# Patient Record
Sex: Female | Born: 1940
Health system: Southern US, Community
[De-identification: ages and names within clinical notes are randomized; demographics above are authoritative.]

## PROBLEM LIST (undated history)

## (undated) DIAGNOSIS — D649 Anemia, unspecified: Secondary | ICD-10-CM

## (undated) DIAGNOSIS — I639 Cerebral infarction, unspecified: Secondary | ICD-10-CM

## (undated) DIAGNOSIS — G2581 Restless legs syndrome: Secondary | ICD-10-CM

## (undated) DIAGNOSIS — N83209 Unspecified ovarian cyst, unspecified side: Secondary | ICD-10-CM

## (undated) DIAGNOSIS — I499 Cardiac arrhythmia, unspecified: Secondary | ICD-10-CM

## (undated) DIAGNOSIS — C50919 Malignant neoplasm of unspecified site of unspecified female breast: Secondary | ICD-10-CM

## (undated) DIAGNOSIS — H469 Unspecified optic neuritis: Secondary | ICD-10-CM

## (undated) DIAGNOSIS — S329XXA Fracture of unspecified parts of lumbosacral spine and pelvis, initial encounter for closed fracture: Secondary | ICD-10-CM

## (undated) DIAGNOSIS — Z8679 Personal history of other diseases of the circulatory system: Secondary | ICD-10-CM

## (undated) DIAGNOSIS — K219 Gastro-esophageal reflux disease without esophagitis: Secondary | ICD-10-CM

## (undated) DIAGNOSIS — E785 Hyperlipidemia, unspecified: Secondary | ICD-10-CM

## (undated) DIAGNOSIS — Z8719 Personal history of other diseases of the digestive system: Secondary | ICD-10-CM

## (undated) DIAGNOSIS — K449 Diaphragmatic hernia without obstruction or gangrene: Secondary | ICD-10-CM

## (undated) DIAGNOSIS — G629 Polyneuropathy, unspecified: Secondary | ICD-10-CM

## (undated) DIAGNOSIS — F329 Major depressive disorder, single episode, unspecified: Secondary | ICD-10-CM

## (undated) DIAGNOSIS — F32A Depression, unspecified: Secondary | ICD-10-CM

## (undated) DIAGNOSIS — G4762 Sleep related leg cramps: Secondary | ICD-10-CM

## (undated) DIAGNOSIS — D490 Neoplasm of unspecified behavior of digestive system: Secondary | ICD-10-CM

## (undated) DIAGNOSIS — I679 Cerebrovascular disease, unspecified: Secondary | ICD-10-CM

## (undated) DIAGNOSIS — M8580 Other specified disorders of bone density and structure, unspecified site: Secondary | ICD-10-CM

## (undated) DIAGNOSIS — B029 Zoster without complications: Secondary | ICD-10-CM

## (undated) HISTORY — DX: Hyperlipidemia, unspecified: E78.5

## (undated) HISTORY — DX: Personal history of other diseases of the circulatory system: Z86.79

## (undated) HISTORY — DX: Gastro-esophageal reflux disease without esophagitis: K21.9

## (undated) HISTORY — DX: Malignant neoplasm of unspecified site of unspecified female breast: C50.919

## (undated) HISTORY — PX: CATARACT EXTRACTION: SUR2

## (undated) HISTORY — DX: Major depressive disorder, single episode, unspecified: F32.9

## (undated) HISTORY — DX: Depression, unspecified: F32.A

## (undated) HISTORY — DX: Unspecified optic neuritis: H46.9

## (undated) HISTORY — DX: Anemia, unspecified: D64.9

## (undated) HISTORY — DX: Cardiac arrhythmia, unspecified: I49.9

## (undated) HISTORY — DX: Polyneuropathy, unspecified: G62.9

## (undated) HISTORY — DX: Cerebral infarction, unspecified: I63.9

## (undated) HISTORY — PX: APPENDECTOMY: SHX54

## (undated) HISTORY — DX: Neoplasm of unspecified behavior of digestive system: D49.0

## (undated) HISTORY — DX: Restless legs syndrome: G25.81

## (undated) HISTORY — DX: Fracture of unspecified parts of lumbosacral spine and pelvis, initial encounter for closed fracture: S32.9XXA

## (undated) HISTORY — PX: BREAST LUMPECTOMY: SHX2

## (undated) HISTORY — DX: Diaphragmatic hernia without obstruction or gangrene: K44.9

## (undated) HISTORY — DX: Unspecified ovarian cyst, unspecified side: N83.209

## (undated) HISTORY — DX: Personal history of other diseases of the digestive system: Z87.19

## (undated) HISTORY — PX: SPINE SURGERY: SHX786

## (undated) HISTORY — PX: ABDOMINAL HYSTERECTOMY: SHX81

## (undated) HISTORY — DX: Zoster without complications: B02.9

## (undated) HISTORY — PX: TOTAL HIP ARTHROPLASTY: SHX124

## (undated) HISTORY — DX: Other specified disorders of bone density and structure, unspecified site: M85.80

## (undated) HISTORY — DX: Sleep related leg cramps: G47.62

## (undated) HISTORY — PX: MASTECTOMY: SHX3

---
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
## 1998-05-01 DIAGNOSIS — B029 Zoster without complications: Secondary | ICD-10-CM

## 1998-05-01 HISTORY — DX: Zoster without complications: B02.9

## 1999-04-18 ENCOUNTER — Encounter (INDEPENDENT_AMBULATORY_CARE_PROVIDER_SITE_OTHER): Payer: Self-pay

## 1999-04-18 ENCOUNTER — Other Ambulatory Visit: Admission: RE | Admit: 1999-04-18 | Discharge: 1999-04-18 | Payer: Self-pay | Admitting: Radiology

## 1999-04-22 ENCOUNTER — Encounter: Payer: Self-pay | Admitting: General Surgery

## 1999-04-27 ENCOUNTER — Inpatient Hospital Stay (HOSPITAL_COMMUNITY): Admission: RE | Admit: 1999-04-27 | Discharge: 1999-04-29 | Payer: Self-pay | Admitting: General Surgery

## 1999-04-27 ENCOUNTER — Encounter (INDEPENDENT_AMBULATORY_CARE_PROVIDER_SITE_OTHER): Payer: Self-pay | Admitting: *Deleted

## 1999-07-08 ENCOUNTER — Ambulatory Visit (HOSPITAL_BASED_OUTPATIENT_CLINIC_OR_DEPARTMENT_OTHER): Admission: RE | Admit: 1999-07-08 | Discharge: 1999-07-08 | Payer: Self-pay | Admitting: Plastic Surgery

## 2000-03-15 ENCOUNTER — Ambulatory Visit (HOSPITAL_BASED_OUTPATIENT_CLINIC_OR_DEPARTMENT_OTHER): Admission: RE | Admit: 2000-03-15 | Discharge: 2000-03-15 | Payer: Self-pay | Admitting: General Surgery

## 2000-03-15 ENCOUNTER — Encounter (INDEPENDENT_AMBULATORY_CARE_PROVIDER_SITE_OTHER): Payer: Self-pay | Admitting: Specialist

## 2000-04-04 ENCOUNTER — Encounter: Payer: Self-pay | Admitting: Family Medicine

## 2000-04-04 ENCOUNTER — Ambulatory Visit (HOSPITAL_COMMUNITY): Admission: RE | Admit: 2000-04-04 | Discharge: 2000-04-04 | Payer: Self-pay | Admitting: Family Medicine

## 2000-04-17 ENCOUNTER — Encounter: Payer: Self-pay | Admitting: Oncology

## 2000-04-17 ENCOUNTER — Encounter: Admission: RE | Admit: 2000-04-17 | Discharge: 2000-04-17 | Payer: Self-pay | Admitting: Oncology

## 2000-04-20 ENCOUNTER — Encounter: Admission: RE | Admit: 2000-04-20 | Discharge: 2000-04-20 | Payer: Self-pay | Admitting: Oncology

## 2000-04-20 ENCOUNTER — Encounter: Payer: Self-pay | Admitting: Oncology

## 2000-07-23 ENCOUNTER — Encounter: Payer: Self-pay | Admitting: Emergency Medicine

## 2000-07-23 ENCOUNTER — Emergency Department (HOSPITAL_COMMUNITY): Admission: EM | Admit: 2000-07-23 | Discharge: 2000-07-23 | Payer: Self-pay | Admitting: Emergency Medicine

## 2000-08-24 ENCOUNTER — Encounter: Payer: Self-pay | Admitting: Oncology

## 2000-08-24 ENCOUNTER — Ambulatory Visit (HOSPITAL_COMMUNITY): Admission: RE | Admit: 2000-08-24 | Discharge: 2000-08-24 | Payer: Self-pay | Admitting: Oncology

## 2002-02-26 ENCOUNTER — Encounter: Payer: Self-pay | Admitting: Gastroenterology

## 2004-02-12 ENCOUNTER — Encounter: Admission: RE | Admit: 2004-02-12 | Discharge: 2004-02-12 | Payer: Self-pay | Admitting: General Surgery

## 2004-03-25 ENCOUNTER — Ambulatory Visit: Payer: Self-pay | Admitting: Family Medicine

## 2004-04-04 ENCOUNTER — Ambulatory Visit: Payer: Self-pay | Admitting: Family Medicine

## 2004-09-19 ENCOUNTER — Ambulatory Visit: Payer: Self-pay | Admitting: Family Medicine

## 2004-12-02 ENCOUNTER — Encounter: Admission: RE | Admit: 2004-12-02 | Discharge: 2004-12-02 | Payer: Self-pay | Admitting: Orthopedic Surgery

## 2005-04-28 ENCOUNTER — Encounter: Admission: RE | Admit: 2005-04-28 | Discharge: 2005-04-28 | Payer: Self-pay | Admitting: General Surgery

## 2005-04-28 ENCOUNTER — Encounter (INDEPENDENT_AMBULATORY_CARE_PROVIDER_SITE_OTHER): Payer: Self-pay | Admitting: Specialist

## 2006-04-18 ENCOUNTER — Ambulatory Visit: Payer: Self-pay | Admitting: Family Medicine

## 2006-04-30 ENCOUNTER — Ambulatory Visit (HOSPITAL_COMMUNITY): Admission: RE | Admit: 2006-04-30 | Discharge: 2006-04-30 | Payer: Self-pay | Admitting: Family Medicine

## 2006-05-21 ENCOUNTER — Ambulatory Visit: Payer: Self-pay | Admitting: Family Medicine

## 2006-05-21 LAB — CONVERTED CEMR LAB
Albumin: 3.8 g/dL (ref 3.5–5.2)
BUN: 14 mg/dL (ref 6–23)
Basophils Absolute: 0.1 10*3/uL (ref 0.0–0.1)
CO2: 31 meq/L (ref 19–32)
Chloride: 104 meq/L (ref 96–112)
Cholesterol: 270 mg/dL (ref 0–200)
Creatinine, Ser: 0.8 mg/dL (ref 0.4–1.2)
Direct LDL: 163 mg/dL
Eosinophils Relative: 4.5 % (ref 0.0–5.0)
HDL: 52.2 mg/dL (ref >39.0)
Hemoglobin: 14.2 g/dL (ref 12.0–15.0)
MCHC: 35.1 g/dL (ref 30.0–36.0)
MCV: 89.4 fL (ref 78.0–100.0)
Monocytes Absolute: 0.5 10*3/uL (ref 0.2–0.7)
Monocytes Relative: 8.1 % (ref 3.0–11.0)
Neutro Abs: 2.9 10*3/uL (ref 1.4–7.7)
Neutrophils Relative %: 46.2 % (ref 43.0–77.0)
RDW: 12.8 % (ref 11.5–14.6)
Sodium: 141 meq/L (ref 135–145)
Total Bilirubin: 0.8 mg/dL (ref 0.3–1.2)
Triglycerides: 245 mg/dL (ref 0–149)
VLDL: 49 mg/dL — ABNORMAL HIGH (ref 0–40)

## 2006-05-28 ENCOUNTER — Ambulatory Visit: Payer: Self-pay | Admitting: Family Medicine

## 2006-05-31 ENCOUNTER — Encounter: Admission: RE | Admit: 2006-05-31 | Discharge: 2006-07-20 | Payer: Self-pay | Admitting: Neurology

## 2006-07-24 ENCOUNTER — Ambulatory Visit: Payer: Self-pay | Admitting: Family Medicine

## 2006-07-24 LAB — CONVERTED CEMR LAB
Direct LDL: 136.4 mg/dL
Total CHOL/HDL Ratio: 3.8
Triglycerides: 164 mg/dL — ABNORMAL HIGH (ref 0–149)
VLDL: 33 mg/dL (ref 0–40)

## 2007-01-14 ENCOUNTER — Telehealth: Payer: Self-pay | Admitting: Family Medicine

## 2007-02-21 DIAGNOSIS — J157 Pneumonia due to Mycoplasma pneumoniae: Secondary | ICD-10-CM

## 2007-03-07 ENCOUNTER — Ambulatory Visit: Payer: Self-pay | Admitting: Family Medicine

## 2007-03-07 DIAGNOSIS — K219 Gastro-esophageal reflux disease without esophagitis: Secondary | ICD-10-CM

## 2007-03-07 DIAGNOSIS — F329 Major depressive disorder, single episode, unspecified: Secondary | ICD-10-CM

## 2007-08-01 ENCOUNTER — Ambulatory Visit: Payer: Self-pay | Admitting: Family Medicine

## 2007-08-01 ENCOUNTER — Telehealth: Payer: Self-pay | Admitting: Family Medicine

## 2007-08-01 DIAGNOSIS — N3000 Acute cystitis without hematuria: Secondary | ICD-10-CM | POA: Insufficient documentation

## 2007-08-01 DIAGNOSIS — Z853 Personal history of malignant neoplasm of breast: Secondary | ICD-10-CM

## 2007-08-01 LAB — CONVERTED CEMR LAB
Bilirubin Urine: NEGATIVE
Glucose, Urine, Semiquant: NEGATIVE
Protein, U semiquant: NEGATIVE
Specific Gravity, Urine: <1.005
pH: 6.5

## 2007-09-05 ENCOUNTER — Ambulatory Visit: Payer: Self-pay | Admitting: Family Medicine

## 2007-09-05 LAB — CONVERTED CEMR LAB
ALT: 20 units/L (ref 0–35)
Basophils Absolute: 0 10*3/uL (ref 0.0–0.1)
Basophils Relative: 0.9 % (ref 0.0–1.0)
Bilirubin Urine: NEGATIVE
CO2: 30 meq/L (ref 19–32)
Calcium: 9.6 mg/dL (ref 8.4–10.5)
Cholesterol: 215 mg/dL (ref 0–200)
Creatinine, Ser: 0.9 mg/dL (ref 0.4–1.2)
Direct LDL: 125.5 mg/dL
GFR calc Af Amer: 81 mL/min
Glucose, Bld: 86 mg/dL (ref 70–99)
Glucose, Urine, Semiquant: NEGATIVE
HCT: 38.4 % (ref 36.0–46.0)
Hemoglobin: 13.2 g/dL (ref 12.0–15.0)
Lymphocytes Relative: 45.5 % (ref 12.0–46.0)
MCHC: 34.4 g/dL (ref 30.0–36.0)
Monocytes Absolute: 0.5 10*3/uL (ref 0.1–1.0)
Monocytes Relative: 9.3 % (ref 3.0–12.0)
Neutro Abs: 2.1 10*3/uL (ref 1.4–7.7)
RBC: 4.14 M/uL (ref 3.87–5.11)
RDW: 12.8 % (ref 11.5–14.6)
Specific Gravity, Urine: 1.015
TSH: 1.54 microintl units/mL (ref 0.35–5.50)
Total Protein: 6.6 g/dL (ref 6.0–8.3)
VLDL: 25 mg/dL (ref 0–40)
WBC Urine, dipstick: NEGATIVE
pH: 5.5

## 2007-09-16 ENCOUNTER — Ambulatory Visit: Payer: Self-pay | Admitting: Family Medicine

## 2007-09-25 ENCOUNTER — Encounter: Payer: Self-pay | Admitting: Family Medicine

## 2007-10-03 ENCOUNTER — Ambulatory Visit: Payer: Self-pay | Admitting: Family Medicine

## 2007-10-05 DIAGNOSIS — D2360 Other benign neoplasm of skin of unspecified upper limb, including shoulder: Secondary | ICD-10-CM

## 2007-10-05 DIAGNOSIS — C44599 Other specified malignant neoplasm of skin of other part of trunk: Secondary | ICD-10-CM

## 2008-04-02 ENCOUNTER — Ambulatory Visit (HOSPITAL_COMMUNITY): Admission: RE | Admit: 2008-04-02 | Discharge: 2008-04-02 | Payer: Self-pay | Admitting: Family Medicine

## 2008-04-28 ENCOUNTER — Telehealth: Payer: Self-pay | Admitting: Family Medicine

## 2008-07-15 ENCOUNTER — Ambulatory Visit (HOSPITAL_COMMUNITY): Admission: RE | Admit: 2008-07-15 | Discharge: 2008-07-15 | Payer: Self-pay | Admitting: Neurology

## 2008-12-21 ENCOUNTER — Ambulatory Visit: Payer: Self-pay | Admitting: Family Medicine

## 2008-12-21 LAB — CONVERTED CEMR LAB
Glucose, Urine, Semiquant: NEGATIVE
Nitrite: NEGATIVE
Specific Gravity, Urine: 1.015
WBC Urine, dipstick: NEGATIVE

## 2009-01-11 ENCOUNTER — Encounter: Payer: Self-pay | Admitting: Family Medicine

## 2009-01-15 ENCOUNTER — Ambulatory Visit: Payer: Self-pay | Admitting: Family Medicine

## 2009-01-15 LAB — CONVERTED CEMR LAB
AST: 17 units/L (ref 0–37)
Albumin: 3.9 g/dL (ref 3.5–5.2)
Alkaline Phosphatase: 70 units/L (ref 39–117)
Basophils Relative: 0.1 % (ref 0.0–3.0)
CO2: 28 meq/L (ref 19–32)
Calcium: 9.2 mg/dL (ref 8.4–10.5)
Chloride: 105 meq/L (ref 96–112)
Eosinophils Absolute: 0.5 10*3/uL (ref 0.0–0.7)
HCT: 36.6 % (ref 36.0–46.0)
Hemoglobin: 12.7 g/dL (ref 12.0–15.0)
Lymphocytes Relative: 45.4 % (ref 12.0–46.0)
MCHC: 34.7 g/dL (ref 30.0–36.0)
Monocytes Relative: 7.6 % (ref 3.0–12.0)
Neutro Abs: 2.4 10*3/uL (ref 1.4–7.7)
Nitrite: NEGATIVE
Potassium: 4.6 meq/L (ref 3.5–5.1)
RBC: 4.02 M/uL (ref 3.87–5.11)
Sodium: 140 meq/L (ref 135–145)
Total CHOL/HDL Ratio: 7
Total Protein: 7.5 g/dL (ref 6.0–8.3)
Triglycerides: 172 mg/dL — ABNORMAL HIGH (ref 0.0–149.0)
Urobilinogen, UA: 0.2

## 2009-01-22 ENCOUNTER — Ambulatory Visit: Payer: Self-pay | Admitting: Family Medicine

## 2009-02-25 ENCOUNTER — Ambulatory Visit: Payer: Self-pay | Admitting: Family Medicine

## 2009-03-01 ENCOUNTER — Telehealth (INDEPENDENT_AMBULATORY_CARE_PROVIDER_SITE_OTHER): Payer: Self-pay | Admitting: *Deleted

## 2009-03-24 ENCOUNTER — Telehealth: Payer: Self-pay | Admitting: Family Medicine

## 2009-07-12 ENCOUNTER — Telehealth: Payer: Self-pay | Admitting: Gastroenterology

## 2009-07-13 ENCOUNTER — Ambulatory Visit: Payer: Self-pay | Admitting: Gastroenterology

## 2009-07-13 ENCOUNTER — Encounter: Payer: Self-pay | Admitting: Gastroenterology

## 2009-07-13 DIAGNOSIS — K5909 Other constipation: Secondary | ICD-10-CM | POA: Insufficient documentation

## 2009-07-13 DIAGNOSIS — R079 Chest pain, unspecified: Secondary | ICD-10-CM | POA: Insufficient documentation

## 2009-07-13 DIAGNOSIS — R1013 Epigastric pain: Secondary | ICD-10-CM

## 2009-07-13 DIAGNOSIS — G609 Hereditary and idiopathic neuropathy, unspecified: Secondary | ICD-10-CM

## 2009-07-13 LAB — CONVERTED CEMR LAB
Albumin: 4.1 g/dL (ref 3.5–5.2)
Basophils Relative: 1.1 % (ref 0.0–3.0)
Eosinophils Absolute: 0.4 10*3/uL (ref 0.0–0.7)
Hemoglobin: 13.5 g/dL (ref 12.0–15.0)
MCHC: 33.8 g/dL (ref 30.0–36.0)
MCV: 89.8 fL (ref 78.0–100.0)
Monocytes Absolute: 0.5 10*3/uL (ref 0.1–1.0)
Neutro Abs: 4.2 10*3/uL (ref 1.4–7.7)
RBC: 4.45 M/uL (ref 3.87–5.11)
Total Protein: 7.4 g/dL (ref 6.0–8.3)

## 2009-07-23 ENCOUNTER — Encounter: Payer: Self-pay | Admitting: Physician Assistant

## 2009-07-26 ENCOUNTER — Ambulatory Visit: Payer: Self-pay | Admitting: Gastroenterology

## 2009-07-26 LAB — CONVERTED CEMR LAB: UREASE: NEGATIVE

## 2009-07-27 ENCOUNTER — Telehealth (INDEPENDENT_AMBULATORY_CARE_PROVIDER_SITE_OTHER): Payer: Self-pay | Admitting: *Deleted

## 2009-07-28 ENCOUNTER — Encounter: Payer: Self-pay | Admitting: Gastroenterology

## 2009-08-19 ENCOUNTER — Encounter: Payer: Self-pay | Admitting: Gastroenterology

## 2009-08-26 ENCOUNTER — Encounter: Admission: RE | Admit: 2009-08-26 | Discharge: 2009-08-26 | Payer: Self-pay | Admitting: Surgery

## 2009-08-29 HISTORY — PX: HERNIA REPAIR: SHX51

## 2009-09-03 ENCOUNTER — Telehealth: Payer: Self-pay | Admitting: Gastroenterology

## 2009-09-07 ENCOUNTER — Inpatient Hospital Stay (HOSPITAL_COMMUNITY): Admission: RE | Admit: 2009-09-07 | Discharge: 2009-09-10 | Payer: Self-pay | Admitting: Surgery

## 2009-09-23 ENCOUNTER — Encounter: Payer: Self-pay | Admitting: Gastroenterology

## 2009-11-08 ENCOUNTER — Ambulatory Visit: Payer: Self-pay | Admitting: Family Medicine

## 2009-11-12 ENCOUNTER — Telehealth: Payer: Self-pay | Admitting: Family Medicine

## 2010-01-13 ENCOUNTER — Telehealth: Payer: Self-pay | Admitting: Family Medicine

## 2010-05-31 NOTE — Letter (Signed)
Summary: [facility] Instructions  [facility]  [address] [place], [place] [address]   Phone: [phone]  Fax: [phone]       Shelley Coleman    Sep 15, 1948    MRN: [id number]        Procedure Day /Date:MONDAY, 07/26/09     Arrival Time:1:30 PM     Procedure Time:2:30 PM     Location of Procedure:                    [facility] (4th Floor)                         PREPARATION FOR COLONOSCOPY WITH MOVIPREP   Starting 5 days prior to your procedure3/23/11do not eat nuts, seeds, popcorn, corn, beans, peas,  salads, or any raw vegetables.  Do not take any fiber supplements (e.g. Metamucil, Citrucel, and Benefiber).  THE DAY BEFORE YOUR PROCEDURE         DATE:07/25/09 TFT:DDUKGU  1.  Drink clear liquids the entire day-NO SOLID FOOD  2.  Do not drink anything colored red or purple.  Avoid juices with pulp.  No orange juice.  3.  Drink at least 64 oz. (8 glasses) of fluid/clear liquids during the day to prevent dehydration and help the prep work efficiently.  CLEAR LIQUIDS INCLUDE: Water Jello Ice Popsicles Tea (sugar ok, no milk/cream) Powdered fruit flavored drinks Coffee (sugar ok, no milk/cream) Gatorade Juice: apple, white grape, white cranberry  Lemonade Clear bullion, consomm, broth Carbonated beverages (any kind) Strained chicken noodle soup Hard Candy                             4.  In the morning, mix first dose of MoviPrep solution:    Empty 1 Pouch A and 1 Pouch B into the disposable container    Add lukewarm drinking water to the top line of the container. Mix to dissolve    Refrigerate (mixed solution should be used within 24 hrs)  5.  Begin drinking the prep at 5:00 p.m. The MoviPrep container is divided by 4 marks.   Every 15 minutes drink the solution down to the next mark (approximately 8 oz) until the full liter is complete.   6.  Follow completed prep with 16 oz of clear liquid of your choice (Nothing red or  purple).  Continue to drink clear liquids until bedtime.  7.  Before going to bed, mix second dose of MoviPrep solution:    Empty 1 Pouch A and 1 Pouch B into the disposable container    Add lukewarm drinking water to the top line of the container. Mix to dissolve    Refrigerate  THE DAY OF YOUR PROCEDURE      DATE: 07/26/09 DAY: MONDAY  Beginning at 9:30 a.m. (5 hours before procedure):         1. Every 15 minutes, drink the solution down to the next mark (approx 8 oz) until the full liter is complete.  2. Follow completed prep with 16 oz. of clear liquid of your choice.    3. You may drink clear liquids until 12:30 PM(2 HOURS BEFORE PROCEDURE).   MEDICATION INSTRUCTIONS  Unless otherwise instructed, you should take regular prescription medications with a small sip of water   as early as possible the morning of your procedure.  OTHER INSTRUCTIONS  You will need a responsible adult at least 68 years of age to accompany you and drive you home.   This person must remain in the waiting room during your procedure.  Wear loose fitting clothing that is easily removed.  Leave jewelry and other valuables at home.  However, you may wish to bring a book to read or  an iPod/MP3 player to listen to music as you wait for your procedure to start.  Remove all body piercing jewelry and leave at home.  Total time from sign-in until discharge is approximately 2-3 hours.  You should go home directly after your procedure and rest.  You can resume normal activities the  day after your procedure.  The day of your procedure you should not:   Drive   Make legal decisions   Operate machinery   Drink alcohol   Return to work  You will receive specific instructions about eating, activities and medications before you leave.    The above instructions have been reviewed and explained to me by   _______________________    I fully understand and can verbalize these  instructions _____________________________ Date _________

## 2010-05-31 NOTE — Procedures (Signed)
Summary: Colonoscopy  Patient: [name] Note: All result statuses are Final unless otherwise noted.  Tests: (1) Colonoscopy (COL)   COL Colonoscopy           DONE     [facility]     [address]     [place], [place]  [address]           COLONOSCOPY PROCEDURE REPORT           PATIENT:  Shelley Coleman, Shelley Coleman  MR#:  [id number]     BIRTHDATE:  06-Mar-1941, 69 yrs. old  GENDER:  female     ENDOSCOPIST:  Vania Rea. Jarold Motto, MD, [facility]     REF. BY:     PROCEDURE DATE:  07/26/2009     PROCEDURE:  Average-risk screening colonoscopy     G0121     ASA CLASS:  Class II     INDICATIONS:  abdominal pain, Routine Risk Screening     MEDICATIONS:   Fentanyl 100 mcg IV, Versed 10 mg IV, Benadryl 12.5     mg IV           DESCRIPTION OF PROCEDURE:   After the risks benefits and     alternatives of the procedure were thoroughly explained, informed     consent was obtained.  Digital rectal exam was performed and     revealed no abnormalities.   The LB [id number]-[id number] endoscope     was introduced through the anus and advanced to the cecum, which     was identified by both the appendix and ileocecal valve, limited     by a redundant colon, poor preparation.    The quality of the prep     was poor, using MoviPrep.  The instrument was then slowly     withdrawn as the colon was fully examined.     <<PROCEDUREIMAGES>>           FINDINGS:  Melanosis coli was found.  This was otherwise a normal     examination of the colon.  No polyps or cancers were seen.     Retroflexed views in the rectum revealed no abnormalities.    The     scope was then withdrawn from the patient and the procedure     completed.           COMPLICATIONS:  None     ENDOSCOPIC IMPRESSION:     1) Melanosis     2) Otherwise normal examination     3) No polyps or cancers     RECOMMENDATIONS:     1) high fiber diet     REPEAT EXAM:  No           ______________________________     Vania Rea. Jarold Motto, MD, Clementeen Graham           CC:   Roderick Pee, MD           n.     Rosalie Doctor:   Vania Rea. Wanette Robison at 07/26/2009 03:16 PM           Sherald Barge, [id number]  Note: An exclamation mark (!) indicates a result that was not dispersed into the flowsheet. Document Creation Date: 07/26/2009 3:17 PM _______________________________________________________________________  (1) Order result status: Final Collection or observation date-time: 07/26/2009 15:11 Requested date-time:  Receipt date-time:  Reported date-time:  Referring Physician:   Ordering Physician: Sheryn Bison [phone]) Specimen Source:  Source: Launa Grill Order Number: [id number] Lab site:

## 2010-05-31 NOTE — Letter (Signed)
Summary: Patient [facility] Biopsy Results  [facility]  [address] [place], [place] [address]   Phone: [phone]  Fax: [phone]        July 28, 2009 MRN: [id number]    [facility] [address] [place], [place]  [address]    Dear Ms. Nary,  I am pleased to inform you that the biopsies taken during your recent endoscopic examination did not show any evidence of cancer upon pathologic examination.  Additional information/recommendations:  __No further action is needed at this time.  Please follow-up with      your primary care physician for your other healthcare needs.  __ Please call [phone] to schedule a return visit to review      your condition.  X__ Continue with the treatment plan as outlined on the day of your      exam.  __ You should have a repeat endoscopic examination for this problem              in _ months/years.   Please call us if you are having persistent problems or have questions about your condition that have not been fully answered at this time.  Sincerely,  Mardella Layman MD [facility]  This letter has been electronically signed by your physician.  Appended Document: Patient Notice-Endo Biopsy Results letter mailed 4.1.11

## 2010-05-31 NOTE — Progress Notes (Signed)
Summary: triage  Phone Note Call from Patient Call back at Home Phone [phone]   Caller: Patient Call For: Dr. Jarold Motto Reason for Call: Talk to Nurse Summary of Call: pt reporting reflux, chest pressure, shoulder pain... care with pcp... would like to be worked in  Initial call taken by: Vallarie Mare,  July 12, 2009 1:49 PM  Follow-up for Phone Call        Talked with pt.  States she has been on nexium for a long time.  Does not feel like it is working.  For past few months having more indigestion ans nausea.  Several nights ago woke up with pain in chest.  Pain was relieved by belching.  Pt request Gi evaluation.  Appt sch with Rozetta Nunnery NP for tomorrow.  Pt instructed if chest pain returned, SOB develops pt needs to go to the ER.  Pt states she understands. Follow-up by: Ashok Cordia RN,  July 12, 2009 2:09 PM

## 2010-05-31 NOTE — Progress Notes (Signed)
Summary: Referral to [facility]  Phone Note Outgoing Call   Call placed by: Ashok Cordia RN,  July 27, 2009 8:33 AM Summary of Call: Pt needs referral to Dr. Zenon Mayo at [facility] for possible fundoplication per Dr. Jarold Motto.  Appt sch for April 21 at 11:15.  records faxed. Initial call taken by: Ashok Cordia RN,  July 27, 2009 8:34 AM  Follow-up for Phone Call        Pt notified of appt. Follow-up by: Ashok Cordia RN,  July 27, 2009 9:23 AM

## 2010-05-31 NOTE — Progress Notes (Signed)
Summary: nitro clarification  Phone Note Refill Request Message from:  Fax from Pharmacy on November 12, 2009 5:13 PM     Prescriptions: NITROSTAT 0.3 MG SUBL (NITROGLYCERIN) 1 sl for severe gerd  #100 x 1   Entered by:   [name] CMA (AAMA)   Authorized by:   Roderick Pee MD   Signed by:   [name] CMA (AAMA) on 11/12/2009   Method used:   Electronically to        [facility]  [phone]* (retail)       [address]       [place], [place]  [address]       Ph: [phone] or [phone]       Fax: [phone]   RxID:   [id number]

## 2010-05-31 NOTE — Assessment & Plan Note (Signed)
Summary: CP EPISODES AFTER REFLUX SURGERY MAY 10/SEE dR. tODD PER dR. ...   Vital Signs:  Patient profile:   70 year old female Menstrual status:  hysterectomy Weight:      181 pounds Temp:     98.0 degrees F oral BP sitting:   130 / 92  (left arm) Cuff size:   regular  Vitals Entered By: [name] CMA Duncan Dull) (November 08, 2009 11:25 AM) CC: follow-up visit after surgery, chest pain   Primary Care Provider:  Kelle Darting, MD  CC:  follow-up visit after surgery and chest pain.  History of Present Illness: Shelley Coleman is a 70 year old, married female, retired Engineer, civil (consulting), who comes in today for evaluation of chest pain.  She had surgery on May the 10th because of severe reflux esophagitis and a hiatal hernia.  By Dr. Daphine Deutscher on referral from Dr. Jarold Motto.  She did well and had no complications.  Since his surgery.  She's been having intermittent chest pain.  She points to the lower mid sternal area.  This is the source of her discomfort.  Is not related to eating walking and lying down, etc..  She describes it as a dull, sometimes sharp pain it comes and goes.  It may last for 20 minutes, and they last for an hour.  She states on a scale of one to 10 as a 10.  It radiates up into her neck.  She has no cardiac or pulmonary symptoms.  Family history reviewed.  Father had coronary artery disease.  Again, the cardiopulmonary review of systems negative.  She takes a combination of Aleve and Advil for neuropathy, etiology unknown.  Allergies: 1)  ! * Tetanus 2)  ! Dolobid  Past History:  Past medical, surgical, family and social histories (including risk factors) reviewed, and no changes noted (except as noted below).  Past Medical History: Reviewed history from 08/01/2007 and no changes required. Depression GERD Hyperlipidemia radiculopathy M. vp childbirth x 2 Breast cancer, hx of appendectomy ovarian cyst right parotid tumor TAH history of melanoma  Past Surgical  History: Reviewed history from 07/13/2009 and no changes required. Hysterectomy  Family History: Reviewed history from 07/13/2009 and no changes required. father died at 32, MI, CVA, smoker, a blood pressure mother died 69 from a sarcoma no sisters two brothers one has either hernia and hypertension.  The other set his gallbladder removed, and has also high blood pressure. No FH of Colon Cancer:  Social History: Reviewed history from 07/13/2009 and no changes required. Occupation:RN Retired  Married Never Smoked Alcohol use-no Drug use-no Regular exercise-no Daily Caffeine Use  4 cups   Review of Systems      See HPI  Physical Exam  General:  Well-developed,well-nourished,in no acute distress; alert,appropriate and cooperative throughout examination Neck:  No deformities, masses, or tenderness noted. Chest Wall:  No deformities, masses, or tenderness noted. Lungs:  Normal respiratory effort, chest expands symmetrically. Lungs are clear to auscultation, no crackles or wheezes. Heart:  Normal rate and regular rhythm. S1 and S2 normal without gallop, murmur, click, rub or other extra sounds.   Impression & Recommendations:  Problem # 1:  CHEST PAIN (ICD-786.50) Assessment New  Complete Medication List: 1)  Hydrochlorothiazide 25 Mg Tabs (Hydrochlorothiazide) .... Take one tablet daily 2)  Atenolol 50 Mg Tabs (Atenolol) .... Take one tablet daily as needed 3)  Hydrocodone-acetaminophen 5-500 Mg Tabs (Hydrocodone-acetaminophen) .... Take one tablet every 4-6 hours as needed 4)  Aleve 220 Mg Tabs (Naproxen sodium) .Marland KitchenMarland KitchenMarland Kitchen  Take two tablets as needed 5)  Advil 200 Mg Tabs (Ibuprofen) .... Take four tablets  as needed 6)  Requip Xl 2 Mg Xr24h-tab (Ropinirole hcl) .... Use as directed 7)  Multi-day Plus Iron Tabs (Multiple vitamins-iron) .... Once daily 8)  Omega-3 Fish Oil 1000 Mg Caps (Omega-3 fatty acids) .... Take one tab three times a day 9)  Cymbalta 30 Mg Cpep (Duloxetine  hcl) .Marland Kitchen.. 1 tablet by mouth once daily 10)  Vitamin D 1000 Unit Tabs (Cholecalciferol) .... Take one by mouth once daily 11)  Vitamin C 500 Mg Tabs (Ascorbic acid) .... Take by mouth once daily 12)  Nitrostat 0.3 Mg Subl (Nitroglycerin) .Marland Kitchen.. 1 sl for severe gerd  Other Orders: EKG w/ Interpretation (93000)  Patient Instructions: 1)  stop all the anti-inflammatory medications only take Tylenol.......Marland Kitchen Vicodin one half tablet q.i.d., p.r.n. for pain......... OTC omeprazole 20 mg before breakfast and 20 mg prior to evening meal. 2)  If symptoms persist, then we will need to get Dr. Jarold Motto back involved Prescriptions: NITROSTAT 0.3 MG SUBL (NITROGLYCERIN) 1 sl for severe gerd  #25 x 1   Entered and Authorized by:   Roderick Pee MD   Signed by:   Roderick Pee MD on 11/08/2009   Method used:   Print then Give to Patient   RxID:   [id number]

## 2010-05-31 NOTE — Procedures (Signed)
Summary: Upper Endoscopy  Patient: [name] Note: All result statuses are Final unless otherwise noted.  Tests: (1) Upper Endoscopy (EGD)   EGD Upper Endoscopy       DONE     [facility]     [address]     [place], [place]  [address]           ENDOSCOPY PROCEDURE REPORT           PATIENT:  Shelley Coleman, Shelley Coleman  MR#:  [id number]     BIRTHDATE:  1940/07/19, 69 yrs. old  GENDER:  female           ENDOSCOPIST:  Vania Rea. Jarold Motto, MD, [facility]     Referred by:           PROCEDURE DATE:  07/26/2009     PROCEDURE:  EGD with biopsy     ASA CLASS:  Class II     INDICATIONS:  GERD, abdominal pain           MEDICATIONS:   There was residual sedation effect present from     prior procedure.     TOPICAL ANESTHETIC:           DESCRIPTION OF PROCEDURE:   After the risks benefits and     alternatives of the procedure were thoroughly explained, informed     consent was obtained.  The [facility] GIF-H180 [id number] endoscope was     introduced through the mouth and advanced to the second portion of     the duodenum, without limitations.  The instrument was slowly     withdrawn as the mucosa was fully examined.     <<PROCEDUREIMAGES>>           A hiatal hernia was found. Large hh and chronic GERD.  The     duodenal bulb was normal in appearance, as was the postbulbar     duodenum.  The esophagus and gastroesophageal junction were     completely normal in appearance.  The stomach was entered and     closely examined. The antrum, angularis, and lesser curvature were     well visualized, including a retroflexed view of the cardia and     fundus. The stomach wall was normally distensable. The scope     passed easily through the pylorus into the duodenum.     Retroflexed views revealed a hiatal hernia.    The scope was then     withdrawn from the patient and the procedure completed.           COMPLICATIONS:  None           ENDOSCOPIC IMPRESSION:     1) Hiatal hernia     2) Normal duodenum     3)  Normal esophagus     4) Normal stomach     5) A hiatal hernia     small bowel and clo bx. done.     RECOMMENDATIONS:     1) Anti-reflux regimen to be follow     2) Await biopsy results     CONSIDER FUNDOPLICATION.DEXILANT 60 MG/DAY TRIAL.           REPEAT EXAM:  No           ______________________________     Vania Rea. Jarold Motto, MD, Clementeen Graham           CC:  Roderick Pee, MD, Luretha Murphy, MD           n.  eSIGNED:   Vania Rea. Patterson at 07/26/2009 03:29 PM           Sherald Barge, [id number]  Note: An exclamation mark (!) indicates a result that was not dispersed into the flowsheet. Document Creation Date: 07/26/2009 3:30 PM _______________________________________________________________________  (1) Order result status: Final Collection or observation date-time: 07/26/2009 15:21 Requested date-time:  Receipt date-time:  Reported date-time:  Referring Physician:   Ordering Physician: Sheryn Bison [phone]) Specimen Source:  Source: Launa Grill Order Number: [id number] Lab site:

## 2010-05-31 NOTE — Miscellaneous (Signed)
Summary: clotest  Clinical Lists Changes  Orders: Added new Test order of TLB-H Pylori Screen Gastric Biopsy (83013-CLOTEST) - Signed 

## 2010-05-31 NOTE — Letter (Signed)
Summary: [facility]   Imported By: Sherian Rein 10/20/2009 14:07:07  _____________________________________________________________________  External Attachment:    Type:   Image     Comment:   External Document

## 2010-05-31 NOTE — Progress Notes (Signed)
Summary: samples  Phone Note Call from Patient Call back at Home Phone [phone]   Caller: Patient Call For: Jarold Motto Reason for Call: Talk to Nurse Summary of Call: Patient would like some samples of Dexilant until her Surgery Tues, Initial call taken by: Tawni Levy,  Sep 03, 2009 11:07 AM  Follow-up for Phone Call        Talked with pt.  She has been taking nexium but it is not halping.  Would like to have some dexilant, which has been helpful in the past, to use until she has fundoplication in near future.  Samples given. Follow-up by: Ashok Cordia RN,  Sep 03, 2009 2:20 PM    New/Updated Medications: DEXILANT 60 MG CPDR (DEXLANSOPRAZOLE) qd

## 2010-05-31 NOTE — Procedures (Signed)
Summary: [facility] EGD  Patient Name: Shelley Coleman, Shelley Coleman MRN:  Procedure Procedures: Panendoscopy (EGD) CPT: 43235.    with biopsy(s)/brushing(s). CPT: D1846139.  Personnel: Endoscopist: Vania Rea. Jarold Motto, MD.  Exam Location: Exam performed in Outpatient Clinic. Outpatient  Patient Consent: Procedure, Alternatives, Risks and Benefits discussed, consent obtained, from patient. Consent was obtained by the RN.  Indications Symptoms: Abdominal pain, location: epigastric. Reflux symptoms  History  Pre-Exam Physical: Performed Feb 26, 2002  Cardio-pulmonary exam, Abdominal exam, Extremity exam, Mental status exam WNL.  Exam Exam Info: Maximum depth of insertion Duodenum, intended Duodenum. Patient position: on left side. Duration of exam: 15 minutes. Vocal cords visualized. Images taken. ASA Classification: I. Tolerance: excellent.  Sedation Meds: Patient assessed and found to be appropriate for moderate (conscious) sedation. Fentanyl 100 mcg. given IV. Versed 7 mg. given IV. Cetacaine Spray 2 sprays given aerosolized.  Monitoring: BP and pulse monitoring done. Oximetry used. Supplemental O2 given at 2 Liters.  Instrument(s): PFC 160L. Serial [id number].   Findings - HIATAL HERNIA: Prolapsing, 4 cms. in length. ICD9: Hernia, Hiatal: 553.3. - ESOPHAGEAL INFLAMMATION: as a result of reflux. Severity is mild, erythema only.  Length of inflammation: 3 cm. Edema present. Biopsy/Esoph Inflamtn taken. ICD9: Esophagitis, Reflux: 530.11. Comments: Irregular Z-line biopsied to exclude Barret's mucosa.  - DIAGNOSTIC TEST: from Body to Antrum. RUT done, results pending   Assessment  Diagnoses: 553.3: Hernia, Hiatal. GERD.  530.11: Esophagitis, Reflux.   Events  Unplanned Intervention: No unplanned interventions were required.  Plans Medication(s): PPI: Esomeprazole/Nexium 40 mg QAM, starting Feb 26, 2002 for indefinitely.   Patient Education: Patient given standard instructions for:  Reflux.  Disposition: After procedure patient sent to recovery. After recovery patient sent home.  Scheduling: Colonoscopy, to Vania Rea. Jarold Motto, MD, around Mar 12, 2002.    This report was created from the original endoscopy report, which was reviewed and signed by the above listed endoscopist.

## 2010-05-31 NOTE — Letter (Signed)
Summary: [facility]   Imported By: Sherian Rein 09/13/2009 08:25:08  _____________________________________________________________________  External Attachment:    Type:   Image     Comment:   External Document

## 2010-05-31 NOTE — Assessment & Plan Note (Signed)
Summary: Reflux, nausea/dfs   History of Present Illness Visit Type: Initial Visit Primary GI MD: Melvia Heaps MD [facility] Primary Provider: Kelle Darting, MD Chief Complaint: indigestion and constipation  with alternate diarrhea History of Present Illness:   Last seen here eight years ago. History of GERD, was asymptomatic on Nexium for years but lately having reflux and belching. For a couple of months has been having indigestion in the evenings. Pain in epigastrium, non-radiating. It is not related to eating or any other activity. She has added OTC antacids to PPI and that helps. A few nights ago woke up with chest pain. No associated SOB. Her heartrate was 110. She gives a history of PVCs. After belching a couple of times her pain resolved. Next day she didn't feel well and was slightly nauseated.   Has history of breast cancer. Had a lot of side effects from chemo including peripheral neuropathy. She has chronic leg and back pain, takes a lot of Advil and Aleve.   Chronic constipation with ocassional diarrhea. Believes constipation related to medications. Tries not to take anything other than Miralax as needed.       GI Review of Systems    Reports abdominal pain, acid reflux, belching, bloating, chest pain, and  nausea.      Denies dysphagia with liquids, dysphagia with solids, heartburn, loss of appetite, vomiting, vomiting blood, weight loss, and  weight gain.      Reports constipation.     Denies anal fissure, black tarry stools, change in bowel habit, diarrhea, diverticulosis, fecal incontinence, heme positive stool, hemorrhoids, irritable bowel syndrome, jaundice, light color stool, liver problems, rectal bleeding, and  rectal pain.    Current Medications (verified): 1)  Nexium 40 Mg Cpdr (Esomeprazole Magnesium) .... Take One Tablet Daily 2)  Hydrochlorothiazide 25 Mg  Tabs (Hydrochlorothiazide) .... Take One Tablet Daily 3)  Atenolol 50 Mg  Tabs (Atenolol) .... Take One Tablet  Daily As Needed 4)  Hydrocodone-Acetaminophen 5-500 Mg  Tabs (Hydrocodone-Acetaminophen) .... Take One Tablet Every 4-6 Hours As Needed 5)  Aleve 220 Mg  Tabs (Naproxen Sodium) .... Take Two Tablets As Needed 6)  Advil 200 Mg  Tabs (Ibuprofen) .... Take Four Tablets  As Needed 7)  Requip Xl 2 Mg Xr24h-Tab (Ropinirole Hcl) .... Use As Directed 8)  Multi-Day Plus Iron  Tabs (Multiple Vitamins-Iron) .... Once Daily 9)  Omega-3 Fish Oil 1000 Mg Caps (Omega-3 Fatty Acids) .... Take One Tab Three Times A Day 10)  Cymbalta 30 Mg Cpep (Duloxetine Hcl) .Marland Kitchen.. 1 Tablet By Mouth Once Daily 11)  Vitamin D 1000 Unit Tabs (Cholecalciferol) .... Take One By Mouth Once Daily 12)  Vitamin C 500 Mg Tabs (Ascorbic Acid) .... Take By Mouth Once Daily  Allergies (verified): 1)  ! * Tetanus 2)  ! Dolobid  Past History:  Past Medical History: Reviewed history from 08/01/2007 and no changes required. Depression GERD Hyperlipidemia radiculopathy M. vp childbirth x 2 Breast cancer, hx of appendectomy ovarian cyst right parotid tumor TAH history of melanoma  Past Surgical History: Hysterectomy  Family History: Reviewed history from 01/22/2009 and no changes required. father died at 43, MI, CVA, smoker, a blood pressure mother died 4 from a sarcoma no sisters two brothers one has either hernia and hypertension.  The other set his gallbladder removed, and has also high blood pressure. No FH of Colon Cancer:  Social History: Reviewed history from 03/07/2007 and no changes required. Occupation:RN Retired  Married Never Smoked Alcohol use-no Drug  use-no Regular exercise-no Daily Caffeine Use  4 cups   Review of Systems       The patient complains of anxiety-new, arthritis/joint pain, back pain, blood in urine, depression-new, fatigue, heart rhythm changes, muscle pains/cramps, night sweats, sleeping problems, swelling of feet/legs, and urine leakage.  The patient denies allergy/sinus, anemia,  breast changes/lumps, change in vision, confusion, cough, coughing up blood, fainting, fever, headaches-new, hearing problems, heart murmur, itching, nosebleeds, shortness of breath, skin rash, sore throat, swollen lymph glands, thirst - excessive, urination - excessive, urination changes/pain, vision changes, and voice change.    Vital Signs:  Patient profile:   70 year old female Menstrual status:  hysterectomy Height:      67.5 inches Weight:      185 pounds BMI:     28.65 Pulse rate:   68 / minute Pulse rhythm:   regular BP sitting:   138 / 80  (left arm)  Vitals Entered By: Milford Cage NCMA (July 13, 2009 10:31 AM)  Physical Exam  General:  Well developed, well nourished, no acute distress. Head:  Normocephalic and atraumatic. Eyes:  Conjunctiva pink, no icterus.  Mouth:  No oral lesions. Tongue moist.  Neck:  no obvious masses  Lungs:  Clear throughout to auscultation. Heart:  Regular rate and rhythm; no murmurs, rubs,  or bruits. Abdomen:  Abdomen soft, nontender, nondistended. No obvious masses or hepatomegaly.Normal bowel sounds.  Msk:  Symmetrical with no gross deformities. Normal posture. Extremities:  No palmar erythema, no edema.  Neurologic:  Alert and  oriented x4;  grossly normal neurologically. Skin:  Intact without significant lesions or rashes. Cervical Nodes:  No significant cervical adenopathy. Psych:  Alert and cooperative. Normal mood and affect.   Impression & Recommendations:  Problem # 1:  GERD (ICD-530.81) Assessment Deteriorated HIstory of mild esophagitis 2003. On chronic PPI. Give two month history of heartburn /  "indigestion", mainly in the evenings. It has been eight years since her last endoscopy, she is having breakthrough symptoms on PPI. For further evalualtion will obtain basic labs. the patient will be scheduled for an EGD with biopsies ( if indicated).  The risks and benefits of the procedure, as well as alternatives were discussed  with the patient and she agrees to proceed.  Problem # 2:  BREAST CANCER, HX OF (ICD-V10.3)  Problem # 3:  DEPRESSION (ICD-311) Assessment: Comment Only Gives history of multiple tragedies in her life.  Problem # 4:  SCREENING COLORECTAL-CANCER (ICD-V76.51) Assessment: Comment Only Last colonoscopy 1997- tortuous colon, possibly "bound down".  Since then patient has had additional abdominal surgeries which may make colonoscopy even more difficult but she needs screening.  The patient will be scheduled for a colonoscopy with biopsies/polypectomy (if indicated).  The risks and benefits of the procedure, as well as alternatives were discussed with the patient and she agrees to proceed.   Patient on chronic pain medication. She may need to have procedures done at [facility] under Propofol.   Orders: Colon/Endo (Colon/Endo)  Problem # 5:  PERIPHERAL NEUROPATHY (ICD-356.9) Assessment: Comment Only In addition to prescription meds, takes several Advil, Aleve a week. I have asked her to cut back prior to procedure.  Other Orders: TLB-CBC Platelet - w/Differential (85025-CBCD) TLB-Hepatic/Liver Function Pnl (80076-HEPATIC) TLB-Lipase (83690-LIPASE)  Patient Instructions: 1)  We have scheduled the Endoscopy/ Colonoscopy with Dr. Jarold Motto on 07-26-09.  2)  [facility] Patient Information Guide given to patient. 3)  Endoscopy and Colonoscopy brochures given. 4)  Starting on  07-19-09 stay off Aleve and Advil until after procedures. 5)  Copy sent to : Kelle Darting, MD 6)  Labs ordered for patient to have drawn today in the basement floor. 7)  The medication list was reviewed and reconciled.  All changed / newly prescribed medications were explained.  A complete medication list was provided to the patient / caregiver.

## 2010-05-31 NOTE — Miscellaneous (Signed)
Summary: RX MOVIPREP  Clinical Lists Changes  Medications: Added new medication of MOVIPREP 100 GM  SOLR (PEG-KCL-NACL-NASULF-NA ASC-C) As per prep instructions. - Signed Rx of MOVIPREP 100 GM  SOLR (PEG-KCL-NACL-NASULF-NA ASC-C) As per prep instructions.;  #1 x 0;  Signed;  Entered by: Lowry Ram NCMA;  Authorized by: Sammuel Cooper PA-c;  Method used: Electronically to [facility]  [phone]*, [address] [place], [place], [place]  [address], Ph: [phone] or [phone], Fax: [phone]    Prescriptions: MOVIPREP 100 GM  SOLR (PEG-KCL-NACL-NASULF-NA ASC-C) As per prep instructions.  #1 x 0   Entered by:   Lowry Ram NCMA   Authorized by:   Sammuel Cooper PA-c   Signed by:   Lowry Ram NCMA on 07/23/2009   Method used:   Electronically to        [facility]  [phone]* (retail)       [address]       [place], [place]  [address]       Ph: [phone] or [phone]       Fax: [phone]   RxID:   [id number]

## 2010-05-31 NOTE — Progress Notes (Signed)
Summary: UTI  Phone Note Call from Patient   Caller: Patient Call For: Roderick Pee MD Summary of Call: Shelley Coleman ([place]) Needs Cipro refill for a current UTI.....Marland Kitchenstates Dr. Tawanna Cooler gives her meds to keep on hand for UTIs. [phone] Initial call taken by: Lynann Beaver CMA,  January 13, 2010 10:58 AM  Follow-up for Phone Call        Cipro 250 mg, dispense 20 tabs, directions one p.o. b.i.d. the bottle empty for cystitis.  No refills........ if symptoms do not resolve after a couple days.  She needs an office visit Follow-up by: Roderick Pee MD,  January 13, 2010 11:47 AM    New/Updated Medications: CIPRO 250 MG TABS (CIPROFLOXACIN HCL) one by mouth two times a day until bottle empty Prescriptions: CIPRO 250 MG TABS (CIPROFLOXACIN HCL) one by mouth two times a day until bottle empty  #20 x 0   Entered by:   Lynann Beaver CMA   Authorized by:   Roderick Pee MD   Signed by:   Lynann Beaver CMA on 01/13/2010   Method used:   Electronically to        [facility]  [phone]* (retail)       [address]       [place], [place]  [address]       Ph: [phone] or [phone]       Fax: [phone]   RxID:   [id number]  Notified pt.

## 2010-07-19 ENCOUNTER — Other Ambulatory Visit: Payer: Self-pay | Admitting: Family Medicine

## 2010-07-19 LAB — BASIC METABOLIC PANEL
CO2: 30 mEq/L (ref 19–32)
Calcium: 9.9 mg/dL (ref 8.4–10.5)
Creatinine, Ser: 0.76 mg/dL (ref 0.4–1.2)
GFR calc Af Amer: >60 mL/min (ref >60)

## 2010-07-19 LAB — URINALYSIS, ROUTINE W REFLEX MICROSCOPIC
Glucose, UA: NEGATIVE mg/dL
Ketones, ur: NEGATIVE mg/dL
Specific Gravity, Urine: 1.018 (ref 1.005–1.030)
pH: 6.5 (ref 5.0–8.0)

## 2010-07-19 LAB — CBC
HCT: 32.5 % — ABNORMAL LOW (ref 36.0–46.0)
Hemoglobin: 11.1 g/dL — ABNORMAL LOW (ref 12.0–15.0)
MCHC: 33.8 g/dL (ref 30.0–36.0)
MCHC: 33.8 g/dL (ref 30.0–36.0)
MCV: 90.1 fL (ref 78.0–100.0)
MCV: 90.6 fL (ref 78.0–100.0)
MCV: 90.9 fL (ref 78.0–100.0)
Platelets: 200 10*3/uL (ref 150–400)
Platelets: 288 10*3/uL (ref 150–400)
RBC: 3.68 MIL/uL — ABNORMAL LOW (ref 3.87–5.11)
RBC: 4.56 MIL/uL (ref 3.87–5.11)
RDW: 14.6 % (ref 11.5–15.5)
WBC: 6.4 10*3/uL (ref 4.0–10.5)
WBC: 6.8 10*3/uL (ref 4.0–10.5)
WBC: 7.4 10*3/uL (ref 4.0–10.5)

## 2010-07-19 LAB — URINE CULTURE: Special Requests: POSITIVE

## 2010-07-19 LAB — DIFFERENTIAL
Basophils Absolute: 0.1 10*3/uL (ref 0.0–0.1)
Basophils Relative: 1 % (ref 0–1)
Eosinophils Absolute: 0.6 10*3/uL (ref 0.0–0.7)
Monocytes Absolute: 0.7 10*3/uL (ref 0.1–1.0)
Monocytes Relative: 8 % (ref 3–12)
Neutro Abs: 4.9 10*3/uL (ref 1.7–7.7)
Neutrophils Relative %: 57 % (ref 43–77)

## 2010-07-19 LAB — URINE MICROSCOPIC-ADD ON

## 2010-09-16 NOTE — Op Note (Signed)
[place]. [facility]  Patient:    Shelley Coleman                         MRN: [id number] Proc. Date: 04/27/99 Adm. Date:  [id number] Attending:  Janalyn Rouse                           Operative Report  PREOPERATIVE DIAGNOSIS:  Right breast carcinoma.  POSTOPERATIVE DIAGNOSIS:  Right breast carcinoma.  PROCEDURE:  Immediate breast reconstruction following mastectomy utilizing a contralateral transverse rectus abdominis myocutaneous flap.  SURGEON:  Alfredia Ferguson, M.D.  ASSISTANT:  Dr. Dan Humphreys.  ANESTHESIA:  General endotracheal anesthesia.  INDICATIONS:  This is a 70 year old woman with diffuse intraductal carcinoma in  site right breast.  She has opted to undergo a right mastectomy.  She wishes to  undergo immediate reconstruction following the mastectomy.  She was counselled t length in the office regarding the potential risks of the surgery.  These risks  included complete or partial loss of the flap, fat necrosis, asymmetry, wound healing difficulties, bleeding requiring transfusion, infection requiring the need for secondary surgery, vascular compromise to the umbilicus, umbilicus being off the midline, unsightly scarring of the abdominal incision or breast incision, abdominal hernias, bulges, or asymmetries, chronic pain, numbness of the abdominal skin, and overall dissatisfaction with the reconstructive results.  Inspite of these and other risks discussed with the patient, she wishes to proceed with the operation.  DESCRIPTION OF PROCEDURE:  On the day prior to surgery, skin marks had been placed outlining the dimensions of the skin paddle.  At the conclusion of the mastectomy, I was summoned to the operating room.  The mastectomy site was inspected.  A 10 mm Blake drain was placed in the lateral axillary gutter.  No axillary dissection ad been performed.  A tunnel was begun in the inferomedial portion of  the mastectomy defect, dissecting approximately 8 or 9 cm inferiorly towards the left rectus muscle.  Following the placement of this, the beginnings of the tunnel, a moist lap was placed in the mastectomy defect and attention was directed to the abdomen. A "shield" shaped incision was made around the umbilicus and the umbilicus was dissected away from the skin paddle leaving a healthy cuff of fat around the umbilical stalk to preserve vascular integrity.  The upper portion of the incision and the skin paddle was now made and deepened through the skin and subcutaneous  tissue until reaching the anterior abdominal wall fascia.  The abdominal wall flap was now elevated off of the anterior rectus fascia up to the level of the costal margins bilaterally and the xiphoid in the midline.   The dissection was continued until I reached my previously placed tunnel from the mastectomy defect.  This allowed access from the abdomen to the mastectomy in order to place my TRAM flap. The patients back was now elevated approximately 30 degrees to ensure that abdominal closure could be accomplished without any undue tension.  Once I was certain that it could be closed at the location of my skin mark, she was placed  back in a supine position.  An incision was now made at the previously made inferior skin mark of the skin paddle and deepened until reaching the anterior abdominal wall fascia.  I opted to use a contralateral rectus muscle because the patient had a paramedian incision on the right side.  The right corner of the skin paddle was now elevated from a lateral to medial direction elevating it off the  anterior abdominal wall fascia until reaching the linea alba.  The remainder of the umbilical stalk was dissected away from the skin paddle.  Carefully, I dissected across the midline approximately 1 cm to the left of the midline.  The left corner of the skin paddle was now elevated from a  lateral to medial direction until reaching the lateral rectus perforators, which were approximately 3 cm medial to the lateral rectus border.  No lateral rectus perforators were disturbed.  Two parallel incisions were made in the anterior rectus fascia beginning on the costal margin superiorly.  The lateral incision was carried inferiorly skirting along he lateral connection of the skin paddle to the anterior rectus fascia on the left  side.  Once reaching the inferior connection of the skin paddle to the anterior  rectus fascia, the incision was curved in a medial direction toward the medial kin paddle connection to the anterior rectus fascia.  Beginning again at the costal  margin, the medial anterior rectus fascia was continued in an inferior direction skirting along the medial skin paddle connection to the anterior rectus fascia until reaching the previously placed anterior rectus fascia incision inferiorly. The anterior rectus muscle was now dissected out of its anatomic bed leaving a strip of rectus fascia over the anterior rectus muscle from the costal margins ll the way through the skin paddle.  Once the anterior portion of the rectus fascia had been freed, the deep portion of the rectus fascia was freed off the posterior rectus fascia.  The deep inferior epigastric vessels were visualized inferiorly. The rectus muscle was now divided using electrocautery at approximately the level of the arcuate line.  The recipient site of the TRAM flap was now once again inspected and irrigated.  Hemostasis was assured using electrocautery.  The deep inferior epigastric vessels were now divided, first dividing the deep inferior epigastric artery between hemoclips.  The two veins were divided in a similar fashion.  Zone 4 of the skin paddle was excised.  The left corner of the skin paddle was also excised.  There was excellent bleeding coming from both sides.  Skin paddle along with  the connected rectus muscle was now tunnelled up to the mastectomy defect and temporarily stapled in position.  The abdominal wound was  copiously irrigated with saline irrigation.  Hemostasis was ensured using electrocautery.  The anterior rectus fascia was now closed using multiple  interrupted, buried figure-of-eight 0 Prolene sutures.  Umbilicus was pulled to the left of midline with this closure.  Marlex mesh was placed over the rectus closure as an onlay reinforcement.  The mesh was approximately 9 inches in vertical dimensions and 4 inches in transverse dimension.  It was fixed in place using 2-0 Prolene in a running fashion along the periphery of the Marlex mesh.  The umbilicus was brought through the Marlex mesh through an opening made in the mesh.  Two Blake drains were placed and brought out through separate stab incisions and placed in the abdominal wound.  The patient was placed in a semi-Fowlers position. Closure was begun by approximating the abdominal flap to the lower abdominal incision using interrupted 2-0 Vicryl sutures for the dermis.  A new opening for the umbilicus was made approximately 5-6 cm above the abdominal closure and the umbilicus was brought through this new opening and fixed in position using multiple interrupted 4-0  Vicryls for the dermis.  The transverse abdominal closure was now completed using multiple interrupted 2-0 Vicryl sutures in the dermis.  The area was cleansed and her abdomen and the skin incision was approximated using Steri-Strips. Steri-Strips were then placed around the umbilicus.  Attention was now directed to the TRAM flap.  I opted to remove a small additional amount of skin paddle on the lateral aspect since it was somewhat full laterally.  The vascularity of the flap looked excellent.  There was excellent bleeding at the edges.  The TRAM flap was now advanced to the superior limits of the mastectomy defect and the  superior breast flap was pulled over the skin paddle.  The point where the cut edge of the superior breast flap met the skin paddle was marked and all skin above this mark on the skin paddle was deepithelialized.  The superior limits of the skin paddle were now suspended from the superior limits of the mastectomy dissection using multiple interrupted 3-0 Vicryl sutures.  Inferiorly, there was a bit of excess skin, which was below the inferior skin paddle.  I deepithelialized the portion of the inferior skin paddle so that it would be below the breast flap inferiorly.  Closure of the skin paddle to the mastectomy flaps was carried out by using a running 3-0 subcuticular Vicryl for the superior incision closure.  Inferiorly, the inferior breast flap was closed with the inferior cut edge of the skin paddle using interrupted 3-0 Vicryl sutures for the dermis, followed by a running 3-0 subcuticular Vicryl for the skin edges.  The right breast now was cleansed. Steri-Strips were applied to the skin edges.  The vascularity continued to look  good.  Patient tolerated the procedure well.  Light dressings are placed over the breast and the abdomen and the abdominal incision.  Estimated blood loss was approximately 150 cc throughout the procedure.  Patient was awakened, extubated and placed in her hospital bed in a semi-Fowlers position.  She was transported to he recovery room in satisfactory condition. DD:  04/27/99 TD:  04/28/99 Job: [id number]

## 2010-09-16 NOTE — Op Note (Signed)
[place]. [facility]  Patient:    Shelley Coleman                         MRN: [id number] Proc. Date: 04/27/99 Adm. Date:  [id number] Attending:  Janalyn Rouse                           Operative Report  PREOPERATIVE DIAGNOSIS:  Right breast carcinoma.  POSTOPERATIVE DIAGNOSIS:  Right breast carcinoma.  OPERATION:  Immediate breast reconstruction following mastectomy utilizing a contralateral transverse rectus abdominis myocutaneous flap.  SURGEON:  Alfredia Ferguson, M.D.  ASSISTANT:  Janet Berlin. Dan Humphreys, M.D.  ANESTHESIA:  General endotracheal anesthesia.  INDICATION:  This is a 70 year old woman with diffuse intraductal carcinoma in itu right breast.  She has opted to undergo a right mastectomy.  She wishes to undergo immediate reconstruction following the mastectomy.  She was counseled at length in the office regarding the potential risks of this  surgery.  These risks included partial or complete loss of the flap, fat necrosis, asymmetry, wound healing difficulties, bleeding requiring transfusion, infection requiring the need for secondary surgery, vascular compromise to the umbilicus,  umbilicus being off the midline, unsightly scarring of the abdominal incision or breast incision, abdominal hernias, bulges or asymmetries, chronic pain or numbness of the abdominal skin, and overall dissatisfaction with the reconstructive results.  In spite of these and other risks discussed with the patient, she wishes to proceed with the operation.  DESCRIPTION OF PROCEDURE:  On the day prior to surgery, skin marks have been placed outlining the dimensions of the skin paddle.  At the conclusion of the mastectomy, I was summoned to the operating room.  The mastectomy site was inspected.  A 10 mm Blake drain was placed in the lateral axillary gutter.  No axillary dissection ad been performed.  A tunnel was begun in the inferomedial portion of the  mastectomy defect, dissecting approximately 8 or 9 cm inferiorly toward the left rectus muscle.  Following placement of this, the beginnings of the tunnel, a moist lap was placed in mastectomy defect and attention was directed to the abdomen.  A "shield" shaped  incision was made around the umbilicus and the umbilicus was dissected away from the skin paddle leaving a healthy cup of fat around the umbilical stalk to preserve vascular integrity.  The upper portion of the incision and skin paddle was now made and deep and through the skin and subcutaneous tissue until reaching the anterior abdominal wall fascia. The abdominal wall flap was now elevated off of the anterior rectus fascia up to the level of the costal margins bilaterally and the xiphoid in the midline. The dissection was continued until I reached my previously placed tunnel from the mastectomy defect.  This allowed access from the abdomen to the mastectomy in order to place my TRAM flap.  The patients back was now elevated approximately 30 degrees to assure that abdominal closure could be accomplished with any undue tension.  Once I was certain that it could be closed at the location of my skin marks, she was placed back in a supine position.  An incision was now made in the previously made inferior skin mark of the skin paddle and deep until reaching the anterior abdominal wall fascia.  I opted to se a contralateral rectus muscle because the patient had a paramedian incision on he right side.  The  right corner of the skin paddle was now elevated from a lateral to medial direction, elevating it off the anterior abdominal wall fascia until reaching the linea alba.  The remainder of the umbilical stalk was dissected away from the skin paddle.  Carefully, I dissected across the midline approximately 1 cm to the left of the midline.  The left corner of the skin paddle was now elevated from a lateral to  medial direction until reaching the lateral rectus perforator which were approximately 3 cm medial to the lateral rectus border.  No lateral rectus perforators were disturbed.  Two parallel incisions were made in the anterior rectus fascia beginning DD:  04/27/99 TD:  04/28/99 Job: [id number]

## 2010-09-16 NOTE — Op Note (Signed)
[place]  Patient:    Shelley Coleman, Shelley Coleman                        MRN: [id number] Proc. Date: 07/08/99 Adm. Date:  [id number] Disc. Date: [id number] Attending:  Loura Halt Ii                           Operative Report  PREOPERATIVE DIAGNOSES: 1. Right breast cancer. 2. Acquired absence, right breast. 3. Acquired absence, right nipple.  POSTOPERATIVE DIAGNOSES: 1. Right breast cancer. 2. Acquired absence, right breast. 3. Acquired absence, right nipple.  PROCEDURE PERFORMED:  Right nipple reconstruction.  SURGEON:  Alfredia Ferguson, M.D.  ANESTHESIA:  None.  INDICATIONS FOR PROCEDURE:  This is a 70 year old woman who is status post right mastectomy for treatment of breast cancer.  She has had reconstruction with a tram flap.  She is now ready for nipple reconstruction.  She understands the risks of surgery including possible failure of the nipple flap.  In spite of that, she wished to proceed with the surgery.  DESCRIPTION OF PROCEDURE:  Location of the nipple was placed with the patient in a sitting position while comparing it with the opposite side.  A 45-mm diameter circle was placed in the area for the nipple.  Within this circle, a tripartite  flap was drawn inferiorly based with approximately 2 cm of skin to be left intact. The three points of this flap pointed to the 3 oclock, 9 oclock, and 12 oclock position.  The right reconstructed breast was prepped and draped in a sterile fashion.  This three-pronged flap was now incised to the level of subcutaneous tissue, and the flap was elevated, leaving it connected to the inferiorly based  2-cm skin attachment.  The tips of each of the three flaps was removed creating  blunt ends.  The 3 oclock and 9 oclock flap were then rolled towards one another and fixed together using interrupted 4-0 chromic suture.  The flap which was pointing to the 12 oclock position was closed down on  top of the two previously  closed flaps and fixed in position with a similar suture.  This created a cylinder. The donor site was now closed by approximating the dermis using interrupted 4-0  Vicryl suture.  The skin edges were united using interrupted 4-0 chromic suture. The viability of the flap appeared to be good at the completion of the surgery.  The area was cleansed, and a bulky dressing was applied to prevent pressure on he nipple. DD:  07/08/99 TD:  07/09/99 Job: [id number]

## 2010-09-16 NOTE — Discharge Summary (Signed)
[place]  Patient:    Shelley Coleman                         MRN: [id number] Adm. Date:  [id number] Disc. Date: [id number] Attending:  Janalyn Rouse                           Discharge Summary  HISTORY OF PRESENT ILLNESS:  This is a 70 year old white married female who was found to have an abnormal right breast mammogram which resulted in a stereotactic needle biopsy.  The needle biopsy showed showed DCIS which was fairly extensive mammographically.  On the original biopsies, there were no signs of microinvasion.  Significant in the history is that she had bilateral TRAM implants from some  20 years or so ago.  After considerable discussion, it was felt that a right total mastectomy with sentinel lymph node biopsy in case there was microinvasion be followed by a TRAM reconstruction with removal of the implant, and a TRAM reconstruction done by W. Delia Chimes, M.D.  Admission laboratory data was all within normal limits.  She had no significant  physical abnormalities.  HOSPITAL COURSE:  Shortly after admission, she was taken to the operating room where a right total mastectomy with sentinel lymph node biopsy.  Sentinel node as negative.  TRAM reconstruction was done by Dr. Benna Dunks.  Postoperatively, she did very well and by the second postoperative day, she was  feeling well and was discharged to be followed as an outpatient by Dr. Benna Dunks and by me also.  Final pathology confirmed the negative sentinel lymph node, but there was no high grade intraductal carcinoma, residual and there was a focus on microinvasion.  FINAL DIAGNOSIS:  Intraductal carcinoma of the right breast with some microinvasion.  Sentinel node negative.  OPERATION: 1. Right total mastectomy with sentinel lymph node biopsy. 2. TRAM reconstruction.  COMPLICATIONS:  None.  CONDITION ON DISCHARGE:  Improved.  DISCHARGE MEDICATIONS:  Some pain  medicine.  DIET:  Regular.  ACTIVITY:  Limited.  FOLLOWUP:  Will be seen in one week in the office. DD:  05/25/99 TD:  05/26/99 Job: [id number]

## 2010-09-16 NOTE — Op Note (Signed)
. [facility]  Patient:    Shelley Coleman, Shelley Coleman                        MRN: [id number] Proc. Date: 03/15/00 Adm. Date:  [id number] Attending:  Janalyn Rouse                           Operative Report  PREOPERATIVE DIAGNOSIS:  Small cyst, subareolar, left breast.  POSTOPERATIVE DIAGNOSIS:  Small cyst, subareolar, left breast.  OPERATION:  Excision of cyst.  SURGEON:  Rose Phi. Maple Hudson, M.D.  ANESTHESIA:  MAC.  INDICATIONS FOR PROCEDURE:  This is a 70 year old married female who had a previous history of intraductal carcinoma of the right breast treated with mastectomy and TRAM reconstruction.  She had noted a new nodule in the subareolar position of the left breast at the 9 oclock position.  Mammogram and ultrasound had really failed to show anything, but the lump was clearly palpable.  DESCRIPTION OF PROCEDURE:  The patient was placed on the operating table and the left breast prepped and draped in the usual fashion.  A curvilinear incision that was circumareolar in nature was outlined, centered on the 9 oclock position.  The area was then infiltrated with 1% Xylocaine with adrenalin.  An incision was made and elevated an areolar flap, and then one could see an inclusion cyst which we excised along with a little surrounding breast tissue.  Hemostasis was obtained with the cautery.  Subcuticular closure of 4-0 Monocryl and Steri-Strips carried out.  A dressing applied.  The patient was transferred to the recovery room in satisfactory condition having tolerated the procedure well. DD:  03/15/00 TD:  03/15/00 Job: [id number]

## 2011-01-07 ENCOUNTER — Other Ambulatory Visit: Payer: Self-pay | Admitting: Family Medicine

## 2011-03-14 ENCOUNTER — Other Ambulatory Visit (HOSPITAL_COMMUNITY): Payer: Self-pay | Admitting: Orthopedic Surgery

## 2011-03-14 DIAGNOSIS — M545 Low back pain, unspecified: Secondary | ICD-10-CM

## 2011-03-14 DIAGNOSIS — R102 Pelvic and perineal pain: Secondary | ICD-10-CM

## 2011-03-16 ENCOUNTER — Ambulatory Visit (HOSPITAL_COMMUNITY): Payer: Self-pay

## 2011-03-16 ENCOUNTER — Encounter (HOSPITAL_COMMUNITY)
Admission: RE | Admit: 2011-03-16 | Discharge: 2011-03-16 | Disposition: A | Discharge status: Home / Self Care | Payer: Medicare Other | Source: Ambulatory Visit | Attending: Orthopedic Surgery | Admitting: Orthopedic Surgery

## 2011-03-16 DIAGNOSIS — M545 Low back pain, unspecified: Secondary | ICD-10-CM | POA: Insufficient documentation

## 2011-03-16 DIAGNOSIS — N949 Unspecified condition associated with female genital organs and menstrual cycle: Secondary | ICD-10-CM | POA: Insufficient documentation

## 2011-03-16 DIAGNOSIS — R102 Pelvic and perineal pain: Secondary | ICD-10-CM

## 2011-03-16 MED ORDER — TECHNETIUM TC 99M MEDRONATE IV KIT
25.0000 | PACK | Freq: Once | INTRAVENOUS | Status: AC | PRN
  Administered 2011-03-16: 25 via INTRAVENOUS

## 2011-03-24 ENCOUNTER — Encounter (HOSPITAL_COMMUNITY): Payer: Self-pay

## 2011-03-24 ENCOUNTER — Ambulatory Visit (HOSPITAL_COMMUNITY): Payer: Self-pay

## 2011-06-08 DIAGNOSIS — IMO0002 Reserved for concepts with insufficient information to code with codable children: Secondary | ICD-10-CM | POA: Diagnosis not present

## 2011-06-13 DIAGNOSIS — G544 Lumbosacral root disorders, not elsewhere classified: Secondary | ICD-10-CM | POA: Diagnosis not present

## 2011-06-16 DIAGNOSIS — M4310 Spondylolisthesis, site unspecified: Secondary | ICD-10-CM | POA: Diagnosis not present

## 2011-06-16 DIAGNOSIS — IMO0002 Reserved for concepts with insufficient information to code with codable children: Secondary | ICD-10-CM | POA: Diagnosis not present

## 2011-06-16 DIAGNOSIS — M48061 Spinal stenosis, lumbar region without neurogenic claudication: Secondary | ICD-10-CM | POA: Diagnosis not present

## 2011-06-21 DIAGNOSIS — M19049 Primary osteoarthritis, unspecified hand: Secondary | ICD-10-CM | POA: Diagnosis not present

## 2011-06-27 ENCOUNTER — Ambulatory Visit (INDEPENDENT_AMBULATORY_CARE_PROVIDER_SITE_OTHER): Payer: Medicare Other | Admitting: Family Medicine

## 2011-06-27 ENCOUNTER — Encounter: Payer: Self-pay | Admitting: Family Medicine

## 2011-06-27 DIAGNOSIS — F329 Major depressive disorder, single episode, unspecified: Secondary | ICD-10-CM

## 2011-06-27 DIAGNOSIS — R4789 Other speech disturbances: Secondary | ICD-10-CM | POA: Diagnosis not present

## 2011-06-27 DIAGNOSIS — M549 Dorsalgia, unspecified: Secondary | ICD-10-CM

## 2011-06-27 DIAGNOSIS — F3289 Other specified depressive episodes: Secondary | ICD-10-CM

## 2011-06-27 DIAGNOSIS — R4702 Dysphasia: Secondary | ICD-10-CM

## 2011-06-27 DIAGNOSIS — I10 Essential (primary) hypertension: Secondary | ICD-10-CM | POA: Diagnosis not present

## 2011-06-27 MED ORDER — ATENOLOL 50 MG PO TABS: 50.0000 mg | ORAL_TABLET | Freq: Every day | ORAL | Status: DC

## 2011-06-27 MED ORDER — HYDROCHLOROTHIAZIDE 25 MG PO TABS: 25.0000 mg | ORAL_TABLET | Freq: Every day | ORAL | Status: DC

## 2011-06-27 MED ORDER — CITALOPRAM HYDROBROMIDE 20 MG PO TABS: 20.0000 mg | ORAL_TABLET | Freq: Every day | ORAL | Status: DC

## 2011-06-27 NOTE — Patient Instructions (Signed)
Begin the Celexa 20 mg a day at bedtime  Consult with Dr. Jarold Motto your gastroenterologist concerning the difficulty swallowing  In the meantime be sure to stay on a soft diet and drink lots of water  Set up an appointment to discuss with your neurosurgeon any questions you have regarding surgery for back pain.

## 2011-06-27 NOTE — Progress Notes (Signed)
  Subjective:    Patient ID: Shelley Coleman, female    DOB: Jul 27, 1940, 71 y.o.   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 She's currently taking Motrin and Vicodin at the direction of a neurosurgeon in [place]. They have recommended surgery as a possible treatment option in lieu of taking medication on a daily basis. She wants my opinion. I explained to her that I am not a neurosurgeon and I would refer her back to her neurosurgeon to discuss the pluses and minuses of the surgical procedure  At one time she was on Celexa for depression and would like that restarted  She also has a history of reflux esophagitis and now she's having difficulty swallowing. She's requesting a prescription for Nexium. I explained to her that we best to she sees Dr. Jarold Motto her gastroenterologist for a consult   Review of Systems    general review of systems otherwise negative Objective:   Physical Exam  Well-developed well-nourished female in no acute distress      Assessment & Plan:  Chronic back pain discuss with your neurosurgeon treatment options  History of depression restart Celexa 20 mg each bedtime  History of reflux esophagitis now with dysphasia consult with Dr. Jarold Motto

## 2011-08-10 DIAGNOSIS — M4120 Other idiopathic scoliosis, site unspecified: Secondary | ICD-10-CM | POA: Diagnosis not present

## 2011-08-10 DIAGNOSIS — Z8781 Personal history of (healed) traumatic fracture: Secondary | ICD-10-CM | POA: Diagnosis not present

## 2011-08-10 DIAGNOSIS — IMO0002 Reserved for concepts with insufficient information to code with codable children: Secondary | ICD-10-CM | POA: Diagnosis not present

## 2011-08-10 DIAGNOSIS — M4310 Spondylolisthesis, site unspecified: Secondary | ICD-10-CM | POA: Diagnosis not present

## 2011-08-10 DIAGNOSIS — M47817 Spondylosis without myelopathy or radiculopathy, lumbosacral region: Secondary | ICD-10-CM | POA: Diagnosis not present

## 2011-08-10 DIAGNOSIS — M48061 Spinal stenosis, lumbar region without neurogenic claudication: Secondary | ICD-10-CM | POA: Diagnosis not present

## 2011-08-10 DIAGNOSIS — M5137 Other intervertebral disc degeneration, lumbosacral region: Secondary | ICD-10-CM | POA: Diagnosis not present

## 2011-08-10 DIAGNOSIS — Z01818 Encounter for other preprocedural examination: Secondary | ICD-10-CM | POA: Diagnosis not present

## 2011-08-15 DIAGNOSIS — Z01810 Encounter for preprocedural cardiovascular examination: Secondary | ICD-10-CM | POA: Diagnosis not present

## 2011-08-15 DIAGNOSIS — M4310 Spondylolisthesis, site unspecified: Secondary | ICD-10-CM | POA: Diagnosis not present

## 2011-08-15 DIAGNOSIS — IMO0002 Reserved for concepts with insufficient information to code with codable children: Secondary | ICD-10-CM | POA: Diagnosis not present

## 2011-08-15 DIAGNOSIS — M48061 Spinal stenosis, lumbar region without neurogenic claudication: Secondary | ICD-10-CM | POA: Diagnosis not present

## 2011-08-15 DIAGNOSIS — I498 Other specified cardiac arrhythmias: Secondary | ICD-10-CM | POA: Diagnosis not present

## 2011-09-04 DIAGNOSIS — Z981 Arthrodesis status: Secondary | ICD-10-CM | POA: Diagnosis not present

## 2011-09-04 DIAGNOSIS — F411 Generalized anxiety disorder: Secondary | ICD-10-CM | POA: Diagnosis present

## 2011-09-04 DIAGNOSIS — M48061 Spinal stenosis, lumbar region without neurogenic claudication: Secondary | ICD-10-CM | POA: Diagnosis not present

## 2011-09-04 DIAGNOSIS — M545 Low back pain: Secondary | ICD-10-CM | POA: Diagnosis not present

## 2011-09-04 DIAGNOSIS — R0602 Shortness of breath: Secondary | ICD-10-CM | POA: Diagnosis not present

## 2011-09-04 DIAGNOSIS — F329 Major depressive disorder, single episode, unspecified: Secondary | ICD-10-CM | POA: Diagnosis present

## 2011-09-04 DIAGNOSIS — Z853 Personal history of malignant neoplasm of breast: Secondary | ICD-10-CM | POA: Diagnosis not present

## 2011-09-04 DIAGNOSIS — M4310 Spondylolisthesis, site unspecified: Secondary | ICD-10-CM | POA: Diagnosis not present

## 2011-09-04 DIAGNOSIS — M47817 Spondylosis without myelopathy or radiculopathy, lumbosacral region: Secondary | ICD-10-CM | POA: Diagnosis not present

## 2011-09-04 DIAGNOSIS — M949 Disorder of cartilage, unspecified: Secondary | ICD-10-CM | POA: Diagnosis not present

## 2011-09-04 DIAGNOSIS — S32009A Unspecified fracture of unspecified lumbar vertebra, initial encounter for closed fracture: Secondary | ICD-10-CM | POA: Diagnosis not present

## 2011-09-04 DIAGNOSIS — F17200 Nicotine dependence, unspecified, uncomplicated: Secondary | ICD-10-CM | POA: Diagnosis present

## 2011-09-08 DIAGNOSIS — G2581 Restless legs syndrome: Secondary | ICD-10-CM | POA: Diagnosis not present

## 2011-09-08 DIAGNOSIS — G609 Hereditary and idiopathic neuropathy, unspecified: Secondary | ICD-10-CM | POA: Diagnosis not present

## 2011-09-08 DIAGNOSIS — Z4789 Encounter for other orthopedic aftercare: Secondary | ICD-10-CM | POA: Diagnosis not present

## 2011-09-08 DIAGNOSIS — IMO0001 Reserved for inherently not codable concepts without codable children: Secondary | ICD-10-CM | POA: Diagnosis not present

## 2011-09-08 DIAGNOSIS — F329 Major depressive disorder, single episode, unspecified: Secondary | ICD-10-CM | POA: Diagnosis not present

## 2011-09-11 ENCOUNTER — Telehealth: Payer: Self-pay | Admitting: Family Medicine

## 2011-09-11 NOTE — Telephone Encounter (Signed)
Pt called 2:25 - pulled from Triage vmail. Recently had major back surgery. Since her foley has been removed, she has urination frequency, cloudy, urine, etc. She thinks it is cystitis, which she's had before. States Dr. Tawanna Cooler usually Rx's Cipro. She called the surgeon, and he said her PCP needed to do this. Please call something in ASAP to [facility] on [address]. With her major back surgery, she cannot come in, and she is in a lot of pain to be getting up every hour to use the bathroom.

## 2011-09-11 NOTE — Telephone Encounter (Signed)
cipro 500 #14 one BID 

## 2011-09-12 DIAGNOSIS — Z4789 Encounter for other orthopedic aftercare: Secondary | ICD-10-CM | POA: Diagnosis not present

## 2011-09-12 DIAGNOSIS — IMO0001 Reserved for inherently not codable concepts without codable children: Secondary | ICD-10-CM | POA: Diagnosis not present

## 2011-09-12 DIAGNOSIS — G609 Hereditary and idiopathic neuropathy, unspecified: Secondary | ICD-10-CM | POA: Diagnosis not present

## 2011-09-12 DIAGNOSIS — G2581 Restless legs syndrome: Secondary | ICD-10-CM | POA: Diagnosis not present

## 2011-09-12 DIAGNOSIS — F329 Major depressive disorder, single episode, unspecified: Secondary | ICD-10-CM | POA: Diagnosis not present

## 2011-09-12 MED ORDER — CIPROFLOXACIN HCL 500 MG PO TABS: 500.0000 mg | ORAL_TABLET | Freq: Two times a day (BID) | ORAL | Status: DC

## 2011-09-12 NOTE — Telephone Encounter (Signed)
Rx sent to pharmacy and patient is aware 

## 2011-09-15 DIAGNOSIS — Z4789 Encounter for other orthopedic aftercare: Secondary | ICD-10-CM | POA: Diagnosis not present

## 2011-09-15 DIAGNOSIS — G2581 Restless legs syndrome: Secondary | ICD-10-CM | POA: Diagnosis not present

## 2011-09-15 DIAGNOSIS — IMO0001 Reserved for inherently not codable concepts without codable children: Secondary | ICD-10-CM | POA: Diagnosis not present

## 2011-09-15 DIAGNOSIS — F329 Major depressive disorder, single episode, unspecified: Secondary | ICD-10-CM | POA: Diagnosis not present

## 2011-09-15 DIAGNOSIS — G609 Hereditary and idiopathic neuropathy, unspecified: Secondary | ICD-10-CM | POA: Diagnosis not present

## 2011-09-19 DIAGNOSIS — F329 Major depressive disorder, single episode, unspecified: Secondary | ICD-10-CM | POA: Diagnosis not present

## 2011-09-19 DIAGNOSIS — G2581 Restless legs syndrome: Secondary | ICD-10-CM | POA: Diagnosis not present

## 2011-09-19 DIAGNOSIS — G609 Hereditary and idiopathic neuropathy, unspecified: Secondary | ICD-10-CM | POA: Diagnosis not present

## 2011-09-19 DIAGNOSIS — Z4789 Encounter for other orthopedic aftercare: Secondary | ICD-10-CM | POA: Diagnosis not present

## 2011-09-19 DIAGNOSIS — IMO0001 Reserved for inherently not codable concepts without codable children: Secondary | ICD-10-CM | POA: Diagnosis not present

## 2011-09-21 DIAGNOSIS — G609 Hereditary and idiopathic neuropathy, unspecified: Secondary | ICD-10-CM | POA: Diagnosis not present

## 2011-09-21 DIAGNOSIS — Z4789 Encounter for other orthopedic aftercare: Secondary | ICD-10-CM | POA: Diagnosis not present

## 2011-09-21 DIAGNOSIS — G2581 Restless legs syndrome: Secondary | ICD-10-CM | POA: Diagnosis not present

## 2011-09-21 DIAGNOSIS — F329 Major depressive disorder, single episode, unspecified: Secondary | ICD-10-CM | POA: Diagnosis not present

## 2011-09-21 DIAGNOSIS — IMO0001 Reserved for inherently not codable concepts without codable children: Secondary | ICD-10-CM | POA: Diagnosis not present

## 2011-09-26 DIAGNOSIS — G2581 Restless legs syndrome: Secondary | ICD-10-CM | POA: Diagnosis not present

## 2011-09-26 DIAGNOSIS — M545 Low back pain, unspecified: Secondary | ICD-10-CM | POA: Diagnosis not present

## 2011-09-26 DIAGNOSIS — IMO0001 Reserved for inherently not codable concepts without codable children: Secondary | ICD-10-CM | POA: Diagnosis not present

## 2011-09-26 DIAGNOSIS — G609 Hereditary and idiopathic neuropathy, unspecified: Secondary | ICD-10-CM | POA: Diagnosis not present

## 2011-09-26 DIAGNOSIS — Z4789 Encounter for other orthopedic aftercare: Secondary | ICD-10-CM | POA: Diagnosis not present

## 2011-09-26 DIAGNOSIS — F329 Major depressive disorder, single episode, unspecified: Secondary | ICD-10-CM | POA: Diagnosis not present

## 2011-09-28 DIAGNOSIS — Z4789 Encounter for other orthopedic aftercare: Secondary | ICD-10-CM | POA: Diagnosis not present

## 2011-09-28 DIAGNOSIS — G609 Hereditary and idiopathic neuropathy, unspecified: Secondary | ICD-10-CM | POA: Diagnosis not present

## 2011-09-28 DIAGNOSIS — F329 Major depressive disorder, single episode, unspecified: Secondary | ICD-10-CM | POA: Diagnosis not present

## 2011-09-28 DIAGNOSIS — IMO0001 Reserved for inherently not codable concepts without codable children: Secondary | ICD-10-CM | POA: Diagnosis not present

## 2011-09-28 DIAGNOSIS — G2581 Restless legs syndrome: Secondary | ICD-10-CM | POA: Diagnosis not present

## 2011-10-02 DIAGNOSIS — G2581 Restless legs syndrome: Secondary | ICD-10-CM | POA: Diagnosis not present

## 2011-10-02 DIAGNOSIS — Z4789 Encounter for other orthopedic aftercare: Secondary | ICD-10-CM | POA: Diagnosis not present

## 2011-10-02 DIAGNOSIS — G609 Hereditary and idiopathic neuropathy, unspecified: Secondary | ICD-10-CM | POA: Diagnosis not present

## 2011-10-02 DIAGNOSIS — F329 Major depressive disorder, single episode, unspecified: Secondary | ICD-10-CM | POA: Diagnosis not present

## 2011-10-02 DIAGNOSIS — IMO0001 Reserved for inherently not codable concepts without codable children: Secondary | ICD-10-CM | POA: Diagnosis not present

## 2011-10-06 DIAGNOSIS — IMO0001 Reserved for inherently not codable concepts without codable children: Secondary | ICD-10-CM | POA: Diagnosis not present

## 2011-10-06 DIAGNOSIS — F329 Major depressive disorder, single episode, unspecified: Secondary | ICD-10-CM | POA: Diagnosis not present

## 2011-10-06 DIAGNOSIS — G2581 Restless legs syndrome: Secondary | ICD-10-CM | POA: Diagnosis not present

## 2011-10-06 DIAGNOSIS — G609 Hereditary and idiopathic neuropathy, unspecified: Secondary | ICD-10-CM | POA: Diagnosis not present

## 2011-10-06 DIAGNOSIS — Z4789 Encounter for other orthopedic aftercare: Secondary | ICD-10-CM | POA: Diagnosis not present

## 2011-11-07 DIAGNOSIS — M545 Low back pain, unspecified: Secondary | ICD-10-CM | POA: Diagnosis not present

## 2011-11-07 DIAGNOSIS — IMO0002 Reserved for concepts with insufficient information to code with codable children: Secondary | ICD-10-CM | POA: Diagnosis not present

## 2011-11-13 ENCOUNTER — Other Ambulatory Visit: Payer: Self-pay | Admitting: Family Medicine

## 2011-11-14 ENCOUNTER — Encounter: Payer: Self-pay | Admitting: Family Medicine

## 2011-11-14 ENCOUNTER — Ambulatory Visit (INDEPENDENT_AMBULATORY_CARE_PROVIDER_SITE_OTHER): Payer: Medicare Other | Admitting: Family Medicine

## 2011-11-14 VITALS — BP 94/62 | Temp 99.2°F | Wt 174.0 lb

## 2011-11-14 DIAGNOSIS — I959 Hypotension, unspecified: Secondary | ICD-10-CM | POA: Diagnosis not present

## 2011-11-14 DIAGNOSIS — R5381 Other malaise: Secondary | ICD-10-CM

## 2011-11-14 DIAGNOSIS — D649 Anemia, unspecified: Secondary | ICD-10-CM

## 2011-11-14 DIAGNOSIS — R5383 Other fatigue: Secondary | ICD-10-CM

## 2011-11-14 NOTE — Progress Notes (Signed)
  Subjective:    Patient ID: Shelley Coleman, female    DOB: Sep 10, 1940, 71 y.o.   MRN: [id number]  HPI  Shelley Coleman is a 71 year old married female nonsmoker who comes in today for evaluation of multiple issues  She tells me in detail how they did her spinal surgery and how long it took,,,,,,,,,, 7 hours. She's currently taking Vicodin 1 tablet twice daily from her neurosurgeon in [place].  She states she feels tired fatigue and no energy. In the hospital she dropped her hemoglobin to 7 her postop hemoglobin now is 12.9 normal  She's also concerned about her blood pressure being too low. She's on no medication that would cause low blood pressure. She had a complete diagnostic evaluation hospital all of which was normal  Review of Systems    total general review of systems very positive Objective:   Physical Exam  Well-developed well-nourished female no acute distress Hemoglobin 12.9     Assessment & Plan:  Anemia resolved okay to stop iron

## 2011-11-22 DIAGNOSIS — D0590 Unspecified type of carcinoma in situ of unspecified breast: Secondary | ICD-10-CM | POA: Diagnosis not present

## 2012-01-10 DIAGNOSIS — H538 Other visual disturbances: Secondary | ICD-10-CM | POA: Insufficient documentation

## 2012-01-10 DIAGNOSIS — M79609 Pain in unspecified limb: Secondary | ICD-10-CM | POA: Insufficient documentation

## 2012-01-10 DIAGNOSIS — M4120 Other idiopathic scoliosis, site unspecified: Secondary | ICD-10-CM | POA: Insufficient documentation

## 2012-01-10 DIAGNOSIS — G544 Lumbosacral root disorders, not elsewhere classified: Secondary | ICD-10-CM | POA: Insufficient documentation

## 2012-01-10 DIAGNOSIS — G2581 Restless legs syndrome: Secondary | ICD-10-CM | POA: Insufficient documentation

## 2012-01-10 DIAGNOSIS — C50919 Malignant neoplasm of unspecified site of unspecified female breast: Secondary | ICD-10-CM | POA: Insufficient documentation

## 2012-01-10 DIAGNOSIS — H532 Diplopia: Secondary | ICD-10-CM | POA: Insufficient documentation

## 2012-01-10 DIAGNOSIS — G252 Other specified forms of tremor: Secondary | ICD-10-CM | POA: Insufficient documentation

## 2012-01-10 DIAGNOSIS — IMO0002 Reserved for concepts with insufficient information to code with codable children: Secondary | ICD-10-CM | POA: Diagnosis not present

## 2012-01-10 DIAGNOSIS — H469 Unspecified optic neuritis: Secondary | ICD-10-CM | POA: Insufficient documentation

## 2012-01-11 ENCOUNTER — Ambulatory Visit (INDEPENDENT_AMBULATORY_CARE_PROVIDER_SITE_OTHER): Payer: Medicare Other

## 2012-01-11 DIAGNOSIS — Z23 Encounter for immunization: Secondary | ICD-10-CM | POA: Diagnosis not present

## 2012-01-15 DIAGNOSIS — G544 Lumbosacral root disorders, not elsewhere classified: Secondary | ICD-10-CM | POA: Diagnosis not present

## 2012-02-13 DIAGNOSIS — M545 Low back pain, unspecified: Secondary | ICD-10-CM | POA: Diagnosis not present

## 2012-02-13 DIAGNOSIS — IMO0002 Reserved for concepts with insufficient information to code with codable children: Secondary | ICD-10-CM | POA: Diagnosis not present

## 2012-02-13 DIAGNOSIS — Z981 Arthrodesis status: Secondary | ICD-10-CM | POA: Diagnosis not present

## 2012-02-22 ENCOUNTER — Other Ambulatory Visit (INDEPENDENT_AMBULATORY_CARE_PROVIDER_SITE_OTHER): Payer: Medicare Other

## 2012-02-22 DIAGNOSIS — D649 Anemia, unspecified: Secondary | ICD-10-CM

## 2012-02-22 DIAGNOSIS — E785 Hyperlipidemia, unspecified: Secondary | ICD-10-CM

## 2012-02-22 DIAGNOSIS — Z0000 Encounter for general adult medical examination without abnormal findings: Secondary | ICD-10-CM

## 2012-02-22 LAB — CBC WITH DIFFERENTIAL/PLATELET
Basophils Relative: 1.1 % (ref 0.0–3.0)
Eosinophils Absolute: 0.3 10*3/uL (ref 0.0–0.7)
Lymphocytes Relative: 31 % (ref 12.0–46.0)
MCHC: 32.5 g/dL (ref 30.0–36.0)
MCV: 86 fl (ref 78.0–100.0)
Monocytes Absolute: 0.6 10*3/uL (ref 0.1–1.0)
Neutrophils Relative %: 55 % (ref 43.0–77.0)
Platelets: 262 10*3/uL (ref 150.0–400.0)
RBC: 4.62 Mil/uL (ref 3.87–5.11)
WBC: 7.5 10*3/uL (ref 4.5–10.5)

## 2012-02-22 LAB — POCT URINALYSIS DIPSTICK
Glucose, UA: NEGATIVE
Nitrite, UA: NEGATIVE
Protein, UA: NEGATIVE
Spec Grav, UA: 1.02
Urobilinogen, UA: 0.2
pH, UA: 6

## 2012-02-23 LAB — HEPATIC FUNCTION PANEL
ALT: 20 U/L (ref 0–35)
AST: 21 U/L (ref 0–37)
Total Bilirubin: 0.8 mg/dL (ref 0.3–1.2)
Total Protein: 7.5 g/dL (ref 6.0–8.3)

## 2012-02-23 LAB — LIPID PANEL
Cholesterol: 228 mg/dL — ABNORMAL HIGH (ref 0–200)
HDL: 47.7 mg/dL (ref >39.00)
Total CHOL/HDL Ratio: 5
Triglycerides: 171 mg/dL — ABNORMAL HIGH (ref 0.0–149.0)
VLDL: 34.2 mg/dL (ref 0.0–40.0)

## 2012-02-23 LAB — BASIC METABOLIC PANEL
Chloride: 103 mEq/L (ref 96–112)
GFR: 68.49 mL/min (ref >60.00)
Potassium: 4.2 mEq/L (ref 3.5–5.1)
Sodium: 138 mEq/L (ref 135–145)

## 2012-02-23 LAB — LDL CHOLESTEROL, DIRECT: Direct LDL: 168.5 mg/dL

## 2012-03-19 ENCOUNTER — Ambulatory Visit (INDEPENDENT_AMBULATORY_CARE_PROVIDER_SITE_OTHER): Payer: Medicare Other | Admitting: Family Medicine

## 2012-03-19 ENCOUNTER — Encounter: Payer: Self-pay | Admitting: Family Medicine

## 2012-03-19 VITALS — BP 102/64 | Temp 98.7°F | Ht 67.0 in | Wt 182.0 lb

## 2012-03-19 DIAGNOSIS — K219 Gastro-esophageal reflux disease without esophagitis: Secondary | ICD-10-CM

## 2012-03-19 DIAGNOSIS — R4789 Other speech disturbances: Secondary | ICD-10-CM

## 2012-03-19 DIAGNOSIS — F329 Major depressive disorder, single episode, unspecified: Secondary | ICD-10-CM

## 2012-03-19 DIAGNOSIS — Z853 Personal history of malignant neoplasm of breast: Secondary | ICD-10-CM | POA: Diagnosis not present

## 2012-03-19 DIAGNOSIS — M549 Dorsalgia, unspecified: Secondary | ICD-10-CM

## 2012-03-19 DIAGNOSIS — G609 Hereditary and idiopathic neuropathy, unspecified: Secondary | ICD-10-CM

## 2012-03-19 DIAGNOSIS — Z0000 Encounter for general adult medical examination without abnormal findings: Secondary | ICD-10-CM

## 2012-03-19 DIAGNOSIS — E785 Hyperlipidemia, unspecified: Secondary | ICD-10-CM

## 2012-03-19 DIAGNOSIS — F419 Anxiety disorder, unspecified: Secondary | ICD-10-CM | POA: Insufficient documentation

## 2012-03-19 DIAGNOSIS — R4702 Dysphasia: Secondary | ICD-10-CM

## 2012-03-19 MED ORDER — CITALOPRAM HYDROBROMIDE 20 MG PO TABS: 20.0000 mg | ORAL_TABLET | Freq: Every day | ORAL | Status: DC

## 2012-03-19 NOTE — Patient Instructions (Addendum)
Return sometime in the next couple weeks for removal of the lesion on your back  Continue other medications  Return to see [place] in a year for general Medicare wellness examination sooner if any problems  Call Dr. Lamount Cranker office for reconsultation concerning your difficulty swallowing  Remember to get your eye exam this fall

## 2012-03-19 NOTE — Progress Notes (Signed)
Subjective:    Patient ID: Shelley Coleman, female    DOB: November 13, 1940, 71 y.o.   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 love for her peripheral neuropathy  She takes Tenormin 50 mg daily when necessary for palpitations  She takes Celexa 20 mg nightly at bedtime because of a history of depression.  She takes hydrochlorothiazide 25 mg daily when necessary for fluid retention  She has a prescription of nitroglycerin which she takes when necessary for esophageal spasm because of a history of a hiatal hernia and reflux esophagitis and recently she's having more difficulty swallowing. I will refer her back to Dr. Jarold Motto  She takes her clip 2 mg each bedtime for history of restless leg syndrome  Last winter she had disc surgery x3 rods and screws put in her back at [facility]. They also found a hemangioma would say left alone. Postop she says she's having difficulty with some weakness in her left leg. She's been seen for followup and told it would take time to heal.  She gets routine eye care, dental care, BSE monthly,,,,,,,,,,,, history of breast cancer with bilateral reconstruction,,,,,,,, routine mammography, colonoscopy and GI normal, seasonal flu shot 2013, Pneumovax 2009, tetanus 2009, shingles 2009. Recent EKG normal. Cognitive function normal she walks as much as she can with her back problem, home health safety reviewed no issues identified, no guns in the house, she does have a health care power of attorney and living will  She's had a history of a melanoma on the right side of her hip   Review of Systems  Constitutional: Negative.   HENT: Negative.   Eyes: Negative.   Respiratory: Negative.   Cardiovascular: Negative.   Gastrointestinal: Negative.   Genitourinary: Negative.   Musculoskeletal: Negative.   Neurological:  Negative.   Hematological: Negative.   Psychiatric/Behavioral: Negative.        Objective:   Physical Exam  Constitutional: She appears well-developed and well-nourished.  HENT:  Head: Normocephalic and atraumatic.  Right Ear: External ear normal.  Left Ear: External ear normal.  Nose: Nose normal.  Mouth/Throat: Oropharynx is clear and moist.  Eyes: EOM are normal. Pupils are equal, round, and reactive to light.  Neck: Normal range of motion. Neck supple. No thyromegaly present.  Cardiovascular: Normal rate, regular rhythm, normal heart sounds and intact distal pulses.  Exam reveals no gallop and no friction rub.   No murmur heard. Pulmonary/Chest: Effort normal and breath sounds normal.  Abdominal: Soft. Bowel sounds are normal. She exhibits no distension and no mass. There is no tenderness. There is no rebound.  Musculoskeletal: Normal range of motion.  Lymphadenopathy:    She has no cervical adenopathy.  Neurological: She is alert. She has normal reflexes. No cranial nerve deficit. She exhibits normal muscle tone. Coordination normal.  Skin: Skin is warm and dry.       Total body skin exam normal except for a scar left scapula from previous lesion removed with keloid. There is a black 5 mm light lesion under the asked to return for removal ASAP scar in the midline of her back from previous disc surgery scar in the right hip from previous melanoma surgery scars in both breasts from previous breast reconstruction and scars in the abdomen from previous TRAM flap  Psychiatric: She has a normal mood and affect. Her behavior  is normal. Judgment and thought content normal.          Assessment & Plan:  Healthy female  History of mild depression continue Celexa 20 mg daily  History of palpitations Tenormin 50 mg when necessary  Fluid retention hydrochlorothiazide 25 mg when necessary  History of esophageal spasm reflux esophagitis and no hernia referred back to Dr. Jarold Motto  because she's having difficulty swallowing now.  Restless leg syndrome continue Requip 2 mg daily  Status post back surgery  Status post mastectomy with reconstruction bilaterally  History of melanoma  Abnormal lesion left back return for removal ASAP

## 2012-03-29 DIAGNOSIS — Z471 Aftercare following joint replacement surgery: Secondary | ICD-10-CM | POA: Diagnosis not present

## 2012-03-29 DIAGNOSIS — Z96649 Presence of unspecified artificial hip joint: Secondary | ICD-10-CM | POA: Diagnosis not present

## 2012-04-01 ENCOUNTER — Ambulatory Visit (INDEPENDENT_AMBULATORY_CARE_PROVIDER_SITE_OTHER): Payer: Medicare Other | Admitting: Family Medicine

## 2012-04-01 ENCOUNTER — Encounter: Payer: Self-pay | Admitting: Family Medicine

## 2012-04-01 DIAGNOSIS — D235 Other benign neoplasm of skin of trunk: Secondary | ICD-10-CM | POA: Insufficient documentation

## 2012-04-01 NOTE — Patient Instructions (Signed)
If we do not call you within 2 weeks with the path report call me  Remove the Band-Aid tomorrow and leave the site open to the air

## 2012-04-01 NOTE — Progress Notes (Signed)
  Subjective:    Patient ID: Shelley Coleman, female    DOB: 1940-07-12, 71 y.o.   MRN: [id number]  HPI Shelley Coleman is a 71 year old married female nonsmoker who comes in today for removal of a black lesion on her back  The lesion is a 6 mm x 6 mm black flat low the left scapula.  After informed consent the lesion was anesthetized with 1% Xylocaine with epinephrine and removed with 3 mm margins. It was sent for pathologic analysis. The base was cauterized Band-Aids applied. She tolerated the procedure no complications   Review of Systems    dermatologic review of systems otherwise negative Objective:   Physical Exam  Procedure see above      Assessment & Plan:  Clinically it appears to be a dysplastic nevus Path pending

## 2012-06-05 DIAGNOSIS — D0590 Unspecified type of carcinoma in situ of unspecified breast: Secondary | ICD-10-CM | POA: Diagnosis not present

## 2012-06-20 DIAGNOSIS — M899 Disorder of bone, unspecified: Secondary | ICD-10-CM | POA: Diagnosis not present

## 2012-06-20 DIAGNOSIS — M549 Dorsalgia, unspecified: Secondary | ICD-10-CM | POA: Diagnosis not present

## 2012-06-20 DIAGNOSIS — Z981 Arthrodesis status: Secondary | ICD-10-CM | POA: Diagnosis not present

## 2012-06-20 DIAGNOSIS — G8929 Other chronic pain: Secondary | ICD-10-CM | POA: Diagnosis not present

## 2012-06-28 DIAGNOSIS — M6281 Muscle weakness (generalized): Secondary | ICD-10-CM | POA: Diagnosis not present

## 2012-06-28 DIAGNOSIS — M545 Low back pain: Secondary | ICD-10-CM | POA: Diagnosis not present

## 2012-07-03 DIAGNOSIS — M545 Low back pain: Secondary | ICD-10-CM | POA: Diagnosis not present

## 2012-07-03 DIAGNOSIS — M6281 Muscle weakness (generalized): Secondary | ICD-10-CM | POA: Diagnosis not present

## 2012-07-09 DIAGNOSIS — M6281 Muscle weakness (generalized): Secondary | ICD-10-CM | POA: Diagnosis not present

## 2012-07-09 DIAGNOSIS — M545 Low back pain: Secondary | ICD-10-CM | POA: Diagnosis not present

## 2012-07-12 DIAGNOSIS — M545 Low back pain: Secondary | ICD-10-CM | POA: Diagnosis not present

## 2012-07-12 DIAGNOSIS — M6281 Muscle weakness (generalized): Secondary | ICD-10-CM | POA: Diagnosis not present

## 2012-07-16 DIAGNOSIS — M545 Low back pain: Secondary | ICD-10-CM | POA: Diagnosis not present

## 2012-07-16 DIAGNOSIS — M6281 Muscle weakness (generalized): Secondary | ICD-10-CM | POA: Diagnosis not present

## 2012-07-18 DIAGNOSIS — M545 Low back pain: Secondary | ICD-10-CM | POA: Diagnosis not present

## 2012-07-18 DIAGNOSIS — M6281 Muscle weakness (generalized): Secondary | ICD-10-CM | POA: Diagnosis not present

## 2012-07-26 DIAGNOSIS — M545 Low back pain: Secondary | ICD-10-CM | POA: Diagnosis not present

## 2012-07-26 DIAGNOSIS — M6281 Muscle weakness (generalized): Secondary | ICD-10-CM | POA: Diagnosis not present

## 2012-07-30 DIAGNOSIS — M545 Low back pain: Secondary | ICD-10-CM | POA: Diagnosis not present

## 2012-07-30 DIAGNOSIS — M6281 Muscle weakness (generalized): Secondary | ICD-10-CM | POA: Diagnosis not present

## 2012-08-02 DIAGNOSIS — M545 Low back pain: Secondary | ICD-10-CM | POA: Diagnosis not present

## 2012-08-02 DIAGNOSIS — M6281 Muscle weakness (generalized): Secondary | ICD-10-CM | POA: Diagnosis not present

## 2012-08-07 DIAGNOSIS — M6281 Muscle weakness (generalized): Secondary | ICD-10-CM | POA: Diagnosis not present

## 2012-08-07 DIAGNOSIS — M545 Low back pain: Secondary | ICD-10-CM | POA: Diagnosis not present

## 2012-08-13 DIAGNOSIS — M545 Low back pain: Secondary | ICD-10-CM | POA: Diagnosis not present

## 2012-08-13 DIAGNOSIS — M6281 Muscle weakness (generalized): Secondary | ICD-10-CM | POA: Diagnosis not present

## 2012-08-15 DIAGNOSIS — M6281 Muscle weakness (generalized): Secondary | ICD-10-CM | POA: Diagnosis not present

## 2012-08-15 DIAGNOSIS — M545 Low back pain: Secondary | ICD-10-CM | POA: Diagnosis not present

## 2012-08-19 DIAGNOSIS — M6281 Muscle weakness (generalized): Secondary | ICD-10-CM | POA: Diagnosis not present

## 2012-08-19 DIAGNOSIS — M545 Low back pain: Secondary | ICD-10-CM | POA: Diagnosis not present

## 2012-08-21 ENCOUNTER — Telehealth: Payer: Self-pay

## 2012-08-21 NOTE — Telephone Encounter (Signed)
Former [facility] patient, has not been assigned new MD.  Patient says has been taking more Ropinerole than prescribed.  Claims Dr Sandria Manly told her she could take more pills if needed.  Now is out of meds and it is refill too soon at the pharmacy until May 10th.  She would like a new rx with directions allowing her to take 2-3 daily.  Said she tries to limit the amount she takes, but sometimes the jerking is too bad and she has to take one in the late afternoon and in the middle of the night.  WID, please advise.  Thank you.

## 2012-08-21 NOTE — Telephone Encounter (Signed)
I will allow a Rx: ropinirole 2 mg, take 1 to 1 1/2 pill each night for RLS. #45, with 3 refills, thx Huston Foley, MD, PhD [facility] ([facility])

## 2012-08-22 DIAGNOSIS — M545 Low back pain: Secondary | ICD-10-CM | POA: Diagnosis not present

## 2012-08-22 DIAGNOSIS — M6281 Muscle weakness (generalized): Secondary | ICD-10-CM | POA: Diagnosis not present

## 2012-08-22 MED ORDER — ROPINIROLE HCL 2 MG PO TABS: ORAL_TABLET | ORAL | Status: DC

## 2012-09-03 DIAGNOSIS — M545 Low back pain: Secondary | ICD-10-CM | POA: Diagnosis not present

## 2012-09-03 DIAGNOSIS — M6281 Muscle weakness (generalized): Secondary | ICD-10-CM | POA: Diagnosis not present

## 2012-09-28 ENCOUNTER — Encounter: Payer: Self-pay | Admitting: Neurology

## 2012-09-28 DIAGNOSIS — G252 Other specified forms of tremor: Secondary | ICD-10-CM

## 2012-09-28 DIAGNOSIS — C50919 Malignant neoplasm of unspecified site of unspecified female breast: Secondary | ICD-10-CM

## 2012-09-28 DIAGNOSIS — H469 Unspecified optic neuritis: Secondary | ICD-10-CM

## 2012-09-28 DIAGNOSIS — G2581 Restless legs syndrome: Secondary | ICD-10-CM

## 2012-09-28 DIAGNOSIS — G544 Lumbosacral root disorders, not elsewhere classified: Secondary | ICD-10-CM

## 2012-09-28 DIAGNOSIS — M4120 Other idiopathic scoliosis, site unspecified: Secondary | ICD-10-CM

## 2012-09-28 DIAGNOSIS — IMO0002 Reserved for concepts with insufficient information to code with codable children: Secondary | ICD-10-CM

## 2012-09-28 DIAGNOSIS — H538 Other visual disturbances: Secondary | ICD-10-CM

## 2012-09-28 DIAGNOSIS — M79609 Pain in unspecified limb: Secondary | ICD-10-CM

## 2012-09-30 ENCOUNTER — Ambulatory Visit (INDEPENDENT_AMBULATORY_CARE_PROVIDER_SITE_OTHER): Payer: Medicare Other | Admitting: Neurology

## 2012-09-30 ENCOUNTER — Encounter: Payer: Self-pay | Admitting: Neurology

## 2012-09-30 VITALS — BP 124/82 | HR 82 | Ht 67.0 in | Wt 180.0 lb

## 2012-09-30 DIAGNOSIS — G2581 Restless legs syndrome: Secondary | ICD-10-CM | POA: Diagnosis not present

## 2012-09-30 DIAGNOSIS — D509 Iron deficiency anemia, unspecified: Secondary | ICD-10-CM | POA: Diagnosis not present

## 2012-09-30 DIAGNOSIS — IMO0002 Reserved for concepts with insufficient information to code with codable children: Secondary | ICD-10-CM | POA: Diagnosis not present

## 2012-09-30 MED ORDER — NORTRIPTYLINE HCL 10 MG PO CAPS: ORAL_CAPSULE | ORAL | Status: DC

## 2012-09-30 NOTE — Progress Notes (Signed)
Reason for visit: Low back pain  Shelley Coleman is an 72 y.o. female  History of present illness:  Shelley Coleman is a 72 year old right-handed white female with a history of chronic low back pain. The patient had lumbosacral spine surgery done in May of 2013, and she still has pain across the lower part of her back. The patient no longer has sciatica type pain down the right leg. The patient was felt to have some left-sided weakness involving the left leg following surgery. EMG evaluation showed a possible healing L5 radiculopathy. The patient is having difficulty sleeping in part secondary to restless leg syndrome and secondary to the chronic low back pain. In the past, she was tried on a TENS unit without benefit. The patient has not had a spinal stimulator evaluation. The patient has been on gabapentin, but she went off of the medication as it left her with a "hangover" the next day. The patient had difficulty with balance as well. The patient also reports some difficulty voiding the bladder, and she has frequent urinary tract infections. The patient has numbness and tingling in the feet. In the past, she has had a low ferritin level that required IV dextran. The patient returns to this office for an evaluation.  Past Medical History  Diagnosis Date  . Depression   . GERD (gastroesophageal reflux disease)   . Hyperlipidemia   . Cancer     breast  . Ovarian cyst   . Parotid tumor     right  . Shingles 2000    Left in V1  . Hx of mitral valve prolapse   . Irregular heart beat   . Restless legs syndrome   . Osteopenia   . Optic neuritis     left  . Pelvic fracture   . History of GI bleed   . Hiatal hernia     Past Surgical History  Procedure Laterality Date  . Appendectomy    . Abdominal hysterectomy    . Spine surgery    . Mastectomy Right   . Breast lumpectomy Left   . Total hip arthroplasty Left   . Hernia repair  08/2009    Family History  Problem Relation Age of Onset   . Cancer Mother     sarcoma  . Heart disease Father   . Hypertension Father   . Stroke Father   . Hernia Brother   . Hypertension Brother     Social history:  reports that she has never smoked. She does not have any smokeless tobacco history on file. She reports that  drinks alcohol. She reports that she does not use illicit drugs.  Allergies:  Allergies  Allergen Reactions  . Diflunisal     REACTION: rash , swelling  anaphlaxsis  . Tetanus Toxoid     REACTION: arm swelling, fever    Medications:  Current Outpatient Prescriptions on File Prior to Visit  Medication Sig Dispense Refill  . citalopram (CELEXA) 20 MG tablet Take 1 tablet (20 mg total) by mouth daily.  100 tablet  3  . HYDROcodone-acetaminophen (NORCO) 7.5-325 MG per tablet       . Multiple Vitamin (MULTIVITAMIN) tablet Take 1 tablet by mouth daily.      . nitroGLYCERIN (NITROSTAT) 0.4 MG SL tablet Place 0.4 mg under the tongue every 5 (five) minutes as needed.      Marland Kitchen omeprazole (PRILOSEC) 20 MG capsule Take 20 mg by mouth daily.      Marland Kitchen rOPINIRole (  REQUIP) 2 MG tablet Take one to one and one half tablet po at night for RLS  45 tablet  3  . vitamin E 200 UNIT capsule Take 200 Units by mouth daily.       No current facility-administered medications on file prior to visit.    ROS:  Out of a complete 14 system review of symptoms, the patient complains only of the following symptoms, and all other reviewed systems are negative.  Weight gain, fatigue Hearing loss, constipation Easy bruising, easy bleeding Joint pain, muscle cramps, achy muscles Numbness, weakness Depression, insomnia, decreased energy, restless legs  Blood pressure 124/82, pulse 82, height 5\' 7"  (1.702 m), weight 180 lb (81.647 kg).  Physical Exam  General: The patient is alert and cooperative at the time of the examination.  Skin: No significant peripheral edema is noted.   Neurologic Exam  Cranial nerves: Facial symmetry is present.  Speech is normal, no aphasia or dysarthria is noted. Extraocular movements are full. Visual fields are full.  Motor: The patient has good strength in all 4 extremities, with exception of some mild proximal weakness of the right leg, and some problems with inversion and eversion of the right foot. Good strength is seen on the left leg.  Coordination: The patient has good finger-nose-finger and heel-to-shin bilaterally.  Gait and station: The patient has a normal gait. Tandem gait is slightly unsteady. Romberg is negative. No drift is seen.  Reflexes: Deep tendon reflexes are symmetric, but are depressed.   Assessment/Plan:  1. Chronic low back pain  2. Restless leg syndrome  The patient will be set up for serum ferritin levels. The patient will be placed on nortriptyline at night for sleep and for the low back pain. In the future, a pain center referral may be in order to consider a spinal stimulator. The patient will followup through this office in about 6 months.  Marlan Palau MD 09/30/2012 9:05 PM  [facility] [address] [place], [place] [address]  Phone [phone] Fax [phone]

## 2012-10-01 ENCOUNTER — Telehealth: Payer: Self-pay | Admitting: Neurology

## 2012-10-01 LAB — FERRITIN, SERUM (SERIAL): Ferritin: 19 ng/mL (ref 15–150)

## 2012-10-01 LAB — FERRITIN: Ferritin: 19 ng/mL (ref 15–150)

## 2012-10-01 NOTE — Telephone Encounter (Signed)
I called the patient. The ferritin level was in the low normal range. I recommend that she go on iron supplementation. We will recheck iron levels in 3 months, and if they are not coming up, we will consider IV iron dextran.

## 2012-11-14 ENCOUNTER — Telehealth: Payer: Self-pay | Admitting: Neurology

## 2012-11-16 NOTE — Telephone Encounter (Signed)
I called patient. I recommended going on iron supplementation, ferrous sulfate 325 mg daily. We'll recheck iron levels in about 3 months.

## 2012-12-10 ENCOUNTER — Other Ambulatory Visit: Payer: Self-pay | Admitting: *Deleted

## 2012-12-10 MED ORDER — ROPINIROLE HCL 2 MG PO TABS: ORAL_TABLET | ORAL | Status: DC

## 2012-12-16 ENCOUNTER — Encounter: Payer: Self-pay | Admitting: *Deleted

## 2012-12-17 ENCOUNTER — Encounter: Payer: Self-pay | Admitting: Gastroenterology

## 2012-12-17 ENCOUNTER — Ambulatory Visit (INDEPENDENT_AMBULATORY_CARE_PROVIDER_SITE_OTHER): Payer: Medicare Other | Admitting: Gastroenterology

## 2012-12-17 VITALS — BP 100/60 | HR 80 | Ht 67.0 in | Wt 181.5 lb

## 2012-12-17 DIAGNOSIS — K219 Gastro-esophageal reflux disease without esophagitis: Secondary | ICD-10-CM

## 2012-12-17 DIAGNOSIS — Z9889 Other specified postprocedural states: Secondary | ICD-10-CM | POA: Diagnosis not present

## 2012-12-17 DIAGNOSIS — R1319 Other dysphagia: Secondary | ICD-10-CM | POA: Diagnosis not present

## 2012-12-17 MED ORDER — DEXLANSOPRAZOLE 60 MG PO CPDR: 60.0000 mg | DELAYED_RELEASE_CAPSULE | Freq: Every day | ORAL | Status: DC

## 2012-12-17 MED ORDER — SUCRALFATE 1 GM/10ML PO SUSP: 1.0000 g | Freq: Four times a day (QID) | ORAL | Status: DC | PRN

## 2012-12-17 NOTE — Progress Notes (Signed)
History of Present Illness:  This is a -year-old female who underwent fundoplication by Dr. Luretha Murphy years ago.  She's had problems since her surgery and now describes dysphagia, painful swallowing, and reflux symptoms.  In the last several weeks she been on over-the-counter Nexium with mild improvement.  Her symptoms of painful swallowing and dysphagia her mid substernal area.  She does use large amount of NSAIDs because of inflammatory conditions involving her back, she's had multiple orthopedic and neurosurgical back operations.  She also status post mastectomy has had chemotherapy with resultant peripheral neuropathies.  She is a Designer, jewellery continues to work.  Despite all these complaints is been no anorexia, weight loss, lower GI or hepatobiliary complaints.  She has had previous hysterectomy and appendectomy.  She has not had endoscopy or barium study since her surgery.  I have reviewed this patient's present history, medical and surgical past history, allergies and medications.     ROS:   All systems were reviewed and are negative unless otherwise stated in the HPI.    Physical Exam: At pressure 100/60, pulse 80 and regular, and weight 181 with a BMI of 28.42.  The patient has not smoked. General well developed well nourished patient in no acute distress, appearing their stated age Eyes PERRLA, no icterus, fundoscopic exam per opthamologist Skin no lesions noted Neck supple, no adenopathy, no thyroid enlargement, no tenderness Chest clear to percussion and auscultation Heart no significant murmurs, gallops or rubs noted Abdomen no hepatosplenomegaly masses or tenderness, BS normal.  Extremities no acute joint lesions, edema, phlebitis or evidence of cellulitis. Neurologic patient oriented x 3, cranial nerves intact, no focal neurologic deficits noted. Psychological mental status normal and normal affect.  Assessment and plan: Recurrent GERD and possible peptic stricture the  esophagus in a patient status post previous laparoscopic fundoplication.  She also may also have an underlying esophageal motility disorder.  I've scheduled endoscopy, and I suspect she will also need a high-resolution esophageal manometry.  Have changed her from Nexium to Dexilant 60 mg every morning and reviewed standard reflux precautions.  She is to continue other medications as listed and reviewed per primary care and her other multiple physicians.  Last endoscopy was in March of 2011.   cc Dr Luretha Murphy and Dr. Kelle Darting

## 2012-12-17 NOTE — Patient Instructions (Signed)
You have been scheduled for an endoscopy with propofol. Please follow written instructions given to you at your visit today. If you use inhalers (even only as needed), please bring them with you on the day of your procedure. Your physician has requested that you go to www.startemmi.com and enter the access code given to you at your visit today. This web site gives a general overview about your procedure. However, you should still follow specific instructions given to you by our office regarding your preparation for the procedure.  We have given you samples of the following medication to take: Dexilant 60 mg, please take one capsule by mouth once daily  We have sent the following medications to your pharmacy for you to pick up at your convenience: Carafate, please take as directed

## 2012-12-17 NOTE — Addendum Note (Signed)
Addended by: Ok Anis A on: 12/17/2012 03:35 PM   Modules accepted: Orders, Medications

## 2012-12-18 ENCOUNTER — Encounter: Payer: Self-pay | Admitting: Gastroenterology

## 2012-12-18 ENCOUNTER — Ambulatory Visit (AMBULATORY_SURGERY_CENTER): Payer: Medicare Other | Admitting: Gastroenterology

## 2012-12-18 VITALS — BP 136/78 | HR 66 | Temp 98.0°F | Resp 29 | Ht 67.0 in | Wt 181.0 lb

## 2012-12-18 DIAGNOSIS — Z9889 Other specified postprocedural states: Secondary | ICD-10-CM

## 2012-12-18 DIAGNOSIS — K219 Gastro-esophageal reflux disease without esophagitis: Secondary | ICD-10-CM | POA: Diagnosis not present

## 2012-12-18 DIAGNOSIS — F329 Major depressive disorder, single episode, unspecified: Secondary | ICD-10-CM | POA: Diagnosis not present

## 2012-12-18 DIAGNOSIS — I059 Rheumatic mitral valve disease, unspecified: Secondary | ICD-10-CM | POA: Diagnosis not present

## 2012-12-18 DIAGNOSIS — K209 Esophagitis, unspecified without bleeding: Secondary | ICD-10-CM

## 2012-12-18 DIAGNOSIS — R1319 Other dysphagia: Secondary | ICD-10-CM | POA: Diagnosis not present

## 2012-12-18 DIAGNOSIS — Z853 Personal history of malignant neoplasm of breast: Secondary | ICD-10-CM | POA: Diagnosis not present

## 2012-12-18 DIAGNOSIS — R1310 Dysphagia, unspecified: Secondary | ICD-10-CM | POA: Diagnosis not present

## 2012-12-18 MED ORDER — SODIUM CHLORIDE 0.9 % IV SOLN: 500.0000 mL | INTRAVENOUS | Status: DC

## 2012-12-18 NOTE — Patient Instructions (Addendum)
Discharge instructions given with verbal understanding. Handout on a dilatation diet. Biopsies taken. Resume previous medications. YOU HAD AN ENDOSCOPIC PROCEDURE TODAY AT THE [facility]: Refer to the procedure report that was given to you for any specific questions about what was found during the examination.  If the procedure report does not answer your questions, please call your gastroenterologist to clarify.  If you requested that your care partner not be given the details of your procedure findings, then the procedure report has been included in a sealed envelope for you to review at your convenience later.  YOU SHOULD EXPECT: Some feelings of bloating in the abdomen. Passage of more gas than usual.  Walking can help get rid of the air that was put into your GI tract during the procedure and reduce the bloating. If you had a lower endoscopy (such as a colonoscopy or flexible sigmoidoscopy) you may notice spotting of blood in your stool or on the toilet paper. If you underwent a bowel prep for your procedure, then you may not have a normal bowel movement for a few days.  DIET: Your first meal following the procedure should be a light meal and then it is ok to progress to your normal diet.  A half-sandwich or bowl of soup is an example of a good first meal.  Heavy or fried foods are harder to digest and may make you feel nauseous or bloated.  Likewise meals heavy in dairy and vegetables can cause extra gas to form and this can also increase the bloating.  Drink plenty of fluids but you should avoid alcoholic beverages for 24 hours.  ACTIVITY: Your care partner should take you home directly after the procedure.  You should plan to take it easy, moving slowly for the rest of the day.  You can resume normal activity the day after the procedure however you should NOT DRIVE or use heavy machinery for 24 hours (because of the sedation medicines used during the test).    SYMPTOMS TO REPORT  IMMEDIATELY: A gastroenterologist can be reached at any hour.  During normal business hours, 8:30 AM to 5:00 PM Monday through Friday, call [phone].  After hours and on weekends, please call the GI answering service at [phone] who will take a message and have the physician on call contact you.   Vomiting of blood or coffee ground material  New chest pain or pain under the shoulder blades  Painful or persistently difficult swallowing  New shortness of breath  Fever of 100F or higher  Black, tarry-looking stools  FOLLOW UP: If any biopsies were taken you will be contacted by phone or by letter within the next 1-3 weeks.  Call your gastroenterologist if you have not heard about the biopsies in 3 weeks.  Our staff will call the home number listed on your records the next business day following your procedure to check on you and address any questions or concerns that you may have at that time regarding the information given to you following your procedure. This is a courtesy call and so if there is no answer at the home number and we have not heard from you through the emergency physician on call, we will assume that you have returned to your regular daily activities without incident.  SIGNATURES/CONFIDENTIALITY: You and/or your care partner have signed paperwork which will be entered into your electronic medical record.  These signatures attest to the fact that that the information above on your After  Visit Summary has been reviewed and is understood.  Full responsibility of the confidentiality of this discharge information lies with you and/or your care-partner. 

## 2012-12-18 NOTE — Progress Notes (Signed)
Patient did not experience any of the following events: a burn prior to discharge; a fall within the facility; wrong site/side/patient/procedure/implant event; or a hospital transfer or hospital admission upon discharge from the facility. (G8907) Patient did not have preoperative order for IV antibiotic SSI prophylaxis. (G8918)  

## 2012-12-18 NOTE — Progress Notes (Signed)
Called to room to assist during endoscopic procedure.  Patient ID and intended procedure confirmed with present staff. Received instructions for my participation in the procedure from the performing physician.  

## 2012-12-18 NOTE — Op Note (Signed)
[facility] [address] [place], [address]   ENDOSCOPY PROCEDURE REPORT  PATIENT: Shelley Coleman, Shelley Coleman  MR#: [id number] BIRTHDATE: Sep 02, 1940 , 72  yrs. old GENDER: Female ENDOSCOPIST:Nnenna Meador Hale Bogus, MD, [facility] REFERRED BY: PROCEDURE DATE:  12/18/2012 PROCEDURE:   EGD w/ biopsy and Maloney dilation of esophagus ASA CLASS:    Class II INDICATIONS: Dysphagia and Epigastric pain,. prior fundoplication 3 years ago MEDICATION: propofol (Diprivan) 200mg  IV TOPICAL ANESTHETIC:  DESCRIPTION OF PROCEDURE:   After the risks and benefits of the procedure were explained, informed consent was obtained.  The LB [id number]  endoscope was introduced through the mouth  and advanced to the second portion of the duodenum .  The instrument was slowly withdrawn as the mucosa was fully examined.      DUODENUM: The duodenal mucosa showed no abnormalities in the bulb and second portion of the duodenum.  STOMACH: There was mild antral gastropathy noted.  Cold forcep biopsies were taken at the antrum and angularis.for CLO testing.  ESOPHAGUS: The esophageal fundoplication appeared intact.  There were some erosions in the distal esophagus which were photographed. The patient was dilated with a #52 [place] dilator without difficulty.    Retroflexed views revealed intact fundoplication. The scope was then withdrawn from the patient and the procedure completed.  COMPLICATIONS: There were no complications.   ENDOSCOPIC IMPRESSION: 1.   The duodenal mucosa showed no abnormalities in the bulb and second portion of the duodenum 2.   There was mild antral gastropathy noted [T2] probably from heavy NSAID use. 3.   The esophageal fundoplication appeared intact.  There were some erosions in the distal esophagus which were photographed.  The patient was dilated with a #52 [place] dilator without difficulty.  RECOMMENDATIONS: Continue PPI at twice a day dosages.   We also will schedule high-resolution esophageal manometry.we will treat her for H. pylori if her CLOtest is positive.    _______________________________ eSigned:  Mardella Layman, MD, [facility] 12/18/2012 3:25 PM   standard discharge CC: Dr. Luretha Murphy at [facility]  PATIENT NAME:  [name], [name] MR#: [id number]

## 2012-12-18 NOTE — Progress Notes (Signed)
Lidocaine-40mg IV prior to Propofol InductionPropofol given over incremental dosages 

## 2012-12-19 ENCOUNTER — Telehealth: Payer: Self-pay | Admitting: *Deleted

## 2012-12-19 LAB — HELICOBACTER PYLORI SCREEN-BIOPSY: UREASE: NEGATIVE

## 2012-12-19 NOTE — Telephone Encounter (Signed)
  Follow up Call-  Call back number 12/18/2012  Post procedure Call Back phone  # [phone]  Permission to leave phone message Yes     Patient questions:  Do you have a fever, pain , or abdominal swelling? no Pain Score  0 *  Have you tolerated food without any problems? yes  Have you been able to return to your normal activities? yes  Do you have any questions about your discharge instructions: Diet   no Medications  no Follow up visit  no  Do you have questions or concerns about your Care? no  Actions: * If pain score is 4 or above: No action needed, pain <4.

## 2012-12-20 ENCOUNTER — Encounter: Payer: Self-pay | Admitting: Gastroenterology

## 2012-12-24 DIAGNOSIS — M51379 Other intervertebral disc degeneration, lumbosacral region without mention of lumbar back pain or lower extremity pain: Secondary | ICD-10-CM | POA: Diagnosis not present

## 2012-12-24 DIAGNOSIS — Z981 Arthrodesis status: Secondary | ICD-10-CM | POA: Diagnosis not present

## 2012-12-24 DIAGNOSIS — M5137 Other intervertebral disc degeneration, lumbosacral region: Secondary | ICD-10-CM | POA: Diagnosis not present

## 2012-12-27 ENCOUNTER — Telehealth: Payer: Self-pay | Admitting: *Deleted

## 2012-12-27 NOTE — Telephone Encounter (Signed)
Called pt to schedule EM, but she is out of town per husband; will call back on 12/31/12.

## 2012-12-31 ENCOUNTER — Encounter: Payer: Self-pay | Admitting: *Deleted

## 2012-12-31 ENCOUNTER — Telehealth: Payer: Self-pay | Admitting: *Deleted

## 2012-12-31 MED ORDER — DEXLANSOPRAZOLE 60 MG PO CPDR: 60.0000 mg | DELAYED_RELEASE_CAPSULE | Freq: Every day | ORAL | Status: DC

## 2012-12-31 NOTE — Telephone Encounter (Signed)
Opened in error

## 2012-12-31 NOTE — Telephone Encounter (Signed)
Ordered pt's Dexilant. Scheduled her EM for 01/13/13 and mailed prep instructions to her.

## 2013-01-07 ENCOUNTER — Other Ambulatory Visit: Payer: Self-pay | Admitting: *Deleted

## 2013-01-07 ENCOUNTER — Other Ambulatory Visit: Payer: Self-pay | Admitting: Family Medicine

## 2013-01-07 MED ORDER — NITROGLYCERIN 0.4 MG SL SUBL: 0.4000 mg | SUBLINGUAL_TABLET | SUBLINGUAL | Status: DC | PRN

## 2013-01-13 ENCOUNTER — Encounter (HOSPITAL_COMMUNITY)
Admission: RE | Disposition: A | Discharge status: Home / Self Care | Payer: Self-pay | Source: Ambulatory Visit | Attending: Gastroenterology

## 2013-01-13 ENCOUNTER — Ambulatory Visit (HOSPITAL_COMMUNITY)
Admission: RE | Admit: 2013-01-13 | Discharge: 2013-01-13 | Disposition: A | Discharge status: Home / Self Care | Payer: Medicare Other | Source: Ambulatory Visit | Attending: Gastroenterology | Admitting: Gastroenterology

## 2013-01-13 DIAGNOSIS — R0609 Other forms of dyspnea: Secondary | ICD-10-CM | POA: Insufficient documentation

## 2013-01-13 DIAGNOSIS — R1310 Dysphagia, unspecified: Secondary | ICD-10-CM | POA: Diagnosis not present

## 2013-01-13 DIAGNOSIS — R0989 Other specified symptoms and signs involving the circulatory and respiratory systems: Secondary | ICD-10-CM | POA: Insufficient documentation

## 2013-01-13 HISTORY — PX: ESOPHAGEAL MANOMETRY: SHX5429

## 2013-01-13 SURGERY — MANOMETRY, ESOPHAGUS
Anesthesia: Topical

## 2013-01-13 MED ORDER — LIDOCAINE VISCOUS 2 % MT SOLN
OROMUCOSAL | Status: AC
  Filled 2013-01-13: qty 15

## 2013-01-13 SURGICAL SUPPLY — 4 items
DRAPE UTILITY 15X26 W/TAPE STR (DRAPE) ×2 IMPLANT
GLOVE BIOGEL PI IND STRL 8 (GLOVE) ×1 IMPLANT
GLOVE BIOGEL PI INDICATOR 8 (GLOVE) ×1
GOWN PREVENTION PLUS LG XLONG (DISPOSABLE) ×2 IMPLANT

## 2013-01-14 ENCOUNTER — Encounter (HOSPITAL_COMMUNITY): Payer: Self-pay | Admitting: Gastroenterology

## 2013-01-22 ENCOUNTER — Ambulatory Visit (INDEPENDENT_AMBULATORY_CARE_PROVIDER_SITE_OTHER): Payer: Medicare Other

## 2013-01-22 DIAGNOSIS — Z23 Encounter for immunization: Secondary | ICD-10-CM

## 2013-01-30 ENCOUNTER — Telehealth: Payer: Self-pay | Admitting: *Deleted

## 2013-01-30 NOTE — Telephone Encounter (Signed)
Informed pt of EM results and offered her an appt to discuss the report with Dr Jarold Motto. Informed her there is no evidence of impaired peristalsis and esophageal impedance is normal; there is mild functional obstruction of the LES. She asked if anything can be done and I informed her I don't think so. She will call for worsening problems or questions.

## 2013-01-31 ENCOUNTER — Telehealth: Payer: Self-pay | Admitting: Family Medicine

## 2013-01-31 NOTE — Telephone Encounter (Signed)
Pt decline appt. Pt stated she has a bladder inf and requesting cipro call into [facility]

## 2013-01-31 NOTE — Telephone Encounter (Signed)
Pt would like rachel to return her call

## 2013-01-31 NOTE — Telephone Encounter (Signed)
Per Dr Tawanna Cooler - Shelley Coleman will need an office visit for a Rx for Cipro.  Shelley Coleman is aware.

## 2013-04-09 ENCOUNTER — Ambulatory Visit (INDEPENDENT_AMBULATORY_CARE_PROVIDER_SITE_OTHER): Payer: Medicare Other | Admitting: Neurology

## 2013-04-09 ENCOUNTER — Encounter: Payer: Self-pay | Admitting: Neurology

## 2013-04-09 VITALS — BP 122/75 | HR 72 | Wt 184.0 lb

## 2013-04-09 DIAGNOSIS — F329 Major depressive disorder, single episode, unspecified: Secondary | ICD-10-CM

## 2013-04-09 DIAGNOSIS — F3289 Other specified depressive episodes: Secondary | ICD-10-CM | POA: Diagnosis not present

## 2013-04-09 DIAGNOSIS — M549 Dorsalgia, unspecified: Secondary | ICD-10-CM | POA: Diagnosis not present

## 2013-04-09 DIAGNOSIS — D509 Iron deficiency anemia, unspecified: Secondary | ICD-10-CM | POA: Diagnosis not present

## 2013-04-09 DIAGNOSIS — G2581 Restless legs syndrome: Secondary | ICD-10-CM

## 2013-04-09 MED ORDER — DULOXETINE HCL 30 MG PO CPEP: ORAL_CAPSULE | ORAL | Status: DC

## 2013-04-09 NOTE — Progress Notes (Signed)
Reason for visit: Restless leg syndrome  Shelley Coleman is an 72 y.o. female  History of present illness:  Shelley Coleman is a 72 year old right-handed white female with a history of chronic low back pain, depression, restless leg syndrome, and chronic insomnia. The patient has had a low normal ferritin level, and she is on iron supplementation, but this is causing stomach upset. The patient in the past has required IV iron dextran. The patient indicates that her legs will jump and jerk and twitch throughout the night, and sometimes during the day as well. The patient is on Celexa taking 20 mg daily. The patient was given a trial on nortriptyline for sleep and for her neuromuscular pain, but she claims that she cannot tolerate doses greater than 10 mg at night secondary to increased fatigue during the day. The patient returns to the office today for an evaluation. The patient is on Requip taking a 2 mg tablet, 1.5 tablets at night. The patient indicates that she has had some double vision off and on over the last 6 months, but she indicates that she has a known problem with strabismus, and this has been an issue throughout her life. The double vision may have worsened while on nortriptyline.  Past Medical History  Diagnosis Date  . Depression   . GERD (gastroesophageal reflux disease)   . Hyperlipidemia   . Breast cancer   . Ovarian cyst   . Parotid tumor     right  . Shingles 2000    Left in V1  . Hx of mitral valve prolapse   . Irregular heart beat   . Restless legs syndrome   . Osteopenia   . Optic neuritis     left  . Pelvic fracture   . History of GI bleed   . Hiatal hernia   . Reflux esophagitis     Past Surgical History  Procedure Laterality Date  . Appendectomy    . Abdominal hysterectomy    . Spine surgery    . Mastectomy Right   . Breast lumpectomy Left   . Total hip arthroplasty Left   . Hernia repair  08/2009  . Esophageal manometry N/A 01/13/2013    Procedure:  ESOPHAGEAL MANOMETRY (EM);  Surgeon: Mardella Layman, MD;  Location: [facility];  Service: Endoscopy;  Laterality: N/A;    Family History  Problem Relation Age of Onset  . Cancer Mother     sarcoma  . Heart disease Father   . Hypertension Father   . Stroke Father   . Hernia Brother   . Hypertension Brother     Social history:  reports that she has never smoked. She has never used smokeless tobacco. She reports that she drinks alcohol. She reports that she does not use illicit drugs.    Allergies  Allergen Reactions  . Diflunisal     REACTION: rash , swelling  anaphlaxsis  . Tetanus Toxoid     REACTION: arm swelling, fever    Medications:  Current Outpatient Prescriptions on File Prior to Visit  Medication Sig Dispense Refill  . atenolol (TENORMIN) 50 MG tablet Take 50 mg by mouth daily. As needed      . dexlansoprazole (DEXILANT) 60 MG capsule Take 1 capsule (60 mg total) by mouth daily.  90 capsule  2  . ferrous sulfate 325 (65 FE) MG tablet Take 325 mg by mouth daily with breakfast.      . Gabapentin Enacarbil (HORIZANT) 600 MG TB24  Take by mouth.      . hydrochlorothiazide (HYDRODIURIL) 25 MG tablet Take 25 mg by mouth daily. As needed      . HYDROcodone-acetaminophen (NORCO) 7.5-325 MG per tablet Take 1 tablet by mouth every 6 (six) hours as needed.       . Ibuprofen (ADVIL PO) Take 200 mg by mouth every 4 (four) hours as needed. Take 100mg  every 6 hours as needed      . Multiple Vitamin (MULTIVITAMIN) tablet Take 1 tablet by mouth daily.      . nitroGLYCERIN (NITROSTAT) 0.4 MG SL tablet Place 1 tablet (0.4 mg total) under the tongue every 5 (five) minutes as needed.  90 tablet  0  . rOPINIRole (REQUIP) 2 MG tablet Take one to one and one half tablet po at night for RLS  45 tablet  3  . sucralfate (CARAFATE) 1 GM/10ML suspension Take 10 mL (1 g total) by mouth 4 (four) times daily as needed.  420 mL  0  . vitamin C (ASCORBIC ACID) 500 MG tablet Take 500 mg by mouth  daily.      . vitamin E 200 UNIT capsule Take 200 Units by mouth daily.      . citalopram (CELEXA) 20 MG tablet Take 20 mg by mouth at bedtime.       No current facility-administered medications on file prior to visit.    ROS:  Out of a complete 14 system review of symptoms, the patient complains only of the following symptoms, and all other reviewed systems are negative.  Fatigue Ringing in the ears Double vision Palpitations of the heart Constipation Restless legs, daytime drowsiness Achy muscles, muscle cramps, walking difficulties Anemia Numbness, weakness Depression  Blood pressure 122/75, pulse 72, weight 184 lb (83.462 kg).  Physical Exam  General: The patient is alert and cooperative at the time of the examination.  Neuromuscular: The patient has good range of movement of the lumbosacral spine.  Skin: No significant peripheral edema is noted.   Neurologic Exam  Mental status: The patient is oriented x 3.  Cranial nerves: Facial symmetry is present. Speech is normal, no aphasia or dysarthria is noted. Extraocular movements are full. Visual fields are full.  Motor: The patient has good strength in all 4 extremities.  Sensory examination: Soft touch sensation is symmetric on the face, arms, and legs, although the patient reports decreased sensation in general in the legs.  Coordination: The patient has good finger-nose-finger and heel-to-shin bilaterally.  Gait and station: The patient has a normal gait. Tandem gait is normal. Romberg is negative. No drift is seen.  Reflexes: Deep tendon reflexes are symmetric.   Assessment/Plan:  1. Restless leg syndrome  2. Peripheral neuropathy  3. Chronic low back pain  The patient will be sent for blood work to check the ferritin levels again. If these levels are not improving, the patient will be sent for IV iron dextran therapy. The patient will be taken off of Celexa, as this medication can cause periodic limb  movements, and can exacerbate the lower extremity jerks that may be keeping her awake at night. Treating the insomnia is important to improving her energy level during the day, and improving her depression. The patient will be switched to Cymbalta, and she will be taken off of nortriptyline. The patient will followup in 4-6 months.  Marlan Palau MD 04/09/2013 8:14 PM  [facility] [address] [place], [place] [address]  Phone [phone] Fax [phone]

## 2013-04-09 NOTE — Patient Instructions (Addendum)
Stop the nortriptyline, reduce the Celexa to 10 mg daily for 2 weeks, then stop. We will start the cymbalta when the celexa is reduced.Restless Legs Syndrome Restless legs syndrome is a movement disorder. It may also be called a sensori-motor disorder.  CAUSES  No one knows what specifically causes restless legs syndrome, but it tends to run in families. It is also more common in people with low iron, in pregnancy, in people who need dialysis, and those with nerve damage (neuropathy).Some medications may make restless legs syndrome worse.Those medications include drugs to treat high blood pressure, some heart conditions, nausea, colds, allergies, and depression. SYMPTOMS Symptoms include uncomfortable sensations in the legs. These leg sensations are worse during periods of inactivity or rest. They are also worse while sitting or lying down. Individuals that have the disorder describe sensations in the legs that feel like:  Pulling.  Drawing.  Crawling.  Worming.  Boring.  Tingling.  Pins and needles.  Prickling.  Pain. The sensations are usually accompanied by an overwhelming urge to move the legs. Sudden muscle jerks may also occur. Movement provides temporary relief from the discomfort. In rare cases, the arms may also be affected. Symptoms may interfere with going to sleep (sleep onset insomnia). Restless legs syndrome may also be related to periodic limb movement disorder (PLMD). PLMD is another more common motor disorder. It also causes interrupted sleep. The symptoms from PLMD usually occur most often when you are awake. TREATMENT  Treatment for restless legs syndrome is symptomatic. This means that the symptoms are treated.   Massage and cold compresses may provide temporary relief.  Walk, stretch, or take a cold or hot bath.  Get regular exercise and a good night's sleep.  Avoid caffeine, alcohol, nicotine, and medications that can make it worse.  Do activities that  provide mental stimulation like discussions, needlework, and video games. These may be helpful if you are not able to walk or stretch. Some medications are effective in relieving the symptoms. However, many of these medications have side effects. Ask your caregiver about medications that may help your symptoms. Correcting iron deficiency may improve symptoms for some patients. Document Released: 04/07/2002 Document Revised: 07/10/2011 Document Reviewed: 07/14/2010 [facility] Patient Information 2014 [place], [place].

## 2013-04-10 ENCOUNTER — Telehealth: Payer: Self-pay | Admitting: Neurology

## 2013-04-10 ENCOUNTER — Other Ambulatory Visit: Payer: Self-pay | Admitting: Neurology

## 2013-04-10 DIAGNOSIS — D509 Iron deficiency anemia, unspecified: Secondary | ICD-10-CM

## 2013-04-10 DIAGNOSIS — G2581 Restless legs syndrome: Secondary | ICD-10-CM

## 2013-04-10 LAB — IRON AND TIBC
Iron Saturation: 24 % (ref 15–55)
Iron: 95 ug/dL (ref 35–155)
TIBC: 392 ug/dL (ref 250–450)

## 2013-04-10 LAB — FERRITIN: Ferritin: 17 ng/mL (ref 15–150)

## 2013-04-10 NOTE — Telephone Encounter (Signed)
IV dextran infusion?

## 2013-04-10 NOTE — Telephone Encounter (Signed)
I called the patient. The ferritin level still in the low normal range, dropped from 19-17. I will get the patient set up for IV dextran.

## 2013-04-11 NOTE — Telephone Encounter (Signed)
I spoke to pt last evening and relayed that I would call her this am about this infusion.  I called and made appt for pt at [facility] on 04-15-13 at 0700.  This will be an 6 hour infusion.   (she will receive test dose then wait an hour then after this if no problems will resume for another 5 hours to complete the infusion).  Pt informed with verbal understanding.   I spoke to [place] in Admitting re: auth and he relayed no prior authorization needed with traditional MCR and AARP.  [id number].    [facility] to call back if questions relating to order.

## 2013-04-15 ENCOUNTER — Encounter (HOSPITAL_COMMUNITY): Payer: Self-pay

## 2013-04-15 ENCOUNTER — Encounter (HOSPITAL_COMMUNITY)
Admission: RE | Admit: 2013-04-15 | Discharge: 2013-04-15 | Disposition: A | Discharge status: Home / Self Care | Payer: Medicare Other | Source: Ambulatory Visit | Attending: Neurology | Admitting: Neurology

## 2013-04-15 ENCOUNTER — Other Ambulatory Visit (HOSPITAL_COMMUNITY): Payer: Self-pay | Admitting: Neurology

## 2013-04-15 VITALS — BP 136/78 | HR 69 | Temp 98.0°F | Resp 17 | Ht 67.5 in | Wt 184.0 lb

## 2013-04-15 DIAGNOSIS — D649 Anemia, unspecified: Secondary | ICD-10-CM | POA: Diagnosis not present

## 2013-04-15 DIAGNOSIS — G2581 Restless legs syndrome: Secondary | ICD-10-CM

## 2013-04-15 DIAGNOSIS — D509 Iron deficiency anemia, unspecified: Secondary | ICD-10-CM

## 2013-04-15 MED ORDER — SODIUM CHLORIDE 0.9 % IV SOLN
500.0000 mg | Freq: Every day | INTRAVENOUS | Status: DC
  Administered 2013-04-15: 500 mg via INTRAVENOUS
  Filled 2013-04-15: qty 10

## 2013-04-15 MED ORDER — SODIUM CHLORIDE 0.9 % IV SOLN
INTRAVENOUS | Status: DC
  Administered 2013-04-15: 250 mL via INTRAVENOUS

## 2013-04-15 MED ORDER — SODIUM CHLORIDE 0.9 % IV SOLN
25.0000 mg | Freq: Once | INTRAVENOUS | Status: AC
  Administered 2013-04-15: 25 mg via INTRAVENOUS
  Filled 2013-04-15: qty 0.5

## 2013-05-24 ENCOUNTER — Other Ambulatory Visit: Payer: Self-pay | Admitting: Neurology

## 2013-06-09 ENCOUNTER — Telehealth: Payer: Self-pay | Admitting: Neurology

## 2013-06-09 MED ORDER — HYDROCODONE-ACETAMINOPHEN 7.5-325 MG PO TABS: 1.0000 | ORAL_TABLET | Freq: Four times a day (QID) | ORAL | Status: DC | PRN

## 2013-06-09 NOTE — Telephone Encounter (Signed)
Pt called in needing a written prescription of her Hydrocodone.  She stated that Liane Comber is her neighbor and wonder's if it would be possible for [place] to bring the prescription to her.  Her phone number is [phone].  Please contact as necessary.

## 2013-06-09 NOTE — Telephone Encounter (Signed)
This is a former [facility] patient who was assigned to Dr Jannifer Franklin.  Per [employer], he was prescribing Hydrocodone/APAP 5/300 mg one every six hours when necessary for pain #120.  Would you like to refill?  Please advise.  Thank you.

## 2013-06-09 NOTE — Telephone Encounter (Signed)
I will refill the prescription. The patient will be seen in June of 2015.

## 2013-06-10 ENCOUNTER — Other Ambulatory Visit: Payer: Self-pay | Admitting: Neurology

## 2013-06-10 NOTE — Telephone Encounter (Signed)
Called patient to inform her that her Rx was ready to be picked up at the front desk and if she has any other problems, questions or concerns to call the office. Patient verbalized understanding. °

## 2013-06-16 ENCOUNTER — Telehealth: Payer: Self-pay | Admitting: Family Medicine

## 2013-06-16 NOTE — Telephone Encounter (Signed)
Pt is needed an appt today if possible states she has a fever, nausea, and very bad headache, pt declined triage nurse and requesting to see dr. Sherren Mocha anytime today as soon as possible. Ok to schedule?

## 2013-06-16 NOTE — Telephone Encounter (Signed)
Spoke with patient. She complains of headache, nausea, low grade fever for 3 days.  No cough, abdominal pain, dysuria, or diarrhea.  Patient was advised per Dr Sherren Mocha to continue taking advil and plenty of fluids for viral symptoms.  Patient was also advised to go to urgent care if symptoms continue.

## 2013-06-18 ENCOUNTER — Encounter: Payer: Self-pay | Admitting: Family Medicine

## 2013-06-18 ENCOUNTER — Ambulatory Visit (INDEPENDENT_AMBULATORY_CARE_PROVIDER_SITE_OTHER): Payer: Medicare Other | Admitting: Family Medicine

## 2013-06-18 VITALS — BP 132/78 | HR 80 | Temp 97.8°F | Wt 188.0 lb

## 2013-06-18 DIAGNOSIS — R509 Fever, unspecified: Secondary | ICD-10-CM

## 2013-06-18 LAB — POCT URINALYSIS DIPSTICK
Glucose, UA: NEGATIVE
Nitrite, UA: NEGATIVE
PH UA: 6
Spec Grav, UA: 1.025
Urobilinogen, UA: 4

## 2013-06-18 NOTE — Progress Notes (Signed)
Pre visit review using our clinic review tool, if applicable. No additional management support is needed unless otherwise documented below in the visit note. 

## 2013-06-18 NOTE — Progress Notes (Signed)
Subjective:    Patient ID: Shelley Coleman, female    DOB: 1940-05-14, 73 y.o.   MRN: [id number]  Fever  Associated symptoms include headaches. Pertinent negatives include no abdominal pain, chest pain, congestion, coughing, diarrhea, nausea, rash, sore throat or vomiting.   Patient seen with chief complaint of fever. She had also relatively acute fever this past Friday and shaking chill with up to 102.8 temperature. And she reported body aches and intermittent headache but denies any cough, sore throat, or nasal congestion. She's had intermittent fevers up around101 since Friday. She has not had any fever since this morning. Has taken Advil which does relieve her fever. She denies any neck stiffness, skin rash, adenopathy, cough, abdominal pain, dysuria, nausea, vomiting, or diarrhea. She's had some generalized fatigue.  Denies any specific arthralgias. She has a remote history of breast cancer and has some chronic peripheral neuropathy related to prior chemotherapy treatment.  Past Medical History  Diagnosis Date  . Depression   . GERD (gastroesophageal reflux disease)   . Hyperlipidemia   . Breast cancer   . Ovarian cyst   . Parotid tumor     right  . Shingles 2000    Left in V1  . Hx of mitral valve prolapse   . Irregular heart beat   . Restless legs syndrome   . Osteopenia   . Optic neuritis     left  . Pelvic fracture   . History of GI bleed   . Hiatal hernia   . Reflux esophagitis    Past Surgical History  Procedure Laterality Date  . Appendectomy    . Abdominal hysterectomy    . Spine surgery    . Mastectomy Right   . Breast lumpectomy Left   . Total hip arthroplasty Left   . Hernia repair  08/2009  . Esophageal manometry N/A 01/13/2013    Procedure: ESOPHAGEAL MANOMETRY (EM);  Surgeon: Sable Feil, MD;  Location: [facility];  Service: Endoscopy;  Laterality: N/A;    reports that she has never smoked. She has never used smokeless tobacco. She reports that  she drinks alcohol. She reports that she does not use illicit drugs. family history includes Cancer in her mother; Heart disease in her father; Hernia in her brother; Hypertension in her brother and father; Stroke in her father. Allergies  Allergen Reactions  . Diflunisal     REACTION: rash , swelling  anaphlaxsis  . Tetanus Toxoid     REACTION: arm swelling, fever      Review of Systems  Constitutional: Positive for fever.  HENT: Negative for congestion and sore throat.   Respiratory: Negative for cough.   Cardiovascular: Negative for chest pain.  Gastrointestinal: Negative for nausea, vomiting, abdominal pain and diarrhea.  Genitourinary: Negative for dysuria.  Skin: Negative for rash.  Neurological: Positive for headaches.  Hematological: Negative for adenopathy.       Objective:   Physical Exam  Constitutional: She is oriented to person, place, and time. She appears well-developed and well-nourished.  HENT:  Right Ear: External ear normal.  Left Ear: External ear normal.  Mouth/Throat: Oropharynx is clear and moist.  Neck: Neck supple.  Cardiovascular: Normal rate.   Pulmonary/Chest: Effort normal and breath sounds normal. No respiratory distress. She has no wheezes. She has no rales.  Abdominal: Soft. She exhibits no distension and no mass. There is no tenderness. There is no rebound and no guarding.  Lymphadenopathy:    She has no cervical adenopathy.  Neurological: She is alert and oriented to person, place, and time.  Skin: No rash noted.          Assessment & Plan:  Patient presents with a six-day history of fever. Nonfocal exam. Most likely viral but duration of fever is somewhat atypical. Check urinalysis. Check CBC. She appears nontoxic. No cough to suggest pneumonia.

## 2013-06-18 NOTE — Patient Instructions (Signed)
Fever, Adult A fever is a higher than normal body temperature. In an adult, an oral temperature around 98.6 F (37 C) is considered normal. A temperature of 100.4 F (38 C) or higher is generally considered a fever. Mild or moderate fevers generally have no long-term effects and often do not require treatment. Extreme fever (greater than or equal to 106 F or 41.1 C) can cause seizures. The sweating that may occur with repeated or prolonged fever may cause dehydration. Elderly people can develop confusion during a fever. A measured temperature can vary with:  Age.  Time of day.  Method of measurement (mouth, underarm, rectal, or ear). The fever is confirmed by taking a temperature with a thermometer. Temperatures can be taken different ways. Some methods are accurate and some are not.  An oral temperature is used most commonly. Electronic thermometers are fast and accurate.  An ear temperature will only be accurate if the thermometer is positioned as recommended by the manufacturer.  A rectal temperature is accurate and done for those adults who have a condition where an oral temperature cannot be taken.  An underarm (axillary) temperature is not accurate and not recommended. Fever is a symptom, not a disease.  CAUSES   Infections commonly cause fever.  Some noninfectious causes for fever include:  Some arthritis conditions.  Some thyroid or adrenal gland conditions.  Some immune system conditions.  Some types of cancer.  A medicine reaction.  High doses of certain street drugs such as methamphetamine.  Dehydration.  Exposure to high outside or room temperatures.  Occasionally, the source of a fever cannot be determined. This is sometimes called a "fever of unknown origin" (FUO).  Some situations may lead to a temporary rise in body temperature that may go away on its own. Examples are:  Childbirth.  Surgery.  Intense exercise. HOME CARE INSTRUCTIONS   Take  appropriate medicines for fever. Follow dosing instructions carefully. If you use acetaminophen to reduce the fever, be careful to avoid taking other medicines that also contain acetaminophen. Do not take aspirin for a fever if you are younger than age 19. There is an association with Reye's syndrome. Reye's syndrome is a rare but potentially deadly disease.  If an infection is present and antibiotics have been prescribed, take them as directed. Finish them even if you start to feel better.  Rest as needed.  Maintain an adequate fluid intake. To prevent dehydration during an illness with prolonged or recurrent fever, you may need to drink extra fluid.Drink enough fluids to keep your urine clear or pale yellow.  Sponging or bathing with room temperature water may help reduce body temperature. Do not use ice water or alcohol sponge baths.  Dress comfortably, but do not over-bundle. SEEK MEDICAL CARE IF:   You are unable to keep fluids down.  You develop vomiting or diarrhea.  You are not feeling at least partly better after 3 days.  You develop new symptoms or problems. SEEK IMMEDIATE MEDICAL CARE IF:   You have shortness of breath or trouble breathing.  You develop excessive weakness.  You are dizzy or you faint.  You are extremely thirsty or you are making little or no urine.  You develop new pain that was not there before (such as in the head, neck, chest, back, or abdomen).  You have persistant vomiting and diarrhea for more than 1 to 2 days.  You develop a stiff neck or your eyes become sensitive to light.  You develop a   skin rash.  You have a fever or persistent symptoms for more than 2 to 3 days.  You have a fever and your symptoms suddenly get worse. MAKE SURE YOU:   Understand these instructions.  Will watch your condition.  Will get help right away if you are not doing well or get worse. Document Released: 10/11/2000 Document Revised: 07/10/2011 Document  Reviewed: 02/16/2011 [employer] Patient Information 2014 [employer].  

## 2013-06-19 ENCOUNTER — Telehealth: Payer: Self-pay | Admitting: Family Medicine

## 2013-06-19 LAB — CBC WITH DIFFERENTIAL/PLATELET
BASOS PCT: 0.4 % (ref 0.0–3.0)
Basophils Absolute: 0 10*3/uL (ref 0.0–0.1)
EOS PCT: 2.8 % (ref 0.0–5.0)
Eosinophils Absolute: 0.2 10*3/uL (ref 0.0–0.7)
HCT: 41.6 % (ref 36.0–46.0)
Hemoglobin: 13.6 g/dL (ref 12.0–15.0)
Lymphocytes Relative: 19.1 % (ref 12.0–46.0)
Lymphs Abs: 1.3 10*3/uL (ref 0.7–4.0)
MCHC: 32.7 g/dL (ref 30.0–36.0)
MCV: 89.8 fl (ref 78.0–100.0)
MONO ABS: 0.8 10*3/uL (ref 0.1–1.0)
Monocytes Relative: 11.8 % (ref 3.0–12.0)
NEUTROS ABS: 4.6 10*3/uL (ref 1.4–7.7)
Neutrophils Relative %: 65.9 % (ref 43.0–77.0)
Platelets: 237 10*3/uL (ref 150.0–400.0)
RBC: 4.63 Mil/uL (ref 3.87–5.11)
RDW: 15.5 % — ABNORMAL HIGH (ref 11.5–14.6)
WBC: 7.1 10*3/uL (ref 4.5–10.5)

## 2013-06-19 LAB — HEPATIC FUNCTION PANEL
ALBUMIN: 3.7 g/dL (ref 3.5–5.2)
ALK PHOS: 113 U/L (ref 39–117)
ALT: 49 U/L — AB (ref 0–35)
AST: 49 U/L — AB (ref 0–37)
Bilirubin, Direct: 0 mg/dL (ref 0.0–0.3)
Total Bilirubin: 0.4 mg/dL (ref 0.3–1.2)
Total Protein: 7.4 g/dL (ref 6.0–8.3)

## 2013-06-19 LAB — SEDIMENTATION RATE: Sed Rate: 69 mm/hr — ABNORMAL HIGH (ref 0–22)

## 2013-06-19 LAB — URINE CULTURE
COLONY COUNT: NO GROWTH
Organism ID, Bacteria: NO GROWTH

## 2013-06-19 NOTE — Telephone Encounter (Signed)
Informed patient that when Dr. Elease Hashimoto sends me the lab results i will give her a call

## 2013-06-19 NOTE — Telephone Encounter (Signed)
Pt is waiting for blood work results from yesterday. Pt saw burchette

## 2013-06-21 ENCOUNTER — Telehealth: Payer: Self-pay | Admitting: Family Medicine

## 2013-06-21 NOTE — Telephone Encounter (Signed)
[facility] called me to report that pt was calling them with no change in her symptoms that she was seen for last week (Dr. Volney American HA, daily fever, malaise. No new sx's.  No sx's to suggest need for urgent re-eval.   Pt hesitant to go to ED or urgent care to be seen this weekend. Pt worried about next step in w/u given her ongoing sx's + hx of cancer. I recommended she call PMD's office first thing this week to either schedule re-eval in office OR see If MD wanted to proceed with further w/u as outpt. Pt to go to ED if feeling worse or having new worrisome sx's this weekend.

## 2013-06-23 ENCOUNTER — Telehealth: Payer: Self-pay | Admitting: Family Medicine

## 2013-06-23 ENCOUNTER — Encounter: Payer: Self-pay | Admitting: Family Medicine

## 2013-06-23 ENCOUNTER — Ambulatory Visit (INDEPENDENT_AMBULATORY_CARE_PROVIDER_SITE_OTHER): Payer: Medicare Other | Admitting: Family Medicine

## 2013-06-23 VITALS — BP 128/82 | HR 73 | Temp 98.5°F | Wt 188.0 lb

## 2013-06-23 DIAGNOSIS — R110 Nausea: Secondary | ICD-10-CM | POA: Diagnosis not present

## 2013-06-23 DIAGNOSIS — R7402 Elevation of levels of lactic acid dehydrogenase (LDH): Secondary | ICD-10-CM | POA: Diagnosis not present

## 2013-06-23 DIAGNOSIS — R509 Fever, unspecified: Secondary | ICD-10-CM

## 2013-06-23 DIAGNOSIS — R740 Nonspecific elevation of levels of transaminase and lactic acid dehydrogenase [LDH]: Secondary | ICD-10-CM

## 2013-06-23 DIAGNOSIS — R7401 Elevation of levels of liver transaminase levels: Secondary | ICD-10-CM | POA: Diagnosis not present

## 2013-06-23 LAB — HEPATIC FUNCTION PANEL
ALK PHOS: 137 U/L — AB (ref 39–117)
ALT: 41 U/L — ABNORMAL HIGH (ref 0–35)
AST: 38 U/L — ABNORMAL HIGH (ref 0–37)
Albumin: 3.4 g/dL — ABNORMAL LOW (ref 3.5–5.2)
Bilirubin, Direct: 0 mg/dL (ref 0.0–0.3)
TOTAL PROTEIN: 7.2 g/dL (ref 6.0–8.3)
Total Bilirubin: 0.3 mg/dL (ref 0.3–1.2)

## 2013-06-23 LAB — CBC WITH DIFFERENTIAL/PLATELET
BASOS ABS: 0.1 10*3/uL (ref 0.0–0.1)
BASOS PCT: 0.9 % (ref 0.0–3.0)
EOS ABS: 0.2 10*3/uL (ref 0.0–0.7)
Eosinophils Relative: 3.1 % (ref 0.0–5.0)
HCT: 37.7 % (ref 36.0–46.0)
Hemoglobin: 12.3 g/dL (ref 12.0–15.0)
LYMPHS PCT: 25.8 % (ref 12.0–46.0)
Lymphs Abs: 1.6 10*3/uL (ref 0.7–4.0)
MCHC: 32.7 g/dL (ref 30.0–36.0)
MCV: 88.8 fl (ref 78.0–100.0)
MONO ABS: 0.6 10*3/uL (ref 0.1–1.0)
Monocytes Relative: 9.8 % (ref 3.0–12.0)
Neutro Abs: 3.9 10*3/uL (ref 1.4–7.7)
Neutrophils Relative %: 60.4 % (ref 43.0–77.0)
Platelets: 305 10*3/uL (ref 150.0–400.0)
RBC: 4.24 Mil/uL (ref 3.87–5.11)
RDW: 15.3 % — AB (ref 11.5–14.6)
WBC: 6.4 10*3/uL (ref 4.5–10.5)

## 2013-06-23 LAB — SEDIMENTATION RATE: Sed Rate: 34 mm/hr — ABNORMAL HIGH (ref 0–22)

## 2013-06-23 NOTE — Patient Instructions (Addendum)
We will call you regarding ultrasound appointment. Followup immediately for any new symptoms such as vomiting, abdominal pain, visual changes, skin rash, or any progressive fever or worsening symptoms

## 2013-06-23 NOTE — Progress Notes (Signed)
Pre visit review using our clinic review tool, if applicable. No additional management support is needed unless otherwise documented below in the visit note. 

## 2013-06-23 NOTE — Telephone Encounter (Signed)
Make sure this patient has follow up with Dr Sherren Mocha early this week.

## 2013-06-23 NOTE — Progress Notes (Signed)
Subjective:    Patient ID: Shelley Coleman, female    DOB: 1940/06/14, 73 y.o.   MRN: [id number]  Sinusitis Associated symptoms include chills and headaches. Pertinent negatives include no coughing or shortness of breath.   Persistent fever. Almost 10 days duration. She has very nonspecific symptoms. She's had intermittent body aches and chills. Her aches are diffuse and not confined to upper trunck. She was seen recently and had nonspecific test results with set rate of 69, mildly elevated liver transaminases, normal white blood count, negative urine culture. Patient continues to deny any cough, shortness of breath, abdominal pain, diarrhea, sinus congestive symptoms. Has some intermittent diffuse headaches. No visual changes. No sinusitis symptoms. No sore throat. No skin rashes. No specific arthralgias. No recent travels. She is complaining of some low-grade nausea which is a new symptom from last visit.  Past Medical History  Diagnosis Date  . Depression   . GERD (gastroesophageal reflux disease)   . Hyperlipidemia   . Breast cancer   . Ovarian cyst   . Parotid tumor     right  . Shingles 2000    Left in V1  . Hx of mitral valve prolapse   . Irregular heart beat   . Restless legs syndrome   . Osteopenia   . Optic neuritis     left  . Pelvic fracture   . History of GI bleed   . Hiatal hernia   . Reflux esophagitis    Past Surgical History  Procedure Laterality Date  . Appendectomy    . Abdominal hysterectomy    . Spine surgery    . Mastectomy Right   . Breast lumpectomy Left   . Total hip arthroplasty Left   . Hernia repair  08/2009  . Esophageal manometry N/A 01/13/2013    Procedure: ESOPHAGEAL MANOMETRY (EM);  Surgeon: Sable Feil, MD;  Location: [facility];  Service: Endoscopy;  Laterality: N/A;    reports that she has never smoked. She has never used smokeless tobacco. She reports that she drinks alcohol. She reports that she does not use illicit drugs. family  history includes Cancer in her mother; Heart disease in her father; Hernia in her brother; Hypertension in her brother and father; Stroke in her father. Allergies  Allergen Reactions  . Diflunisal     REACTION: rash , swelling  anaphlaxsis  . Tetanus Toxoid     REACTION: arm swelling, fever      Review of Systems  Constitutional: Positive for fever, chills and fatigue. Negative for appetite change.  Respiratory: Negative for cough, shortness of breath and wheezing.   Cardiovascular: Negative for chest pain, palpitations and leg swelling.  Gastrointestinal: Positive for nausea. Negative for vomiting, abdominal pain, diarrhea and constipation.  Skin: Negative for rash.  Neurological: Positive for headaches.  Hematological: Negative for adenopathy.       Objective:   Physical Exam  Constitutional: She appears well-developed and well-nourished.  HENT:  Mouth/Throat: Oropharynx is clear and moist.  Neck: Neck supple.  Cardiovascular: Normal rate and regular rhythm.   Pulmonary/Chest: Effort normal and breath sounds normal. No respiratory distress. She has no wheezes. She has no rales.  Abdominal: Soft. Bowel sounds are normal. She exhibits no distension and no mass. There is no tenderness. There is no rebound and no guarding.  Lymphadenopathy:    She has no cervical adenopathy.  Skin: No rash noted.          Assessment & Plan:  Prolonged fever.  Patient appears nontoxic. We've considered possible infectious and noninfectious etiologies.  Considered polymyalgia but she does not have a distribution of aches that are consistent with this. She has intermittent chills and intermittent fever but no localizing findings. Repeat hepatic function, CBC, sedimentation rate. Check hepatitis A antibodies though doubtful. Check CMV antibodies. Obtain abdominal and pelvic ultrasound given liver transaminase elevations of uncertain etiology- even though these were only mildly elevated.

## 2013-06-23 NOTE — Telephone Encounter (Signed)
Pt has appt to see Dr. Elease Hashimoto today 06/23/13

## 2013-06-23 NOTE — Telephone Encounter (Signed)
[facility] Triage Call Report Triage Record Num: [id number] Operator: Annette Stable Patient Name: Shelley Coleman Call Date & Time: 06/21/2013 5:11:16PM Patient Phone: [phone] PCP: Jory Ee. Todd Patient Gender: Female PCP Fax : [phone] Patient DOB: 05/21/1940 Practice Name: [facility] Reason for Call: Caller: Jennfier/Patient; PCP: Stevie K[facility]); CB#: [phone]; call regarding fever, headache, mild nausea, mild weakness onset 2/13. Tmax 102.8 oral with temp of 101 oral today. Was seen in office on 2/18 and had labwork showing elevated sed rate and elevated liver enzymes. Pt states Dr. Elease Hashimoto called her on 2/19 in afternoon to relay those results and stated she would likely need a liver ultrasound and that he would discuss with her PCP Dr. Sherren Mocha. She states she never heard anything on 2/20 and is very concerned that she is still having same sxs without any improvement. She is also requesting results of her urine culture. Triage RN reviewed results in EMR and informed her urine culture was negative. Triaged per "Headache" protocol, all emergent sxs r/o w/exception of "Any temp elevation in an immunocompromised individual or frail elderly." Spoke with oncall MD, Dr. Anitra Lauth, who advised to continue to treat fever and headache with ibuprofen as needed, push fluids, and to f/u first thing next week in office. Also advised if develops any new or worsening sxs prior to appt to call back and may need to go to ER in that case. Pt agreed to plan and appt was made for Monday 2/23 at 9:30am. Protocol(s) Used: Headache Recommended Outcome per Protocol: Call Provider Immediately Reason for Outcome: Any temperature elevation in an immunocompromised individual OR frail elderly Care Advice: ~ Go to the ED if has new onset of stiff neck, vomiting, drowsiness or confusion. ~ SYMPTOM / CONDITION MANAGEMENT ~ IMMEDIATE ACTION May have clear liquids (such as water,  clear fruit juices without pulp, soda, tea or coffee without dairy or non-dairy creamer, clear broth or bouillon, oral hydration solution, or plain gelatin, fruit ices/popsicles, hard candy) but do not eat solid foods before talking with your provider. ~ Analgesic/Antipyretic Advice - Acetaminophen: Consider acetaminophen as directed on label or by pharmacist/provider for pain or fever PRECAUTIONS: - Use if there is no history of liver disease, alcoholism, or intake of three or more alcohol drinks per day - Only if approved by provider during pregnancy or when breastfeeding - During pregnancy, acetaminophen should not be taken more than 3 consecutive days without telling provider - Do not exceed recommended dose or frequency ~ Analgesic/Antipyretic Advice - NSAIDs: Consider aspirin, ibuprofen, naproxen or ketoprofen for pain or fever as directed on label or by pharmacist/provider. PRECAUTIONS: - If over 70 years of age, should not take longer than 1 week without consulting provider. EXCEPTIONS: - Should not be used if taking blood thinners or have bleeding problems. - Do not use if have history of sensitivity/allergy to any of these medications; or history of cardiovascular, ulcer, kidney, liver disease or diabetes unless approved by provider. - Do not exceed recommended dose or frequency. ~ 06/21/2013 6:26:17PM Page 1 of 2 [id number] [facility] Triage Call Report Patient Name: Shelley Coleman continuation page/s - Do not exceed recommended dose or frequency. 02/

## 2013-06-24 LAB — HEPATITIS A ANTIBODY, IGM: HEP A IGM: NONREACTIVE

## 2013-06-24 LAB — CMV IGM

## 2013-07-02 ENCOUNTER — Ambulatory Visit
Admission: RE | Admit: 2013-07-02 | Discharge: 2013-07-02 | Disposition: A | Discharge status: Home / Self Care | Payer: Medicare Other | Source: Ambulatory Visit | Attending: Family Medicine | Admitting: Family Medicine

## 2013-07-02 DIAGNOSIS — R509 Fever, unspecified: Secondary | ICD-10-CM

## 2013-07-02 DIAGNOSIS — R740 Nonspecific elevation of levels of transaminase and lactic acid dehydrogenase [LDH]: Secondary | ICD-10-CM

## 2013-07-02 DIAGNOSIS — R7401 Elevation of levels of liver transaminase levels: Secondary | ICD-10-CM

## 2013-07-02 DIAGNOSIS — N281 Cyst of kidney, acquired: Secondary | ICD-10-CM | POA: Diagnosis not present

## 2013-07-02 DIAGNOSIS — R110 Nausea: Secondary | ICD-10-CM

## 2013-07-03 ENCOUNTER — Telehealth: Payer: Self-pay | Admitting: Family Medicine

## 2013-07-03 NOTE — Telephone Encounter (Signed)
Patient is aware of ultra sound result

## 2013-07-03 NOTE — Telephone Encounter (Signed)
Pt is calling to get results from ultra sound

## 2013-07-09 DIAGNOSIS — D0590 Unspecified type of carcinoma in situ of unspecified breast: Secondary | ICD-10-CM | POA: Diagnosis not present

## 2013-07-09 DIAGNOSIS — R5383 Other fatigue: Secondary | ICD-10-CM | POA: Diagnosis not present

## 2013-07-09 DIAGNOSIS — R5381 Other malaise: Secondary | ICD-10-CM | POA: Diagnosis not present

## 2013-10-02 ENCOUNTER — Other Ambulatory Visit: Payer: Self-pay | Admitting: Gastroenterology

## 2013-10-06 ENCOUNTER — Telehealth: Payer: Self-pay | Admitting: Family Medicine

## 2013-10-06 MED ORDER — DULOXETINE HCL 30 MG PO CPEP: ORAL_CAPSULE | ORAL | Status: DC

## 2013-10-06 NOTE — Telephone Encounter (Signed)
[facility] [place], [place] - [address] Is requesting re-fill on  DULoxetine (CYMBALTA) 30 MG capsule

## 2013-10-07 ENCOUNTER — Other Ambulatory Visit: Payer: Self-pay | Admitting: *Deleted

## 2013-10-07 MED ORDER — DEXLANSOPRAZOLE 60 MG PO CPDR: 60.0000 mg | DELAYED_RELEASE_CAPSULE | Freq: Every day | ORAL | Status: DC

## 2013-10-08 ENCOUNTER — Ambulatory Visit: Payer: Medicare Other | Admitting: Neurology

## 2013-10-08 ENCOUNTER — Telehealth: Payer: Self-pay | Admitting: Neurology

## 2013-10-08 NOTE — Telephone Encounter (Signed)
This patient canceled her appointment within 3 hours of the visit.

## 2013-10-10 ENCOUNTER — Encounter: Payer: Self-pay | Admitting: Neurology

## 2013-10-10 ENCOUNTER — Ambulatory Visit (INDEPENDENT_AMBULATORY_CARE_PROVIDER_SITE_OTHER): Payer: Medicare Other | Admitting: Neurology

## 2013-10-10 VITALS — BP 120/71 | HR 86 | Wt 181.0 lb

## 2013-10-10 DIAGNOSIS — G2581 Restless legs syndrome: Secondary | ICD-10-CM | POA: Diagnosis not present

## 2013-10-10 DIAGNOSIS — D509 Iron deficiency anemia, unspecified: Secondary | ICD-10-CM | POA: Diagnosis not present

## 2013-10-10 DIAGNOSIS — G252 Other specified forms of tremor: Secondary | ICD-10-CM

## 2013-10-10 DIAGNOSIS — M4120 Other idiopathic scoliosis, site unspecified: Secondary | ICD-10-CM

## 2013-10-10 DIAGNOSIS — G250 Essential tremor: Secondary | ICD-10-CM

## 2013-10-10 MED ORDER — HYDROCODONE-ACETAMINOPHEN 7.5-325 MG PO TABS: 1.0000 | ORAL_TABLET | Freq: Four times a day (QID) | ORAL | Status: DC | PRN

## 2013-10-10 MED ORDER — ROPINIROLE HCL 3 MG PO TABS: 3.0000 mg | ORAL_TABLET | Freq: Every day | ORAL | Status: DC

## 2013-10-10 NOTE — Progress Notes (Signed)
Reason for visit: Restless leg syndrome  Shelley Coleman is an 73 y.o. female  History of present illness:  Shelley Coleman is a 73 year old right-handed white female with a history of restless leg syndrome. The patient has had prior lumbosacral spine surgery 2 years ago, and she does have some low back discomfort. The patient has required IV iron in December 2014 as her oral supplementation did not elevate her iron and ferritin levels significantly. After the IV iron, she gained significant benefit with her symptoms for 5-6 months. Within the last month, she has had increasing fatigue of the legs, and worsening of the restless leg syndrome symptoms. She is on Requip taking 3 mg in the evenings. The patient has been under increased stress as her husband has been diagnosed with a mesothelioma. She returns for an evaluation.  Past Medical History  Diagnosis Date  . Depression   . GERD (gastroesophageal reflux disease)   . Hyperlipidemia   . Breast cancer   . Ovarian cyst   . Parotid tumor     right  . Shingles 2000    Left in V1  . Hx of mitral valve prolapse   . Irregular heart beat   . Restless legs syndrome   . Osteopenia   . Optic neuritis     left  . Pelvic fracture   . History of GI bleed   . Hiatal hernia   . Reflux esophagitis     Past Surgical History  Procedure Laterality Date  . Appendectomy    . Abdominal hysterectomy    . Spine surgery    . Mastectomy Right   . Breast lumpectomy Left   . Total hip arthroplasty Left   . Hernia repair  08/2009  . Esophageal manometry N/A 01/13/2013    Procedure: ESOPHAGEAL MANOMETRY (EM);  Surgeon: Sable Feil, MD;  Location: [facility];  Service: Endoscopy;  Laterality: N/A;    Family History  Problem Relation Age of Onset  . Cancer Mother     sarcoma  . Heart disease Father   . Hypertension Father   . Stroke Father   . Hernia Brother   . Hypertension Brother     Social history:  reports that she has never smoked.  She has never used smokeless tobacco. She reports that she drinks alcohol. She reports that she does not use illicit drugs.    Allergies  Allergen Reactions  . Diflunisal     REACTION: rash , swelling  anaphlaxsis  . Tetanus Toxoid     REACTION: arm swelling, fever    Medications:  Current Outpatient Prescriptions on File Prior to Visit  Medication Sig Dispense Refill  . atenolol (TENORMIN) 50 MG tablet Take 50 mg by mouth daily. As needed      . dexlansoprazole (DEXILANT) 60 MG capsule Take 1 capsule (60 mg total) by mouth daily.  90 capsule  2  . DULoxetine (CYMBALTA) 30 MG capsule One tablet daily for 2 weeks, then take one tablet twice a day  60 capsule  3  . ferrous sulfate 325 (65 FE) MG tablet Take 325 mg by mouth daily with breakfast.      . hydrochlorothiazide (HYDRODIURIL) 25 MG tablet Take 25 mg by mouth daily. As needed      . Ibuprofen (ADVIL PO) Take 200 mg by mouth every 4 (four) hours as needed. Take 100mg  every 6 hours as needed      . Multiple Vitamin (MULTIVITAMIN) tablet Take 1  tablet by mouth daily.      . nitroGLYCERIN (NITROSTAT) 0.4 MG SL tablet Place 1 tablet (0.4 mg total) under the tongue every 5 (five) minutes as needed.  90 tablet  0  . sucralfate (CARAFATE) 1 GM/10ML suspension Take 10 mL (1 g total) by mouth 4 (four) times daily as needed.  420 mL  0  . vitamin C (ASCORBIC ACID) 500 MG tablet Take 500 mg by mouth daily.      . vitamin E 200 UNIT capsule Take 200 Units by mouth daily.       No current facility-administered medications on file prior to visit.    ROS:  Out of a complete 14 system review of symptoms, the patient complains only of the following symptoms, and all other reviewed systems are negative.  Fatigue Heart murmur Restless legs, frequent waking Muscle cramps, walking difficulty Anemia, numbness Depression  Blood pressure 120/71, pulse 86, weight 181 lb (82.101 kg).  Physical Exam  General: The patient is alert and  cooperative at the time of the examination.  Skin: No significant peripheral edema is noted.   Neurologic Exam  Mental status: The patient is oriented x 3.  Cranial nerves: Facial symmetry is present. Speech is normal, no aphasia or dysarthria is noted. Extraocular movements are full. Visual fields are full.  Motor: The patient has good strength in all 4 extremities.  Sensory examination: Soft touch sensation is decreased on the right leg as compared to left, symmetric in arms and on the face.  Coordination: The patient has good finger-nose-finger and heel-to-shin bilaterally.  Gait and station: The patient has a normal gait. Tandem gait is normal. Romberg is negative. No drift is seen.  Reflexes: Deep tendon reflexes are symmetric.   Assessment/Plan:  1. Restless leg syndrome  2. Lumbosacral spine disease  The patient will be sent for iron and ferritin levels again. If this is low, IV dextran may be used again. The patient seemed to get good benefit with this previously. She will followup in 6 months. The patient was given a prescription for the hydrocodone and the Requip.  Jill Alexanders MD 10/10/2013 1:42 PM  [facility] [address] [place], [place] [address]  Phone [phone] Fax [phone]

## 2013-10-10 NOTE — Patient Instructions (Signed)
Restless Legs Syndrome Restless legs syndrome is a movement disorder. It may also be called a sensori-motor disorder.  CAUSES  No one knows what specifically causes restless legs syndrome, but it tends to run in families. It is also more common in people with low iron, in pregnancy, in people who need dialysis, and those with nerve damage (neuropathy).Some medications may make restless legs syndrome worse.Those medications include drugs to treat high blood pressure, some heart conditions, nausea, colds, allergies, and depression. SYMPTOMS Symptoms include uncomfortable sensations in the legs. These leg sensations are worse during periods of inactivity or rest. They are also worse while sitting or lying down. Individuals that have the disorder describe sensations in the legs that feel like:  Pulling.  Drawing.  Crawling.  Worming.  Boring.  Tingling.  Pins and needles.  Prickling.  Pain. The sensations are usually accompanied by an overwhelming urge to move the legs. Sudden muscle jerks may also occur. Movement provides temporary relief from the discomfort. In rare cases, the arms may also be affected. Symptoms may interfere with going to sleep (sleep onset insomnia). Restless legs syndrome may also be related to periodic limb movement disorder (PLMD). PLMD is another more common motor disorder. It also causes interrupted sleep. The symptoms from PLMD usually occur most often when you are awake. TREATMENT  Treatment for restless legs syndrome is symptomatic. This means that the symptoms are treated.   Massage and cold compresses may provide temporary relief.  Walk, stretch, or take a cold or hot bath.  Get regular exercise and a good night's sleep.  Avoid caffeine, alcohol, nicotine, and medications that can make it worse.  Do activities that provide mental stimulation like discussions, needlework, and video games. These may be helpful if you are not able to walk or  stretch. Some medications are effective in relieving the symptoms. However, many of these medications have side effects. Ask your caregiver about medications that may help your symptoms. Correcting iron deficiency may improve symptoms for some patients. Document Released: 04/07/2002 Document Revised: 07/10/2011 Document Reviewed: 07/14/2010 Exit[employer] Patient Information 2014 [employer].  

## 2013-10-11 LAB — IRON AND TIBC
Iron Saturation: 24 % (ref 15–55)
Iron: 80 ug/dL (ref 35–155)
TIBC: 338 ug/dL (ref 250–450)
UIBC: 258 ug/dL (ref 150–375)

## 2013-10-11 LAB — FERRITIN: FERRITIN: 33 ng/mL (ref 15–150)

## 2013-10-12 ENCOUNTER — Telehealth: Payer: Self-pay | Admitting: Neurology

## 2013-10-12 ENCOUNTER — Other Ambulatory Visit: Payer: Self-pay | Admitting: Neurology

## 2013-10-12 DIAGNOSIS — G2581 Restless legs syndrome: Secondary | ICD-10-CM

## 2013-10-12 DIAGNOSIS — D509 Iron deficiency anemia, unspecified: Secondary | ICD-10-CM

## 2013-10-12 NOTE — Telephone Encounter (Signed)
I called the patient. The Iron and ferritin levels are OK. The ferritin is in the low normal range. I will get a course of IV dextran set up.

## 2013-10-15 ENCOUNTER — Telehealth: Payer: Self-pay | Admitting: Neurology

## 2013-10-15 NOTE — Telephone Encounter (Signed)
Patient calling regarding appointment for IV dextran.  Please call and advise.

## 2013-10-16 NOTE — Telephone Encounter (Signed)
I called pt and relayed that appt 10-21-13 at 0700, be there 0645.  Infusion should be 4.5 - 5 hours.  She is to call and confirm [phone] with [facility] and change appt if needed.  I call admitting, spoke to [facility] and no prior authorization needed for medicare/aarp.  [id number].

## 2013-10-21 ENCOUNTER — Encounter (HOSPITAL_COMMUNITY)
Admission: RE | Admit: 2013-10-21 | Discharge: 2013-10-21 | Disposition: A | Discharge status: Home / Self Care | Payer: Medicare Other | Source: Ambulatory Visit | Attending: Neurology | Admitting: Neurology

## 2013-10-21 ENCOUNTER — Other Ambulatory Visit (HOSPITAL_COMMUNITY): Payer: Self-pay | Admitting: Neurology

## 2013-10-21 ENCOUNTER — Encounter (HOSPITAL_COMMUNITY): Payer: Self-pay

## 2013-10-21 VITALS — BP 134/82 | HR 73 | Temp 97.0°F | Resp 18 | Ht 68.0 in

## 2013-10-21 DIAGNOSIS — G2581 Restless legs syndrome: Secondary | ICD-10-CM | POA: Diagnosis not present

## 2013-10-21 DIAGNOSIS — D509 Iron deficiency anemia, unspecified: Secondary | ICD-10-CM | POA: Diagnosis not present

## 2013-10-21 MED ORDER — SODIUM CHLORIDE 0.9 % IV SOLN
Freq: Once | INTRAVENOUS | Status: AC
  Administered 2013-10-21: 07:00:00 via INTRAVENOUS

## 2013-10-21 MED ORDER — SODIUM CHLORIDE 0.9 % IV SOLN
1000.0000 mg | Freq: Once | INTRAVENOUS | Status: AC
  Administered 2013-10-21: 1000 mg via INTRAVENOUS
  Filled 2013-10-21: qty 20

## 2013-10-21 NOTE — Discharge Instructions (Signed)

## 2014-01-28 ENCOUNTER — Encounter: Payer: Self-pay | Admitting: Internal Medicine

## 2014-01-28 ENCOUNTER — Ambulatory Visit (INDEPENDENT_AMBULATORY_CARE_PROVIDER_SITE_OTHER): Payer: Medicare Other | Admitting: Internal Medicine

## 2014-01-28 VITALS — BP 130/90 | HR 88 | Temp 98.3°F | Resp 20 | Ht 68.0 in | Wt 176.0 lb

## 2014-01-28 DIAGNOSIS — N3000 Acute cystitis without hematuria: Secondary | ICD-10-CM | POA: Diagnosis not present

## 2014-01-28 DIAGNOSIS — R300 Dysuria: Secondary | ICD-10-CM | POA: Diagnosis not present

## 2014-01-28 DIAGNOSIS — G609 Hereditary and idiopathic neuropathy, unspecified: Secondary | ICD-10-CM | POA: Diagnosis not present

## 2014-01-28 LAB — POCT URINALYSIS DIPSTICK
Bilirubin, UA: NEGATIVE
Glucose, UA: NEGATIVE
Ketones, UA: NEGATIVE
NITRITE UA: POSITIVE
PH UA: 5.5
PROTEIN UA: NEGATIVE
Spec Grav, UA: 1.01
UROBILINOGEN UA: 0.2

## 2014-01-28 MED ORDER — CIPROFLOXACIN HCL 500 MG PO TABS: 500.0000 mg | ORAL_TABLET | Freq: Two times a day (BID) | ORAL | Status: AC

## 2014-01-28 NOTE — Progress Notes (Signed)
Subjective:    Patient ID: Shelley Coleman, female    DOB: [birth year], 73 y.o.   MRN: [id number]  HPI  73 year old patient who is seen today with a 2 to three-day history of burning, dysuria and frequency.  She has had UTIs in the past and was last treated in May of this year. She has been under considerable situational stress due to the poor health.  Her husband, who is undergoing chemotherapy for mesothelioma.  Presently, he is in the ED setting receiving IV fluids. She has a history depression, and did quite well on citalopram in the past.  Due to peripheral neuropathy.  This was switched to Cymbalta.  She felt this had very little effect and has self discontinued.  She is also followed by neurology  Past Medical History  Diagnosis Date  . Depression   . GERD (gastroesophageal reflux disease)   . Hyperlipidemia   . Breast cancer   . Ovarian cyst   . Parotid tumor     right  . Shingles 2000    Left in V1  . Hx of mitral valve prolapse   . Irregular heart beat   . Restless legs syndrome   . Osteopenia   . Optic neuritis     left  . Pelvic fracture   . History of GI bleed   . Hiatal hernia   . Reflux esophagitis     History   Social History  . Marital Status: Married    Spouse Name: N/A    Number of Children: 2  . Years of Education: 16   Occupational History  . Retired Therapist, sports    Social History Main Topics  . Smoking status: Never Smoker   . Smokeless tobacco: Never Used  . Alcohol Use: Yes     Comment: Rarely,socially  . Drug Use: No  . Sexual Activity: Not on file   Other Topics Concern  . Not on file   Social History Narrative  . No narrative on file    Past Surgical History  Procedure Laterality Date  . Appendectomy    . Abdominal hysterectomy    . Spine surgery    . Mastectomy Right   . Breast lumpectomy Left   . Total hip arthroplasty Left   . Hernia repair  08/2009  . Esophageal manometry N/A 01/13/2013    Procedure: ESOPHAGEAL MANOMETRY (EM);   Surgeon: Sable Feil, MD;  Location: [facility];  Service: Endoscopy;  Laterality: N/A;    Family History  Problem Relation Age of Onset  . Cancer Mother     sarcoma  . Heart disease Father   . Hypertension Father   . Stroke Father   . Hernia Brother   . Hypertension Brother     Allergies  Allergen Reactions  . Diflunisal     REACTION: rash , swelling  anaphlaxsis  . Tetanus Toxoid     REACTION: arm swelling, fever    Current Outpatient Prescriptions on File Prior to Visit  Medication Sig Dispense Refill  . atenolol (TENORMIN) 50 MG tablet Take 50 mg by mouth daily. As needed      . dexlansoprazole (DEXILANT) 60 MG capsule Take 1 capsule (60 mg total) by mouth daily.  90 capsule  2  . hydrochlorothiazide (HYDRODIURIL) 25 MG tablet Take 25 mg by mouth daily. As needed      . HYDROcodone-acetaminophen (NORCO) 7.5-325 MG per tablet Take 1 tablet by mouth every 6 (six) hours as needed.  Ingram  tablet  0  . Ibuprofen (ADVIL PO) Take 200 mg by mouth every 4 (four) hours as needed. Take 100mg  every 6 hours as needed      . Multiple Vitamin (MULTIVITAMIN) tablet Take 1 tablet by mouth daily.      . nitroGLYCERIN (NITROSTAT) 0.4 MG SL tablet Place 1 tablet (0.4 mg total) under the tongue every 5 (five) minutes as needed.  90 tablet  0  . rOPINIRole (REQUIP) 3 MG tablet Take 1 tablet (3 mg total) by mouth at bedtime.  90 tablet  1  . vitamin C (ASCORBIC ACID) 500 MG tablet Take 500 mg by mouth daily.      . vitamin E 200 UNIT capsule Take 200 Units by mouth daily.       No current facility-administered medications on file prior to visit.    BP 130/90  Pulse 88  Temp(Src) 98.3 F (36.8 C) (Oral)  Resp 20  Ht 5\' 8"  (1.727 m)  Wt 176 lb (79.833 kg)  BMI 26.77 kg/m2  SpO2 98%    Review of Systems  Constitutional: Negative.   HENT: Negative for congestion, dental problem, hearing loss, rhinorrhea, sinus pressure, sore throat and tinnitus.   Eyes: Negative for pain,  discharge and visual disturbance.  Respiratory: Negative for cough and shortness of breath.   Cardiovascular: Negative for chest pain, palpitations and leg swelling.  Gastrointestinal: Negative for nausea, vomiting, abdominal pain, diarrhea, constipation, blood in stool and abdominal distention.  Genitourinary: Positive for dysuria and frequency. Negative for urgency, hematuria, flank pain, vaginal bleeding, vaginal discharge, difficulty urinating, vaginal pain and pelvic pain.  Musculoskeletal: Negative for arthralgias, gait problem and joint swelling.  Skin: Negative for rash.  Neurological: Negative for dizziness, syncope, speech difficulty, weakness, numbness and headaches.  Hematological: Negative for adenopathy.  Psychiatric/Behavioral: Positive for dysphoric mood and decreased concentration. Negative for behavioral problems and agitation. The patient is not nervous/anxious.        Objective:   Physical Exam  Constitutional: She is oriented to person, place, and time. She appears well-developed and well-nourished.  HENT:  Head: Normocephalic.  Right Ear: External ear normal.  Left Ear: External ear normal.  Mouth/Throat: Oropharynx is clear and moist.  Eyes: Conjunctivae and EOM are normal. Pupils are equal, round, and reactive to light.  Neck: Normal range of motion. Neck supple. No thyromegaly present.  Cardiovascular: Normal rate, regular rhythm, normal heart sounds and intact distal pulses.   Pulmonary/Chest: Effort normal and breath sounds normal. No respiratory distress. She has no wheezes. She has no rales.  Abdominal: Soft. Bowel sounds are normal. She exhibits no mass. There is no tenderness. There is no rebound and no guarding.  Musculoskeletal: Normal range of motion.  Lymphadenopathy:    She has no cervical adenopathy.  Neurological: She is alert and oriented to person, place, and time.  Skin: Skin is warm and dry. No rash noted.  Psychiatric: She has a normal mood  and affect. Her behavior is normal.          Assessment & Plan:   UTI.  Will retreat with Cipro for 7 days Depression.  The patient will consider resuming Cymbalta  Followup PCP 4 weeks or as needed

## 2014-01-28 NOTE — Patient Instructions (Signed)
Drink as much fluid as you  can tolerate over the next few days  Take your antibiotic as prescribed until ALL of it is gone, but stop if you develop a rash, swelling, or any side effects of the medication.  Contact our office as soon as possible if  there are side effects of the medication.  Call or return to clinic prn if these symptoms worsen or fail to improve as anticipated.   

## 2014-01-28 NOTE — Progress Notes (Signed)
Pre visit review using our clinic review tool, if applicable. No additional management support is needed unless otherwise documented below in the visit note. 

## 2014-04-06 DIAGNOSIS — Z23 Encounter for immunization: Secondary | ICD-10-CM | POA: Diagnosis not present

## 2014-04-13 ENCOUNTER — Encounter: Payer: Self-pay | Admitting: Adult Health

## 2014-04-13 ENCOUNTER — Ambulatory Visit (INDEPENDENT_AMBULATORY_CARE_PROVIDER_SITE_OTHER): Payer: Medicare Other | Admitting: Adult Health

## 2014-04-13 VITALS — BP 120/69 | HR 74 | Temp 98.2°F | Ht 68.0 in | Wt 180.0 lb

## 2014-04-13 DIAGNOSIS — E611 Iron deficiency: Secondary | ICD-10-CM | POA: Diagnosis not present

## 2014-04-13 DIAGNOSIS — G250 Essential tremor: Secondary | ICD-10-CM

## 2014-04-13 DIAGNOSIS — G2581 Restless legs syndrome: Secondary | ICD-10-CM | POA: Diagnosis not present

## 2014-04-13 DIAGNOSIS — R5383 Other fatigue: Secondary | ICD-10-CM

## 2014-04-13 DIAGNOSIS — Z8639 Personal history of other endocrine, nutritional and metabolic disease: Secondary | ICD-10-CM | POA: Diagnosis not present

## 2014-04-13 MED ORDER — HYDROCODONE-ACETAMINOPHEN 7.5-325 MG PO TABS: 1.0000 | ORAL_TABLET | Freq: Four times a day (QID) | ORAL | Status: DC | PRN

## 2014-04-13 NOTE — Progress Notes (Signed)
  PATIENT: Shelley Coleman DOB: 09/12/1940  REASON FOR VISIT: follow up HISTORY FROM: patient  HISTORY OF PRESENT ILLNESS: Shelley Coleman is a 73 year old female with a history of restless leg syndrome and low ferritin and iron. She returns today for follow-up. She is currently taking requip 3 mg at bedtime and cymbalta 60 mg daily. She reports that she continues to have problems with RLS. Her symptoms have gotten worse within the last month. She states that her last iron infusion was in June and has not had blood work since. She states these symptoms are similar to what she has had prior to the iron infusion. She states that after an infusion her symptoms greatly improved. She will still have some symptoms but it is a lot better. She reports that her fatigue has gotten worse within the last month. Again this is similar to her symptoms pre-infusion. She states that she gives out easily after walking for a period of time. She uses hydrocodone for severe pain at night. Her last refill was in June 2015. She is under a lot of stress. Her husband was diagnosed with mesothelioma and has completed chemotherapy. His prognosis is not good.   HISTORY 10/10/13 (WILLIS): 73-year-old right-handed white female with a history of restless leg syndrome. The patient has had prior lumbosacral spine surgery 2 years ago, and she does have some low back discomfort. The patient has required IV iron in December 2014 as her oral supplementation did not elevate her iron and ferritin levels significantly. After the IV iron, she gained significant benefit with her symptoms for 5-6 months. Within the last month, she has had increasing fatigue of the legs, and worsening of the restless leg syndrome symptoms. She is on Requip taking 3 mg in the evenings. The patient has been under increased stress as her husband has been diagnosed with a mesothelioma. She returns for an evaluation.  REVIEW OF SYSTEMS: Out of a complete 14 system review of  symptoms, the patient complains only of the following symptoms, and all other reviewed systems are negative.  Fatigue Ringing in ears   anemia  ALLERGIES: Allergies  Allergen Reactions  . Diflunisal     REACTION: rash , swelling  anaphlaxsis  . Tetanus Toxoid     REACTION: arm swelling, fever    HOME MEDICATIONS: Outpatient Prescriptions Prior to Visit  Medication Sig Dispense Refill  . atenolol (TENORMIN) 50 MG tablet Take 50 mg by mouth daily. As needed    . dexlansoprazole (DEXILANT) 60 MG capsule Take 1 capsule (60 mg total) by mouth daily. 90 capsule 2  . hydrochlorothiazide (HYDRODIURIL) 25 MG tablet Take 25 mg by mouth daily. As needed    . HYDROcodone-acetaminophen (NORCO) 7.5-325 MG per tablet Take 1 tablet by mouth every 6 (six) hours as needed. 120 tablet 0  . Ibuprofen (ADVIL PO) Take 200 mg by mouth every 4 (four) hours as needed. Take 100mg every 6 hours as needed    . Multiple Vitamin (MULTIVITAMIN) tablet Take 1 tablet by mouth daily.    . nitroGLYCERIN (NITROSTAT) 0.4 MG SL tablet Place 1 tablet (0.4 mg total) under the tongue every 5 (five) minutes as needed. 90 tablet 0  . rOPINIRole (REQUIP) 3 MG tablet Take 1 tablet (3 mg total) by mouth at bedtime. 90 tablet 1  . vitamin C (ASCORBIC ACID) 500 MG tablet Take 500 mg by mouth daily.    . vitamin E 200 UNIT capsule Take 200 Units by mouth   daily.     No facility-administered medications prior to visit.    PAST MEDICAL HISTORY: Past Medical History  Diagnosis Date  . Depression   . GERD (gastroesophageal reflux disease)   . Hyperlipidemia   . Breast cancer   . Ovarian cyst   . Parotid tumor     right  . Shingles 2000    Left in V1  . Hx of mitral valve prolapse   . Irregular heart beat   . Restless legs syndrome   . Osteopenia   . Optic neuritis     left  . Pelvic fracture   . History of GI bleed   . Hiatal hernia   . Reflux esophagitis     PAST SURGICAL HISTORY: Past Surgical History    Procedure Laterality Date  . Appendectomy    . Abdominal hysterectomy    . Spine surgery    . Mastectomy Right   . Breast lumpectomy Left   . Total hip arthroplasty Left   . Hernia repair  08/2009  . Esophageal manometry N/A 01/13/2013    Procedure: ESOPHAGEAL MANOMETRY (EM);  Surgeon: David R Patterson, MD;  Location: [facility];  Service: Endoscopy;  Laterality: N/A;    FAMILY HISTORY: Family History  Problem Relation Age of Onset  . Cancer Mother     sarcoma  . Heart disease Father   . Hypertension Father   . Stroke Father   . Hernia Brother   . Hypertension Brother     SOCIAL HISTORY: History   Social History  . Marital Status: Married    Spouse Name: N/A    Number of Children: 2  . Years of Education: 16   Occupational History  . Retired RN    Social History Main Topics  . Smoking status: Never Smoker   . Smokeless tobacco: Never Used  . Alcohol Use: Yes     Comment: Rarely,socially  . Drug Use: No  . Sexual Activity: Not on file   Other Topics Concern  . Not on file   Social History Narrative      PHYSICAL EXAM  Filed Vitals:   04/13/14 1028  BP: 120/69  Pulse: 74  Temp: 98.2 F (36.8 C)  TempSrc: Oral  Height: 5' 8" (1.727 m)  Weight: 180 lb (81.647 kg)   Body mass index is 27.38 kg/(m^2).  Generalized: Well developed, in no acute distress   Neurological examination  Mentation: Alert oriented to time, place, history taking. Follows all commands speech and language fluent Cranial nerve II-XII: Pupils were equal round reactive to light. Extraocular movements were full, visual field were full on confrontational test. Facial sensation and strength were normal. Uvula tongue midline. Head turning and shoulder shrug  were normal and symmetric. Motor: The motor testing reveals 5 over 5 strength of all 4 extremities. Good symmetric motor tone is noted throughout.  Sensory: Sensory testing is intact to soft touch on all 4 extremities. No  evidence of extinction is noted.  Coordination: Cerebellar testing reveals good finger-nose-finger and heel-to-shin bilaterally.  Gait and station: Gait is normal. Tandem gait is slightly  Unsteady. Romberg is negative. No drift is seen.  Reflexes: Deep tendon reflexes are symmetric and normal bilaterally.    DIAGNOSTIC DATA (LABS, IMAGING, TESTING) - I reviewed patient records, labs, notes, testing and imaging myself where available.  Lab Results  Component Value Date   WBC 6.4 06/23/2013   HGB 12.3 06/23/2013   HCT 37.7 06/23/2013   MCV 88.8 06/23/2013     PLT 305.0 06/23/2013      Component Value Date/Time   NA 138 02/22/2012 0853   K 4.2 02/22/2012 0853   CL 103 02/22/2012 0853   CO2 25 02/22/2012 0853   GLUCOSE 75 02/22/2012 0853   BUN 25* 02/22/2012 0853   CREATININE 0.9 02/22/2012 0853   CALCIUM 9.6 02/22/2012 0853   PROT 7.2 06/23/2013 1029   ALBUMIN 3.4* 06/23/2013 1029   AST 38* 06/23/2013 1029   ALT 41* 06/23/2013 1029   ALKPHOS 137* 06/23/2013 1029   BILITOT 0.3 06/23/2013 1029   GFRNONAA >60 09/03/2009 1030   GFRAA  09/03/2009 1030    >60        The eGFR has been calculated using the MDRD equation. This calculation has not been validated in all clinical situations. eGFR's persistently <60 mL/min signify possible Chronic Kidney Disease.   Lab Results  Component Value Date   CHOL 228* 02/22/2012   HDL 47.70 02/22/2012   LDLDIRECT 168.5 02/22/2012   TRIG 171.0* 02/22/2012   CHOLHDL 5 02/22/2012   No results found for: HGBA1C Lab Results  Component Value Date   VITAMINB12 329 02/25/2009   Lab Results  Component Value Date   TSH 2.39 02/22/2012      ASSESSMENT AND PLAN 73 y.o. year old female  has a past medical history of Depression; GERD (gastroesophageal reflux disease); Hyperlipidemia; Breast cancer; Ovarian cyst; Parotid tumor; Shingles (2000); mitral valve prolapse; Irregular heart beat; Restless legs syndrome; Osteopenia; Optic neuritis;  Pelvic fracture; History of GI bleed; Hiatal hernia; and Reflux esophagitis. here with:  1. Restless leg syndrome 2. Mild essential tremor 3. Hx of iron iron deficiency  The patient states she has been doing well up until one month ago when her restless leg symptoms worsen. She states that this has occurred in the past and her iron and ferritin was low. After infusion her symptoms greatly improved. I will recheck an iron and ferritin level today. The patient also uses hydrocodone for severe pain. Her last refill was in June. I will refill this today. The patient will continue taking Requip 3 mg at bedtime. If her symptoms worsen or she develops new symptoms she should let us know. Otherwise she will follow up in 6 months.    Megan Millikan, MSN, NP-C 04/13/2014, 10:43 AM [facility] [address], [address] [place], [place] [address] [phone]  Note: This document was prepared with digital dictation and possible smart phrase technology. Any transcriptional errors that result from this process are unintentional.  

## 2014-04-13 NOTE — Progress Notes (Signed)
I have read the note, and I agree with the clinical assessment and plan.  Shelley Coleman KEITH   

## 2014-04-13 NOTE — Patient Instructions (Signed)
I will check blood work today- Ferritin and Iron level. Continue taking Requip 3 mg at bedtime.  I will refill hydrocodone/APAP take 1 tablet as needed for pain.

## 2014-04-14 ENCOUNTER — Telehealth: Payer: Self-pay | Admitting: Adult Health

## 2014-04-14 LAB — IRON AND TIBC
Iron Saturation: 32 % (ref 15–55)
Iron: 97 ug/dL (ref 35–155)
TIBC: 303 ug/dL (ref 250–450)
UIBC: 206 ug/dL (ref 150–375)

## 2014-04-14 LAB — FERRITIN: Ferritin: 184 ng/mL — ABNORMAL HIGH (ref 15–150)

## 2014-04-14 NOTE — Telephone Encounter (Signed)
I called the patient. Her lab work was unremarkable. She continues to have discomfort with her restless legs. She takes Requip 3 mg at bedtime and uses hydrocodone for severe pain. She also takes Cymbalta 60 mg daily. At this time she wants to continue with the medication that she is taking. If her restless leg becomes unbearable we will consider increasing the Cymbalta to 90 mg daily.

## 2014-04-14 NOTE — Telephone Encounter (Signed)
Pt is calling requesting lab results.  Please call and advise. °

## 2014-05-01 LAB — HM MAMMOGRAPHY

## 2014-05-14 ENCOUNTER — Telehealth: Payer: Self-pay | Admitting: Adult Health

## 2014-05-14 NOTE — Telephone Encounter (Signed)
Shelley Coleman is needing to know what TBX ppx of iron defenceny of the lab stands for.  Please call and advise. When calling please use [phone]

## 2014-05-15 NOTE — Telephone Encounter (Signed)
plz call patient

## 2014-05-18 MED ORDER — DULOXETINE HCL 30 MG PO CPEP: ORAL_CAPSULE | ORAL | Status: DC

## 2014-05-18 NOTE — Telephone Encounter (Signed)
I called Joann from [facility]- she had a question about the lab specimen however she had already gotten her answer.   I also called the patient. She states that she is doing well. She had been having increased discomfort with her neuropathy. She states that she tried taking 3 tablet of Cymbalta for a total of 90 mg and felt that it worked better. I will send in another prescription increasing her Cymbalta to 1 tablet in the morning and 2 tablet in the evening. She also mentioned that her husband who was battling cancer passed away May 18, 2023 th. She states that she has had a difficult time since then.

## 2014-06-03 ENCOUNTER — Encounter: Payer: Self-pay | Admitting: Family Medicine

## 2014-06-03 ENCOUNTER — Ambulatory Visit (INDEPENDENT_AMBULATORY_CARE_PROVIDER_SITE_OTHER): Payer: Medicare Other | Admitting: Family Medicine

## 2014-06-03 VITALS — BP 148/98 | Temp 98.3°F | Wt 182.0 lb

## 2014-06-03 DIAGNOSIS — R5383 Other fatigue: Secondary | ICD-10-CM | POA: Diagnosis not present

## 2014-06-03 DIAGNOSIS — F329 Major depressive disorder, single episode, unspecified: Secondary | ICD-10-CM

## 2014-06-03 DIAGNOSIS — F32A Depression, unspecified: Secondary | ICD-10-CM

## 2014-06-03 DIAGNOSIS — R251 Tremor, unspecified: Secondary | ICD-10-CM

## 2014-06-03 DIAGNOSIS — G252 Other specified forms of tremor: Principal | ICD-10-CM

## 2014-06-03 DIAGNOSIS — G250 Essential tremor: Secondary | ICD-10-CM

## 2014-06-03 LAB — BASIC METABOLIC PANEL
BUN: 12 mg/dL (ref 6–23)
CALCIUM: 9.7 mg/dL (ref 8.4–10.5)
CO2: 30 mEq/L (ref 19–32)
Chloride: 105 mEq/L (ref 96–112)
Creatinine, Ser: 0.67 mg/dL (ref 0.40–1.20)
GFR: 91.98 mL/min (ref >60.00)
GLUCOSE: 91 mg/dL (ref 70–99)
Potassium: 4.5 mEq/L (ref 3.5–5.1)
SODIUM: 139 meq/L (ref 135–145)

## 2014-06-03 LAB — CBC WITH DIFFERENTIAL/PLATELET
Basophils Absolute: 0.1 10*3/uL (ref 0.0–0.1)
Basophils Relative: 0.8 % (ref 0.0–3.0)
Eosinophils Absolute: 0.3 10*3/uL (ref 0.0–0.7)
Eosinophils Relative: 4.7 % (ref 0.0–5.0)
HEMATOCRIT: 42 % (ref 36.0–46.0)
Hemoglobin: 14.2 g/dL (ref 12.0–15.0)
LYMPHS ABS: 2.4 10*3/uL (ref 0.7–4.0)
Lymphocytes Relative: 34.3 % (ref 12.0–46.0)
MCHC: 33.9 g/dL (ref 30.0–36.0)
MCV: 88.7 fl (ref 78.0–100.0)
MONO ABS: 0.6 10*3/uL (ref 0.1–1.0)
MONOS PCT: 7.8 % (ref 3.0–12.0)
NEUTROS ABS: 3.7 10*3/uL (ref 1.4–7.7)
NEUTROS PCT: 52.4 % (ref 43.0–77.0)
Platelets: 232 10*3/uL (ref 150.0–400.0)
RBC: 4.73 Mil/uL (ref 3.87–5.11)
RDW: 14.4 % (ref 11.5–15.5)
WBC: 7.1 10*3/uL (ref 4.0–10.5)

## 2014-06-03 LAB — TSH: TSH: 2.44 u[IU]/mL (ref 0.35–4.50)

## 2014-06-03 MED ORDER — CITALOPRAM HYDROBROMIDE 20 MG PO TABS: 20.0000 mg | ORAL_TABLET | Freq: Every day | ORAL | Status: DC

## 2014-06-03 NOTE — Patient Instructions (Signed)
Call hospice today and get set up to see one of the bereavement counselors  Call Dr. Jannifer Franklin and discussed with him how to taper off the Cymbalta and when to start the Celexa 20 mg once daily at bedtime  Walk 30 minutes daily  Labs today  BP check every exam for 2 weeks in the morning.......... BP goal 135/85 or less........ if blood pressure not at goal then return with the data and the device for follow-up and reevaluation

## 2014-06-03 NOTE — Progress Notes (Signed)
   Subjective:    Patient ID: Shelley Coleman, female    DOB: [birth year], 74 y.o.   MRN: [id number]  HPI Smith is a 74 year old recently widowed female,,,,,,,,,,, her husband Clair Gulling died the day after Christmas from complications a mesothelioma,,,,, who comes in today for evaluation of depression  In the past she's taken Celexa which is help. She's not been seeing anybody for bereavement counseling. I recommend she call hospice  She takes Tenormin 50 mg when necessary for benign tremor and adequate thiazide 25 mg when necessary for fluid retention.  She's followed by Dr[facility] Jannifer [facility] for neuropathy etiology unknown. She's currently on Cymbalta. I asked her to call Dr. Jannifer Franklin and find out how to taper off the Cymbalta so we can start the Celexa. She doesn't feel like to Cymbalta has helped.  She feels depressed fatigued and nauseated.  Her blood pressure days 140/98. She's never had trouble with hypertension. However 2 brothers and and her father both had hypertension   Review of Systems Review of systems otherwise negative    Objective:   Physical Exam Well-developed well-nourished female no acute distress vital signs stable she's afebrile except for BP 140/98       Assessment & Plan:  Elevated blood pressure......... BP check every morning for 2 weeks......Marland Kitchen return if blood pressure elevated for follow-up and evaluation  Depression secondary to the death of her husband.......Marland Kitchen refer to hospice counseling..... Start Celexa.Marland KitchenMarland KitchenMarland KitchenMarland Kitchen After she's tapered off the Cymbalta

## 2014-06-12 ENCOUNTER — Other Ambulatory Visit: Payer: Self-pay | Admitting: Neurology

## 2014-06-22 ENCOUNTER — Telehealth: Payer: Self-pay

## 2014-06-22 NOTE — Telephone Encounter (Signed)
[employer] has approved the request for coverage (quantity limit exception) on Duloxetine effective until 04/30/2015 Ref # [id number]

## 2014-07-14 DIAGNOSIS — M25532 Pain in left wrist: Secondary | ICD-10-CM | POA: Diagnosis not present

## 2014-07-15 DIAGNOSIS — Z853 Personal history of malignant neoplasm of breast: Secondary | ICD-10-CM | POA: Diagnosis not present

## 2014-07-15 DIAGNOSIS — C50919 Malignant neoplasm of unspecified site of unspecified female breast: Secondary | ICD-10-CM | POA: Diagnosis not present

## 2014-07-15 DIAGNOSIS — D0590 Unspecified type of carcinoma in situ of unspecified breast: Secondary | ICD-10-CM | POA: Diagnosis not present

## 2014-07-22 DIAGNOSIS — H25011 Cortical age-related cataract, right eye: Secondary | ICD-10-CM | POA: Diagnosis not present

## 2014-07-22 DIAGNOSIS — H2511 Age-related nuclear cataract, right eye: Secondary | ICD-10-CM | POA: Diagnosis not present

## 2014-07-22 DIAGNOSIS — H2512 Age-related nuclear cataract, left eye: Secondary | ICD-10-CM | POA: Diagnosis not present

## 2014-07-22 DIAGNOSIS — H25012 Cortical age-related cataract, left eye: Secondary | ICD-10-CM | POA: Diagnosis not present

## 2014-07-25 ENCOUNTER — Other Ambulatory Visit: Payer: Self-pay | Admitting: Family Medicine

## 2014-07-30 ENCOUNTER — Telehealth: Payer: Self-pay | Admitting: Family Medicine

## 2014-07-30 MED ORDER — DEXLANSOPRAZOLE 60 MG PO CPDR: 60.0000 mg | DELAYED_RELEASE_CAPSULE | Freq: Every day | ORAL | Status: DC

## 2014-07-30 NOTE — Telephone Encounter (Signed)
Pt states dr Sherren Mocha has been filling her dexlansoprazole (DEXILANT) 60 MG capsule. (since her previous dr retired) 90 day She is out and needs [employer]

## 2014-08-11 DIAGNOSIS — H2512 Age-related nuclear cataract, left eye: Secondary | ICD-10-CM | POA: Diagnosis not present

## 2014-08-12 DIAGNOSIS — H25041 Posterior subcapsular polar age-related cataract, right eye: Secondary | ICD-10-CM | POA: Diagnosis not present

## 2014-08-12 DIAGNOSIS — H2511 Age-related nuclear cataract, right eye: Secondary | ICD-10-CM | POA: Diagnosis not present

## 2014-08-12 DIAGNOSIS — H25042 Posterior subcapsular polar age-related cataract, left eye: Secondary | ICD-10-CM | POA: Diagnosis not present

## 2014-08-12 LAB — HM MAMMOGRAPHY

## 2014-08-18 DIAGNOSIS — H2511 Age-related nuclear cataract, right eye: Secondary | ICD-10-CM | POA: Diagnosis not present

## 2014-08-27 DIAGNOSIS — Z853 Personal history of malignant neoplasm of breast: Secondary | ICD-10-CM | POA: Diagnosis not present

## 2014-08-27 DIAGNOSIS — Z800 Family history of malignant neoplasm of digestive organs: Secondary | ICD-10-CM | POA: Diagnosis not present

## 2014-09-11 ENCOUNTER — Encounter: Payer: Self-pay | Admitting: Family Medicine

## 2014-09-14 ENCOUNTER — Encounter: Payer: Self-pay | Admitting: Family Medicine

## 2014-09-14 ENCOUNTER — Ambulatory Visit (INDEPENDENT_AMBULATORY_CARE_PROVIDER_SITE_OTHER): Payer: Medicare Other | Admitting: Family Medicine

## 2014-09-14 VITALS — BP 135/85 | Temp 98.7°F | Wt 181.0 lb

## 2014-09-14 DIAGNOSIS — F32A Depression, unspecified: Secondary | ICD-10-CM

## 2014-09-14 DIAGNOSIS — F329 Major depressive disorder, single episode, unspecified: Secondary | ICD-10-CM

## 2014-09-14 MED ORDER — LORAZEPAM 1 MG PO TABS: ORAL_TABLET | ORAL | Status: DC

## 2014-09-14 NOTE — Progress Notes (Signed)
Pre visit review using our clinic review tool, if applicable. No additional management support is needed unless otherwise documented below in the visit note. 

## 2014-09-14 NOTE — Progress Notes (Signed)
   Subjective:    Patient ID: Shelley Coleman, female    DOB: June 07, 1940, 74 y.o.   MRN: [id number]  HPI Shelley Coleman is a 74 year old widowed female........... her husband Clair Gulling died 28-May-2014....... who comes in today for evaluation depression  She states she went to the hospice grief counseling program but did not have a good experience. They explained to her the grief process and told her she was right where she needs to be. She doesn't want to go back there.  She also tapered off the Celexa because her neurologist told her that it causes restless leg syndrome. She also one time was on Cymbalta. She declines to take another antidepressant. She declines to go back to hospice grief counseling program  She has she can't sleep at night because she can't sleep she's tired during the day. She also states that her children are very critical of her   Review of Systems Review of systems otherwise negative    Objective:   Physical Exam  Well-developed well-nourished female no acute distress vital signs stable she's afebrile BP at home 135/85 or less      Assessment & Plan:  Depression...........Marland Kitchen Ativan 1 mg daily at bedtime....... consult with Dr. Dorisann Frames.

## 2014-09-14 NOTE — Patient Instructions (Signed)
Ativan 1 mg..........Marland Kitchen 1/2-1 tablet at bedtime for sleep  Walk 30 minutes daily  Set up an appointment to see Dr. Bernita Buffy nafzinger.......Marland Kitchen our new adult nurse practitioner from [facility] will be taking over for me starting June 1

## 2014-09-30 ENCOUNTER — Ambulatory Visit (INDEPENDENT_AMBULATORY_CARE_PROVIDER_SITE_OTHER): Payer: Medicare Other | Admitting: Psychology

## 2014-09-30 DIAGNOSIS — F331 Major depressive disorder, recurrent, moderate: Secondary | ICD-10-CM

## 2014-10-05 ENCOUNTER — Ambulatory Visit (INDEPENDENT_AMBULATORY_CARE_PROVIDER_SITE_OTHER): Payer: Medicare Other | Admitting: Psychology

## 2014-10-05 DIAGNOSIS — F331 Major depressive disorder, recurrent, moderate: Secondary | ICD-10-CM | POA: Diagnosis not present

## 2014-10-12 ENCOUNTER — Ambulatory Visit (INDEPENDENT_AMBULATORY_CARE_PROVIDER_SITE_OTHER): Payer: Medicare Other | Admitting: Psychology

## 2014-10-12 DIAGNOSIS — F331 Major depressive disorder, recurrent, moderate: Secondary | ICD-10-CM | POA: Diagnosis not present

## 2014-10-19 ENCOUNTER — Ambulatory Visit (INDEPENDENT_AMBULATORY_CARE_PROVIDER_SITE_OTHER): Payer: Medicare Other | Admitting: Psychology

## 2014-10-19 DIAGNOSIS — F331 Major depressive disorder, recurrent, moderate: Secondary | ICD-10-CM | POA: Diagnosis not present

## 2014-10-20 ENCOUNTER — Encounter: Payer: Self-pay | Admitting: *Deleted

## 2014-10-26 ENCOUNTER — Other Ambulatory Visit: Payer: Self-pay

## 2014-10-27 ENCOUNTER — Ambulatory Visit: Payer: Medicare Other | Admitting: Adult Health

## 2014-10-28 ENCOUNTER — Encounter: Payer: Self-pay | Admitting: Adult Health

## 2014-11-04 ENCOUNTER — Ambulatory Visit (INDEPENDENT_AMBULATORY_CARE_PROVIDER_SITE_OTHER): Payer: Medicare Other | Admitting: Adult Health

## 2014-11-04 ENCOUNTER — Encounter: Payer: Self-pay | Admitting: Adult Health

## 2014-11-04 ENCOUNTER — Ambulatory Visit (INDEPENDENT_AMBULATORY_CARE_PROVIDER_SITE_OTHER): Payer: Medicare Other | Admitting: Psychology

## 2014-11-04 VITALS — BP 136/79 | HR 88 | Ht 68.0 in | Wt 180.0 lb

## 2014-11-04 DIAGNOSIS — G2581 Restless legs syndrome: Secondary | ICD-10-CM | POA: Diagnosis not present

## 2014-11-04 DIAGNOSIS — D509 Iron deficiency anemia, unspecified: Secondary | ICD-10-CM | POA: Diagnosis not present

## 2014-11-04 DIAGNOSIS — F331 Major depressive disorder, recurrent, moderate: Secondary | ICD-10-CM | POA: Diagnosis not present

## 2014-11-04 DIAGNOSIS — F4321 Adjustment disorder with depressed mood: Secondary | ICD-10-CM | POA: Diagnosis not present

## 2014-11-04 MED ORDER — HYDROCODONE-ACETAMINOPHEN 7.5-325 MG PO TABS: 1.0000 | ORAL_TABLET | Freq: Four times a day (QID) | ORAL | Status: DC | PRN

## 2014-11-04 NOTE — Progress Notes (Signed)
PATIENT: Shelley Coleman DOB: 1940-12-29  REASON FOR VISIT: follow - RLS and iron deficiency HISTORY FROM: patient  HISTORY OF PRESENT ILLNESS: Shelley Coleman is a 74 year old female with a history of restless leg syndrome and low ferritin and iron. She returns today for follow-up. She continues to take Requip 3 mg at bedtime. She has decreased her Cymbalta to 20 mg at bedtime. She states that the Requip is beneficial for her restless leg. She states that she would be unable to sleep if she did not take it. She does have hydrocodone that she uses only when she has severe pain. The patient feels that her iron and ferritin levels are low again. She feels that she is more fatigued. She states that she is not able to walk long distances without being short of breath. She states that these are the same symptoms she had before. She would like her iron and ferritin levels checked today. Patient's husband passed away in 04/28/2023. Patient states that she has been suffering from grief. She is currently seeking counseling through a church group. She denies any new symptoms. She returns today for an evaluation.  HISTORY 04/13/14: Shelley Coleman is a 73 year old female with a history of restless leg syndrome and low ferritin and iron. She returns today for follow-up. She is currently taking requip 3 mg at bedtime and cymbalta 60 mg daily. She reports that she continues to have problems with RLS. Her symptoms have gotten worse within the last month. She states that her last iron infusion was in June and has not had blood work since. She states these symptoms are similar to what she has had prior to the iron infusion. She states that after an infusion her symptoms greatly improved. She will still have some symptoms but it is a lot better. She reports that her fatigue has gotten worse within the last month. Again this is similar to her symptoms pre-infusion. She states that she gives out easily after walking for a period of time.  She uses hydrocodone for severe pain at night. Her last refill was in June 2015. She is under a lot of stress. Her husband was diagnosed with mesothelioma and has completed chemotherapy. His prognosis is not good.    REVIEW OF SYSTEMS: Out of a complete 14 system review of symptoms, the patient complains only of the following symptoms, and all other reviewed systems are negative.  Activity change, fatigue, ringing in ears, bruise/bleed easily, anemia  ALLERGIES: Allergies  Allergen Reactions  . Diflunisal     REACTION: rash , swelling  anaphlaxsis  . Tetanus Toxoid     REACTION: arm swelling, fever    HOME MEDICATIONS: Outpatient Prescriptions Prior to Visit  Medication Sig Dispense Refill  . atenolol (TENORMIN) 50 MG tablet Take 50 mg by mouth daily. As needed    . citalopram (CELEXA) 20 MG tablet Take 1 tablet (20 mg total) by mouth daily. 100 tablet 3  . dexlansoprazole (DEXILANT) 60 MG capsule Take 1 capsule (60 mg total) by mouth daily. 90 capsule 2  . DULoxetine (CYMBALTA) 20 MG capsule 20 mg. 1 in the am and 2 in the pm    . HYDROcodone-acetaminophen (NORCO) 7.5-325 MG per tablet Take 1 tablet by mouth every 6 (six) hours as needed. 60 tablet 0  . Ibuprofen (ADVIL PO) Take 200 mg by mouth every 4 (four) hours as needed. Take 168m every 6 hours as needed    . LORazepam (ATIVAN) 1 MG  tablet 1/2-1 tablet at bedtime for sleep 60 tablet 1  . nitroGLYCERIN (NITROSTAT) 0.4 MG SL tablet Place 1 tablet (0.4 mg total) under the tongue every 5 (five) minutes as needed. 90 tablet 0  . rOPINIRole (REQUIP) 3 MG tablet TAKE ONE TABLET BY MOUTH AT BEDTIME 90 tablet 1   No facility-administered medications prior to visit.    PAST MEDICAL HISTORY: Past Medical History  Diagnosis Date  . Depression   . GERD (gastroesophageal reflux disease)   . Hyperlipidemia   . Breast cancer   . Ovarian cyst   . Parotid tumor     right  . Shingles 2000    Left in V1  . Hx of mitral valve prolapse    . Irregular heart beat   . Restless legs syndrome   . Osteopenia   . Optic neuritis     left  . Pelvic fracture   . History of GI bleed   . Hiatal hernia   . Reflux esophagitis     PAST SURGICAL HISTORY: Past Surgical History  Procedure Laterality Date  . Appendectomy    . Abdominal hysterectomy    . Spine surgery    . Mastectomy Right   . Breast lumpectomy Left   . Total hip arthroplasty Left   . Hernia repair  08/2009  . Esophageal manometry N/A 01/13/2013    Procedure: ESOPHAGEAL MANOMETRY (EM);  Surgeon: Sable Feil, MD;  Location: [facility];  Service: Endoscopy;  Laterality: N/A;    FAMILY HISTORY: Family History  Problem Relation Age of Onset  . Cancer Mother     sarcoma  . Heart disease Father   . Hypertension Father   . Stroke Father   . Hernia Brother   . Hypertension Brother     SOCIAL HISTORY: History   Social History  . Marital Status: Married    Spouse Name: N/A  . Number of Children: 2  . Years of Education: 16   Occupational History  . Retired Therapist, sports    Social History Main Topics  . Smoking status: Never Smoker   . Smokeless tobacco: Never Used  . Alcohol Use: 0.0 oz/week    0 Standard drinks or equivalent per week     Comment: Rarely,socially  . Drug Use: No  . Sexual Activity: Not on file   Other Topics Concern  . Not on file   Social History Narrative      PHYSICAL EXAM  Filed Vitals:   11/04/14 1302  BP: 136/79  Pulse: 88  Height: $Remove'5\' 8"'FOGtOEQ$  (1.727 m)  Weight: 180 lb (81.647 kg)   Body mass index is 27.38 kg/(m^2).  Generalized: Well developed, in no acute distress   Neurological examination  Mentation: Alert oriented to time, place, history taking. Follows all commands speech and language fluent Cranial nerve II-XII: Pupils were equal round reactive to light. Extraocular movements were full, visual field were full on confrontational test. Facial sensation and strength were normal. Uvula tongue midline. Head turning  and shoulder shrug  were normal and symmetric. Motor: The motor testing reveals 5 over 5 strength of all 4 extremities. Good symmetric motor tone is noted throughout.  Sensory: Sensory testing is intact to soft touch on all 4 extremities- she does feel that sensation is decreased in the left arm. No evidence of extinction is noted.  Coordination: Cerebellar testing reveals good finger-nose-finger and heel-to-shin bilaterally.  Gait and station: Gait is normal. Tandem gait is normal. Romberg is negative. No drift is  seen.  Reflexes: Deep tendon reflexes are symmetric and normal bilaterally.    DIAGNOSTIC DATA (LABS, IMAGING, TESTING) - I reviewed patient records, labs, notes, testing and imaging myself where available.  Lab Results  Component Value Date   WBC 7.1 06/03/2014   HGB 14.2 06/03/2014   HCT 42.0 06/03/2014   MCV 88.7 06/03/2014   PLT 232.0 06/03/2014      Component Value Date/Time   NA 139 06/03/2014 1020   K 4.5 06/03/2014 1020   CL 105 06/03/2014 1020   CO2 30 06/03/2014 1020   GLUCOSE 91 06/03/2014 1020   BUN 12 06/03/2014 1020   CREATININE 0.67 06/03/2014 1020   CALCIUM 9.7 06/03/2014 1020   PROT 7.2 06/23/2013 1029   ALBUMIN 3.4* 06/23/2013 1029   AST 38* 06/23/2013 1029   ALT 41* 06/23/2013 1029   ALKPHOS 137* 06/23/2013 1029   BILITOT 0.3 06/23/2013 1029   GFRNONAA >60 09/03/2009 1030   GFRAA  09/03/2009 1030    >60        The eGFR has been calculated using the MDRD equation. This calculation has not been validated in all clinical situations. eGFR's persistently <60 mL/min signify possible Chronic Kidney Disease.   Lab Results  Component Value Date   CHOL 228* 02/22/2012   HDL 47.70 02/22/2012   LDLDIRECT 168.5 02/22/2012   TRIG 171.0* 02/22/2012   CHOLHDL 5 02/22/2012   No results found for: HGBA1C Lab Results  Component Value Date   VITAMINB12 329 02/25/2009   Lab Results  Component Value Date   TSH 2.44 06/03/2014      ASSESSMENT  AND PLAN 74 y.o. year old female  has a past medical history of Depression; GERD (gastroesophageal reflux disease); Hyperlipidemia; Breast cancer; Ovarian cyst; Parotid tumor; Shingles (2000); mitral valve prolapse; Irregular heart beat; Restless legs syndrome; Osteopenia; Optic neuritis; Pelvic fracture; History of GI bleed; Hiatal hernia; and Reflux esophagitis. here with:  1. Restless leg syndrome 2. Iron deficiency anemia 3. Depression  The patient will continue taking Requip for restless leg syndrome. I will refill hydrocodone to use when she is in severe pain. This medication has not been refilled since December. The patient does feel that she is having symptoms similar to when she was diagnosed with low iron levels. I will check blood work today. I have encouraged patient to continue to participate in grief counseling with her church. If her symptoms worsen or she develops new symptoms she should let us know. She will follow-up in 6 months or sooner if needed.     Ward Givens, MSN, NP-C 11/04/2014, 1:16 PM [facility] [address], [place],  [address] [phone]  Note: This document was prepared with digital dictation and possible smart phrase technology. Any transcriptional errors that result from this process are unintentional.

## 2014-11-04 NOTE — Progress Notes (Signed)
I have read the note, and I agree with the clinical assessment and plan.  WILLIS,CHARLES KEITH   

## 2014-11-04 NOTE — Patient Instructions (Signed)
Continue Requip. Continue hydrocodone for severe pain.  We will check blood work today.

## 2014-11-04 NOTE — Addendum Note (Signed)
Addended by: Trudie Buckler on: 11/04/2014 02:57 PM   Modules accepted: Level of Service

## 2014-11-05 ENCOUNTER — Telehealth: Payer: Self-pay

## 2014-11-05 LAB — IRON AND TIBC
IRON SATURATION: 34 % (ref 15–55)
Iron: 99 ug/dL (ref 27–139)
Total Iron Binding Capacity: 294 ug/dL (ref 250–450)
UIBC: 195 ug/dL (ref 118–369)

## 2014-11-05 LAB — FERRITIN: FERRITIN: 98 ng/mL (ref 15–150)

## 2014-11-05 NOTE — Telephone Encounter (Signed)
Called and spoke to patient relayed labs were ok . Patient was fine.

## 2014-11-05 NOTE — Telephone Encounter (Signed)
-----   Message from Ward Givens, NP sent at 11/05/2014  7:40 AM EDT ----- Lab work ok. Please call the patient.

## 2014-11-09 ENCOUNTER — Encounter: Payer: Self-pay | Admitting: Gastroenterology

## 2014-11-13 ENCOUNTER — Other Ambulatory Visit: Payer: Self-pay | Admitting: Family Medicine

## 2014-11-16 ENCOUNTER — Ambulatory Visit (INDEPENDENT_AMBULATORY_CARE_PROVIDER_SITE_OTHER): Payer: Medicare Other | Admitting: Psychology

## 2014-11-16 DIAGNOSIS — F331 Major depressive disorder, recurrent, moderate: Secondary | ICD-10-CM

## 2014-12-16 ENCOUNTER — Ambulatory Visit (INDEPENDENT_AMBULATORY_CARE_PROVIDER_SITE_OTHER): Payer: Medicare Other | Admitting: Psychology

## 2014-12-16 DIAGNOSIS — F331 Major depressive disorder, recurrent, moderate: Secondary | ICD-10-CM

## 2014-12-18 ENCOUNTER — Other Ambulatory Visit: Payer: Self-pay | Admitting: Family Medicine

## 2014-12-28 ENCOUNTER — Ambulatory Visit: Payer: Medicare Other | Admitting: Psychology

## 2015-01-13 ENCOUNTER — Ambulatory Visit (INDEPENDENT_AMBULATORY_CARE_PROVIDER_SITE_OTHER): Payer: Medicare Other | Admitting: Psychology

## 2015-01-13 DIAGNOSIS — F331 Major depressive disorder, recurrent, moderate: Secondary | ICD-10-CM | POA: Diagnosis not present

## 2015-01-18 ENCOUNTER — Ambulatory Visit (INDEPENDENT_AMBULATORY_CARE_PROVIDER_SITE_OTHER): Payer: Medicare Other | Admitting: Family Medicine

## 2015-01-18 ENCOUNTER — Encounter: Payer: Self-pay | Admitting: Family Medicine

## 2015-01-18 VITALS — BP 130/88 | Temp 99.4°F | Wt 178.0 lb

## 2015-01-18 DIAGNOSIS — I950 Idiopathic hypotension: Secondary | ICD-10-CM

## 2015-01-18 NOTE — Patient Instructions (Addendum)
Purchase a Omron pump up digital blood pressure cuff,,,,,,,,,,,,, [employer]  Check your blood pressure weekly  Blood pressure parameters 140/90 or less  If you get an abnormal reading then I would check your blood pressure daily for 2 weeks to get a bunch the data points. If after 2 weeks your blood pressures are all normal then I would go back and just check it weekly  If our you get 2 weeks of elevated blood pressure then bring the device and all your blood pressure readings and come see [place] for reevaluation  [employer].........Marland Kitchen our new adult nurse practitioner from [facility]

## 2015-01-18 NOTE — Progress Notes (Signed)
   Subjective:    Patient ID: Shelley Coleman, female    DOB: [birth year], 74 y.o.   MRN: [id number]  HPI Savahna is a 74 year old widowed female nonsmoker who comes in today for evaluation of elevated blood pressure  She's never had high blood pressure in the past. Indeed in the past she's had fairly low blood pressure. Her pressures generally run [phone] systolic 70 diastolic. Her med list shows Tenormin 50 mg when necessary for MVP. She has been taking it recently.  She's been monitoring her blood pressure at home she's getting 140/90.   Review of Systems Review of systems otherwise negative except she still dealing with the grief reaction from her husband's death    Objective:   Physical Exam Well-developed well-nourished female no acute distress vital signs stable she's afebrile BP with her machine was 146/96.. With our cuff 120/80 normal       Assessment & Plan:  Normal blood pressure,,,,,,,

## 2015-01-18 NOTE — Progress Notes (Signed)
Pre visit review using our clinic review tool, if applicable. No additional management support is needed unless otherwise documented below in the visit note. 

## 2015-01-25 ENCOUNTER — Ambulatory Visit (INDEPENDENT_AMBULATORY_CARE_PROVIDER_SITE_OTHER): Payer: Medicare Other | Admitting: Adult Health

## 2015-01-25 ENCOUNTER — Encounter: Payer: Self-pay | Admitting: Adult Health

## 2015-01-25 VITALS — BP 140/84 | HR 95 | Temp 99.1°F | Resp 20 | Ht 68.0 in | Wt 179.7 lb

## 2015-01-25 DIAGNOSIS — R030 Elevated blood-pressure reading, without diagnosis of hypertension: Secondary | ICD-10-CM | POA: Diagnosis not present

## 2015-01-25 DIAGNOSIS — IMO0001 Reserved for inherently not codable concepts without codable children: Secondary | ICD-10-CM

## 2015-01-25 DIAGNOSIS — Z23 Encounter for immunization: Secondary | ICD-10-CM

## 2015-01-25 DIAGNOSIS — I10 Essential (primary) hypertension: Secondary | ICD-10-CM | POA: Insufficient documentation

## 2015-01-25 DIAGNOSIS — Z7189 Other specified counseling: Secondary | ICD-10-CM

## 2015-01-25 DIAGNOSIS — F329 Major depressive disorder, single episode, unspecified: Secondary | ICD-10-CM | POA: Diagnosis not present

## 2015-01-25 DIAGNOSIS — Z7689 Persons encountering health services in other specified circumstances: Secondary | ICD-10-CM

## 2015-01-25 DIAGNOSIS — F32A Depression, unspecified: Secondary | ICD-10-CM

## 2015-01-25 MED ORDER — PNEUMOCOCCAL 13-VAL CONJ VACC IM SUSP
0.5000 mL | INTRAMUSCULAR | Status: AC
  Administered 2015-01-25: 0.5 mL via INTRAMUSCULAR

## 2015-01-25 NOTE — Patient Instructions (Signed)
  It was great meeting you today.   Please follow up with me next week so we can talk about what is going on in your life.  Follow up at my next appointment for a wellness exam.   Please let me know if you need anything.

## 2015-01-25 NOTE — Progress Notes (Signed)
Pre visit review using our clinic review tool, if applicable. No additional management support is needed unless otherwise documented below in the visit note. 

## 2015-01-25 NOTE — Progress Notes (Signed)
HPI:  Shelley Coleman is here to establish care. She is a very pleasant 74 year old caucasian female who  has a past medical history of Depression; GERD (gastroesophageal reflux disease); Hyperlipidemia; Breast cancer; Ovarian cyst; Parotid tumor; Shingles (2000); mitral valve prolapse; Irregular heart beat; Restless legs syndrome; Osteopenia; Optic neuritis; Pelvic fracture; History of GI bleed; Hiatal hernia; and Reflux esophagitis.  Last PCP and physical: 2013 Immunizations: UTD  Diet: Does Not eat healthy  Exercise: Does not exercise, but does work around the yard.  Colonoscopy:2011 [id number] Pap Smear: has had hysterectomy  Mammogram:07/2014  Followed by:   [facility] for Breast Cancer - Last saw in April 2016, normal.  [facility] - Dr. Jannifer Franklin for neuropathy  Psychiatry- Dr. Glennon [facility] at [facility]    Has the following chronic problems that require follow up and concerns today:  Depression - She has been battling depression for many years at this point. Her husband passed away two years ago,they were married for 50+ years. Per patient," I have just never gotten over that". She also feels as though she is a burden on her children and does not want to bother them since they have families and there own stuff to deal with. She was placed on Celexa, feels like it helped when she was taking it but stopped taking it due to side effect of restless legs, which she suffered from before. She feels as though the Celexa made it worse. She denies any SI or self harm.   She states " I just feel like curling up into a ball on most days. I do not want to go outside, I do not want to meet friends out for food, all I want to do is sleep."  Heart Palpitations She endorses that she has occasional palpitations, this is a chronic issue that she has been worked up in the past. She takes Atenolol as needed for her palpations.    ROS negative for unless reported above:  fevers, chills, feeling poorly, unintentional weight loss, hearing or vision loss, chest pain, leg claudication, struggling to breath,Not feeling congested in the chest, no orthopenia, no cough,no wheezing, normal appetite, no soft tissue swelling, no hemoptysis, melena, hematochezia, hematuria, loc, si, or thoughts of self harm.   Past Medical History  Diagnosis Date  . Depression   . GERD (gastroesophageal reflux disease)   . Hyperlipidemia   . Breast cancer   . Ovarian cyst   . Parotid tumor     right  . Shingles 2000    Left in V1  . Hx of mitral valve prolapse   . Irregular heart beat   . Restless legs syndrome   . Osteopenia   . Optic neuritis     left  . Pelvic fracture   . History of GI bleed   . Hiatal hernia   . Reflux esophagitis     Past Surgical History  Procedure Laterality Date  . Appendectomy    . Abdominal hysterectomy    . Spine surgery    . Mastectomy Right   . Breast lumpectomy Left   . Total hip arthroplasty Left   . Hernia repair  08/2009  . Esophageal manometry N/A 01/13/2013    Procedure: ESOPHAGEAL MANOMETRY (EM);  Surgeon: Sable Feil, MD;  Location: [facility];  Service: Endoscopy;  Laterality: N/A;    Family History  Problem Relation Age of Onset  . Cancer Mother     sarcoma  . Heart  disease Father   . Hypertension Father   . Stroke Father   . Hernia Brother   . Hypertension Brother     Social History   Social History  . Marital Status: Married    Spouse Name: N/A  . Number of Children: 2  . Years of Education: 16   Occupational History  . Retired Therapist, sports    Social History Main Topics  . Smoking status: Never Smoker   . Smokeless tobacco: Never Used  . Alcohol Use: 0.0 oz/week    0 Standard drinks or equivalent per week     Comment: Rarely,socially  . Drug Use: No  . Sexual Activity: Not Asked   Other Topics Concern  . None   Social History Narrative     Current outpatient prescriptions:  .  atenolol  (TENORMIN) 50 MG tablet, Take 50 mg by mouth daily. As needed, Disp: , Rfl:  .  dexlansoprazole (DEXILANT) 60 MG capsule, Take 1 capsule (60 mg total) by mouth daily., Disp: 90 capsule, Rfl: 2 .  HYDROcodone-acetaminophen (NORCO) 7.5-325 MG per tablet, Take 1 tablet by mouth every 6 (six) hours as needed., Disp: 60 tablet, Rfl: 0 .  Ibuprofen (ADVIL PO), Take 200 mg by mouth every 4 (four) hours as needed. Take 100mg  every 6 hours as needed, Disp: , Rfl:  .  LORazepam (ATIVAN) 1 MG tablet, 1/2-1 tablet at bedtime for sleep, Disp: 60 tablet, Rfl: 1 .  NITROSTAT 0.4 MG SL tablet, PLACE ONE TABLET UNDER THE TONGUE EVERY 5 MINUTES AS NEEDED., Disp: 100 tablet, Rfl: 0 .  rOPINIRole (REQUIP) 3 MG tablet, TAKE ONE TABLET BY MOUTH AT BEDTIME, Disp: 90 tablet, Rfl: 1  EXAM:  Filed Vitals:   01/25/15 1307  BP: 140/84  Pulse: 95  Temp: 99.1 F (37.3 C)  Resp: 20    Body mass index is 27.33 kg/(m^2).  GENERAL: vitals reviewed and listed above, alert, oriented, appears well hydrated and in no acute distress  HEENT: atraumatic, conjunttiva clear, no obvious abnormalities on inspection of external nose and ears. She wears a hearing aid in her right ear. No cerumen impaction.   NECK: Neck is soft and supple without masses, no adenopathy or thyromegaly, trachea midline, no JVD. Normal range of motion.   LUNGS: clear to auscultation bilaterally, no wheezes, rales or rhonchi, good air movement  CV: Regular rate and rhythm, normal S1/S2, no audible murmurs, gallops, or rubs. No carotid bruit and no peripheral edema.   MS: moves all extremities without noticeable abnormality. No edema noted  Abd: soft/nontender/nondistended/normal bowel sounds   Skin: warm and dry, no rash   Extremities: No clubbing, cyanosis, or edema. Capillary refill is WNL. Pulses intact bilaterally in upper and lower extremities.   Neuro: CN II-XII intact, sensation and reflexes normal throughout, 5/5 muscle strength in  bilateral upper and lower extremities. Normal finger to nose. Normal rapid alternating movements. Normal romberg. No pronator drift.   PSYCH: pleasant and cooperative, she is obviously depressed and appears down.   ASSESSMENT AND PLAN:  1. Encounter to establish care - Follow up at next available appointment for CPE - Follow up sooner if needed - Stressed importance of exercise and a healthy diet.   2. Elevated blood pressure - BP: 140/84 mmHg in office today. Will monitor and consider starting antihypertensive at next visit.  - Continue to monitor at home  3. Encounter for immunization - High dose flu shot given   4. Need for prophylactic vaccination against Streptococcus  pneumoniae (pneumococcus) - pneumococcal 13-valent conjugate vaccine (PREVNAR 13) injection 0.5 mL; Inject 0.5 mLs into the muscle tomorrow at 10 am.  5. Depression - Follow up in one week for this issue - Continue to follow up with Dr. Glennon Hamilton - Consider psychiatrist  - Consider medications at next visit, depending on what PHQ 9 is.  - Go to the ER with any thoughts of SI or self harm  -We reviewed the PMH, PSH, FH, SH, Meds and Allergies. -We provided refills for any medications we will prescribe as needed. -We addressed current concerns per orders and patient instructions. -We have asked for records for pertinent exams, studies, vaccines and notes from previous providers. -We have advised patient to follow up per instructions below.   -Patient advised to return or notify a provider immediately if symptoms worsen or persist or new concerns arise.  Dorothyann Peng, AGNP

## 2015-01-27 ENCOUNTER — Ambulatory Visit (INDEPENDENT_AMBULATORY_CARE_PROVIDER_SITE_OTHER): Payer: Medicare Other | Admitting: Psychology

## 2015-01-27 DIAGNOSIS — F331 Major depressive disorder, recurrent, moderate: Secondary | ICD-10-CM | POA: Diagnosis not present

## 2015-02-02 ENCOUNTER — Ambulatory Visit: Payer: Self-pay | Admitting: Adult Health

## 2015-02-04 ENCOUNTER — Ambulatory Visit (INDEPENDENT_AMBULATORY_CARE_PROVIDER_SITE_OTHER): Payer: Medicare Other | Admitting: Adult Health

## 2015-02-04 ENCOUNTER — Encounter: Payer: Self-pay | Admitting: Adult Health

## 2015-02-04 VITALS — BP 124/86 | Temp 98.5°F | Ht 68.0 in | Wt 178.0 lb

## 2015-02-04 DIAGNOSIS — F329 Major depressive disorder, single episode, unspecified: Secondary | ICD-10-CM

## 2015-02-04 DIAGNOSIS — F32A Depression, unspecified: Secondary | ICD-10-CM

## 2015-02-04 DIAGNOSIS — G479 Sleep disorder, unspecified: Secondary | ICD-10-CM

## 2015-02-04 MED ORDER — BUPROPION HCL ER (SR) 150 MG PO TB12: 150.0000 mg | ORAL_TABLET | Freq: Two times a day (BID) | ORAL | Status: DC

## 2015-02-04 MED ORDER — ALPRAZOLAM 1 MG PO TABS: 1.0000 mg | ORAL_TABLET | Freq: Every day | ORAL | Status: DC

## 2015-02-04 NOTE — Progress Notes (Signed)
Subjective:    Patient ID: Shelley Coleman, female    DOB: [birth year], 74 y.o.   MRN: [id number]  HPI  74 year old female who presents to the office today for depression. Her husband passed away in 27-Apr-2023 of last year and it is coming up on the anniversary.  She feels as though she has not been able to get over his death. She has a lot of support from family and friends but feels like it is too much. She tries to be active but she does not always feel like leaving the house. She is spending time with her grandchildren and is working around the house.   She denies any suicidal thoughts or actions. Is having trouble sleeping.   She is seeing Dr.Bray in this office  Review of Systems  Constitutional: Positive for activity change and fatigue. Negative for fever and appetite change.  Respiratory: Negative.   Cardiovascular: Negative.   Neurological: Negative.   Hematological: Negative.   Psychiatric/Behavioral: Positive for sleep disturbance and decreased concentration. Negative for suicidal ideas, behavioral problems, confusion and agitation.  All other systems reviewed and are negative.  Past Medical History  Diagnosis Date  . Depression   . GERD (gastroesophageal reflux disease)   . Hyperlipidemia   . Breast cancer ([place])     right  . Ovarian cyst   . Parotid tumor     right  . Shingles 2000    Left in V1  . Hx of mitral valve prolapse   . Irregular heart beat   . Restless legs syndrome   . Osteopenia   . Optic neuritis     left  . Pelvic fracture ([place])   . History of GI bleed   . Hiatal hernia   . Anemia     Has had iron infusions  . Depression     Social History   Social History  . Marital Status: Married    Spouse Name: N/A  . Number of Children: 2  . Years of Education: 16   Occupational History  . Retired Therapist, sports    Social History Main Topics  . Smoking status: Never Smoker   . Smokeless tobacco: Never Used  . Alcohol Use: 0.0 oz/week    0 Standard drinks  or equivalent per week     Comment: Rarely,socially  . Drug Use: No  . Sexual Activity: Not on file   Other Topics Concern  . Not on file   Social History Narrative   Retired from nursing    Widowed           Past Surgical History  Procedure Laterality Date  . Appendectomy    . Abdominal hysterectomy    . Spine surgery    . Mastectomy Right   . Breast lumpectomy Left   . Total hip arthroplasty Left   . Hernia repair  08/2009  . Esophageal manometry N/A 01/13/2013    Procedure: ESOPHAGEAL MANOMETRY (EM);  Surgeon: Sable Feil, MD;  Location: [facility];  Service: Endoscopy;  Laterality: N/A;    Family History  Problem Relation Age of Onset  . Cancer Mother     Angio sarcoma   . Heart disease Father   . Hypertension Father   . Stroke Father     x2  . Hernia Brother   . Hypertension Brother   . Heart attack Father     x 11    Allergies  Allergen Reactions  . Diflunisal  REACTION: rash , swelling  anaphlaxsis  . Tetanus Toxoid     REACTION: arm swelling, fever    Current Outpatient Prescriptions on File Prior to Visit  Medication Sig Dispense Refill  . atenolol (TENORMIN) 50 MG tablet Take 50 mg by mouth daily. As needed    . dexlansoprazole (DEXILANT) 60 MG capsule Take 1 capsule (60 mg total) by mouth daily. 90 capsule 2  . HYDROcodone-acetaminophen (NORCO) 7.5-325 MG per tablet Take 1 tablet by mouth every 6 (six) hours as needed. 60 tablet 0  . Ibuprofen (ADVIL PO) Take 200 mg by mouth every 4 (four) hours as needed. Take 100mg  every 6 hours as needed    . NITROSTAT 0.4 MG SL tablet PLACE ONE TABLET UNDER THE TONGUE EVERY 5 MINUTES AS NEEDED. 100 tablet 0  . rOPINIRole (REQUIP) 3 MG tablet TAKE ONE TABLET BY MOUTH AT BEDTIME 90 tablet 1   No current facility-administered medications on file prior to visit.    BP 124/86 mmHg  Temp(Src) 98.5 F (36.9 C) (Oral)  Ht 5\' 8"  (1.727 m)  Wt 178 lb (80.74 kg)  BMI 27.07 kg/m2       Objective:    Physical Exam  Constitutional: She is oriented to person, place, and time. She appears well-developed and well-nourished. No distress.  Cardiovascular: Normal rate, regular rhythm, normal heart sounds and intact distal pulses.  Exam reveals no gallop and no friction rub.   No murmur heard. Pulmonary/Chest: Effort normal and breath sounds normal. No respiratory distress. She has no wheezes. She has no rales. She exhibits no tenderness.  Neurological: She is alert and oriented to person, place, and time.  Skin: Skin is warm and dry. No rash noted. She is not diaphoretic. No erythema. No pallor.  Psychiatric: Her behavior is normal. Judgment and thought content normal.  She appears depressed and sad. Tearful during exam at times.   Nursing note and vitals reviewed.      Assessment & Plan:  1. Depression - PHQ 9 score of 18 - Continue to follow up with Dr. Glennon Hamilton  - Follow up with me in 3 weeks or sooner if needed - buPROPion (WELLBUTRIN SR) 150 MG 12 hr tablet; Take 1 tablet (150 mg total) by mouth 2 (two) times daily.  Dispense: 30 tablet; Refill: 3 - Try and be more active ( silver sneakers, take dog on a walk).  - Needs to take time for herself.  - Any thoughts of SI or self harm, please go to the ER 2. Sleep disturbance - Melatonin as needed  - buPROPion (WELLBUTRIN SR) 150 MG 12 hr tablet; Take 1 tablet (150 mg total) by mouth 2 (two) times daily.  Dispense: 30 tablet; Refill: 3 - ALPRAZolam (XANAX) 1 MG tablet; Take 1 tablet (1 mg total) by mouth at bedtime.  Dispense: 30 tablet; Refill: 0 - No to use Melatonin and Xanax together.

## 2015-02-04 NOTE — Patient Instructions (Addendum)
It was great seeing you today!  Start taking the Wellbutrin twice a day. You can start it tonight.  While you are at the drug store, pick up 5mg  Melatonin, see if that helps you sleep.   Use the Xanax as needed before bed. Do not take both the melatonin and Xanax together.   Discontinue to the Ativan   Get out and be active but take time for yourself as well. Go get a massage or spend the entire day at the spa. Just make sure you are doing it for yourself. You deserve it!!  If you need anything, please let me know

## 2015-02-04 NOTE — Progress Notes (Signed)
Pre visit review using our clinic review tool, if applicable. No additional management support is needed unless otherwise documented below in the visit note. 

## 2015-02-10 ENCOUNTER — Ambulatory Visit (INDEPENDENT_AMBULATORY_CARE_PROVIDER_SITE_OTHER): Payer: Medicare Other | Admitting: Psychology

## 2015-02-10 DIAGNOSIS — F331 Major depressive disorder, recurrent, moderate: Secondary | ICD-10-CM | POA: Diagnosis not present

## 2015-02-11 ENCOUNTER — Other Ambulatory Visit (INDEPENDENT_AMBULATORY_CARE_PROVIDER_SITE_OTHER): Payer: Medicare Other

## 2015-02-11 DIAGNOSIS — D649 Anemia, unspecified: Secondary | ICD-10-CM

## 2015-02-11 DIAGNOSIS — E785 Hyperlipidemia, unspecified: Secondary | ICD-10-CM

## 2015-02-11 DIAGNOSIS — Z0000 Encounter for general adult medical examination without abnormal findings: Secondary | ICD-10-CM

## 2015-02-11 DIAGNOSIS — E039 Hypothyroidism, unspecified: Secondary | ICD-10-CM | POA: Diagnosis not present

## 2015-02-11 DIAGNOSIS — I519 Heart disease, unspecified: Secondary | ICD-10-CM

## 2015-02-11 LAB — POCT URINALYSIS DIPSTICK
BILIRUBIN UA: NEGATIVE
Blood, UA: NEGATIVE
GLUCOSE UA: NEGATIVE
KETONES UA: NEGATIVE
Nitrite, UA: NEGATIVE
Protein, UA: NEGATIVE
SPEC GRAV UA: 1.02
UROBILINOGEN UA: 0.2
pH, UA: 6

## 2015-02-11 LAB — BASIC METABOLIC PANEL
BUN: 15 mg/dL (ref 6–23)
CHLORIDE: 107 meq/L (ref 96–112)
CO2: 28 meq/L (ref 19–32)
Calcium: 9.7 mg/dL (ref 8.4–10.5)
Creatinine, Ser: 0.76 mg/dL (ref 0.40–1.20)
GFR: 79.37 mL/min (ref >60.00)
Glucose, Bld: 101 mg/dL — ABNORMAL HIGH (ref 70–99)
Potassium: 4.8 mEq/L (ref 3.5–5.1)
SODIUM: 143 meq/L (ref 135–145)

## 2015-02-11 LAB — CBC WITH DIFFERENTIAL/PLATELET
Basophils Absolute: 0.1 10*3/uL (ref 0.0–0.1)
Basophils Relative: 1.4 % (ref 0.0–3.0)
EOS PCT: 3.5 % (ref 0.0–5.0)
Eosinophils Absolute: 0.2 10*3/uL (ref 0.0–0.7)
HCT: 41.3 % (ref 36.0–46.0)
Hemoglobin: 13.8 g/dL (ref 12.0–15.0)
Lymphocytes Relative: 36.2 % (ref 12.0–46.0)
Lymphs Abs: 2.1 10*3/uL (ref 0.7–4.0)
MCHC: 33.4 g/dL (ref 30.0–36.0)
MCV: 88.4 fl (ref 78.0–100.0)
MONOS PCT: 8.8 % (ref 3.0–12.0)
Monocytes Absolute: 0.5 10*3/uL (ref 0.1–1.0)
NEUTROS ABS: 2.9 10*3/uL (ref 1.4–7.7)
NEUTROS PCT: 50.1 % (ref 43.0–77.0)
PLATELETS: 226 10*3/uL (ref 150.0–400.0)
RBC: 4.68 Mil/uL (ref 3.87–5.11)
RDW: 14.4 % (ref 11.5–15.5)
WBC: 5.7 10*3/uL (ref 4.0–10.5)

## 2015-02-11 LAB — LIPID PANEL
CHOLESTEROL: 256 mg/dL — AB (ref 0–200)
HDL: 59.7 mg/dL (ref >39.00)
LDL Cholesterol: 168 mg/dL — ABNORMAL HIGH (ref 0–99)
NONHDL: 196.2
Total CHOL/HDL Ratio: 4
Triglycerides: 142 mg/dL (ref 0.0–149.0)
VLDL: 28.4 mg/dL (ref 0.0–40.0)

## 2015-02-11 LAB — HEPATIC FUNCTION PANEL
ALK PHOS: 87 U/L (ref 39–117)
ALT: 13 U/L (ref 0–35)
AST: 16 U/L (ref 0–37)
Albumin: 4.1 g/dL (ref 3.5–5.2)
BILIRUBIN DIRECT: 0.1 mg/dL (ref 0.0–0.3)
BILIRUBIN TOTAL: 0.5 mg/dL (ref 0.2–1.2)
Total Protein: 7.1 g/dL (ref 6.0–8.3)

## 2015-02-11 LAB — TSH: TSH: 2.58 u[IU]/mL (ref 0.35–4.50)

## 2015-02-17 ENCOUNTER — Ambulatory Visit (INDEPENDENT_AMBULATORY_CARE_PROVIDER_SITE_OTHER): Payer: Medicare Other | Admitting: Adult Health

## 2015-02-17 ENCOUNTER — Encounter: Payer: Self-pay | Admitting: Adult Health

## 2015-02-17 VITALS — BP 110/70 | Temp 98.8°F | Ht 67.0 in | Wt 176.8 lb

## 2015-02-17 DIAGNOSIS — Z0000 Encounter for general adult medical examination without abnormal findings: Secondary | ICD-10-CM

## 2015-02-17 DIAGNOSIS — E785 Hyperlipidemia, unspecified: Secondary | ICD-10-CM | POA: Diagnosis not present

## 2015-02-17 NOTE — Progress Notes (Signed)
Pre visit review using our clinic review tool, if applicable. No additional management support is needed unless otherwise documented below in the visit note. 

## 2015-02-17 NOTE — Progress Notes (Addendum)
Subjective:  Patient presents today for their annual wellness visit.    Preventive Screening-Counseling & Management  Smoking Status: Never Smoker Second Hand Smoking status:No smokers in home  Risk Factors Regular exercise: Does not exercise - she would like to join [employer] Diet: Does not eat as healthy as she would like to Fall Risk: One fall this year, in April, when she tripped and injured her wrist. She has had no falls since that time  Cardiac risk factors:  advanced age (older than 10 for men, 28 for women)  Hyperlipidemia  No diabetes.  Family History: Father with heart disease,stroke,hypertension, and multiple MI's.   Depression Screen None. PHQ2 14. She has been started on Wellbutrin  Activities of Daily Living  Independent ADLs and IADLs   Hearing Difficulties:She wears hearing aids, is going to have a hearing exam.   Cognitive Testing No reported trouble.   Normal 3 word recall  List the Names of Other Physician/Practitioners you currently use: 1.Dr. Jannifer Franklin- Neurology 2. Optho    Immunization History  Administered Date(s) Administered  . Influenza Split 01/11/2012  . Influenza Whole 01/22/2009  . Influenza, High Dose Seasonal PF 04/06/2014, 01/25/2015  . Influenza,inj,Quad PF,36+ Mos 01/22/2013  . Pneumococcal Conjugate-13 01/25/2015  . Pneumococcal Polysaccharide-23 09/16/2007  . Td 05/01/2006  . Zoster 09/16/2007   Required Immunizations needed today: UTD  Screening tests- up to date Health Maintenance Due  Topic Date Due  . DEXA SCAN  07/05/2007    ROS- During her last visit she on 02/04/2015 she was started on Wellbutrin. Today in the office she endorses that she feels as though her depression is improving, she has a little more energy, and she is getting things done around the house.    The following were reviewed and entered/updated in epic: Past Medical History  Diagnosis Date  . Depression   . GERD  (gastroesophageal reflux disease)   . Hyperlipidemia   . Breast cancer ([place])     right  . Ovarian cyst   . Parotid tumor     right  . Shingles 2000    Left in V1  . Hx of mitral valve prolapse   . Irregular heart beat   . Restless legs syndrome   . Osteopenia   . Optic neuritis     left  . Pelvic fracture ([place])   . History of GI bleed   . Hiatal hernia   . Anemia     Has had iron infusions  . Depression    Patient Active Problem List   Diagnosis Date Noted  . Elevated blood pressure 01/25/2015  . Fatigue due to depression 06/03/2014  . Dysplastic nevus of trunk 04/01/2012  . Depression 03/19/2012  . Thoracic or lumbosacral neuritis or radiculitis, unspecified 01/10/2012  . Scoliosis (and kyphoscoliosis), idiopathic 01/10/2012  . Malignant neoplasm of breast (female), unspecified site 01/10/2012  . Essential and other specified forms of tremor 01/10/2012  . Optic neuritis, unspecified 01/10/2012  . Other specified visual disturbances 01/10/2012  . Restless legs syndrome (RLS) 01/10/2012  . Pain in limb 01/10/2012  . Lumbosacral root lesions, not elsewhere classified 01/10/2012  . Back pain 06/27/2011  . Dysphasia 06/27/2011  . PERIPHERAL NEUROPATHY 07/13/2009  . CARCINOMA, BASAL CELL, BACK 10/05/2007  . BREAST CANCER, HX OF 08/01/2007  . HYPERLIPIDEMIA 03/07/2007  . GERD 03/07/2007   Past Surgical History  Procedure Laterality Date  . Appendectomy    . Abdominal hysterectomy    . Spine surgery    .  Mastectomy Right   . Breast lumpectomy Left   . Total hip arthroplasty Left   . Hernia repair  08/2009  . Esophageal manometry N/A 01/13/2013    Procedure: ESOPHAGEAL MANOMETRY (EM);  Surgeon: Sable Feil, MD;  Location: [facility];  Service: Endoscopy;  Laterality: N/A;    Family History  Problem Relation Age of Onset  . Cancer Mother     Angio sarcoma   . Heart disease Father   . Hypertension Father   . Stroke Father     x2  . Hernia Brother   .  Hypertension Brother   . Heart attack Father     x 11    Medications- reviewed and updated Current Outpatient Prescriptions  Medication Sig Dispense Refill  . ALPRAZolam (XANAX) 1 MG tablet Take 1 tablet (1 mg total) by mouth at bedtime. 30 tablet 0  . atenolol (TENORMIN) 50 MG tablet Take 50 mg by mouth daily. As needed    . buPROPion (WELLBUTRIN SR) 150 MG 12 hr tablet Take 1 tablet (150 mg total) by mouth 2 (two) times daily. 30 tablet 3  . dexlansoprazole (DEXILANT) 60 MG capsule Take 1 capsule (60 mg total) by mouth daily. 90 capsule 2  . HYDROcodone-acetaminophen (NORCO) 7.5-325 MG per tablet Take 1 tablet by mouth every 6 (six) hours as needed. 60 tablet 0  . Ibuprofen (ADVIL PO) Take 200 mg by mouth every 4 (four) hours as needed. Take 100mg  every 6 hours as needed    . NITROSTAT 0.4 MG SL tablet PLACE ONE TABLET UNDER THE TONGUE EVERY 5 MINUTES AS NEEDED. 100 tablet 0  . rOPINIRole (REQUIP) 3 MG tablet TAKE ONE TABLET BY MOUTH AT BEDTIME 90 tablet 1   No current facility-administered medications for this visit.    Allergies-reviewed and updated Allergies  Allergen Reactions  . Diflunisal     REACTION: rash , swelling  anaphlaxsis  . Tetanus Toxoid     REACTION: arm swelling, fever    Social History   Social History  . Marital Status: Married    Spouse Name: N/A  . Number of Children: 2  . Years of Education: 16   Occupational History  . Retired Therapist, sports    Social History Main Topics  . Smoking status: Never Smoker   . Smokeless tobacco: Never Used  . Alcohol Use: 0.0 oz/week    0 Standard drinks or equivalent per week     Comment: Rarely,socially  . Drug Use: No  . Sexual Activity: Not Asked   Other Topics Concern  . None   Social History Narrative   Retired from nursing    Widowed           Objective: BP 110/70 mmHg  Temp(Src) 98.8 F (37.1 C) (Oral)  Ht 5\' 7"  (1.702 m)  Wt 176 lb 12.8 oz (80.196 kg)  BMI 27.68 kg/m2  GENERAL: vitals reviewed and  listed above, alert, oriented, appears well hydrated and in no acute distress. She appears slightly happier than the last time I saw her.   HEENT: atraumatic, conjunttiva clear, no obvious abnormalities on inspection of external nose and ears. She wears a hearing aid in her right ear. No cerumen impaction.   NECK: Neck is soft and supple without masses, no adenopathy or thyromegaly, trachea midline, no JVD. Normal range of motion.   LUNGS: clear to auscultation bilaterally, no wheezes, rales or rhonchi, good air movement  CV: Regular rate and rhythm, normal S1/S2, no audible murmurs, gallops,  or rubs. No carotid bruit and no peripheral edema.   MS: moves all extremities without noticeable abnormality. No edema noted  Abd: soft/nontender/nondistended/normal bowel sounds   Breast: Refused  Skin: warm and dry, no rash   Extremities: No clubbing, cyanosis, or edema. Capillary refill is WNL. Pulses intact bilaterally in upper and lower extremities.   Neuro: CN II-XII intact, sensation and reflexes normal throughout, 5/5 muscle strength in bilateral upper and lower extremities. Normal finger to nose. Normal rapid alternating movements.  PSYCH: pleasant and cooperative, she has periods of sadness during this office visit   Return precautions advised.   Assessment/Plan:  1. Encounter for Medicare annual wellness exam -Follow up in 1-2 months to discuss depression -Start exercising - join silver sneakers - Start cooking healthier meals - Labs reviewed  2. Hyperlipidemia - She does not want to go on a statin medication at this time.  - Her 10 year risk of CVD event is 12.2 - She is aware of consequences of not going on statin medication     These are the goals we discussed: Goals    . Decrease the likelihood of falling    . Exercise 3x per week (30 min per time)       This is a list of the screening recommended for you and due dates:  Health Maintenance  Topic Date Due  .  DEXA scan (bone density measurement)  07/05/2007  . Flu Shot  11/30/2015  . Tetanus Vaccine  05/01/2016  . Mammogram  08/11/2016  . Colon Cancer Screening  07/27/2019  . Shingles Vaccine  Completed  . Pneumonia vaccines  Completed

## 2015-02-17 NOTE — Patient Instructions (Addendum)
It was great seeing you again! I am so happy that the Wellbutrin is starting to work.   Follow up with me in the next month or two in order to see how you are doing.   If you need anything in the meantime, please let me know.   I would like your goal to be to start an exercise program.

## 2015-02-22 ENCOUNTER — Other Ambulatory Visit: Payer: Self-pay | Admitting: Adult Health

## 2015-02-22 MED ORDER — HYDROCODONE-ACETAMINOPHEN 7.5-325 MG PO TABS: 1.0000 | ORAL_TABLET | Freq: Four times a day (QID) | ORAL | Status: DC | PRN

## 2015-02-22 NOTE — Telephone Encounter (Signed)
Pt called requesting refill for HYDROcodone-acetaminophen (NORCO) 7.5-325 MG per tablet . Pt aware RX will be ready within 24 hours unless otherwise informed by RN.

## 2015-02-24 ENCOUNTER — Ambulatory Visit (INDEPENDENT_AMBULATORY_CARE_PROVIDER_SITE_OTHER): Payer: Medicare Other | Admitting: Psychology

## 2015-02-24 DIAGNOSIS — F331 Major depressive disorder, recurrent, moderate: Secondary | ICD-10-CM

## 2015-02-25 ENCOUNTER — Ambulatory Visit: Payer: Self-pay | Admitting: Adult Health

## 2015-03-10 ENCOUNTER — Ambulatory Visit (INDEPENDENT_AMBULATORY_CARE_PROVIDER_SITE_OTHER): Payer: Medicare Other | Admitting: Psychology

## 2015-03-10 DIAGNOSIS — F331 Major depressive disorder, recurrent, moderate: Secondary | ICD-10-CM

## 2015-03-19 ENCOUNTER — Ambulatory Visit (INDEPENDENT_AMBULATORY_CARE_PROVIDER_SITE_OTHER): Payer: Medicare Other | Admitting: Adult Health

## 2015-03-19 ENCOUNTER — Encounter: Payer: Self-pay | Admitting: Adult Health

## 2015-03-19 VITALS — BP 122/82 | Temp 98.2°F | Ht 67.0 in | Wt 173.6 lb

## 2015-03-19 DIAGNOSIS — G479 Sleep disorder, unspecified: Secondary | ICD-10-CM

## 2015-03-19 DIAGNOSIS — F329 Major depressive disorder, single episode, unspecified: Secondary | ICD-10-CM | POA: Diagnosis not present

## 2015-03-19 DIAGNOSIS — F32A Depression, unspecified: Secondary | ICD-10-CM

## 2015-03-19 MED ORDER — BUPROPION HCL ER (SR) 150 MG PO TB12: 150.0000 mg | ORAL_TABLET | Freq: Two times a day (BID) | ORAL | Status: DC

## 2015-03-19 NOTE — Progress Notes (Signed)
Subjective:    Patient ID: Shelley Coleman, female    DOB: 12-08-1940, 74 y.o.   MRN: [id number]  HPI 74 year old female who presents to the office today for 1 month follow up regarding her depression. I had started her on Wellbutrin 150mg  BID on February 04, 2015 and she has been following up with Dr. Glennon Hamilton on a regular basis. I advised her to join silver sneakers in order to get out of the house and to get some exercise.   Today she reports that she feels like the the Wellbutrin is really working. She feels more upbeat and although she does have low periods they are not as frequent as they used to be.   The one year anniversary of her husbands death will be the day after Christmas. She understands that this is going to be a terribly tough time for her to get through but she is determined to get through it with family and friends.   She continues to spend a lot of time with her grandchildren and helps with getting them to all of their sporting activities. Ha snot joined silver sneakers yet.    She is using Melatonin to help her sleep.   Review of Systems  Constitutional: Negative.   Respiratory: Negative.   Cardiovascular: Negative.   Psychiatric/Behavioral: Negative for suicidal ideas, sleep disturbance, self-injury, decreased concentration and agitation.       Episodes of depression  All other systems reviewed and are negative.  Past Medical History  Diagnosis Date  . Depression   . GERD (gastroesophageal reflux disease)   . Hyperlipidemia   . Breast cancer ([place])     right  . Ovarian cyst   . Parotid tumor     right  . Shingles 2000    Left in V1  . Hx of mitral valve prolapse   . Irregular heart beat   . Restless legs syndrome   . Osteopenia   . Optic neuritis     left  . Pelvic fracture ([place])   . History of GI bleed   . Hiatal hernia   . Anemia     Has had iron infusions  . Depression     Social History   Social History  . Marital Status: Married    Spouse Name:  N/A  . Number of Children: 2  . Years of Education: 16   Occupational History  . Retired Therapist, sports    Social History Main Topics  . Smoking status: Never Smoker   . Smokeless tobacco: Never Used  . Alcohol Use: 0.0 oz/week    0 Standard drinks or equivalent per week     Comment: Rarely,socially  . Drug Use: No  . Sexual Activity: Not on file   Other Topics Concern  . Not on file   Social History Narrative   Retired from nursing    Widowed           Past Surgical History  Procedure Laterality Date  . Appendectomy    . Abdominal hysterectomy    . Spine surgery    . Mastectomy Right   . Breast lumpectomy Left   . Total hip arthroplasty Left   . Hernia repair  08/2009  . Esophageal manometry N/A 01/13/2013    Procedure: ESOPHAGEAL MANOMETRY (EM);  Surgeon: Sable Feil, MD;  Location: [facility];  Service: Endoscopy;  Laterality: N/A;    Family History  Problem Relation Age of Onset  . Cancer Mother  Angio sarcoma   . Heart disease Father   . Hypertension Father   . Stroke Father     x2  . Hernia Brother   . Hypertension Brother   . Heart attack Father     x 11    Allergies  Allergen Reactions  . Diflunisal     REACTION: rash , swelling  anaphlaxsis  . Tetanus Toxoid     REACTION: arm swelling, fever    Current Outpatient Prescriptions on File Prior to Visit  Medication Sig Dispense Refill  . ALPRAZolam (XANAX) 1 MG tablet Take 1 tablet (1 mg total) by mouth at bedtime. 30 tablet 0  . atenolol (TENORMIN) 50 MG tablet Take 50 mg by mouth daily. As needed    . dexlansoprazole (DEXILANT) 60 MG capsule Take 1 capsule (60 mg total) by mouth daily. 90 capsule 2  . HYDROcodone-acetaminophen (NORCO) 7.5-325 MG tablet Take 1 tablet by mouth every 6 (six) hours as needed. 60 tablet 0  . Ibuprofen (ADVIL PO) Take 200 mg by mouth every 4 (four) hours as needed. Take 100mg  every 6 hours as needed    . NITROSTAT 0.4 MG SL tablet PLACE ONE TABLET UNDER THE TONGUE  EVERY 5 MINUTES AS NEEDED. 100 tablet 0  . rOPINIRole (REQUIP) 3 MG tablet TAKE ONE TABLET BY MOUTH AT BEDTIME 90 tablet 1   No current facility-administered medications on file prior to visit.    BP 122/82 mmHg  Temp(Src) 98.2 F (36.8 C) (Oral)  Ht 5\' 7"  (1.702 m)  Wt 173 lb 9.6 oz (78.744 kg)  BMI 27.18 kg/m2       Objective:   Physical Exam  Constitutional: She is oriented to person, place, and time. She appears well-developed and well-nourished. No distress.  No tearful events in the office today. Appears happy   Cardiovascular: Normal rate, regular rhythm, normal heart sounds and intact distal pulses.  Exam reveals no gallop and no friction rub.   No murmur heard. Pulmonary/Chest: Effort normal and breath sounds normal. No respiratory distress. She has no wheezes. She has no rales. She exhibits no tenderness.  Neurological: She is oriented to person, place, and time.  Skin: Skin is warm and dry. She is not diaphoretic.  Psychiatric: She has a normal mood and affect. Her behavior is normal. Judgment and thought content normal.  Nursing note and vitals reviewed.      Assessment & Plan:  1. Depression - Continue with current plan.  - Follow up with me as needed and follow up with Dr. Glennon Hamilton on a regular basis.  - Please let us know if she needs anything over the holiday season.  - buPROPion (WELLBUTRIN SR) 150 MG 12 hr tablet; Take 1 tablet (150 mg total) by mouth 2 (two) times daily.  Dispense: 180 tablet; Refill: 3 - go to the ER with any signs of depression 2. Sleep disturbance - buPROPion (WELLBUTRIN SR) 150 MG 12 hr tablet; Take 1 tablet (150 mg total) by mouth 2 (two) times daily.  Dispense: 180 tablet; Refill: 3

## 2015-03-19 NOTE — Progress Notes (Signed)
Pre visit review using our clinic review tool, if applicable. No additional management support is needed unless otherwise documented below in the visit note. 

## 2015-03-23 ENCOUNTER — Ambulatory Visit (INDEPENDENT_AMBULATORY_CARE_PROVIDER_SITE_OTHER): Payer: Medicare Other | Admitting: Psychology

## 2015-03-23 DIAGNOSIS — F331 Major depressive disorder, recurrent, moderate: Secondary | ICD-10-CM

## 2015-04-07 ENCOUNTER — Ambulatory Visit (INDEPENDENT_AMBULATORY_CARE_PROVIDER_SITE_OTHER): Payer: Medicare Other | Admitting: Psychology

## 2015-04-07 DIAGNOSIS — F331 Major depressive disorder, recurrent, moderate: Secondary | ICD-10-CM | POA: Diagnosis not present

## 2015-04-19 ENCOUNTER — Ambulatory Visit (INDEPENDENT_AMBULATORY_CARE_PROVIDER_SITE_OTHER): Payer: Medicare Other | Admitting: Psychology

## 2015-04-19 DIAGNOSIS — F331 Major depressive disorder, recurrent, moderate: Secondary | ICD-10-CM | POA: Diagnosis not present

## 2015-05-10 ENCOUNTER — Ambulatory Visit: Payer: Medicare Other | Admitting: Psychology

## 2015-06-23 ENCOUNTER — Other Ambulatory Visit: Payer: Self-pay | Admitting: Adult Health

## 2015-06-23 NOTE — Telephone Encounter (Signed)
OK to refill for one month 

## 2015-06-24 ENCOUNTER — Ambulatory Visit (INDEPENDENT_AMBULATORY_CARE_PROVIDER_SITE_OTHER): Payer: Medicare Other | Admitting: Psychology

## 2015-06-24 DIAGNOSIS — F330 Major depressive disorder, recurrent, mild: Secondary | ICD-10-CM | POA: Diagnosis not present

## 2015-07-22 ENCOUNTER — Ambulatory Visit (INDEPENDENT_AMBULATORY_CARE_PROVIDER_SITE_OTHER): Payer: Medicare Other | Admitting: Psychology

## 2015-07-22 DIAGNOSIS — F330 Major depressive disorder, recurrent, mild: Secondary | ICD-10-CM | POA: Diagnosis not present

## 2015-08-06 ENCOUNTER — Other Ambulatory Visit: Payer: Self-pay | Admitting: Adult Health

## 2015-08-06 ENCOUNTER — Other Ambulatory Visit: Payer: Self-pay | Admitting: Neurology

## 2015-08-06 ENCOUNTER — Other Ambulatory Visit: Payer: Self-pay | Admitting: Family Medicine

## 2015-08-06 NOTE — Telephone Encounter (Signed)
Please have pt call for 6 mo appointment.

## 2015-08-06 NOTE — Telephone Encounter (Signed)
Xanax last refilled 06/23/15 for #30 with 0 refills. Last office visit was 03/19/15. Ok to refill this medication?

## 2015-08-06 NOTE — Telephone Encounter (Signed)
Ok to refill for one month  

## 2015-08-19 ENCOUNTER — Ambulatory Visit: Payer: Medicare Other | Admitting: Psychology

## 2015-08-19 ENCOUNTER — Ambulatory Visit (INDEPENDENT_AMBULATORY_CARE_PROVIDER_SITE_OTHER): Payer: Medicare Other | Admitting: Psychology

## 2015-08-19 ENCOUNTER — Telehealth: Payer: Self-pay | Admitting: Neurology

## 2015-08-19 ENCOUNTER — Telehealth: Payer: Self-pay | Admitting: Adult Health

## 2015-08-19 DIAGNOSIS — F331 Major depressive disorder, recurrent, moderate: Secondary | ICD-10-CM | POA: Diagnosis not present

## 2015-08-19 NOTE — Telephone Encounter (Signed)
Pt called request refill for HYDROcodone-acetaminophen (NORCO) 7.5-325 MG tablet . Please send RX with Liane Comber RN

## 2015-08-20 DIAGNOSIS — M7061 Trochanteric bursitis, right hip: Secondary | ICD-10-CM | POA: Diagnosis not present

## 2015-08-20 DIAGNOSIS — M25551 Pain in right hip: Secondary | ICD-10-CM | POA: Diagnosis not present

## 2015-08-20 NOTE — Telephone Encounter (Signed)
error 

## 2015-08-23 ENCOUNTER — Telehealth: Payer: Self-pay

## 2015-08-23 MED ORDER — HYDROCODONE-ACETAMINOPHEN 7.5-325 MG PO TABS: 1.0000 | ORAL_TABLET | Freq: Four times a day (QID) | ORAL | Status: DC | PRN

## 2015-08-23 NOTE — Telephone Encounter (Signed)
Rx printed, signed, ready for pick-up. F/u appt scheduled for 09/02/15.

## 2015-08-23 NOTE — Telephone Encounter (Signed)
The hydrocodone was refilled. The patient will need a RV scheduled.

## 2015-08-23 NOTE — Telephone Encounter (Signed)
-----   Message from Kathrynn Ducking, MD sent at 08/23/2015  7:36 AM EDT ----- This patient needs a RV, OK to see Megan.

## 2015-08-23 NOTE — Telephone Encounter (Signed)
Called and spoke to pt. Appt scheduled May 4th @ 12 w/ Dr. Jannifer Franklin.

## 2015-09-02 ENCOUNTER — Ambulatory Visit (INDEPENDENT_AMBULATORY_CARE_PROVIDER_SITE_OTHER): Payer: Medicare Other | Admitting: Neurology

## 2015-09-02 ENCOUNTER — Encounter: Payer: Self-pay | Admitting: Neurology

## 2015-09-02 VITALS — BP 128/84 | HR 76 | Ht 67.5 in | Wt 177.5 lb

## 2015-09-02 DIAGNOSIS — G2581 Restless legs syndrome: Secondary | ICD-10-CM | POA: Diagnosis not present

## 2015-09-02 DIAGNOSIS — G4762 Sleep related leg cramps: Secondary | ICD-10-CM | POA: Insufficient documentation

## 2015-09-02 HISTORY — DX: Sleep related leg cramps: G47.62

## 2015-09-02 MED ORDER — ROPINIROLE HCL 1 MG PO TABS: ORAL_TABLET | ORAL | Status: DC

## 2015-09-02 NOTE — Progress Notes (Addendum)
Reason for visit: Restless leg syndrome  Shelley Coleman is an 75 y.o. female  History of present illness:  Shelley Coleman is a 74 year old right-handed white female with a history of restless leg syndrome. The patient in the past has responded well to IV iron therapies. The patient is on Requip taking 3 mg at night, this is not fully effective for her. She is also now having some nocturnal leg cramps that keep her awake. The patient is suffering from ongoing depression following the death of her husband almost a year and a half ago. She has recently had an episode of bursitis affecting the right hip that responded well to a steroid injection. The patient is trying to stay active otherwise. She reports some mild gait instability, slight weakness of the right leg, she will stumble on occasion. She does have a mild reported memory issue, but this has been relatively stable. She lives alone at this time, she does have a son and the daughter that live in this area.  Past Medical History  Diagnosis Date  . Depression   . GERD (gastroesophageal reflux disease)   . Hyperlipidemia   . Breast cancer ([place])     right  . Ovarian cyst   . Parotid tumor     right  . Shingles 2000    Left in V1  . Hx of mitral valve prolapse   . Irregular heart beat   . Restless legs syndrome   . Osteopenia   . Optic neuritis     left  . Pelvic fracture ([place])   . History of GI bleed   . Hiatal hernia   . Anemia     Has had iron infusions  . Depression   . Nocturnal leg cramps 09/02/2015    Past Surgical History  Procedure Laterality Date  . Appendectomy    . Abdominal hysterectomy    . Spine surgery    . Mastectomy Right   . Breast lumpectomy Left   . Total hip arthroplasty Left   . Hernia repair  08/2009  . Esophageal manometry N/A 01/13/2013    Procedure: ESOPHAGEAL MANOMETRY (EM);  Surgeon: Sable Feil, MD;  Location: [facility];  Service: Endoscopy;  Laterality: N/A;  . Cataract extraction       Family History  Problem Relation Age of Onset  . Cancer Mother     Angio sarcoma   . Heart disease Father   . Hypertension Father   . Stroke Father     x2  . Hernia Brother   . Hypertension Brother   . Heart attack Father     x 11    Social history:  reports that she has never smoked. She has never used smokeless tobacco. She reports that she drinks alcohol. She reports that she does not use illicit drugs.    Allergies  Allergen Reactions  . Diflunisal     REACTION: rash , swelling  anaphlaxsis  . Tetanus Toxoid     REACTION: arm swelling, fever    Medications:  Prior to Admission medications   Medication Sig Start Date End Date Taking? Authorizing Provider  ALPRAZolam Duanne Moron) 1 MG tablet TAKE ONE TABLET BY MOUTH AT BEDTIME 08/06/15  Yes Shelley Peng, NP  atenolol (TENORMIN) 50 MG tablet Take 50 mg by mouth daily. As needed 06/27/11  Yes Dorena Cookey, MD  buPROPion [facility]) 150 MG 12 hr tablet Take 1 tablet (150 mg total) by mouth 2 (two) times  daily. 03/19/15  Yes Shelley Peng, NP  DEXILANT 60 MG capsule TAKE ONE CAPSULE BY MOUTH ONCE DAILY. 08/06/15  Yes Shelley Peng, NP  HYDROcodone-acetaminophen (NORCO) 7.5-325 MG tablet Take 1 tablet by mouth every 6 (six) hours as needed. 08/23/15  Yes Kathrynn Ducking, MD  Ibuprofen (ADVIL PO) Take 200 mg by mouth every 4 (four) hours as needed. Take 100mg  every 6 hours as needed   Yes Historical Provider, MD  MELATONIN PO Take by mouth at bedtime as needed.   Yes Historical Provider, MD  Multiple Vitamin (MULTIVITAMIN) tablet Take by mouth.   Yes Historical Provider, MD  NITROSTAT 0.4 MG SL tablet PLACE ONE TABLET UNDER THE TONGUE EVERY 5 MINUTES AS NEEDED. 11/16/14  Yes Dorena Cookey, MD  rOPINIRole (REQUIP) 3 MG tablet TAKE ONE TABLET BY MOUTH ONCE DAILY AT BEDTIME 08/06/15  Yes Kathrynn Ducking, MD    ROS:  Out of a complete 14 system review of symptoms, the patient complains only of the following symptoms, and all other  reviewed systems are negative.  Decreased appetite, fatigue Hearing loss, ringing in the ears Heart murmur Restless legs, insomnia, frequent waking, daytime sleepiness Joint pain, back pain, achy muscles, muscle cramps, walking difficulty Bruising easily Decreased concentration, depression  Blood pressure 128/84, pulse 76, height 5' 7.5" (1.715 m), weight 177 lb 8 oz (80.513 kg).  Physical Exam  General: The patient is alert and cooperative at the time of the examination.  Skin: No significant peripheral edema is noted.   Neurologic Exam  Mental status: The patient is alert and oriented x 3 at the time of the examination. The patient has apparent normal recent and remote memory, with an apparently normal attention span and concentration ability. Mini-Mental Status Examination done today shows a total score 29/30.   Cranial nerves: Facial symmetry is present. Speech is normal, no aphasia or dysarthria is noted. Extraocular movements are full. Visual fields are full.  Motor: The patient has good strength in all 4 extremities.  Sensory examination: Soft touch sensation is symmetric on the face, arms, and legs.  Coordination: The patient has good finger-nose-finger and heel-to-shin bilaterally.  Gait and station: The patient has a normal gait. Tandem gait is minimally unsteady. Romberg is negative. No drift is seen.  Reflexes: Deep tendon reflexes are symmetric.   Assessment/Plan:  1. Restless leg syndrome  2. Mild memory disturbance  3. Mild gait disturbance  4. Nocturnal leg cramps  The patient will try magnesium supplementation in the evening hours for the nocturnal leg cramps, if this is not effective, she will contact our office and we will consider a prescription for baclofen. The patient will be increased on Requip slightly taking 1 mg at 5 PM, and 4 mg in the evening. If this is not effective, IV Iron therapy can be used in the future. The patient will follow-up in  6 months, sooner if needed. The memory issues will be followed. The patient is having ongoing symptoms of depression.  Jill Alexanders MD 09/02/2015 8:01 PM  [facility] [address] [place], [place] [address]  Phone [phone] Fax [phone]

## 2015-09-02 NOTE — Patient Instructions (Addendum)
   Try magnesium oxide 250 mg at night for the muscle cramps. If this is not effective, call our office. We will increase the requip to 1 mg at 5 pm and 4 mg at night.   Restless Legs Syndrome Restless legs syndrome is a condition that causes uncomfortable feelings or sensations in the legs, especially while sitting or lying down. The sensations usually cause an overwhelming urge to move the legs. The arms can also sometimes be affected. The condition can range from mild to severe. The symptoms often interfere with a person's ability to sleep. CAUSES The cause of this condition is not known. RISK FACTORS This condition is more likely to develop in:  People who are older than age 13.  Pregnant women. In general, restless legs syndrome is more common in women than in men.  People who have a family history of the condition.  People who have certain medical conditions, such as iron deficiency, kidney disease, Parkinson disease, or nerve damage.  People who take certain medicines, such as medicines for high blood pressure, nausea, colds, allergies, depression, and some heart conditions. SYMPTOMS The main symptom of this condition is uncomfortable sensations in the legs. These sensations may be:  Described as pulling, tingling, prickling, throbbing, crawling, or burning.  Worse while you are sitting or lying down.  Worse during periods of rest or inactivity.  Worse at night, often interfering with your sleep.  Accompanied by a very strong urge to move your legs.  Temporarily relieved by movement of your legs. The sensations usually affect both sides of the body. The arms can also be affected, but this is rare. People who have this condition often have tiredness during the day because of their lack of sleep at night. DIAGNOSIS This condition may be diagnosed based on your description of the symptoms. You may also have tests, including blood tests, to check for other conditions that may  lead to your symptoms. In some cases, you may be asked to spend some time in a sleep lab so your sleeping can be monitored. TREATMENT Treatment for this condition is focused on managing the symptoms. Treatment may include:  Self-help and lifestyle changes.  Medicines. HOME CARE INSTRUCTIONS  Take medicines only as directed by your health care provider.  Try these methods to get temporary relief from the uncomfortable sensations:  Massage your legs.  Walk or stretch.  Take a cold or hot bath.  Practice good sleep habits. For example, go to bed and get up at the same time every day.  Exercise regularly.  Practice ways of relaxing, such as yoga or meditation.  Avoid caffeine and alcohol.  Do not use any tobacco products, including cigarettes, chewing tobacco, or electronic cigarettes. If you need help quitting, ask your health care provider.  Keep all follow-up visits as directed by your health care provider. This is important. SEEK MEDICAL CARE IF: Your symptoms do not improve with treatment, or they get worse.   This information is not intended to replace advice given to you by your health care provider. Make sure you discuss any questions you have with your health care provider.   Document Released: 04/07/2002 Document Revised: 09/01/2014 Document Reviewed: 04/13/2014 Elsevier Interactive Patient Education [employer].

## 2015-09-16 ENCOUNTER — Ambulatory Visit: Payer: Medicare Other | Admitting: Psychology

## 2015-10-06 ENCOUNTER — Ambulatory Visit (INDEPENDENT_AMBULATORY_CARE_PROVIDER_SITE_OTHER): Payer: Medicare Other | Admitting: Psychology

## 2015-10-06 DIAGNOSIS — F331 Major depressive disorder, recurrent, moderate: Secondary | ICD-10-CM

## 2015-10-21 ENCOUNTER — Telehealth: Payer: Self-pay | Admitting: Adult Health

## 2015-10-21 NOTE — Telephone Encounter (Signed)
Pt needs referral from [facility] to see [facility] [place] [phone]   Pt got a notice for her yearly exam, (pt previous breast cancer pt.) When pt called to make her appointment, she was told her pcp needs to order the referral.

## 2015-10-22 ENCOUNTER — Other Ambulatory Visit: Payer: Self-pay | Admitting: Adult Health

## 2015-10-22 DIAGNOSIS — Z1239 Encounter for other screening for malignant neoplasm of breast: Secondary | ICD-10-CM

## 2015-10-22 NOTE — Telephone Encounter (Signed)
Referral placed.

## 2015-10-25 ENCOUNTER — Ambulatory Visit (INDEPENDENT_AMBULATORY_CARE_PROVIDER_SITE_OTHER): Payer: Medicare Other | Admitting: Psychology

## 2015-10-25 DIAGNOSIS — F331 Major depressive disorder, recurrent, moderate: Secondary | ICD-10-CM

## 2015-11-01 ENCOUNTER — Observation Stay (HOSPITAL_COMMUNITY): Payer: Medicare Other

## 2015-11-01 ENCOUNTER — Emergency Department (HOSPITAL_COMMUNITY): Payer: Medicare Other

## 2015-11-01 ENCOUNTER — Inpatient Hospital Stay (HOSPITAL_COMMUNITY): Payer: Medicare Other

## 2015-11-01 ENCOUNTER — Inpatient Hospital Stay (HOSPITAL_BASED_OUTPATIENT_CLINIC_OR_DEPARTMENT_OTHER): Payer: Medicare Other

## 2015-11-01 ENCOUNTER — Observation Stay (HOSPITAL_COMMUNITY)
Admission: EM | Admit: 2015-11-01 | Discharge: 2015-11-02 | Disposition: A | Discharge status: Home / Self Care | Payer: Medicare Other | Attending: Internal Medicine | Admitting: Internal Medicine

## 2015-11-01 DIAGNOSIS — F329 Major depressive disorder, single episode, unspecified: Secondary | ICD-10-CM | POA: Diagnosis not present

## 2015-11-01 DIAGNOSIS — G43109 Migraine with aura, not intractable, without status migrainosus: Secondary | ICD-10-CM | POA: Insufficient documentation

## 2015-11-01 DIAGNOSIS — K219 Gastro-esophageal reflux disease without esophagitis: Secondary | ICD-10-CM | POA: Diagnosis not present

## 2015-11-01 DIAGNOSIS — Z8673 Personal history of transient ischemic attack (TIA), and cerebral infarction without residual deficits: Secondary | ICD-10-CM | POA: Insufficient documentation

## 2015-11-01 DIAGNOSIS — I6789 Other cerebrovascular disease: Secondary | ICD-10-CM

## 2015-11-01 DIAGNOSIS — G459 Transient cerebral ischemic attack, unspecified: Secondary | ICD-10-CM

## 2015-11-01 DIAGNOSIS — I499 Cardiac arrhythmia, unspecified: Secondary | ICD-10-CM | POA: Insufficient documentation

## 2015-11-01 DIAGNOSIS — Z853 Personal history of malignant neoplasm of breast: Secondary | ICD-10-CM | POA: Insufficient documentation

## 2015-11-01 DIAGNOSIS — K449 Diaphragmatic hernia without obstruction or gangrene: Secondary | ICD-10-CM | POA: Insufficient documentation

## 2015-11-01 DIAGNOSIS — I341 Nonrheumatic mitral (valve) prolapse: Secondary | ICD-10-CM | POA: Diagnosis not present

## 2015-11-01 DIAGNOSIS — M62838 Other muscle spasm: Secondary | ICD-10-CM | POA: Diagnosis not present

## 2015-11-01 DIAGNOSIS — I10 Essential (primary) hypertension: Secondary | ICD-10-CM | POA: Insufficient documentation

## 2015-11-01 DIAGNOSIS — G629 Polyneuropathy, unspecified: Secondary | ICD-10-CM | POA: Insufficient documentation

## 2015-11-01 DIAGNOSIS — M8580 Other specified disorders of bone density and structure, unspecified site: Secondary | ICD-10-CM | POA: Diagnosis not present

## 2015-11-01 DIAGNOSIS — R262 Difficulty in walking, not elsewhere classified: Secondary | ICD-10-CM | POA: Insufficient documentation

## 2015-11-01 DIAGNOSIS — I7389 Other specified peripheral vascular diseases: Secondary | ICD-10-CM | POA: Diagnosis not present

## 2015-11-01 DIAGNOSIS — R531 Weakness: Secondary | ICD-10-CM | POA: Diagnosis not present

## 2015-11-01 DIAGNOSIS — R202 Paresthesia of skin: Principal | ICD-10-CM | POA: Insufficient documentation

## 2015-11-01 DIAGNOSIS — I6523 Occlusion and stenosis of bilateral carotid arteries: Secondary | ICD-10-CM | POA: Diagnosis not present

## 2015-11-01 DIAGNOSIS — G4762 Sleep related leg cramps: Secondary | ICD-10-CM | POA: Diagnosis not present

## 2015-11-01 DIAGNOSIS — H469 Unspecified optic neuritis: Secondary | ICD-10-CM | POA: Diagnosis not present

## 2015-11-01 DIAGNOSIS — Q2546 Tortuous aortic arch: Secondary | ICD-10-CM | POA: Insufficient documentation

## 2015-11-01 DIAGNOSIS — I639 Cerebral infarction, unspecified: Secondary | ICD-10-CM | POA: Diagnosis present

## 2015-11-01 DIAGNOSIS — F411 Generalized anxiety disorder: Secondary | ICD-10-CM | POA: Insufficient documentation

## 2015-11-01 DIAGNOSIS — G479 Sleep disorder, unspecified: Secondary | ICD-10-CM | POA: Insufficient documentation

## 2015-11-01 DIAGNOSIS — Z888 Allergy status to other drugs, medicaments and biological substances status: Secondary | ICD-10-CM | POA: Insufficient documentation

## 2015-11-01 DIAGNOSIS — G2581 Restless legs syndrome: Secondary | ICD-10-CM | POA: Diagnosis not present

## 2015-11-01 DIAGNOSIS — D649 Anemia, unspecified: Secondary | ICD-10-CM | POA: Insufficient documentation

## 2015-11-01 DIAGNOSIS — R079 Chest pain, unspecified: Secondary | ICD-10-CM | POA: Diagnosis not present

## 2015-11-01 DIAGNOSIS — R29818 Other symptoms and signs involving the nervous system: Secondary | ICD-10-CM | POA: Diagnosis not present

## 2015-11-01 DIAGNOSIS — Z887 Allergy status to serum and vaccine status: Secondary | ICD-10-CM | POA: Insufficient documentation

## 2015-11-01 DIAGNOSIS — R200 Anesthesia of skin: Secondary | ICD-10-CM | POA: Insufficient documentation

## 2015-11-01 DIAGNOSIS — E785 Hyperlipidemia, unspecified: Secondary | ICD-10-CM | POA: Insufficient documentation

## 2015-11-01 DIAGNOSIS — Z7982 Long term (current) use of aspirin: Secondary | ICD-10-CM | POA: Insufficient documentation

## 2015-11-01 LAB — COMPREHENSIVE METABOLIC PANEL
ALBUMIN: 4.1 g/dL (ref 3.5–5.0)
ALK PHOS: 74 U/L (ref 38–126)
ALT: 18 U/L (ref 14–54)
AST: 22 U/L (ref 15–41)
Anion gap: 8 (ref 5–15)
BILIRUBIN TOTAL: 0.2 mg/dL — AB (ref 0.3–1.2)
BUN: 22 mg/dL — AB (ref 6–20)
CALCIUM: 9 mg/dL (ref 8.9–10.3)
CO2: 24 mmol/L (ref 22–32)
Chloride: 107 mmol/L (ref 101–111)
Creatinine, Ser: 0.61 mg/dL (ref 0.44–1.00)
GFR calc Af Amer: >60 mL/min (ref >60)
GLUCOSE: 99 mg/dL (ref 65–99)
Potassium: 3.6 mmol/L (ref 3.5–5.1)
Sodium: 139 mmol/L (ref 135–145)
TOTAL PROTEIN: 6.8 g/dL (ref 6.5–8.1)

## 2015-11-01 LAB — LIPID PANEL
CHOL/HDL RATIO: 4 ratio
Cholesterol: 247 mg/dL — ABNORMAL HIGH (ref 0–200)
HDL: 61 mg/dL (ref >40)
LDL CALC: 163 mg/dL — AB (ref 0–99)
Triglycerides: 114 mg/dL (ref <150)
VLDL: 23 mg/dL (ref 0–40)

## 2015-11-01 LAB — DIFFERENTIAL
Basophils Absolute: 0.1 10*3/uL (ref 0.0–0.1)
Basophils Relative: 1 %
EOS ABS: 0.2 10*3/uL (ref 0.0–0.7)
EOS PCT: 2 %
LYMPHS ABS: 1.8 10*3/uL (ref 0.7–4.0)
LYMPHS PCT: 19 %
MONOS PCT: 7 %
Monocytes Absolute: 0.6 10*3/uL (ref 0.1–1.0)
NEUTROS PCT: 71 %
Neutro Abs: 6.6 10*3/uL (ref 1.7–7.7)

## 2015-11-01 LAB — CBC
HEMATOCRIT: 38.5 % (ref 36.0–46.0)
HEMOGLOBIN: 12.8 g/dL (ref 12.0–15.0)
MCH: 29.8 pg (ref 26.0–34.0)
MCHC: 33.2 g/dL (ref 30.0–36.0)
MCV: 89.5 fL (ref 78.0–100.0)
Platelets: 224 10*3/uL (ref 150–400)
RBC: 4.3 MIL/uL (ref 3.87–5.11)
RDW: 13.9 % (ref 11.5–15.5)
WBC: 9.2 10*3/uL (ref 4.0–10.5)

## 2015-11-01 LAB — ECHOCARDIOGRAM COMPLETE
AVLVOTPG: 10 mmHg
E decel time: 187 msec
EERAT: 11.98
FS: 47 % — AB (ref 28–44)
IV/PV OW: 1.24
LA diam end sys: 36 mm
LA diam index: 1.82 cm/m2
LA vol A4C: 54.5 ml
LASIZE: 36 mm
LAVOL: 58.4 mL
LAVOLIN: 29.6 mL/m2
LDCA: 3.14 cm2
LV TDI E'LATERAL: 7.29
LV TDI E'MEDIAL: 8.38
LV e' LATERAL: 7.29 cm/s
LVEEAVG: 11.98
LVEEMED: 11.98
LVOT VTI: 42.5 cm
LVOT diameter: 20 mm
LVOTPV: 155 cm/s
LVOTSV: 133 mL
MV Dec: 187
MV pk A vel: 65.5 m/s
MV pk E vel: 87.3 m/s
MVPG: 3 mmHg
PW: 8.96 mm — AB (ref 0.6–1.1)

## 2015-11-01 LAB — CBG MONITORING, ED: GLUCOSE-CAPILLARY: 103 mg/dL — AB (ref 65–99)

## 2015-11-01 LAB — PROTIME-INR
INR: 1.09 (ref 0.00–1.49)
Prothrombin Time: 13.8 seconds (ref 11.6–15.2)

## 2015-11-01 LAB — TSH: TSH: 4.163 u[IU]/mL (ref 0.350–4.500)

## 2015-11-01 LAB — APTT: aPTT: 26 seconds (ref 24–37)

## 2015-11-01 MED ORDER — ROPINIROLE HCL 1 MG PO TABS
3.0000 mg | ORAL_TABLET | Freq: Every day | ORAL | Status: DC
  Administered 2015-11-01: 3 mg via ORAL
  Filled 2015-11-01: qty 3

## 2015-11-01 MED ORDER — ASPIRIN 325 MG PO TABS
325.0000 mg | ORAL_TABLET | Freq: Every day | ORAL | Status: DC
  Administered 2015-11-01 – 2015-11-02 (×2): 325 mg via ORAL
  Filled 2015-11-01 (×2): qty 1

## 2015-11-01 MED ORDER — BUPROPION HCL ER (SR) 150 MG PO TB12
150.0000 mg | ORAL_TABLET | Freq: Two times a day (BID) | ORAL | Status: DC
  Administered 2015-11-01 – 2015-11-02 (×3): 150 mg via ORAL
  Filled 2015-11-01 (×3): qty 1

## 2015-11-01 MED ORDER — PANTOPRAZOLE SODIUM 40 MG PO TBEC
40.0000 mg | DELAYED_RELEASE_TABLET | Freq: Every day | ORAL | Status: DC
  Administered 2015-11-01 – 2015-11-02 (×2): 40 mg via ORAL
  Filled 2015-11-01 (×2): qty 1

## 2015-11-01 MED ORDER — ATORVASTATIN CALCIUM 10 MG PO TABS
20.0000 mg | ORAL_TABLET | Freq: Every day | ORAL | Status: DC
  Administered 2015-11-01: 20 mg via ORAL
  Filled 2015-11-01: qty 2

## 2015-11-01 MED ORDER — HYDROCODONE-ACETAMINOPHEN 7.5-325 MG PO TABS
1.0000 | ORAL_TABLET | Freq: Four times a day (QID) | ORAL | Status: DC | PRN
  Administered 2015-11-01 – 2015-11-02 (×3): 1 via ORAL
  Filled 2015-11-01 (×3): qty 1

## 2015-11-01 MED ORDER — STROKE: EARLY STAGES OF RECOVERY BOOK
Freq: Once | Status: AC
  Administered 2015-11-01: 09:00:00
  Filled 2015-11-01: qty 1

## 2015-11-01 MED ORDER — ALPRAZOLAM 0.25 MG PO TABS
1.0000 mg | ORAL_TABLET | Freq: Every day | ORAL | Status: DC
  Administered 2015-11-01: 1 mg via ORAL
  Filled 2015-11-01: qty 4

## 2015-11-01 MED ORDER — ATORVASTATIN CALCIUM 10 MG PO TABS: 10.0000 mg | ORAL_TABLET | Freq: Every day | ORAL | Status: DC

## 2015-11-01 MED ORDER — STROKE: EARLY STAGES OF RECOVERY BOOK
Freq: Once | Status: AC
  Administered 2015-11-01: 1
  Filled 2015-11-01: qty 1

## 2015-11-01 MED ORDER — ENOXAPARIN SODIUM 40 MG/0.4ML ~~LOC~~ SOLN
40.0000 mg | SUBCUTANEOUS | Status: DC
  Administered 2015-11-01 – 2015-11-02 (×2): 40 mg via SUBCUTANEOUS
  Filled 2015-11-01 (×2): qty 0.4

## 2015-11-01 MED ORDER — IBUPROFEN 200 MG PO TABS
200.0000 mg | ORAL_TABLET | Freq: Four times a day (QID) | ORAL | Status: DC | PRN
  Administered 2015-11-01: 200 mg via ORAL
  Filled 2015-11-01: qty 1

## 2015-11-01 MED ORDER — ROPINIROLE HCL 1 MG PO TABS
1.0000 mg | ORAL_TABLET | ORAL | Status: DC
  Administered 2015-11-01: 1 mg via ORAL
  Filled 2015-11-01: qty 1

## 2015-11-01 MED ORDER — ATENOLOL 50 MG PO TABS
50.0000 mg | ORAL_TABLET | Freq: Every day | ORAL | Status: DC | PRN
  Filled 2015-11-01: qty 1

## 2015-11-01 MED FILL — Ondansetron HCl Inj 4 MG/2ML (2 MG/ML): INTRAMUSCULAR | Qty: 2 | Status: AC

## 2015-11-01 NOTE — Progress Notes (Signed)
  Echocardiogram 2D Echocardiogram has been performed.  Jennette Dubin 11/01/2015, 2:05 PM

## 2015-11-01 NOTE — Care Management Note (Addendum)
Case Management Note  Patient Details  Name: Shelley Coleman MRN: [id number] Date of Birth: 1940/05/28  Subjective/Objective:   Pt in with r/o CVA. MRI results negative. She is from home alone.                  Action/Plan: Awaiting PT/OT recommendations. CM following for discharge needs.   Expected Discharge Date:                  Expected Discharge Plan:     In-House Referral:     Discharge planning Services     Post Acute Care Choice:    Choice offered to:     DME Arranged:    DME Agency:     HH Arranged:    HH Agency:     Status of Service:  In process, will continue to follow  If discussed at Long Length of Stay Meetings, dates discussed:    Additional Comments:  Pollie Friar, RN 11/01/2015, 1:51 PM

## 2015-11-01 NOTE — Progress Notes (Signed)
EEG completed, results pending. 

## 2015-11-01 NOTE — Progress Notes (Signed)
STROKE TEAM PROGRESS NOTE   HISTORY OF PRESENT ILLNESS (per record) Shelley Coleman is an 75 y.o. female with a history of hyperlipidemia, breast cancer, peripheral neuropathy, anemia and restless leg syndrome, brought to the emergency room following acute onset of numbness involving right upper extremity and distal lower extremity as well as difficulty with being able to control lower extremities with walking. She has no previous history of stroke nor TIA. She has not been on antiplatelet therapy on a routine basis. CT scan of her head showed no acute intracranial abnormality. Old left basal ganglia infarct was noted. Numbness has persisted, involving right upper and lower extremities. She had no facial droop and no apparent change in speech. NIH stroke score was 1. She was LKW at 1:00 AM on 10/02/2015. Patient was not administered IV t-PA secondary to minimal deficits. She was admitted for further evaluation and treatment.   SUBJECTIVE (INTERVAL HISTORY) Her daughter is at the bedside.  Overall she feels her condition is stable. She still complains of right hand, wrist and right foot tingling comes and goes. She admitted that she has bad headache today after the tingling getting worse, although HA getting better now. She denies hx of migraine. Her husband passed away one year ago, she has been in grief since then, went through counseling and on wellbutrine. Doing better. She was RN in ophthalmology.    OBJECTIVE Temp:  [98 F (36.7 C)-98.1 F (36.7 C)] 98 F (36.7 C) (07/03 0746) Pulse Rate:  [77-95] 78 (07/03 0746) Cardiac Rhythm:  [-] Normal sinus rhythm (07/03 0558) Resp:  [11-23] 16 (07/03 0746) BP: (134-171)/(75-94) 150/87 mmHg (07/03 0746) SpO2:  [96 %-100 %] 100 % (07/03 0746)  CBC:  Recent Labs Lab 11/01/15 0340  WBC 9.2  NEUTROABS 6.6  HGB 12.8  HCT 38.5  MCV 89.5  PLT [id number]    Basic Metabolic Panel:  Recent Labs Lab 11/01/15 0340  NA 139  K 3.6  CL 107  CO2 24   GLUCOSE 99  BUN 22*  CREATININE 0.61  CALCIUM 9.0    Lipid Panel:    Component Value Date/Time   CHOL 247* 11/01/2015 0508   TRIG 114 11/01/2015 0508   HDL 61 11/01/2015 0508   CHOLHDL 4.0 11/01/2015 0508   VLDL 23 11/01/2015 0508   LDLCALC 163* 11/01/2015 0508   HgbA1c: No results found for: HGBA1C Urine Drug Screen: No results found for: LABOPIA, COCAINSCRNUR, LABBENZ, AMPHETMU, THCU, LABBARB    IMAGING I have personally reviewed the radiological images below and agree with the radiology interpretations.  Ct Head Code Stroke W/o Cm  11/01/2015  IMPRESSION: Remote lacunar infarct in the left basal ganglia. No hemorrhage or CT findings of acute ischemia.  Dg Chest 2 View  11/01/2015  IMPRESSION: Chronic bronchitic type interstitial lung changes but no acute overlying pulmonary process.  Mri and Mra Brain Wo Contrast  11/01/2015  IMPRESSION: 1. Evidence of chronic small vessel disease but no acute infarct or acute intracranial abnormality identified. 2. Evidence of ICA siphon atherosclerosis but no associated stenosis, and otherwise negative intracranial MRA.    TTE - - Left ventricle: The cavity size was normal. There was mild focal  basal hypertrophy of the septum. Systolic function was normal.  The estimated ejection fraction was in the range of 55% to 60%.  Wall motion was normal; there were no regional wall motion  abnormalities. Doppler parameters are consistent with abnormal  left ventricular relaxation (grade 1 diastolic dysfunction). Impressions: -  Normal LV systolic function; grade 1 diastolic dysfunction;  trace TR.  CUS - pending  EEG - pending   PHYSICAL EXAM Physical exam  Temp:  [97.7 F (36.5 C)-98.1 F (36.7 C)] 97.9 F (36.6 C) (07/03 1517) Pulse Rate:  [71-95] 71 (07/03 1517) Resp:  [11-23] 16 (07/03 1130) BP: (107-171)/(57-94) 127/70 mmHg (07/03 1517) SpO2:  [96 %-100 %] 98 % (07/03 1517)  General - Well nourished, well developed, in  no apparent distress.  Ophthalmologic - Fundi not visualized due to non cooperation.  Cardiovascular - Regular rate and rhythm.  Mental Status -  Level of arousal and orientation to time, place, and person were intact. Language including expression, naming, repetition, comprehension was assessed and found intact. Fund of Knowledge was assessed and was intact.  Cranial Nerves II - XII - II - Visual field intact OU. III, IV, VI - Extraocular movements intact. V - Facial sensation intact bilaterally. VII - Facial movement intact bilaterally. VIII - Hearing & vestibular intact bilaterally. X - Palate elevates symmetrically. XI - Chin turning & shoulder shrug intact bilaterally. XII - Tongue protrusion intact.  Motor Strength - The patient's strength was normal in all extremities and pronator drift was absent.  Bulk was normal and fasciculations were absent.   Motor Tone - Muscle tone was assessed at the neck and appendages and was normal.  Reflexes - The patient's reflexes were 1+ in all extremities and she had no pathological reflexes.  Sensory - Light touch, temperature/pinprick were symmetrical but subjectively tingling at right wrist, fingers and right leg and foot.    Coordination - The patient had normal movements in the hands and feet with no ataxia or dysmetria.  Tremor was absent.  Gait and Station - deferred   ASSESSMENT/PLAN Shelley Coleman is a 75 y.o. female with history of hyperlipidemia, breast cancer, peripheral neuropathy, anemia and restless leg syndrome presenting with RUE and distal RLE numbness and difficulty walking. She did not receive IV t-PA due to minimal deficits.   Right hand and leg tingling - could be related to HA or anxiety/depression, NO acute infarct on MRI so far although pt does have stroke risk factors with HLD and old right caudate head infarct on CT/MRI. Low suspicious for seizure.  Resultant  Subjective right hand/leg tingling  MRI  No  acute infarct  MRA  ICA siphon atherosclerosis but no significant stenosis  Carotid Doppler  pending  2D Echo  EF 55-60%  EEG pending  LDL 163  HgbA1c pending  Lovenox 40 mg sq daily for VTE prophylaxis  Diet Heart Room service appropriate?: Yes; Fluid consistency:: Thin  No antithrombotic prior to admission, now on aspirin 325 mg daily.   Patient counseled to be compliant with her antithrombotic medications  Ongoing aggressive stroke risk factor management  Therapy recommendations:  pending  Disposition:  pending  Hypertensive  No hx HTN  Max 171/85  Monitor   Permissive hypertension (OK if < 220/120) but gradually normalize in 5-7 days  Long-term BP goal normotensive  Hyperlipidemia  Home meds:  No statin  LDL 163, goal < 70  Add lipitor 20  Continue statin at discharge  Other Stroke Risk Factors  Advanced age  ETOH use, advised to drink no more than 1 drink(s) a day  Family hx stroke (father x 2) and HLD  Other Active Problems  Depression - on wellbutrine  GERD  R breast cancer s/p Sx  R parotid tumor  RLS on requip -  following with Dr. Jannifer Franklin at [facility] day # 0  Rosalin Hawking, MD PhD Stroke Neurology 11/01/2015 4:43 PM   To contact Stroke Continuity provider, please refer to [email or web address] After hours, contact General Neurology

## 2015-11-01 NOTE — H&P (Signed)
History and Physical    Shelley Coleman DTO:[id number] DOB: [birth year] DOA: 11/01/2015   PCP: Dorothyann Peng, NP Chief Complaint:  Chief Complaint  Patient presents with  . Numbness    HPI: Shelley Coleman is a 75 y.o. female with medical history significant of HLD, BRCA.  Patient presents to the ED at [facility] with acute onset of numbness of RUE and RLE and difficulty walking.  No PMH of stroke nor TIA.  Not on antiplatelet therapy.  Symptoms have persisted since onset at 1AM.  Nothing makes better or worse.  No facial droop, no change in speech.  Has tried nothing for symptoms.  ED Course: CT shows nothing acute, old left basal ganglia infarct seen.  Review of Systems: As per HPI otherwise 10 point review of systems negative.    Past Medical History  Diagnosis Date  . Depression   . GERD (gastroesophageal reflux disease)   . Hyperlipidemia   . Breast cancer ([place])     right  . Ovarian cyst   . Parotid tumor     right  . Shingles 2000    Left in V1  . Hx of mitral valve prolapse   . Irregular heart beat   . Restless legs syndrome   . Osteopenia   . Optic neuritis     left  . Pelvic fracture ([place])   . History of GI bleed   . Hiatal hernia   . Anemia     Has had iron infusions  . Depression   . Nocturnal leg cramps 09/02/2015    Past Surgical History  Procedure Laterality Date  . Appendectomy    . Abdominal hysterectomy    . Spine surgery    . Mastectomy Right   . Breast lumpectomy Left   . Total hip arthroplasty Left   . Hernia repair  08/2009  . Esophageal manometry N/A 01/13/2013    Procedure: ESOPHAGEAL MANOMETRY (EM);  Surgeon: Sable Feil, MD;  Location: [facility];  Service: Endoscopy;  Laterality: N/A;  . Cataract extraction       reports that she has never smoked. She has never used smokeless tobacco. She reports that she drinks alcohol. She reports that she does not use illicit drugs.  Allergies  Allergen Reactions  . Diflunisal     REACTION: rash ,  swelling  anaphlaxsis  . Tetanus Toxoid     REACTION: arm swelling, fever    Family History  Problem Relation Age of Onset  . Cancer Mother     Angio sarcoma   . Heart disease Father   . Hypertension Father   . Stroke Father     x2  . Hernia Brother   . Hypertension Brother   . Heart attack Father     x 11      Prior to Admission medications   Medication Sig Start Date End Date Taking? Authorizing Provider  ALPRAZolam Duanne Moron) 1 MG tablet TAKE ONE TABLET BY MOUTH AT BEDTIME 08/06/15  Yes Dorothyann Peng, NP  atenolol (TENORMIN) 50 MG tablet Take 50 mg by mouth daily as needed (for blood pressure).  06/27/11  Yes Dorena Cookey, MD  buPROPion [facility] SR) 150 MG 12 hr tablet Take 1 tablet (150 mg total) by mouth 2 (two) times daily. 03/19/15  Yes Dorothyann Peng, NP  DEXILANT 60 MG capsule TAKE ONE CAPSULE BY MOUTH ONCE DAILY. 08/06/15  Yes Dorothyann Peng, NP  HYDROcodone-acetaminophen (NORCO) 7.5-325 MG tablet Take 1 tablet by mouth every  6 (six) hours as needed. 08/23/15  Yes Kathrynn Ducking, MD  ibuprofen (ADVIL,MOTRIN) 200 MG tablet Take 200 mg by mouth every 6 (six) hours as needed for mild pain.   Yes Historical Provider, MD  MELATONIN PO Take 1 tablet by mouth at bedtime as needed (for sleep).    Yes Historical Provider, MD  Multiple Vitamin (MULTIVITAMIN) tablet Take 1 tablet by mouth daily.    Yes Historical Provider, MD  NITROSTAT 0.4 MG SL tablet PLACE ONE TABLET UNDER THE TONGUE EVERY 5 MINUTES AS NEEDED. 11/16/14  Yes Dorena Cookey, MD  rOPINIRole (REQUIP) 1 MG tablet One tablet at 5 pm, one tablet at bed time Patient taking differently: Take 1 mg by mouth daily. At 4 pm 09/02/15  Yes Kathrynn Ducking, MD  rOPINIRole (REQUIP) 3 MG tablet TAKE ONE TABLET BY MOUTH ONCE DAILY AT BEDTIME 08/06/15  Yes Kathrynn Ducking, MD    Physical Exam: Filed Vitals:   11/01/15 0431 11/01/15 0445 11/01/15 0500 11/01/15 0501  BP: 148/83   134/75  Pulse: 81 95 79 80  Temp:      TempSrc:        Resp: 18 23 11 18   SpO2: 99% 100% 97% 96%      Constitutional: NAD, calm, comfortable Eyes: PERRL, lids and conjunctivae normal ENMT: Mucous membranes are moist. Posterior pharynx clear of any exudate or lesions.Normal dentition.  Neck: normal, supple, no masses, no thyromegaly Respiratory: clear to auscultation bilaterally, no wheezing, no crackles. Normal respiratory effort. No accessory muscle use.  Cardiovascular: Regular rate and rhythm, no murmurs / rubs / gallops. No extremity edema. 2+ pedal pulses. No carotid bruits.  Abdomen: no tenderness, no masses palpated. No hepatosplenomegaly. Bowel sounds positive.  Musculoskeletal: no clubbing / cyanosis. No joint deformity upper and lower extremities. Good ROM, no contractures. Normal muscle tone.  Skin: no rashes, lesions, ulcers. No induration Neurologic: CN 2-12 grossly intact. Sensation reduced on R extremities compared to L, DTR normal. Strength 5/5 in all 4.  Psychiatric: Normal judgment and insight. Alert and oriented x 3. Normal mood.     Labs on Admission: I have personally reviewed following labs and imaging studies  CBC:  Recent Labs Lab 11/01/15 0340  WBC 9.2  NEUTROABS 6.6  HGB 12.8  HCT 38.5  MCV 89.5  PLT 428   Basic Metabolic Panel:  Recent Labs Lab 11/01/15 0340  NA 139  K 3.6  CL 107  CO2 24  GLUCOSE 99  BUN 22*  CREATININE 0.61  CALCIUM 9.0   GFR: CrCl cannot be calculated (Unknown ideal weight.). Liver Function Tests:  Recent Labs Lab 11/01/15 0340  AST 22  ALT 18  ALKPHOS 74  BILITOT 0.2*  PROT 6.8  ALBUMIN 4.1   No results for input(s): LIPASE, AMYLASE in the last 168 hours. No results for input(s): AMMONIA in the last 168 hours. Coagulation Profile:  Recent Labs Lab 11/01/15 0340  INR 1.09   Cardiac Enzymes: No results for input(s): CKTOTAL, CKMB, CKMBINDEX, TROPONINI in the last 168 hours. BNP (last 3 results) No results for input(s): PROBNP in the last 8760  hours. HbA1C: No results for input(s): HGBA1C in the last 72 hours. CBG:  Recent Labs Lab 11/01/15 0339  GLUCAP 103*   Lipid Profile: No results for input(s): CHOL, HDL, LDLCALC, TRIG, CHOLHDL, LDLDIRECT in the last 72 hours. Thyroid Function Tests: No results for input(s): TSH, T4TOTAL, FREET4, T3FREE, THYROIDAB in the last 72 hours. Anemia Panel: No  results for input(s): VITAMINB12, FOLATE, FERRITIN, TIBC, IRON, RETICCTPCT in the last 72 hours. Urine analysis:    Component Value Date/Time   COLORURINE AMBER BIOCHEMICALS MAY BE AFFECTED BY COLOR* 09/10/2009 0036   APPEARANCEUR TURBID* 09/10/2009 0036   LABSPEC 1.018 09/10/2009 0036   PHURINE 6.5 09/10/2009 0036   GLUCOSEU NEGATIVE 09/10/2009 0036   HGBUR LARGE* 09/10/2009 0036   HGBUR trace-lysed 01/15/2009 0750   BILIRUBINUR n 02/11/2015 1205   BILIRUBINUR SMALL* 09/10/2009 0036   KETONESUR NEGATIVE 09/10/2009 0036   PROTEINUR n 02/11/2015 1205   PROTEINUR 30* 09/10/2009 0036   UROBILINOGEN 0.2 02/11/2015 1205   UROBILINOGEN 1.0 09/10/2009 0036   NITRITE n 02/11/2015 1205   NITRITE NEGATIVE 09/10/2009 0036   LEUKOCYTESUR small (1+)* 02/11/2015 1205   Sepsis Labs: @LABRCNTIP (procalcitonin:4,lacticidven:4) )No results found for this or any previous visit (from the past 240 hour(s)).   Radiological Exams on Admission: Ct Head Code Stroke W/o Cm  11/01/2015  CLINICAL DATA:  Code stroke.  Sudden onset of right-sided weakness. EXAM: CT HEAD WITHOUT CONTRAST TECHNIQUE: Contiguous axial images were obtained from the base of the skull through the vertex without intravenous contrast. COMPARISON:  None. FINDINGS: Brain: Well-defined remote appearing appearing lacunar infarct in the left basal ganglia. No intracranial hemorrhage, mass effect, or midline shift. No hydrocephalus. The basilar cisterns are patent. No evidence of territorial infarct. Mild chronic small vessel ischemia. No intracranial fluid collection. Vascular: No  hyperdense vessel or abnormal calcification. Atherosclerosis of skullbase vasculature. Skull:  Calvarium is intact. Sinuses/Orbits: Included paranasal sinuses and mastoid air cells are well aerated. Other: None. IMPRESSION: Remote lacunar infarct in the left basal ganglia. No hemorrhage or CT findings of acute ischemia. These results were called by telephone at the time of interpretation on 11/01/2015 at 4:06 am to Dr. Noemi Chapel , who verbally acknowledged these results. Electronically Signed   By: Jeb Levering M.D.   On: 11/01/2015 04:06    EKG: Independently reviewed.  Assessment/Plan Active Problems:   Acute ischemic stroke (HCC)  Acute ischemic stroke - L MCA territory subcortical  Stroke pathway  ASA 325 ordered  Imaging and lab studies as per protocol ordered: 2d echo, MRI/MRA, carotid duplex, A1C, lipid profile  PT/OT consults ordered  Tele monitor     DVT prophylaxis: Lovenox Code Status: Full Family Communication: Daughter at bedside Consults called: Neuro consult in chart Admission status: Admit to inpatient   [place], [place] [facility] Pager [phone] from 7PM-7AM  If 7AM-7PM, please contact the day physician for the patient [email or web address] Password [email or web address]  11/01/2015, 5:19 AM

## 2015-11-01 NOTE — ED Notes (Signed)
Pt to CT via stretcher

## 2015-11-01 NOTE — Care Management Important Message (Signed)
Important Message  Patient Details  Name: Shelley Coleman MRN: [id number] Date of Birth: 1941-05-01   Medicare Important Message Given:  Yes    [name] Abena 11/01/2015, 11:21 AM

## 2015-11-01 NOTE — Care Management Obs Status (Signed)
[place] NOTIFICATION   Patient Details  Name: Shelley Coleman MRN: [id number] Date of Birth: May 02, 1940   Medicare Observation Status Notification Given:  Yes (MRI negative)    Pollie Friar, RN 11/01/2015, 3:05 PM

## 2015-11-01 NOTE — ED Notes (Signed)
Patient transported to CT by Kallie Locks, RN.

## 2015-11-01 NOTE — ED Notes (Signed)
Report given to [facility]. 

## 2015-11-01 NOTE — Code Documentation (Signed)
Code stroke called at 0342 for this pt located in [place].  Pt noted to be LSW at 0100 hrs.  Pt states she had a busy day, rested from 2100-2200 then got up to bake  a cake for her daughter.  At 0100 she took her dog outside for a walk and noted that her balance was off, she felt dizzy and nauseated.  She staggered back indoors noting numbness to RUE and RLE.  She took 4 Baby ASA at 0215, vomited about 30 minutes later, called her daughter and subsequently  called EMS.  She was taken to [facility] where her head CT showed no acute findings.  She was then transported to [facility] via [place] arriving at [place].  Her NIHSS scored a 1 for partial sensory loss on right side.  Pt remains within the TPA window until 0530.  To be admitted to medicine service.

## 2015-11-01 NOTE — Progress Notes (Signed)
Patient c/o right extremeties is getting worsen "as if it is going to sleep". She appears to be having difficulty explaining how she feels at this time. RN paged attending MD. Awaiting for call back.   Ave Filter, RN

## 2015-11-01 NOTE — Progress Notes (Signed)
Patient seen and examined  Shelley Coleman is a 75 y.o. female with medical history significant of HLD, BRCA. Patient presents to the ED at [facility] with acute onset of numbness of RUE and RLE and difficulty walking. No PMH of stroke nor TIA. Not on antiplatelet therapy. Symptoms have persisted since onset at 1AM.. CT scan of her head showed no acute intracranial abnormality. Old left basal ganglia infarct was noted. Numbness has persisted, involving right upper and lower extremities. She had no facial droop and no apparent change in speech  Assessment and plan probable left subcortical MCA territory TIA vs CVA  1. HgbA1c pending, fasting lipid panel , LDL 153, triglycerides 114 2. MRI, MRA of the brain without contrast-pending 3. PT consult, OT consult,  -outpatient OT  , Speech consult  4. Echocardiogram -pending 5. Carotid dopplers -pending 6. Prophylactic therapy-Antiplatelet med: Aspirin  7. Risk factor modification 8. Telemetry monitoring -normal sinus rhythm  essential hypertension-continue metformin  Depression/anxiety -continue Wellbutrin /Xanax  Dyslipidemia-start patient on statin  Restless leg syndrome -continue Requip  Discussed with the patient's daughter by the bedside

## 2015-11-01 NOTE — ED Provider Notes (Addendum)
CSN: [id number]     Arrival date & time 11/01/15  0320 History   First MD Initiated Contact with Patient 11/01/15 [phone]     Chief Complaint  Patient presents with  . Numbness     (Consider location/radiation/quality/duration/timing/severity/associated sxs/prior Treatment) HPI  Ms. Browder Is a 75 year old female with a past medical history of hyperlipidemia, presenting today with an acute stroke. Patient was transferred from [facility] for stroke symptoms. She states she went to walk her dog tonight felt her right arm and right leg have weakness and go numb. She also had some ataxia. She denies any slurred speech or facial droop. She subsequently presented the hospital for further evaluation. CT scan of the head was negative at [facility], patient transferred for further neurological care. Patient states she continues to have numbness in her right hand and leg. She denies any history of stroke. There are no further complaints.  10 Systems reviewed and are negative for acute change except as noted in the HPI.     Past Medical History  Diagnosis Date  . Depression   . GERD (gastroesophageal reflux disease)   . Hyperlipidemia   . Breast cancer ([place])     right  . Ovarian cyst   . Parotid tumor     right  . Shingles 2000    Left in V1  . Hx of mitral valve prolapse   . Irregular heart beat   . Restless legs syndrome   . Osteopenia   . Optic neuritis     left  . Pelvic fracture ([place])   . History of GI bleed   . Hiatal hernia   . Anemia     Has had iron infusions  . Depression   . Nocturnal leg cramps 09/02/2015   Past Surgical History  Procedure Laterality Date  . Appendectomy    . Abdominal hysterectomy    . Spine surgery    . Mastectomy Right   . Breast lumpectomy Left   . Total hip arthroplasty Left   . Hernia repair  08/2009  . Esophageal manometry N/A 01/13/2013    Procedure: ESOPHAGEAL MANOMETRY (EM);  Surgeon: Sable Feil, MD;  Location: [facility];  Service:  Endoscopy;  Laterality: N/A;  . Cataract extraction     Family History  Problem Relation Age of Onset  . Cancer Mother     Angio sarcoma   . Heart disease Father   . Hypertension Father   . Stroke Father     x2  . Hernia Brother   . Hypertension Brother   . Heart attack Father     x 11   Social History  Substance Use Topics  . Smoking status: Never Smoker   . Smokeless tobacco: Never Used  . Alcohol Use: 0.0 oz/week    0 Standard drinks or equivalent per week     Comment: Rarely,socially   OB History    No data available     Review of Systems    Allergies  Diflunisal and Tetanus toxoid  Home Medications   Prior to Admission medications   Medication Sig Start Date End Date Taking? Authorizing Provider  ALPRAZolam Duanne Moron) 1 MG tablet TAKE ONE TABLET BY MOUTH AT BEDTIME 08/06/15   Dorothyann Peng, NP  atenolol (TENORMIN) 50 MG tablet Take 50 mg by mouth daily. As needed 06/27/11   Dorena Cookey, MD  buPROPion [facility] SR) 150 MG 12 hr tablet Take 1 tablet (150 mg total) by mouth 2 (two)  times daily. 03/19/15   Dorothyann Peng, NP  DEXILANT 60 MG capsule TAKE ONE CAPSULE BY MOUTH ONCE DAILY. 08/06/15   Dorothyann Peng, NP  HYDROcodone-acetaminophen (NORCO) 7.5-325 MG tablet Take 1 tablet by mouth every 6 (six) hours as needed. 08/23/15   Kathrynn Ducking, MD  Ibuprofen (ADVIL PO) Take 200 mg by mouth every 4 (four) hours as needed. Take 100mg  every 6 hours as needed    Historical Provider, MD  MELATONIN PO Take by mouth at bedtime as needed.    Historical Provider, MD  Multiple Vitamin (MULTIVITAMIN) tablet Take by mouth.    Historical Provider, MD  NITROSTAT 0.4 MG SL tablet PLACE ONE TABLET UNDER THE TONGUE EVERY 5 MINUTES AS NEEDED. 11/16/14   Dorena Cookey, MD  rOPINIRole (REQUIP) 1 MG tablet One tablet at 5 pm, one tablet at bed time 09/02/15   Kathrynn Ducking, MD  rOPINIRole (REQUIP) 3 MG tablet TAKE ONE TABLET BY MOUTH ONCE DAILY AT BEDTIME 08/06/15   Kathrynn Ducking, MD    BP 148/83 mmHg  Pulse 81  Temp(Src) 98.1 F (36.7 C) (Oral)  Resp 18  SpO2 99% Physical Exam  Constitutional: She is oriented to person, place, and time. She appears well-developed and well-nourished. No distress.  HENT:  Head: Normocephalic and atraumatic.  Nose: Nose normal.  Mouth/Throat: Oropharynx is clear and moist. No oropharyngeal exudate.  Eyes: Conjunctivae and EOM are normal. Pupils are equal, round, and reactive to light. No scleral icterus.  Neck: Normal range of motion. Neck supple. No JVD present. No tracheal deviation present. No thyromegaly present.  Cardiovascular: Normal rate, regular rhythm and normal heart sounds.  Exam reveals no gallop and no friction rub.   No murmur heard. Pulmonary/Chest: Effort normal and breath sounds normal. No respiratory distress. She has no wheezes. She exhibits no tenderness.  Abdominal: Soft. Bowel sounds are normal. She exhibits no distension and no mass. There is no tenderness. There is no rebound and no guarding.  Musculoskeletal: Normal range of motion. She exhibits no edema or tenderness.  Normal strength in all extremes. Decreased sensation in the right upper and right lower extremity. Normal cerebellar testing.  Lymphadenopathy:    She has no cervical adenopathy.  Neurological: She is alert and oriented to person, place, and time. No cranial nerve deficit. She exhibits normal muscle tone.  Skin: Skin is warm and dry. No rash noted. No erythema. No pallor.  Nursing note and vitals reviewed.   ED Course  Procedures (including critical care time) Labs Review Labs Reviewed  COMPREHENSIVE METABOLIC PANEL - Abnormal; Notable for the following:    BUN 22 (*)    Total Bilirubin 0.2 (*)    All other components within normal limits  CBG MONITORING, ED - Abnormal; Notable for the following:    Glucose-Capillary 103 (*)    All other components within normal limits  PROTIME-INR  APTT  CBC  DIFFERENTIAL  I-STAT TROPOININ, ED   I-STAT CHEM 8, ED    Imaging Review Ct Head Code Stroke W/o Cm  11/01/2015  CLINICAL DATA:  Code stroke.  Sudden onset of right-sided weakness. EXAM: CT HEAD WITHOUT CONTRAST TECHNIQUE: Contiguous axial images were obtained from the base of the skull through the vertex without intravenous contrast. COMPARISON:  None. FINDINGS: Brain: Well-defined remote appearing appearing lacunar infarct in the left basal ganglia. No intracranial hemorrhage, mass effect, or midline shift. No hydrocephalus. The basilar cisterns are patent. No evidence of territorial infarct. Mild chronic small  vessel ischemia. No intracranial fluid collection. Vascular: No hyperdense vessel or abnormal calcification. Atherosclerosis of skullbase vasculature. Skull:  Calvarium is intact. Sinuses/Orbits: Included paranasal sinuses and mastoid air cells are well aerated. Other: None. IMPRESSION: Remote lacunar infarct in the left basal ganglia. No hemorrhage or CT findings of acute ischemia. These results were called by telephone at the time of interpretation on 11/01/2015 at 4:06 am to Dr. Noemi Chapel , who verbally acknowledged these results. Electronically Signed   By: Jeb Levering M.D.   On: 11/01/2015 04:06   I have personally reviewed and evaluated these images and lab results as part of my medical decision-making.   EKG Interpretation   Date/Time:  Monday November 01 2015 03:42:31 EDT Ventricular Rate:  84 PR Interval:    QRS Duration: 94 QT Interval:  384 QTC Calculation: [id number] R Axis:   65 Text Interpretation:  Sinus rhythm No significant change since last  tracing Confirmed by Glynn Octave [phone]) on 11/01/2015 4:42:55 AM      MDM   Final diagnoses:  Right arm numbness  Acute ischemic stroke [facility])  Patient presents to the emergency department for an acute stroke. Dr. Nicole Kindred also evaluated the patient emergency department and he agrees with the diagnosis. He advised to admit to triad hospitalist for further  care. Currently patient's symptoms are too mild to justify TPA.   Everlene Balls, MD 11/01/15 [phone]

## 2015-11-01 NOTE — ED Notes (Signed)
Pt states around 1am she had just finished baking a cake and was going to take her dog out and as she was walking to the door she started to feel funny, started to stagger to the right and became dizzy  Pt states she sat down and her right arm and leg started to feel like it was going to sleep  Pt states she has a mild headache and since then she has had nausea and one episode of vomiting  No facial droop noted  Right grip slightly weaker than the right  Pt taken to [facility] and Dr Sabra Heck notified of pt's condition

## 2015-11-01 NOTE — Progress Notes (Signed)
ELECTROENCEPHALOGRAM REPORT  Patient: Shelley Coleman      Age: 75 y.o.        Sex: female Referring Physician: Erlinda Hong Report Date:  11/01/2015        Interpreting Physician: Darrel Reach  History: Shelley Coleman is an 75 y.o. female admitted with right sided tingling.   Indications for study:  Evaluate for possible seizure  Technique: This is an 18 channel routine scalp EEG performed at the bedside with bipolar and monopolar montages arranged in accordance to the international 10/20 system of electrode placement.   Description: EKG channel demonstrates a regular rhythm with a rate of 60-65 bpm.   She has prominent muscle artifact. Voltages are mildly reduced throughout. Her best dominant posterior rhythm is 10-12 Hz. She appears drowsy through much of the study with intermixed theta observed.   She has no focal asymmetries or epileptiform discharges. No seizure activity is present.   Interpretation:  This appears to be a normal awake and drowsy EEG.   Melba Coon, MD 8:19 PM

## 2015-11-01 NOTE — ED Provider Notes (Signed)
CSN: [id number]     Arrival date & time 11/01/15  0320 History   First MD Initiated Contact with Patient 11/01/15 [phone]     Chief Complaint  Patient presents with  . Numbness     (Consider location/radiation/quality/duration/timing/severity/associated sxs/prior Treatment) HPI Comments: The patient is a 75 year old female, she has a history of some neuropathy in her legs, hypertension and acid reflux. She presents to the hospital with her daughter after she had acute onset of right upper extremity and right lower extremity numbness and tingling with some difficulty walking which occurred at 1:00 AM. The patient had been baking a cake prior to this, she had been dishes, she had no difficulty with those tasks however when she got her dog to take the dog on a walk before going to bed she noticed these lateralizing symptoms. Since that time the patient called her daughter who brought her to the hospital. She states that she feels a bit of dizziness which she describes as not being able to walk with good balance, she states that she feels off balance, there is no visual changes, no speech changes, her daughter who is at the bedside states that she looks and sounds at her baseline. The patient has never had any stroke or heart attack, she did take 4 baby aspirin prior to arrival.  The history is provided by the patient.    Past Medical History  Diagnosis Date  . Depression   . GERD (gastroesophageal reflux disease)   . Hyperlipidemia   . Breast cancer ([place])     right  . Ovarian cyst   . Parotid tumor     right  . Shingles 2000    Left in V1  . Hx of mitral valve prolapse   . Irregular heart beat   . Restless legs syndrome   . Osteopenia   . Optic neuritis     left  . Pelvic fracture ([place])   . History of GI bleed   . Hiatal hernia   . Anemia     Has had iron infusions  . Depression   . Nocturnal leg cramps 09/02/2015   Past Surgical History  Procedure Laterality Date  . Appendectomy     . Abdominal hysterectomy    . Spine surgery    . Mastectomy Right   . Breast lumpectomy Left   . Total hip arthroplasty Left   . Hernia repair  08/2009  . Esophageal manometry N/A 01/13/2013    Procedure: ESOPHAGEAL MANOMETRY (EM);  Surgeon: Sable Feil, MD;  Location: [facility];  Service: Endoscopy;  Laterality: N/A;  . Cataract extraction     Family History  Problem Relation Age of Onset  . Cancer Mother     Angio sarcoma   . Heart disease Father   . Hypertension Father   . Stroke Father     x2  . Hernia Brother   . Hypertension Brother   . Heart attack Father     x 11   Social History  Substance Use Topics  . Smoking status: Never Smoker   . Smokeless tobacco: Never Used  . Alcohol Use: 0.0 oz/week    0 Standard drinks or equivalent per week     Comment: Rarely,socially   OB History    No data available     Review of Systems  All other systems reviewed and are negative.     Allergies  Diflunisal and Tetanus toxoid  Home Medications   Prior to Admission  medications   Medication Sig Start Date End Date Taking? Authorizing Provider  ALPRAZolam Duanne Moron) 1 MG tablet TAKE ONE TABLET BY MOUTH AT BEDTIME 08/06/15  Yes Dorothyann Peng, NP  atenolol (TENORMIN) 50 MG tablet Take 50 mg by mouth daily as needed (for blood pressure).  06/27/11  Yes Dorena Cookey, MD  buPROPion [facility] SR) 150 MG 12 hr tablet Take 1 tablet (150 mg total) by mouth 2 (two) times daily. 03/19/15  Yes Dorothyann Peng, NP  DEXILANT 60 MG capsule TAKE ONE CAPSULE BY MOUTH ONCE DAILY. 08/06/15  Yes Dorothyann Peng, NP  HYDROcodone-acetaminophen (NORCO) 7.5-325 MG tablet Take 1 tablet by mouth every 6 (six) hours as needed. 08/23/15  Yes Kathrynn Ducking, MD  ibuprofen (ADVIL,MOTRIN) 200 MG tablet Take 200 mg by mouth every 6 (six) hours as needed for mild pain.   Yes Historical Provider, MD  MELATONIN PO Take 1 tablet by mouth at bedtime as needed (for sleep).    Yes Historical Provider, MD   Multiple Vitamin (MULTIVITAMIN) tablet Take 1 tablet by mouth daily.    Yes Historical Provider, MD  NITROSTAT 0.4 MG SL tablet PLACE ONE TABLET UNDER THE TONGUE EVERY 5 MINUTES AS NEEDED. 11/16/14  Yes Dorena Cookey, MD  rOPINIRole (REQUIP) 1 MG tablet One tablet at 5 pm, one tablet at bed time Patient taking differently: Take 1 mg by mouth daily. At 4 pm 09/02/15  Yes Kathrynn Ducking, MD  rOPINIRole (REQUIP) 3 MG tablet TAKE ONE TABLET BY MOUTH ONCE DAILY AT BEDTIME 08/06/15  Yes Kathrynn Ducking, MD   BP 134/75 mmHg  Pulse 80  Temp(Src) 98.1 F (36.7 C) (Oral)  Resp 18  SpO2 96% Physical Exam  Constitutional: She appears well-developed and well-nourished. No distress.  HENT:  Head: Normocephalic and atraumatic.  Mouth/Throat: Oropharynx is clear and moist. No oropharyngeal exudate.  Eyes: Conjunctivae and EOM are normal. Pupils are equal, round, and reactive to light. Right eye exhibits no discharge. Left eye exhibits no discharge. No scleral icterus.  Neck: Normal range of motion. Neck supple. No JVD present. No thyromegaly present.  Cardiovascular: Normal rate, regular rhythm, normal heart sounds and intact distal pulses.  Exam reveals no gallop and no friction rub.   No murmur heard. No carotid bruits  Pulmonary/Chest: Effort normal and breath sounds normal. No respiratory distress. She has no wheezes. She has no rales.  Abdominal: Soft. Bowel sounds are normal. She exhibits no distension and no mass. There is no tenderness.  Musculoskeletal: Normal range of motion. She exhibits no edema or tenderness.  Lymphadenopathy:    She has no cervical adenopathy.  Neurological: She is alert. Coordination normal.  The patient has clear speech, cranial nerves III through XII are intact, normal strength in the bilateral upper extremities at the shoulders elbows wrists and grips, is able to straight leg raise bilaterally though she has some slight weakness of the right ankle (the patient does  have a chronic peripheral neuropathy on the right which causes some difficulty with strength at the ankle). She has no pronator drift however when she tries to perform finger-nose-finger she has a slight discoordination on the right. Rapid alternating movements are normal.  Skin: Skin is warm and dry. No rash noted. No erythema.  Psychiatric: She has a normal mood and affect. Her behavior is normal.  Nursing note and vitals reviewed.   ED Course  Procedures (including critical care time) Labs Review Labs Reviewed  COMPREHENSIVE METABOLIC PANEL - Abnormal;  Notable for the following:    BUN 22 (*)    Total Bilirubin 0.2 (*)    All other components within normal limits  CBG MONITORING, ED - Abnormal; Notable for the following:    Glucose-Capillary 103 (*)    All other components within normal limits  PROTIME-INR  APTT  CBC  DIFFERENTIAL  HEMOGLOBIN A1C  LIPID PANEL  I-STAT TROPOININ, ED  I-STAT CHEM 8, ED    Imaging Review Ct Head Code Stroke W/o Cm  11/01/2015  CLINICAL DATA:  Code stroke.  Sudden onset of right-sided weakness. EXAM: CT HEAD WITHOUT CONTRAST TECHNIQUE: Contiguous axial images were obtained from the base of the skull through the vertex without intravenous contrast. COMPARISON:  None. FINDINGS: Brain: Well-defined remote appearing appearing lacunar infarct in the left basal ganglia. No intracranial hemorrhage, mass effect, or midline shift. No hydrocephalus. The basilar cisterns are patent. No evidence of territorial infarct. Mild chronic small vessel ischemia. No intracranial fluid collection. Vascular: No hyperdense vessel or abnormal calcification. Atherosclerosis of skullbase vasculature. Skull:  Calvarium is intact. Sinuses/Orbits: Included paranasal sinuses and mastoid air cells are well aerated. Other: None. IMPRESSION: Remote lacunar infarct in the left basal ganglia. No hemorrhage or CT findings of acute ischemia. These results were called by telephone at the time of  interpretation on 11/01/2015 at 4:06 am to Dr. Noemi Chapel , who verbally acknowledged these results. Electronically Signed   By: Jeb Levering M.D.   On: 11/01/2015 04:06   I have personally reviewed and evaluated these images and lab results as part of my medical decision-making.  ED ECG REPORT  I personally interpreted this EKG   Date: 11/01/2015   Rate: 84  Rhythm: normal sinus rhythm  QRS Axis: normal  Intervals: normal  ST/T Wave abnormalities: normal  Conduction Disutrbances:none  Narrative Interpretation:   Old EKG Reviewed: none available   MDM   Final diagnoses:  Right arm numbness  Acute ischemic stroke [place])    The patient does have some lateralizing symptoms. She still has a sensory deficit to the right upper extremity where she has an ongoing feeling of neuropathy. Otherwise her stroke scale is fairly low, her symptoms are not necessarily improving, will discuss with neurology, code stroke activated as the patient was last seen normal at 1:00 AM thus making it less than 3 hours since onset  dw Dr. Nicole Kindred immediately - he requests that the pt be transported to the ED at [facility] for evaluation by stroke team - CT neg for hemorrhage.  Noemi Chapel, MD 11/01/15 [phone]

## 2015-11-01 NOTE — Evaluation (Signed)
Physical Therapy Evaluation Patient Details Name: LASHUNNA DAZZO MRN: [id number] DOB: May 11, 1940 Today's Date: 11/01/2015   History of Present Illness  75 yo female admitted with R UE R LE numbness and difficulty walking. CT-remote L basal ganglia PMH: depression, GERD, R Breast CA, hx mitral valve prolapse optic neuritis, restless legs, spine surg, L hip surg, peripheral neuropathy  Clinical Impression  Patient presents with right sided weakness, decreased proprioception and decreased sensation right side impacting balance and mobility. Tolerated gait training with Min A for balance due to veering and staggering right and right ankle instability. Would benefit from use of RW for support. Pt independent PTA and very active. Pt will have 24/7 S from daughter at discharge. Will follow acutely to maximize independence and mobility prior to return home.    Follow Up Recommendations Outpatient PT;Supervision for mobility/OOB    Equipment Recommendations  None recommended by PT    Recommendations for Other Services OT consult     Precautions / Restrictions Precautions Precautions: Fall Restrictions Weight Bearing Restrictions: No      Mobility  Bed Mobility Overal bed mobility: Modified Independent             General bed mobility comments: Able to get to EOB without assist. Reports heavy feeling in head.  Transfers Overall transfer level: Needs assistance Equipment used: None Transfers: Sit to/from Stand Sit to Stand: Min guard         General transfer comment: Min guard for safety. Unsteadiness in standing. Transferred to chair post ambulation bout.  Ambulation/Gait Ambulation/Gait assistance: Min assist Ambulation Distance (Feet): 150 Feet Assistive device: None Gait Pattern/deviations: Step-through pattern;Decreased stride length;Staggering right Gait velocity: decreased   General Gait Details: Veering and staggering to the right with narrow BoS and right ankle  instability. 1 almost LOB with right knee instability grabbing into the door to prevent fall.  Stairs            Wheelchair Mobility    Modified Rankin (Stroke Patients Only) Modified Rankin (Stroke Patients Only) Pre-Morbid Rankin Score: No symptoms Modified Rankin: Moderately severe disability     Balance Overall balance assessment: Needs assistance Sitting-balance support: Feet supported;No upper extremity supported Sitting balance-Leahy Scale: Good Sitting balance - Comments: able to donn/doff socks reaching outside BoS without diffiuclty or LOB.   Standing balance support: During functional activity Standing balance-Leahy Scale: Fair                               Pertinent Vitals/Pain Pain Assessment: Faces Faces Pain Scale: Hurts little more Pain Location: headache Pain Descriptors / Indicators: Headache Pain Intervention(s): Monitored during session;Repositioned;Patient requesting pain meds-RN notified    Home Living Family/patient expects to be discharged to:: Private residence Living Arrangements: Alone Available Help at Discharge: Family;Available 24 hours/day Type of Home: House Home Access: Stairs to enter   [employer] of Steps: 1 Home Layout: One level Home Equipment: Walker - 2 wheels      Prior Function Level of Independence: Independent         Comments: drives, volunteers at church, takes care of pets, enjoys yard work     Journalist, newspaper   Dominant Hand: Left    Extremity/Trunk Assessment   Upper Extremity Assessment: Defer to OT evaluation           Lower Extremity Assessment: RLE deficits/detail;LLE deficits/detail RLE Deficits / Details: Grossly ~3/5 knee flexion, 3+/5 throughout  Communication   Communication: No difficulties  Cognition Arousal/Alertness: Awake/alert Behavior During Therapy: WFL for tasks assessed/performed Overall Cognitive Status: Within Functional Limits for tasks  assessed                      General Comments      Exercises        Assessment/Plan    PT Assessment Patient needs continued PT services  PT Diagnosis Difficulty walking;Generalized weakness   PT Problem List Decreased strength;Decreased coordination;Pain;Impaired sensation;Decreased activity tolerance;Decreased balance;Decreased mobility  PT Treatment Interventions Balance training;Gait training;Stair training;Functional mobility training;Therapeutic activities;Therapeutic exercise;Patient/family education;Neuromuscular re-education;DME instruction   PT Goals (Current goals can be found in the Care Plan section) Acute Rehab PT Goals Patient Stated Goal: to return to independence PT Goal Formulation: With patient Time For Goal Achievement: 11/15/15 Potential to Achieve Goals: Good    Frequency Min 4X/week   Barriers to discharge        Co-evaluation               End of Session Equipment Utilized During Treatment: Gait belt Activity Tolerance: Patient tolerated treatment well Patient left: in chair;with call bell/phone within reach Nurse Communication: Mobility status    Functional Assessment Tool Used: clinical judgment Functional Limitation: Mobility: Walking and moving around Mobility: Walking and Moving Around Current Status [phone]): At least 20 percent but less than 40 percent impaired, limited or restricted Mobility: Walking and Moving Around Goal Status [phone]): At least 1 percent but less than 20 percent impaired, limited or restricted    Time: 1540-1611 PT Time Calculation (min) (ACUTE ONLY): 31 min   Charges:   PT Evaluation $PT Eval Moderate Complexity: 1 Procedure PT Treatments $Gait Training: 8-22 mins   PT G Codes:   PT G-Codes **NOT FOR INPATIENT CLASS** Functional Assessment Tool Used: clinical judgment Functional Limitation: Mobility: Walking and moving around Mobility: Walking and Moving Around Current Status [id number]): At least  20 percent but less than 40 percent impaired, limited or restricted Mobility: Walking and Moving Around Goal Status [phone]): At least 1 percent but less than 20 percent impaired, limited or restricted    [place] 11/01/2015, 4:20 PM Wray Kearns, [place], DPT [phone]

## 2015-11-01 NOTE — ED Notes (Addendum)
PT reports going out to walk her dog tonight and then felt right arm and leg weakness and that her right hand was going to sleep. PT alert and oriented x 4 at this time pt able to read sentences and individual words without difficulty, able to describe pictorial scenarios without difficulty. Pt reports last normal time at 0100.

## 2015-11-01 NOTE — Consult Note (Signed)
Admission H&P    Chief Complaint: New onset right side and numbness and difficulty with walking.  HPI: Shelley Coleman is an 75 y.o. female with a history of hyperlipidemia, breast cancer, peripheral neuropathy, anemia and restless leg syndrome, brought to the emergency room following acute onset of numbness involving right upper extremity and distal lower extremity as well as difficulty with being able to control lower extremities with walking. She has no previous history of stroke nor TIA. She has not been on antiplatelet therapy on a routine basis. CT scan of her head showed no acute intracranial abnormality. Old left basal ganglia infarct was noted. Numbness has persisted, involving right upper and lower extremities. She had no facial droop and no apparent change in speech. NIH stroke score was 1.  LSN: 1:00 AM on 10/02/2015 tPA Given: No: Minimal deficits mRankin:  Past Medical History  Diagnosis Date  . Depression   . GERD (gastroesophageal reflux disease)   . Hyperlipidemia   . Breast cancer ([place])     right  . Ovarian cyst   . Parotid tumor     right  . Shingles 2000    Left in V1  . Hx of mitral valve prolapse   . Irregular heart beat   . Restless legs syndrome   . Osteopenia   . Optic neuritis     left  . Pelvic fracture ([place])   . History of GI bleed   . Hiatal hernia   . Anemia     Has had iron infusions  . Depression   . Nocturnal leg cramps 09/02/2015    Past Surgical History  Procedure Laterality Date  . Appendectomy    . Abdominal hysterectomy    . Spine surgery    . Mastectomy Right   . Breast lumpectomy Left   . Total hip arthroplasty Left   . Hernia repair  08/2009  . Esophageal manometry N/A 01/13/2013    Procedure: ESOPHAGEAL MANOMETRY (EM);  Surgeon: Sable Feil, MD;  Location: [facility];  Service: Endoscopy;  Laterality: N/A;  . Cataract extraction      Family History  Problem Relation Age of Onset  . Cancer Mother     Angio sarcoma   .  Heart disease Father   . Hypertension Father   . Stroke Father     x2  . Hernia Brother   . Hypertension Brother   . Heart attack Father     x 11   Social History:  reports that she has never smoked. She has never used smokeless tobacco. She reports that she drinks alcohol. She reports that she does not use illicit drugs.  Allergies:  Allergies  Allergen Reactions  . Diflunisal     REACTION: rash , swelling  anaphlaxsis  . Tetanus Toxoid     REACTION: arm swelling, fever    Medications: Preadmission medications were reviewed by me.  ROS: History obtained from the patient  General ROS: negative for - chills, fatigue, fever, night sweats, weight gain or weight loss Psychological ROS: negative for - behavioral disorder, hallucinations, memory difficulties, mood swings or suicidal ideation Ophthalmic ROS: negative for - blurry vision, double vision, eye pain or loss of vision ENT ROS: negative for - epistaxis, nasal discharge, oral lesions, sore throat, tinnitus or vertigo Allergy and Immunology ROS: negative for - hives or itchy/watery eyes Hematological and Lymphatic ROS: negative for - bleeding problems, bruising or swollen lymph nodes Endocrine ROS: negative for - galactorrhea, hair pattern changes,  polydipsia/polyuria or temperature intolerance Respiratory ROS: negative for - cough, hemoptysis, shortness of breath or wheezing Cardiovascular ROS: negative for - chest pain, dyspnea on exertion, edema or irregular heartbeat Gastrointestinal ROS: negative for - abdominal pain, diarrhea, hematemesis, nausea/vomiting or stool incontinence Genito-Urinary ROS: negative for - dysuria, hematuria, incontinence or urinary frequency/urgency Musculoskeletal ROS: negative for - joint swelling or muscular weakness Neurological ROS: as noted in HPI Dermatological ROS: negative for rash and skin lesion changes  Physical Examination: Blood pressure 148/83, pulse 81, temperature 98.1 F (36.7  C), temperature source Oral, resp. rate 18, SpO2 99 %.  HEENT-  Normocephalic, no lesions, without obvious abnormality.  Normal external eye and conjunctiva.  Normal TM's bilaterally.  Normal auditory canals and external ears. Normal external nose, mucus membranes and septum.  Normal pharynx. Neck supple with no masses, nodes, nodules or enlargement. Cardiovascular - regular rate and rhythm, S1, S2 normal, no murmur, click, rub or gallop Lungs - chest clear, no wheezing, rales, normal symmetric air entry Abdomen - soft, non-tender; bowel sounds normal; no masses,  no organomegaly Extremities - no joint deformities, effusion, or inflammation and no edema  Neurologic Examination: Mental Status: Alert, oriented, no acute distress.  Speech fluent without evidence of aphasia. Able to follow commands without difficulty. Cranial Nerves: II-Visual fields were normal. III/IV/VI-Pupils were equal and reacted normally to light. Extraocular movements were full and conjugate.    V/VII-no facial numbness and no facial weakness. VIII-normal. X-normal speech. XI: trapezius strength/neck flexion strength normal bilaterally XII-midline tongue extension with normal strength. Motor: 5/5 bilaterally with normal tone and bulk Sensory: Reduced perception of tactile sensation over right upper and lower extremities compared to left extremities. Deep Tendon Reflexes: 1+ and symmetric at knees and absent at ankles. Plantars: Flexor bilaterally Cerebellar: Normal finger-to-nose testing. Carotid auscultation: Normal  Results for orders placed or performed during the hospital encounter of 11/01/15 (from the past 48 hour(s))  CBG monitoring, ED     Status: Abnormal   Collection Time: 11/01/15  3:39 AM  Result Value Ref Range   Glucose-Capillary 103 (H) 65 - 99 mg/dL   Comment 1 Notify RN   Protime-INR     Status: None   Collection Time: 11/01/15  3:40 AM  Result Value Ref Range   Prothrombin Time 13.8 11.6 -  15.2 seconds   INR 1.09 0.00 - 1.49  APTT     Status: None   Collection Time: 11/01/15  3:40 AM  Result Value Ref Range   aPTT 26 24 - 37 seconds  CBC     Status: None   Collection Time: 11/01/15  3:40 AM  Result Value Ref Range   WBC 9.2 4.0 - 10.5 K/uL   RBC 4.30 3.87 - 5.11 MIL/uL   Hemoglobin 12.8 12.0 - 15.0 g/dL   HCT 38.5 36.0 - 46.0 %   MCV 89.5 78.0 - 100.0 fL   MCH 29.8 26.0 - 34.0 pg   MCHC 33.2 30.0 - 36.0 g/dL   RDW 13.9 11.5 - 15.5 %   Platelets 224 150 - 400 K/uL  Differential     Status: None   Collection Time: 11/01/15  3:40 AM  Result Value Ref Range   Neutrophils Relative % 71 %   Neutro Abs 6.6 1.7 - 7.7 K/uL   Lymphocytes Relative 19 %   Lymphs Abs 1.8 0.7 - 4.0 K/uL   Monocytes Relative 7 %   Monocytes Absolute 0.6 0.1 - 1.0 K/uL   Eosinophils Relative 2 %  Eosinophils Absolute 0.2 0.0 - 0.7 K/uL   Basophils Relative 1 %   Basophils Absolute 0.1 0.0 - 0.1 K/uL  Comprehensive metabolic panel     Status: Abnormal   Collection Time: 11/01/15  3:40 AM  Result Value Ref Range   Sodium 139 135 - 145 mmol/L   Potassium 3.6 3.5 - 5.1 mmol/L   Chloride 107 101 - 111 mmol/L   CO2 24 22 - 32 mmol/L   Glucose, Bld 99 65 - 99 mg/dL   BUN 22 (H) 6 - 20 mg/dL   Creatinine, Ser 0.61 0.44 - 1.00 mg/dL   Calcium 9.0 8.9 - 10.3 mg/dL   Total Protein 6.8 6.5 - 8.1 g/dL   Albumin 4.1 3.5 - 5.0 g/dL   AST 22 15 - 41 U/L   ALT 18 14 - 54 U/L   Alkaline Phosphatase 74 38 - 126 U/L   Total Bilirubin 0.2 (L) 0.3 - 1.2 mg/dL   GFR calc non Af Amer >60 >60 mL/min   GFR calc Af Amer >60 >60 mL/min    Comment: (NOTE) The eGFR has been calculated using the CKD EPI equation. This calculation has not been validated in all clinical situations. eGFR's persistently <60 mL/min signify possible Chronic Kidney Disease.    Anion gap 8 5 - 15   Ct Head Code Stroke W/o Cm  11/01/2015  CLINICAL DATA:  Code stroke.  Sudden onset of right-sided weakness. EXAM: CT HEAD WITHOUT  CONTRAST TECHNIQUE: Contiguous axial images were obtained from the base of the skull through the vertex without intravenous contrast. COMPARISON:  None. FINDINGS: Brain: Well-defined remote appearing appearing lacunar infarct in the left basal ganglia. No intracranial hemorrhage, mass effect, or midline shift. No hydrocephalus. The basilar cisterns are patent. No evidence of territorial infarct. Mild chronic small vessel ischemia. No intracranial fluid collection. Vascular: No hyperdense vessel or abnormal calcification. Atherosclerosis of skullbase vasculature. Skull:  Calvarium is intact. Sinuses/Orbits: Included paranasal sinuses and mastoid air cells are well aerated. Other: None. IMPRESSION: Remote lacunar infarct in the left basal ganglia. No hemorrhage or CT findings of acute ischemia. These results were called by telephone at the time of interpretation on 11/01/2015 at 4:06 am to Dr. Noemi Chapel , who verbally acknowledged these results. Electronically Signed   By: Jeb Levering M.D.   On: 11/01/2015 04:06    Assessment: 75 y.o. female with multiple risk factors for stroke presenting with probable left subcortical MCA territory TIA. However, a small vessel ischemic stroke cannot be ruled out at this point.  Stroke Risk Factors - family history and hyperlipidemia  Plan: 1. HgbA1c, fasting lipid panel 2. MRI, MRA  of the brain without contrast 3. PT consult, OT consult, Speech consult 4. Echocardiogram 5. Carotid dopplers 6. Prophylactic therapy-Antiplatelet med: Aspirin  7. Risk factor modification 8. Telemetry monitoring  C.R. Nicole Kindred, MD [facility] [phone]  11/01/2015, 4:41 AM

## 2015-11-01 NOTE — Progress Notes (Signed)
PT Cancellation Note  Patient Details Name: Shelley Coleman MRN: [id number] DOB: December 25, 1940   Cancelled Treatment:    Reason Eval/Treat Not Completed: Patient at procedure or test/unavailable MD in room and transport outside room with w/c ready to take pt down for test. Will follow up as time allows.   Marguarite Arbour A Lasharon Dunivan 11/01/2015, 1:31 PM Wray Kearns, [place], DPT [phone]

## 2015-11-01 NOTE — Evaluation (Signed)
Occupational Therapy Evaluation Patient Details Name: TREYONNA BUQUET MRN: [id number] DOB: 28-Feb-1941 Today's Date: 11/01/2015    History of Present Illness 75 yo female admitted with R UE R LE numbness and difficulty walking. CT (+) L basal ganglia PMH: depression, GERD, R Breast CA, hx mitral valve prolapse optic neuritis, restless legs, spine surg, L hip surg, peripheral neuropathy   Clinical Impression   PT admitted with L basal ganglia infarct. Pt currently with functional limitiations due to the deficits listed below (see OT problem list). PTA was independent with all adls.  Pt will benefit from skilled OT to increase their independence and safety with adls and balance to allow discharge outpatient. Pt with balance deficits that affect all aspects of adls.  Pt's spouse passed about year ago and reports it has been difficult adjusting. Daughter present and able to help patient upon dc. Next OT sessions to focus on balance deficits.      Follow Up Recommendations  Outpatient OT;Supervision/Assistance - 24 hour    Equipment Recommendations  None recommended by OT    Recommendations for Other Services       Precautions / Restrictions Precautions Precautions: Fall      Mobility Bed Mobility Overal bed mobility: Independent                Transfers Overall transfer level: Needs assistance   Transfers: Sit to/from Stand Sit to Stand: Supervision              Balance Overall balance assessment: Needs assistance Sitting-balance support: Bilateral upper extremity supported;Feet supported Sitting balance-Leahy Scale: Good     Standing balance support: No upper extremity supported;During functional activity Standing balance-Leahy Scale: Good                              ADL Overall ADL's : Needs assistance/impaired Eating/Feeding: Independent   Grooming: Wash/dry hands;Supervision/safety;Standing               Lower Body Dressing: Min  guard;Sit to/from stand   Toilet Transfer: Min guard;Ambulation   Toileting- Clothing Manipulation and Hygiene: Min guard;Sit to/from stand       Functional mobility during ADLs: Min guard General ADL Comments: Pt reports dizzines and nausea with ambulation. pt needs incr time and stopping for rest breaks due to dizziness     Vision Vision Assessment?: No apparent visual deficits   Perception     Praxis      Pertinent Vitals/Pain Pain Assessment: No/denies pain     Hand Dominance Left   Extremity/Trunk Assessment Upper Extremity Assessment Upper Extremity Assessment: RUE deficits/detail RUE Deficits / Details: ataxic movement, AROM WFL, proprioception impaired RUE Sensation: decreased proprioception   Lower Extremity Assessment Lower Extremity Assessment: Defer to PT evaluation;RLE deficits/detail RLE Deficits / Details: "doesnt go exactly where I want it to go "   Cervical / Trunk Assessment Cervical / Trunk Assessment: Other exceptions (hx back surg)   Communication Communication Communication: No difficulties   Cognition Arousal/Alertness: Awake/alert Behavior During Therapy: WFL for tasks assessed/performed Overall Cognitive Status: Within Functional Limits for tasks assessed                     General Comments       Exercises       Shoulder Instructions      Home Living Family/patient expects to be discharged to:: Private residence Living Arrangements: Alone Available Help at Discharge: Family;Available 24  hours/day Type of Home: House Home Access: Stairs to enter [employer] of Steps: 1 (normally goes into garage level entry)   Home Layout: One level     Bathroom Shower/Tub: Occupational psychologist: Standard     Home Equipment: None          Prior Functioning/Environment Level of Independence: Independent        Comments: drives, volunteers at church, takes care of pets, enjoys yard work    OT  Diagnosis: Generalized weakness;Ataxia   OT Problem List: Decreased strength;Decreased activity tolerance;Impaired balance (sitting and/or standing);Decreased safety awareness;Decreased knowledge of use of DME or AE;Decreased knowledge of precautions   OT Treatment/Interventions: Self-care/ADL training;Therapeutic exercise;Neuromuscular education;DME and/or AE instruction;Therapeutic activities;Patient/family education;Balance training    OT Goals(Current goals can be found in the care plan section) Acute Rehab OT Goals Patient Stated Goal: to return to independence OT Goal Formulation: With patient Time For Goal Achievement: 11/15/15 Potential to Achieve Goals: Good  OT Frequency: Min 2X/week   Barriers to D/C:            Co-evaluation              End of Session Equipment Utilized During Treatment: Gait belt Nurse Communication: Mobility status;Precautions  Activity Tolerance: Patient tolerated treatment well Patient left: in chair;with call bell/phone within reach;with family/visitor present   Time: 0728-0755 OT Time Calculation (min): 27 min Charges:  OT General Charges $OT Visit: 1 Procedure OT Evaluation $OT Eval Moderate Complexity: 1 Procedure OT Treatments $Self Care/Home Management : 8-22 mins G-Codes:    Peri Maris November 06, 2015, 8:14 AM    Jeri Modena   OTR/L Pager: [phone] Office: [phone] .

## 2015-11-02 ENCOUNTER — Observation Stay (HOSPITAL_BASED_OUTPATIENT_CLINIC_OR_DEPARTMENT_OTHER): Payer: Medicare Other

## 2015-11-02 DIAGNOSIS — R202 Paresthesia of skin: Secondary | ICD-10-CM | POA: Diagnosis not present

## 2015-11-02 DIAGNOSIS — F411 Generalized anxiety disorder: Secondary | ICD-10-CM | POA: Insufficient documentation

## 2015-11-02 DIAGNOSIS — I639 Cerebral infarction, unspecified: Secondary | ICD-10-CM | POA: Diagnosis not present

## 2015-11-02 DIAGNOSIS — R200 Anesthesia of skin: Secondary | ICD-10-CM | POA: Diagnosis not present

## 2015-11-02 DIAGNOSIS — G43109 Migraine with aura, not intractable, without status migrainosus: Secondary | ICD-10-CM | POA: Insufficient documentation

## 2015-11-02 LAB — COMPREHENSIVE METABOLIC PANEL
ALBUMIN: 3.3 g/dL — AB (ref 3.5–5.0)
ALK PHOS: 67 U/L (ref 38–126)
ALT: 16 U/L (ref 14–54)
ANION GAP: 7 (ref 5–15)
AST: 18 U/L (ref 15–41)
BILIRUBIN TOTAL: 0.6 mg/dL (ref 0.3–1.2)
BUN: 15 mg/dL (ref 6–20)
CALCIUM: 9.1 mg/dL (ref 8.9–10.3)
CO2: 25 mmol/L (ref 22–32)
Chloride: 107 mmol/L (ref 101–111)
Creatinine, Ser: 0.67 mg/dL (ref 0.44–1.00)
GFR calc non Af Amer: >60 mL/min (ref >60)
GLUCOSE: 96 mg/dL (ref 65–99)
Potassium: 3.7 mmol/L (ref 3.5–5.1)
Sodium: 139 mmol/L (ref 135–145)
TOTAL PROTEIN: 5.8 g/dL — AB (ref 6.5–8.1)

## 2015-11-02 LAB — CBC
HEMATOCRIT: 39.2 % (ref 36.0–46.0)
HEMOGLOBIN: 12.5 g/dL (ref 12.0–15.0)
MCH: 29 pg (ref 26.0–34.0)
MCHC: 31.9 g/dL (ref 30.0–36.0)
MCV: 91 fL (ref 78.0–100.0)
Platelets: 202 10*3/uL (ref 150–400)
RBC: 4.31 MIL/uL (ref 3.87–5.11)
RDW: 13.7 % (ref 11.5–15.5)
WBC: 5.2 10*3/uL (ref 4.0–10.5)

## 2015-11-02 LAB — HEMOGLOBIN A1C
Hgb A1c MFr Bld: 5.3 % (ref 4.8–5.6)
MEAN PLASMA GLUCOSE: 105 mg/dL

## 2015-11-02 MED ORDER — ATORVASTATIN CALCIUM 20 MG PO TABS: 20.0000 mg | ORAL_TABLET | Freq: Every day | ORAL | Status: DC

## 2015-11-02 MED ORDER — ASPIRIN 325 MG PO TABS: 325.0000 mg | ORAL_TABLET | Freq: Every day | ORAL | Status: DC

## 2015-11-02 NOTE — Discharge Summary (Addendum)
Physician Discharge Summary  Shelley Coleman MRN: [id number] DOB/AGE: 73-Nov-1942 73 y.o.  PCP: Dorothyann Peng, NP   Admit date: 11/01/2015 Discharge date: 11/02/2015  Discharge Diagnoses:     Principal Problem: Probable TIA   Intermittent tingling of right hand and foot   HLD (hyperlipidemia)   RLS (restless legs syndrome)   Right arm numbness    Follow-up recommendations Follow-up with PCP in 3-5 days , including all  additional recommended appointments as below Follow-up CBC, CMP in 3-5 days  Follow-up with Dr. Jannifer Franklin at [facility]      Current Discharge Medication List    START taking these medications   Details  aspirin 325 MG tablet Take 1 tablet (325 mg total) by mouth daily. Qty: 30 tablet, Refills: 0    atorvastatin (LIPITOR) 20 MG tablet Take 1 tablet (20 mg total) by mouth daily at 6 PM. Qty: 30 tablet, Refills: 0      CONTINUE these medications which have NOT CHANGED   Details  ALPRAZolam (XANAX) 1 MG tablet TAKE ONE TABLET BY MOUTH AT BEDTIME Qty: 30 tablet, Refills: 0    atenolol (TENORMIN) 50 MG tablet Take 50 mg by mouth daily as needed (for blood pressure).     buPROPion (WELLBUTRIN SR) 150 MG 12 hr tablet Take 1 tablet (150 mg total) by mouth 2 (two) times daily. Qty: 180 tablet, Refills: 3   Associated Diagnoses: Depression; Sleep disturbance    DEXILANT 60 MG capsule TAKE ONE CAPSULE BY MOUTH ONCE DAILY. Qty: 90 capsule, Refills: 0    HYDROcodone-acetaminophen (NORCO) 7.5-325 MG tablet Take 1 tablet by mouth every 6 (six) hours as needed. Qty: 60 tablet, Refills: 0    MELATONIN PO Take 1 tablet by mouth at bedtime as needed (for sleep).     Multiple Vitamin (MULTIVITAMIN) tablet Take 1 tablet by mouth daily.     NITROSTAT 0.4 MG SL tablet PLACE ONE TABLET UNDER THE TONGUE EVERY 5 MINUTES AS NEEDED. Qty: 100 tablet, Refills: 0    !! rOPINIRole (REQUIP) 1 MG tablet One tablet at 5 pm, one tablet at bed time Qty: 180 tablet, Refills: 1    !!  rOPINIRole (REQUIP) 3 MG tablet TAKE ONE TABLET BY MOUTH ONCE DAILY AT BEDTIME Qty: 90 tablet, Refills: 0     !! - Potential duplicate medications found. Please discuss with provider.    STOP taking these medications     ibuprofen (ADVIL,MOTRIN) 200 MG tablet          Discharge Condition: Stable Discharge Instructions Get Medicines reviewed and adjusted: Please take all your medications with you for your next visit with your Primary MD  Please request your Primary MD to go over all hospital tests and procedure/radiological results at the follow up, please ask your Primary MD to get all Hospital records sent to his/her office.  If you experience worsening of your admission symptoms, develop shortness of breath, life threatening emergency, suicidal or homicidal thoughts you must seek medical attention immediately by calling 911 or calling your MD immediately if symptoms less severe.  You must read complete instructions/literature along with all the possible adverse reactions/side effects for all the Medicines you take and that have been prescribed to you. Take any new Medicines after you have completely understood and accpet all the possible adverse reactions/side effects.   Do not drive when taking Pain medications.   Do not take more than prescribed Pain, Sleep and Anxiety Medications  Special Instructions: If you have smoked or  chewed Tobacco in the last 2 yrs please stop smoking, stop any regular Alcohol and or any Recreational drug use.  Wear Seat belts while driving.  Please note  You were cared for by a hospitalist during your hospital stay. Once you are discharged, your primary care physician will handle any further medical issues. Please note that NO REFILLS for any discharge medications will be authorized once you are discharged, as it is imperative that you return to your primary care physician (or establish a relationship with a primary care physician if you do not have  one) for your aftercare needs so that they can reassess your need for medications and monitor your lab values.     Allergies  Allergen Reactions  . Diflunisal     REACTION: rash , swelling  anaphlaxsis  . Tetanus Toxoid     REACTION: arm swelling, fever      Disposition: Home with home health   Consults:  Neurology     Significant Diagnostic Studies:  Dg Chest 2 View  11/01/2015  CLINICAL DATA:  Right-sided weakness, leg spasms and right-sided chest pain. History of breast cancer. EXAM: CHEST  2 VIEW COMPARISON:  09/09/2009 FINDINGS: The heart is normal in size. Stable tortuosity of the thoracic aorta. The pulmonary hila appear normal. Significant chronic bronchitic interstitial changes but no infiltrates, edema or effusions. Small hiatal hernia. The bony thorax is intact. Stable degenerative changes involving the thoracic spine and thoracolumbar scoliosis. IMPRESSION: Chronic bronchitic type interstitial lung changes but no acute overlying pulmonary process. Electronically Signed   By: Marijo Sanes M.D.   On: 11/01/2015 10:48   [name] Contrast  11/01/2015  CLINICAL DATA:  75 year old female with new onset right side numbness and difficulty walking. Sudden onset right side weakness, presenting as code stroke this morning. Initial encounter. EXAM: MRI HEAD WITHOUT CONTRAST MRA HEAD WITHOUT CONTRAST TECHNIQUE: Multiplanar, multiecho pulse sequences of the brain and surrounding structures were obtained without intravenous contrast. Angiographic images of the head were obtained using MRA technique without contrast. COMPARISON:  Head CT without contrast 0353 hours today. FINDINGS: MRI HEAD FINDINGS Major intracranial vascular flow voids are preserved. No restricted diffusion to suggest acute infarction. No midline shift, mass effect, evidence of mass lesion, ventriculomegaly, extra-axial collection or acute intracranial hemorrhage. Cervicomedullary junction and pituitary are within normal  limits. Negative visualized cervical spine. Chronic lacunar infarcts in the left basal ganglia. Mild to moderate for age nonspecific patchy bilateral cerebral white matter T2 and FLAIR hyperintensity. No cortical encephalomalacia or chronic cerebral blood products are identified. Mild T2 heterogeneity in the dorsal right thalamus which might also be a small chronic lacune. Mild patchy T2 hyperintensity in the pons. The remainder the brainstem an the cerebellum appear normal. Visible internal auditory structures appear normal. Mastoids are clear. Trace paranasal sinus mucosal thickening. Postoperative changes to the globes. Otherwise negative orbit and scalp soft tissues. Normal bone marrow signal. MRA HEAD FINDINGS Antegrade flow in the posterior circulation with codominant distal vertebral arteries. No distal vertebral stenosis. Normal PICA origins and vertebrobasilar junction. No basilar stenosis. SCA and PCA origins appear normal. Posterior communicating arteries are diminutive or absent. Tortuous left P1 segment. Bilateral PCA branches are within normal limits. Antegrade flow in both ICA siphons. Mild siphon irregularity in keeping with atherosclerosis, but no siphon stenosis is identified. There are small infundibula at both carotid termini. Ophthalmic artery origins appear normal. Normal MCA and ACA origins. Anterior communicating artery appears to be present. Visible ACA branches are  within normal limits. Left MCA M1 segment, bifurcation, and visible left MCA branches are within normal limits. The right MCA M1 is duplicated (normal variant) as seen on series 704, image 11. Visible right MCA branches are within normal limits. IMPRESSION: 1. Evidence of chronic small vessel disease but no acute infarct or acute intracranial abnormality identified. 2. Evidence of ICA siphon atherosclerosis but no associated stenosis, and otherwise negative intracranial MRA. Electronically Signed   By: Genevie Ann M.D.   On:  11/01/2015 11:49   Mr Shelley Coleman Head/brain Wo Cm  11/01/2015  CLINICAL DATA:  75 year old female with new onset right side numbness and difficulty walking. Sudden onset right side weakness, presenting as code stroke this morning. Initial encounter. EXAM: MRI HEAD WITHOUT CONTRAST MRA HEAD WITHOUT CONTRAST TECHNIQUE: Multiplanar, multiecho pulse sequences of the brain and surrounding structures were obtained without intravenous contrast. Angiographic images of the head were obtained using MRA technique without contrast. COMPARISON:  Head CT without contrast 0353 hours today. FINDINGS: MRI HEAD FINDINGS Major intracranial vascular flow voids are preserved. No restricted diffusion to suggest acute infarction. No midline shift, mass effect, evidence of mass lesion, ventriculomegaly, extra-axial collection or acute intracranial hemorrhage. Cervicomedullary junction and pituitary are within normal limits. Negative visualized cervical spine. Chronic lacunar infarcts in the left basal ganglia. Mild to moderate for age nonspecific patchy bilateral cerebral white matter T2 and FLAIR hyperintensity. No cortical encephalomalacia or chronic cerebral blood products are identified. Mild T2 heterogeneity in the dorsal right thalamus which might also be a small chronic lacune. Mild patchy T2 hyperintensity in the pons. The remainder the brainstem an the cerebellum appear normal. Visible internal auditory structures appear normal. Mastoids are clear. Trace paranasal sinus mucosal thickening. Postoperative changes to the globes. Otherwise negative orbit and scalp soft tissues. Normal bone marrow signal. MRA HEAD FINDINGS Antegrade flow in the posterior circulation with codominant distal vertebral arteries. No distal vertebral stenosis. Normal PICA origins and vertebrobasilar junction. No basilar stenosis. SCA and PCA origins appear normal. Posterior communicating arteries are diminutive or absent. Tortuous left P1 segment. Bilateral PCA  branches are within normal limits. Antegrade flow in both ICA siphons. Mild siphon irregularity in keeping with atherosclerosis, but no siphon stenosis is identified. There are small infundibula at both carotid termini. Ophthalmic artery origins appear normal. Normal MCA and ACA origins. Anterior communicating artery appears to be present. Visible ACA branches are within normal limits. Left MCA M1 segment, bifurcation, and visible left MCA branches are within normal limits. The right MCA M1 is duplicated (normal variant) as seen on series 704, image 11. Visible right MCA branches are within normal limits. IMPRESSION: 1. Evidence of chronic small vessel disease but no acute infarct or acute intracranial abnormality identified. 2. Evidence of ICA siphon atherosclerosis but no associated stenosis, and otherwise negative intracranial MRA. Electronically Signed   By: Genevie Ann M.D.   On: 11/01/2015 11:49   Ct Head Code Stroke W/o Cm  11/01/2015  CLINICAL DATA:  Code stroke.  Sudden onset of right-sided weakness. EXAM: CT HEAD WITHOUT CONTRAST TECHNIQUE: Contiguous axial images were obtained from the base of the skull through the vertex without intravenous contrast. COMPARISON:  None. FINDINGS: Brain: Well-defined remote appearing appearing lacunar infarct in the left basal ganglia. No intracranial hemorrhage, mass effect, or midline shift. No hydrocephalus. The basilar cisterns are patent. No evidence of territorial infarct. Mild chronic small vessel ischemia. No intracranial fluid collection. Vascular: No hyperdense vessel or abnormal calcification. Atherosclerosis of skullbase vasculature. Skull:  Calvarium is intact. Sinuses/Orbits: Included paranasal sinuses and mastoid air cells are well aerated. Other: None. IMPRESSION: Remote lacunar infarct in the left basal ganglia. No hemorrhage or CT findings of acute ischemia. These results were called by telephone at the time of interpretation on 11/01/2015 at 4:06 am to Dr.  Noemi Chapel , who verbally acknowledged these results. Electronically Signed   By: Jeb Levering M.D.   On: 11/01/2015 04:06    2-D echo  LV EF: 55% - 60%  ------------------------------------------------------------------- Indications: CVA 436.  ------------------------------------------------------------------- History: Risk factors: Dyslipidemia.  ------------------------------------------------------------------- Study Conclusions  - Left ventricle: The cavity size was normal. There was mild focal  basal hypertrophy of the septum. Systolic function was normal.  The estimated ejection fraction was in the range of 55% to 60%.  Wall motion was normal; there were no regional wall motion  abnormalities. Doppler parameters are consistent with abnormal  left ventricular relaxation (grade 1 diastolic dysfunction).  Impressions:  - Normal LV systolic function; grade 1 diastolic dysfunction;  trace TR.  There were no vitals filed for this visit.   Microbiology: No results found for this or any previous visit (from the past 240 hour(s)).     Blood Culture    Component Value Date/Time   SDES URINE, CLEAN CATCH 09/10/2009 0037   SPECREQUEST IMMUNE:NORM UT SYMPT:POS 09/10/2009 0037   CULT ESCHERICHIA COLI 09/10/2009 0037   REPTSTATUS 09/11/2009 FINAL 09/10/2009 0037      Labs: Results for orders placed or performed during the hospital encounter of 11/01/15 (from the past 48 hour(s))  CBG monitoring, ED     Status: Abnormal   Collection Time: 11/01/15  3:39 AM  Result Value Ref Range   Glucose-Capillary 103 (H) 65 - 99 mg/dL   Comment 1 Notify RN   Protime-INR     Status: None   Collection Time: 11/01/15  3:40 AM  Result Value Ref Range   Prothrombin Time 13.8 11.6 - 15.2 seconds   INR 1.09 0.00 - 1.49  APTT     Status: None   Collection Time: 11/01/15  3:40 AM  Result Value Ref Range   aPTT 26 24 - 37 seconds  CBC     Status: None    Collection Time: 11/01/15  3:40 AM  Result Value Ref Range   WBC 9.2 4.0 - 10.5 K/uL   RBC 4.30 3.87 - 5.11 MIL/uL   Hemoglobin 12.8 12.0 - 15.0 g/dL   HCT 38.5 36.0 - 46.0 %   MCV 89.5 78.0 - 100.0 fL   MCH 29.8 26.0 - 34.0 pg   MCHC 33.2 30.0 - 36.0 g/dL   RDW 13.9 11.5 - 15.5 %   Platelets 224 150 - 400 K/uL  Differential     Status: None   Collection Time: 11/01/15  3:40 AM  Result Value Ref Range   Neutrophils Relative % 71 %   Neutro Abs 6.6 1.7 - 7.7 K/uL   Lymphocytes Relative 19 %   Lymphs Abs 1.8 0.7 - 4.0 K/uL   Monocytes Relative 7 %   Monocytes Absolute 0.6 0.1 - 1.0 K/uL   Eosinophils Relative 2 %   Eosinophils Absolute 0.2 0.0 - 0.7 K/uL   Basophils Relative 1 %   Basophils Absolute 0.1 0.0 - 0.1 K/uL  Comprehensive metabolic panel     Status: Abnormal   Collection Time: 11/01/15  3:40 AM  Result Value Ref Range   Sodium 139 135 - 145 mmol/L   Potassium 3.6  3.5 - 5.1 mmol/L   Chloride 107 101 - 111 mmol/L   CO2 24 22 - 32 mmol/L   Glucose, Bld 99 65 - 99 mg/dL   BUN 22 (H) 6 - 20 mg/dL   Creatinine, Ser 0.61 0.44 - 1.00 mg/dL   Calcium 9.0 8.9 - 10.3 mg/dL   Total Protein 6.8 6.5 - 8.1 g/dL   Albumin 4.1 3.5 - 5.0 g/dL   AST 22 15 - 41 U/L   ALT 18 14 - 54 U/L   Alkaline Phosphatase 74 38 - 126 U/L   Total Bilirubin 0.2 (L) 0.3 - 1.2 mg/dL   GFR calc non Af Amer >60 >60 mL/min   GFR calc Af Amer >60 >60 mL/min    Comment: (NOTE) The eGFR has been calculated using the CKD EPI equation. This calculation has not been validated in all clinical situations. eGFR's persistently <60 mL/min signify possible Chronic Kidney Disease.    Anion gap 8 5 - 15  Hemoglobin A1c     Status: None   Collection Time: 11/01/15  5:08 AM  Result Value Ref Range   Hgb A1c MFr Bld 5.3 4.8 - 5.6 %    Comment: (NOTE)         Pre-diabetes: 5.7 - 6.4         Diabetes: >6.4         Glycemic control for adults with diabetes: <7.0    Mean Plasma Glucose 105 mg/dL    Comment:  (NOTE) Performed At: [facility] [place], [place] [id number] Shelley Romp MD ZH:[id number]   Lipid panel     Status: Abnormal   Collection Time: 11/01/15  5:08 AM  Result Value Ref Range   Cholesterol 247 (H) 0 - 200 mg/dL   Triglycerides 114 <150 mg/dL   HDL 61 >40 mg/dL   Total CHOL/HDL Ratio 4.0 RATIO   VLDL 23 0 - 40 mg/dL   LDL Cholesterol 163 (H) 0 - 99 mg/dL    Comment:        Total Cholesterol/HDL:CHD Risk Coronary Heart Disease Risk Table                     Men   Women  1/2 Average Risk   3.4   3.3  Average Risk       5.0   4.4  2 X Average Risk   9.6   7.1  3 X Average Risk  23.4   11.0        Use the calculated Patient Ratio above and the CHD Risk Table to determine the patient's CHD Risk.        ATP III CLASSIFICATION (LDL):  <100     mg/dL   Optimal  100-129  mg/dL   Near or Above                    Optimal  130-159  mg/dL   Borderline  160-189  mg/dL   High  >190     mg/dL   Very High   TSH     Status: None   Collection Time: 11/01/15 12:04 PM  Result Value Ref Range   TSH 4.163 0.350 - 4.500 uIU/mL  CBC     Status: None   Collection Time: 11/02/15  5:24 AM  Result Value Ref Range   WBC 5.2 4.0 - 10.5 K/uL   RBC 4.31 3.87 - 5.11 MIL/uL   Hemoglobin 12.5 12.0 -  15.0 g/dL   HCT 39.2 36.0 - 46.0 %   MCV 91.0 78.0 - 100.0 fL   MCH 29.0 26.0 - 34.0 pg   MCHC 31.9 30.0 - 36.0 g/dL   RDW 13.7 11.5 - 15.5 %   Platelets 202 150 - 400 K/uL  Comprehensive metabolic panel     Status: Abnormal   Collection Time: 11/02/15  5:24 AM  Result Value Ref Range   Sodium 139 135 - 145 mmol/L   Potassium 3.7 3.5 - 5.1 mmol/L   Chloride 107 101 - 111 mmol/L   CO2 25 22 - 32 mmol/L   Glucose, Bld 96 65 - 99 mg/dL   BUN 15 6 - 20 mg/dL   Creatinine, Ser 0.67 0.44 - 1.00 mg/dL   Calcium 9.1 8.9 - 10.3 mg/dL   Total Protein 5.8 (L) 6.5 - 8.1 g/dL   Albumin 3.3 (L) 3.5 - 5.0 g/dL   AST 18 15 - 41 U/L   ALT 16 14 - 54 U/L   Alkaline  Phosphatase 67 38 - 126 U/L   Total Bilirubin 0.6 0.3 - 1.2 mg/dL   GFR calc non Af Amer >60 >60 mL/min   GFR calc Af Amer >60 >60 mL/min    Comment: (NOTE) The eGFR has been calculated using the CKD EPI equation. This calculation has not been validated in all clinical situations. eGFR's persistently <60 mL/min signify possible Chronic Kidney Disease.    Anion gap 7 5 - 15     Lipid Panel     Component Value Date/Time   CHOL 247* 11/01/2015 0508   TRIG 114 11/01/2015 0508   HDL 61 11/01/2015 0508   CHOLHDL 4.0 11/01/2015 0508   VLDL 23 11/01/2015 0508   LDLCALC 163* 11/01/2015 0508   LDLDIRECT 168.5 02/22/2012 0853     Lab Results  Component Value Date   HGBA1C 5.3 11/01/2015        HPI :  75 y.o. female with a history of hyperlipidemia, breast cancer, peripheral neuropathy, anemia and restless leg syndrome, brought to the emergency room following acute onset of numbness involving right upper extremity and distal lower extremity as well as difficulty with being able to control lower extremities with walking. She has no previous history of stroke nor TIA. She has not been on antiplatelet therapy on a routine basis. CT scan of her head showed no acute intracranial abnormality. Old left basal ganglia infarct was noted. Numbness has persisted, involving right upper and lower extremities. She had no facial droop and no apparent change in speech. NIH stroke score was 1. She was LKW at 1:00 AM on 10/02/2015. Patient was not administered IV t-PA secondary to minimal deficits. She was admitted for further evaluation and treatment. Patient also had some right hand, wrist and right foot tingling comes and goes. She admitted that she has bad headache today after the tingling getting worse  HOSPITAL COURSE:   Right hand and leg tingling - could be related to HA or anxiety/depression, NO acute infarct on MRI so far although pt does have stroke risk factors with HLD and old right caudate  head infarct on CT/MRI. Low suspicious for seizure.  Resultant Subjective right hand/leg tingling  MRI No acute infarct  MRA ICA siphon atherosclerosis but no significant stenosis  Carotid Doppler 1-39% ICA plaquing. Vertebral artery flow is antegrade  2D Echo EF 55-60%   EEG  within normal limits  LDL 163  HgbA1c 5.3  Regular diet  No antithrombotic  prior to admission, now on aspirin 325 mg daily.   Physical therapy recommended outpatient therapy however patient unable to go to her outpatient appointment therefore will arrange for home health  Patient ok to be discharged by Dr. Erlinda Hong  Hypertensive  No hx HTN-PCP to adjust antihypertensive therapy based on ambulating blood pressure monitoring  Max 171/85 noted in the ER   Hyperlipidemia  Home meds: No statin  LDL 163, goal < 70  Started lipitor 20  Continue statin at discharge  Other Stroke Risk Factors  Advanced age  ETOH use, advised to drink no more than 1 drink(s) a day  Family hx stroke (father x 2) and HLD  Other Active Problems  Depression - on wellbutrine  GERD   R breast cancer s/p Sx  R parotid tumor  RLS on requip - following with Dr. Jannifer Franklin at [facility] , follow-up with [facility] at discharge   Discharge Exam:   Blood pressure 154/74, pulse 76, temperature 97.9 F (36.6 C), temperature source Oral, resp. rate 20, SpO2 99 %. Respiratory: clear to auscultation bilaterally, no wheezing, no crackles. Normal respiratory effort. No accessory muscle use.  Cardiovascular: Regular rate and rhythm, no murmurs / rubs / gallops. No extremity edema. 2+ pedal pulses. No carotid bruits.  Abdomen: no tenderness, no masses palpated. No hepatosplenomegaly. Bowel sounds positive.  Musculoskeletal: no clubbing / cyanosis. No joint deformity upper and lower extremities. Good ROM, no contractures. Normal muscle tone.  Skin: no rashes, lesions, ulcers. No induration Neurologic: CN 2-12 grossly intact.  Sensation reduced on R extremities compared to L, DTR normal. Strength 5/5 in all 4.  Psychiatric: Normal judgment and insight. Alert and oriented x 3. Normal mood.      Follow-up Information    Schedule an appointment as soon as possible for a visit with Dorothyann Peng, NP.   Specialty:  Family Medicine   Why:  Post hospital follow-up   Contact information:   [place] [address] [phone]       Follow up with Lenor Coffin, MD. Schedule an appointment as soon as possible for a visit in 1 week.   Specialty:  Neurology   Why:  Hospital follow-up   Contact information:   [address] [place] [address] [phone]       Signed: Reyne Dumas 11/02/2015, 10:56 AM        Time spent >45 mins

## 2015-11-02 NOTE — Progress Notes (Signed)
Patient is discharged from room 5C09 at this time. Alert and in stable condition. IV site d/c'd as well as tele. Instructions read to patient with understanding verbalized. Left unit via wheelchair with all belongings at side.

## 2015-11-02 NOTE — Progress Notes (Signed)
Physical Therapy Treatment Patient Details Name: Shelley Coleman MRN: [id number] DOB: December 06, 1940 Today's Date: 11/02/2015    History of Present Illness 75 yo female admitted with R UE R LE numbness and difficulty walking. CT-remote L basal ganglia PMH: depression, GERD, R Breast CA, hx mitral valve prolapse optic neuritis, restless legs, spine surg, L hip surg, peripheral neuropathy    PT Comments    Patient progressing well towards PT goals but continues to demonstrate balance deficits esp with higher level balance activities. Veering to the right at times but able to self correct with cues. Pt score 17/24 on DGI putting pt at increased risk for falls. Tolerated stair training with supervision for safety. Lengthy discussion about safety at home and activity. Would benefit from OPPT for balance training. Will follow.  Follow Up Recommendations        Equipment Recommendations       Recommendations for Other Services       Precautions / Restrictions Precautions Precautions: Fall Restrictions Weight Bearing Restrictions: No    Mobility  Bed Mobility Overal bed mobility: Independent             General bed mobility comments: Able to get to EOB without assist. Reports heavy/funny feeling in head.  Transfers Overall transfer level: Needs assistance Equipment used: None Transfers: Sit to/from Stand Sit to Stand: Supervision         General transfer comment: Supervision for safety.   Ambulation/Gait Ambulation/Gait assistance: Min assist;Min guard Ambulation Distance (Feet): 250 Feet Assistive device: None Gait Pattern/deviations: Step-through pattern;Decreased stride length;Staggering right Gait velocity: decreased Gait velocity interpretation: <1.8 ft/sec, indicative of risk for recurrent falls General Gait Details: veering right at times and some staggering esp with balance challenges- head turn to the right. No knee instability or ankle instability noted.     Stairs Stairs: Yes Stairs assistance: Supervision Stair Management: Alternating pattern;Step to pattern;One rail Right Number of Stairs: 3 (+ 2 steps x2 bouts) General stair comments: Cues for technique and safety.  Wheelchair Mobility    Modified Rankin (Stroke Patients Only) Modified Rankin (Stroke Patients Only) Pre-Morbid Rankin Score: No symptoms Modified Rankin: Moderately severe disability     Balance Overall balance assessment: Needs assistance Sitting-balance support: No upper extremity supported;Feet supported Sitting balance-Leahy Scale: Good Sitting balance - Comments: able to donn pants in sitting without difficulty.   Standing balance support: During functional activity Standing balance-Leahy Scale: Fair       Tandem Stance - Right Leg: 10 (not able to tolerate with eyes closed; LOB) Tandem Stance - Left Leg: 20 Rhomberg - Eyes Opened: 30 Rhomberg - Eyes Closed: 30 (sway noted) High level balance activites: Direction changes;Turns;Sudden stops;Head turns High Level Balance Comments: Tolerated above with deviations in gait- staggering to the right but no overt LOB. Reports dizziness with 360 degree turn.     Cognition Arousal/Alertness: Awake/alert Behavior During Therapy: WFL for tasks assessed/performed Overall Cognitive Status: Within Functional Limits for tasks assessed Area of Impairment: Awareness           Awareness: Anticipatory   General Comments: Pt reports plans to walk in nature trails located in park across the street from home. Pt lacks awareness to balance deficits. Pt asking therapist about being her therapist last night. pt reports you told me to look at my feet when I walk -- after therapist educated to keep head up with ambulation.     Exercises      General Comments General comments (skin integrity, edema,  etc.): Daughter present in room during session.       Pertinent Vitals/Pain Pain Assessment: Faces Faces Pain Scale:  Hurts even more Pain Location: headache  Pain Descriptors / Indicators: Headache Pain Intervention(s): Monitored during session;Patient requesting pain meds-RN notified;Repositioned    Home Living                      Prior Function            PT Goals (current goals can now be found in the care plan section) Acute Rehab PT Goals Patient Stated Goal: to return to independence Progress towards PT goals: Progressing toward goals    Frequency       PT Plan      Co-evaluation             End of Session           Time: [id number] PT Time Calculation (min) (ACUTE ONLY): 35 min  Charges:  $Gait Training: 8-22 mins $Neuromuscular Re-education: 8-22 mins                    G Codes:      Kaislee Chao A Harun Brumley 11/02/2015, 10:44 AM  Wray Kearns, PT, DPT [phone]

## 2015-11-02 NOTE — Progress Notes (Signed)
STROKE TEAM PROGRESS NOTE   SUBJECTIVE (INTERVAL HISTORY) Her daughter is at the bedside.  Her right sided numbness much improved, near baseline. She still feel some difference comparing with left but can not describe the feeling. EEG no seizure and CUS unremarkable. Still has mild HA, tylenol not effective.   OBJECTIVE Temp:  [97.9 F (36.6 C)-98.6 F (37 C)] 97.9 F (36.6 C) (07/04 0943) Pulse Rate:  [70-76] 76 (07/04 0943) Cardiac Rhythm:  [-] Normal sinus rhythm (07/04 0700) Resp:  [18-20] 20 (07/04 0943) BP: (123-154)/(58-89) 154/74 mmHg (07/04 0943) SpO2:  [94 %-99 %] 99 % (07/04 0943)  CBC:   Recent Labs Lab 11/01/15 0340 11/02/15 0524  WBC 9.2 5.2  NEUTROABS 6.6  --   HGB 12.8 12.5  HCT 38.5 39.2  MCV 89.5 91.0  PLT 224 [id number]    Basic Metabolic Panel:   Recent Labs Lab 11/01/15 0340 11/02/15 0524  NA 139 139  K 3.6 3.7  CL 107 107  CO2 24 25  GLUCOSE 99 96  BUN 22* 15  CREATININE 0.61 0.67  CALCIUM 9.0 9.1    Lipid Panel:     Component Value Date/Time   CHOL 247* 11/01/2015 0508   TRIG 114 11/01/2015 0508   HDL 61 11/01/2015 0508   CHOLHDL 4.0 11/01/2015 0508   VLDL 23 11/01/2015 0508   LDLCALC 163* 11/01/2015 0508   HgbA1c:  Lab Results  Component Value Date   HGBA1C 5.3 11/01/2015   Urine Drug Screen: No results found for: LABOPIA, COCAINSCRNUR, LABBENZ, AMPHETMU, THCU, LABBARB    IMAGING I have personally reviewed the radiological images below and agree with the radiology interpretations.  Ct Head Code Stroke W/o Cm  11/01/2015  IMPRESSION: Remote lacunar infarct in the left basal ganglia. No hemorrhage or CT findings of acute ischemia.  Dg Chest 2 View  11/01/2015  IMPRESSION: Chronic bronchitic type interstitial lung changes but no acute overlying pulmonary process.  Mri and Mra Brain Wo Contrast  11/01/2015  IMPRESSION: 1. Evidence of chronic small vessel disease but no acute infarct or acute intracranial abnormality identified. 2.  Evidence of ICA siphon atherosclerosis but no associated stenosis, and otherwise negative intracranial MRA.    TTE - - Left ventricle: The cavity size was normal. There was mild focal  basal hypertrophy of the septum. Systolic function was normal.  The estimated ejection fraction was in the range of 55% to 60%.  Wall motion was normal; there were no regional wall motion  abnormalities. Doppler parameters are consistent with abnormal  left ventricular relaxation (grade 1 diastolic dysfunction). Impressions: - Normal LV systolic function; grade 1 diastolic dysfunction;  trace TR.  CUS - Bilateral: 1-39% ICA stenosis. Vertebral artery flow is antegrade.  EEG - This appears to be a normal awake and drowsy EEG.   PHYSICAL EXAM Physical exam  Temp:  [97.9 F (36.6 C)-98.6 F (37 C)] 97.9 F (36.6 C) (07/04 0943) Pulse Rate:  [70-76] 76 (07/04 0943) Resp:  [18-20] 20 (07/04 0943) BP: (123-154)/(58-89) 154/74 mmHg (07/04 0943) SpO2:  [94 %-99 %] 99 % (07/04 0943)  General - Well nourished, well developed, in no apparent distress.  Ophthalmologic - Fundi not visualized due to non cooperation.  Cardiovascular - Regular rate and rhythm.  Mental Status -  Level of arousal and orientation to time, place, and person were intact. Language including expression, naming, repetition, comprehension was assessed and found intact. Fund of Knowledge was assessed and was intact.  Cranial Nerves II -  XII - II - Visual field intact OU. III, IV, VI - Extraocular movements intact. V - Facial sensation intact bilaterally. VII - Facial movement intact bilaterally. VIII - Hearing & vestibular intact bilaterally. X - Palate elevates symmetrically. XI - Chin turning & shoulder shrug intact bilaterally. XII - Tongue protrusion intact.  Motor Strength - The patient's strength was normal in all extremities and pronator drift was absent.  Bulk was normal and fasciculations were absent.   Motor  Tone - Muscle tone was assessed at the neck and appendages and was normal.  Reflexes - The patient's reflexes were 1+ in all extremities and she had no pathological reflexes.  Sensory - Light touch, temperature/pinprick were symmetrical.    Coordination - The patient had normal movements in the hands and feet with no ataxia or dysmetria.  Tremor was absent.  Gait and Station - normal gait, stance and arm swing.    ASSESSMENT/PLAN Ms. KAM INCLAN is a 75 y.o. female with history of hyperlipidemia, breast cancer, peripheral neuropathy, anemia and restless leg syndrome presenting with RUE and distal RLE numbness and difficulty walking. She did not receive IV t-PA due to minimal deficits.   Right hand and leg tingling - likely related to HA and/or anxiety/depression. NO acute infarct on MRI so far although pt does have stroke risk factors including HLD and old right caudate head infarct on CT/MRI.   Resultant  Resolving subjective right hand/leg tingling, mild HA persists  MRI  No acute infarct  MRA  ICA siphon atherosclerosis but no significant stenosis  Carotid Doppler unremarkable  2D Echo  EF 55-60%  EEG normal  LDL 163  HgbA1c 5.3  Lovenox 40 mg sq daily for VTE prophylaxis Diet - low sodium heart healthy  No antithrombotic prior to admission, now on aspirin 325 mg daily.   Patient counseled to be compliant with her antithrombotic medications  Ongoing aggressive stroke risk factor management  Therapy recommendations:  No PT/OT follow up  Disposition:  Home  Mild HA  Hx of "sinus HA"  Current episode associated with mild HA which presists  Use naproxen at home  Recommend tylenol this time since pt is on ASA  Follow up with Dr. Jannifer Franklin as outpt  Hypertensive  No hx HTN  Max 171/85  Now stable Long-term BP goal normotensive  Hyperlipidemia  Home meds:  No statin  LDL 163, goal < 70  Add lipitor 20  Continue statin at discharge  Other Stroke  Risk Factors  Advanced age  ETOH use, advised to drink no more than 1 drink(s) a day  Family hx stroke (father x 2) and HLD  Other Active Problems  Depression - on wellbutrine  GERD  R breast cancer s/p Sx  R parotid tumor  RLS on requip - following with Dr. Jannifer Franklin at [facility] day # 1  Neurology will sign off. Please call with questions. Pt will follow up with Dr. Jannifer Franklin at [facility] in about 1 month. Thanks for the consult.  Rosalin Hawking, MD PhD Stroke Neurology 11/02/2015 11:39 PM   To contact Stroke Continuity provider, please refer to [email or web address] After hours, contact General Neurology

## 2015-11-02 NOTE — Care Management Note (Signed)
Case Management Note  Patient Details  Name: Shelley Coleman MRN: [id number] Date of Birth: Mar 07, 1941  Subjective/Objective:                    Action/Plan: Pt discharging home with [facility] services. CM met with the patient and provided her a list of [facility] agencies in the [place] area. She selected [facility]. Information faxed to intake at [facility] per request. Pt refusing aide that is ordered and information past onto the [facility] agency. Pt states her daughter will transport her home. Bedside RN updated.   Expected Discharge Date:                  Expected Discharge Plan:  [place]  In-House Referral:     Discharge planning Services  CM Consult  Post Acute Care Choice:  Home Health Choice offered to:  Patient  DME Arranged:    DME Agency:     HH Arranged:  RN, PT, OT, Nurse's Aide (pt refusing aide) [place]:  [employer] (now [facility])  Status of Service:  Completed, signed off  If discussed at [place] of Stay Meetings, dates discussed:    Additional Comments:  Pollie Friar, RN 11/02/2015, 12:10 PM

## 2015-11-02 NOTE — Progress Notes (Signed)
VASCULAR LAB PRELIMINARY  PRELIMINARY  PRELIMINARY  PRELIMINARY  Carotid duplex completed.    Preliminary report:  1-39% ICA plaquing.  Vertebral artery flow is antegrade.   Janzen Sacks, RVT 11/02/2015, 11:15 AM

## 2015-11-02 NOTE — Progress Notes (Signed)
Occupational Therapy Treatment Patient Details Name: Shelley Coleman MRN: [id number] DOB: 05-01-1941 Today's Date: 11/02/2015    History of present illness 75 yo female admitted with R UE R LE numbness and difficulty walking. CT-remote L basal ganglia PMH: depression, GERD, R Breast CA, hx mitral valve prolapse optic neuritis, restless legs, spine surg, L hip surg, peripheral neuropathy   OT comments  Pt with balance deficits and will benefit from (A) upon d/c home. Pt progressing toward goals but lacks awareness to balance deficits. Pt with poor recall of previous therapy session. Pt demonstrates executive cognitive function deficits.   Follow Up Recommendations  Outpatient OT;Supervision/Assistance - 24 hour    Equipment Recommendations  None recommended by OT    Recommendations for Other Services      Precautions / Restrictions Precautions Precautions: Fall       Mobility Bed Mobility Overal bed mobility: Independent                Transfers Overall transfer level: Needs assistance Equipment used: None Transfers: Sit to/from Stand Sit to Stand: Min guard              Balance Overall balance assessment: Needs assistance                                 ADL Overall ADL's : Needs assistance/impaired Eating/Feeding: Independent   Grooming: Wash/dry hands;Oral care;Standing;Min guard                   Toilet Transfer: Economist and Hygiene: Supervision/safety       Functional mobility during ADLs: Psychiatric nurse   Behavior During Therapy: [facility] for tasks assessed/performed Overall Cognitive Status: Impaired/Different from baseline Area of Impairment: Awareness            Awareness: Anticipatory   General Comments: Pt reports plans to walk in nature trails located in park across the street from  home. Pt lacks awareness to balance deficits. Pt asking therapist about being her therapist last night. pt reports you told me to look at my feet when I walk -- after therapist educated to keep head up with ambulation.     Extremity/Trunk Assessment               Exercises     Shoulder Instructions       General Comments      Pertinent Vitals/ Pain       Pain Assessment: Faces Faces Pain Scale: Hurts little more Pain Location: top of her feet Pain Descriptors / Indicators: Cramping Pain Intervention(s): Repositioned;Premedicated before session;Limited activity within patient's tolerance  Home Living                                          Prior Functioning/Environment              Frequency Min 2X/week     Progress Toward Goals  OT Goals(current goals can now be found in the care plan section)  Progress towards OT goals: Progressing toward goals  Acute Rehab OT Goals  Patient Stated Goal: to return to independence OT Goal Formulation: With patient Time For Goal Achievement: 11/15/15 Potential to Achieve Goals: Good ADL Goals Additional ADL Goal #1: Pt will complete shower transfer supervision level  Additional ADL Goal #2: Pt will gather all adl items MOD I as precurose to adl  Plan Discharge plan remains appropriate    Co-evaluation                 End of Session Equipment Utilized During Treatment: Gait belt   Activity Tolerance Patient tolerated treatment well   Patient Left in chair;with call bell/phone within reach   Nurse Communication Mobility status;Precautions        Time: [id number] OT Time Calculation (min): 23 min  Charges: OT General Charges $OT Visit: 1 Procedure OT Treatments $Self Care/Home Management : 23-37 mins  Parke Poisson B 11/02/2015, 8:46 AM  Jeri Modena   OTR/L Pager: [phone] Office: [phone] .

## 2015-11-04 ENCOUNTER — Telehealth: Payer: Self-pay | Admitting: Neurology

## 2015-11-04 ENCOUNTER — Encounter: Payer: Self-pay | Admitting: Adult Health

## 2015-11-04 ENCOUNTER — Ambulatory Visit (INDEPENDENT_AMBULATORY_CARE_PROVIDER_SITE_OTHER): Payer: Medicare Other | Admitting: Adult Health

## 2015-11-04 VITALS — BP 146/74 | Temp 97.9°F | Ht 67.5 in | Wt 185.1 lb

## 2015-11-04 DIAGNOSIS — G988 Other disorders of nervous system: Secondary | ICD-10-CM

## 2015-11-04 DIAGNOSIS — R519 Headache, unspecified: Secondary | ICD-10-CM

## 2015-11-04 DIAGNOSIS — I10 Essential (primary) hypertension: Secondary | ICD-10-CM

## 2015-11-04 DIAGNOSIS — R51 Headache: Secondary | ICD-10-CM

## 2015-11-04 LAB — CBC WITH DIFFERENTIAL/PLATELET
BASOS PCT: 0.8 % (ref 0.0–3.0)
Basophils Absolute: 0.1 10*3/uL (ref 0.0–0.1)
EOS ABS: 0.2 10*3/uL (ref 0.0–0.7)
EOS PCT: 3.1 % (ref 0.0–5.0)
HCT: 40.5 % (ref 36.0–46.0)
Hemoglobin: 13.4 g/dL (ref 12.0–15.0)
LYMPHS ABS: 2 10*3/uL (ref 0.7–4.0)
Lymphocytes Relative: 27.5 % (ref 12.0–46.0)
MCHC: 33 g/dL (ref 30.0–36.0)
MCV: 89.5 fl (ref 78.0–100.0)
MONO ABS: 0.7 10*3/uL (ref 0.1–1.0)
Monocytes Relative: 9.6 % (ref 3.0–12.0)
NEUTROS ABS: 4.3 10*3/uL (ref 1.4–7.7)
NEUTROS PCT: 59 % (ref 43.0–77.0)
PLATELETS: 245 10*3/uL (ref 150.0–400.0)
RBC: 4.53 Mil/uL (ref 3.87–5.11)
RDW: 14.5 % (ref 11.5–15.5)
WBC: 7.3 10*3/uL (ref 4.0–10.5)

## 2015-11-04 LAB — COMPREHENSIVE METABOLIC PANEL
ALT: 14 U/L (ref 0–35)
AST: 14 U/L (ref 0–37)
Albumin: 4.3 g/dL (ref 3.5–5.2)
Alkaline Phosphatase: 92 U/L (ref 39–117)
BUN: 18 mg/dL (ref 6–23)
CHLORIDE: 104 meq/L (ref 96–112)
CO2: 29 meq/L (ref 19–32)
CREATININE: 0.79 mg/dL (ref 0.40–1.20)
Calcium: 9.8 mg/dL (ref 8.4–10.5)
GFR: 75.75 mL/min (ref >60.00)
Glucose, Bld: 94 mg/dL (ref 70–99)
POTASSIUM: 4.7 meq/L (ref 3.5–5.1)
SODIUM: 139 meq/L (ref 135–145)
Total Bilirubin: 0.4 mg/dL (ref 0.2–1.2)
Total Protein: 6.8 g/dL (ref 6.0–8.3)

## 2015-11-04 LAB — T4, FREE: Free T4: 0.77 ng/dL (ref 0.60–1.60)

## 2015-11-04 LAB — T3, FREE: T3 FREE: 2.9 pg/mL (ref 2.3–4.2)

## 2015-11-04 MED ORDER — ALPRAZOLAM 1 MG PO TABS: 1.0000 mg | ORAL_TABLET | Freq: Every day | ORAL | Status: DC

## 2015-11-04 MED ORDER — ATENOLOL 50 MG PO TABS: 50.0000 mg | ORAL_TABLET | Freq: Every day | ORAL | Status: DC

## 2015-11-04 NOTE — Patient Instructions (Signed)
It was great seeing you again!  I will follow up with you regarding your blood work   Let me know if you do not hear from Neurology   Take your blood pressure medication once a day, every day.

## 2015-11-04 NOTE — Progress Notes (Signed)
Subjective:    Patient ID: Shelley Coleman, female    DOB: 11-13-1940, 76 y.o.   MRN: [id number]  HPI  75 year old female who presents to the office today for hospital follow up. She was admitted through [facility] on 11/01/2015 and discharged on 11/02/2014 for probable TIA.   Per hospital note  HPI: Shelley Coleman is a 75 y.o. female with medical history significant of HLD, BRCA. Patient presents to the ED at [facility] with acute onset of numbness of RUE and RLE and difficulty walking. No PMH of stroke nor TIA. Not on antiplatelet therapy. Symptoms have persisted since onset at 1AM. Nothing makes better or worse. No facial droop, no change in speech. Has tried nothing for symptoms.  ED Course: CT shows nothing acute, old left basal ganglia infarct seen.  MRA Head/Brain IMPRESSION: 1. Evidence of chronic small vessel disease but no acute infarct or acute intracranial abnormality identified. 2. Evidence of ICA siphon atherosclerosis but no associated stenosis, and otherwise negative intracranial MRA.  Carotid Ultrasound within normal limits EEG within normal limits.   Labs were unremarkable. She was started on Lipitor and ASA 346m  Today in the office she reports that she continues to "not feel right in the head" She has a " deep aching " headache in occipital area and she feels as though she has lost strength and sensation on her right side. She has been using tylenol but this does not seem to be effective.   Review of Systems  Constitutional: Positive for activity change.  Respiratory: Negative.   Cardiovascular: Negative.   Musculoskeletal: Negative.   Neurological: Positive for dizziness, weakness (rigth arm and right leg) and headaches. Negative for facial asymmetry.  Hematological: Negative.   Psychiatric/Behavioral: Negative.   All other systems reviewed and are negative.      Objective:   Physical Exam  Constitutional: She is oriented to person, place, and time. She appears  well-developed and well-nourished. No distress.  HENT:  Head: Normocephalic and atraumatic.  Right Ear: External ear normal.  Left Ear: External ear normal.  Nose: Nose normal.  Mouth/Throat: Oropharynx is clear and moist. No oropharyngeal exudate.  Eyes: Conjunctivae and EOM are normal. Pupils are equal, round, and reactive to light. Right eye exhibits no discharge. Left eye exhibits no discharge. No scleral icterus.  Neck: Normal range of motion. Neck supple.  Cardiovascular: Normal rate, regular rhythm, normal heart sounds and intact distal pulses.  Exam reveals no gallop and no friction rub.   No murmur heard. Pulmonary/Chest: Effort normal and breath sounds normal. No respiratory distress. She has no wheezes. She has no rales. She exhibits no tenderness.  Musculoskeletal: Normal range of motion. She exhibits no edema or tenderness.  Neurological: She is alert and oriented to person, place, and time. She has normal reflexes. She displays no atrophy, no tremor and normal reflexes. A sensory deficit (right arm and right leg have less sensation then left) is present. No cranial nerve deficit. She exhibits abnormal muscle tone. She displays no seizure activity. Coordination normal. GCS eye subscore is 4. GCS verbal subscore is 5. GCS motor subscore is 6.  3/5 grip strength in right hand.  5/5 grip strength in left hand 3/5 strength in right upper extremity with pulling and pushing movements 5/5  strength in left upper extremity with pulling and pushing movements 3/5 strength in right lower extremity with pulling and pushing movements 5/5 strength in left lower extremity with pulling and pushing movements  Skin: Skin is warm and dry. No rash noted. She is not diaphoretic. No erythema. No pallor.  Psychiatric: She has a normal mood and affect. Her behavior is normal. Judgment and thought content normal.  Nursing note and vitals reviewed.     Assessment & Plan:  1. Neurologic disorder -  Unsure of what is causing her sensory deficit and/or weakness in right upper and lower extremities. She does not have any slurred speech, pronator drift, facial droop. CT and MRA were normal except for old right sided infarct. Possibly psychosomatic? - She will be following up with neurology  - CBC with Differential/Platelet - CMP - T3, Free - T4, Free 2. Essential hypertension - Log shows BP between 701-779'T systolic. She does not take her medications every day, just when she feels as though she needs it - Take atenolol every day - Continue to monitor BP at home  - Return parameters given.   3. Intractable headache, unspecified chronicity pattern, unspecified headache type - Continue with tylenol. - Maybe related to headache?  Dorothyann Peng, NP

## 2015-11-04 NOTE — Telephone Encounter (Signed)
Patient is calling. She was discharged from the hospital on 11-02-15 from [facility] neurology unit and per discharge is to follow up with Dr. Jannifer Franklin in 1 week.

## 2015-11-04 NOTE — Telephone Encounter (Signed)
Returned pt TC. Appt scheduled for next Friday @ 10 am so that someone would be available to drive pt to [facility]. Pt agreed to arrive 15 min prior to appt time. No additional concerns voiced at this time.

## 2015-11-05 DIAGNOSIS — Z7982 Long term (current) use of aspirin: Secondary | ICD-10-CM | POA: Diagnosis not present

## 2015-11-05 DIAGNOSIS — Z8673 Personal history of transient ischemic attack (TIA), and cerebral infarction without residual deficits: Secondary | ICD-10-CM | POA: Diagnosis not present

## 2015-11-05 LAB — VAS US CAROTID
LCCAPDIAS: -26 cm/s
LCCAPSYS: -117 cm/s
LEFT ECA DIAS: -22 cm/s
LEFT VERTEBRAL DIAS: -19 cm/s
LICADSYS: -80 cm/s
Left CCA dist dias: -26 cm/s
Left CCA dist sys: -85 cm/s
Left ICA dist dias: -25 cm/s
Left ICA prox dias: -24 cm/s
Left ICA prox sys: -73 cm/s
RCCAPDIAS: 22 cm/s
RIGHT ECA DIAS: -26 cm/s
RIGHT VERTEBRAL DIAS: -14 cm/s
Right CCA prox sys: 75 cm/s
Right cca dist sys: -107 cm/s

## 2015-11-09 DIAGNOSIS — Z8673 Personal history of transient ischemic attack (TIA), and cerebral infarction without residual deficits: Secondary | ICD-10-CM | POA: Diagnosis not present

## 2015-11-09 DIAGNOSIS — Z7982 Long term (current) use of aspirin: Secondary | ICD-10-CM | POA: Diagnosis not present

## 2015-11-10 ENCOUNTER — Telehealth: Payer: Self-pay | Admitting: Adult Health

## 2015-11-10 DIAGNOSIS — Z7982 Long term (current) use of aspirin: Secondary | ICD-10-CM | POA: Diagnosis not present

## 2015-11-10 DIAGNOSIS — Z8673 Personal history of transient ischemic attack (TIA), and cerebral infarction without residual deficits: Secondary | ICD-10-CM | POA: Diagnosis not present

## 2015-11-10 NOTE — Telephone Encounter (Signed)
Ok to continue OT 

## 2015-11-10 NOTE — Telephone Encounter (Signed)
Clair Gulling notified of verbal orders.

## 2015-11-10 NOTE — Telephone Encounter (Signed)
Need verbal orders for physical therapy 2x's a week for 4 weeks

## 2015-11-10 NOTE — Telephone Encounter (Signed)
Ok for verbal orders ?

## 2015-11-10 NOTE — Telephone Encounter (Signed)
Ok to continue

## 2015-11-10 NOTE — Telephone Encounter (Signed)
[facility] due to stroke. Pt came home 11/08/15. Clair Gulling would like a verbal order to continue OT

## 2015-11-11 DIAGNOSIS — Z7982 Long term (current) use of aspirin: Secondary | ICD-10-CM | POA: Diagnosis not present

## 2015-11-11 DIAGNOSIS — Z8673 Personal history of transient ischemic attack (TIA), and cerebral infarction without residual deficits: Secondary | ICD-10-CM | POA: Diagnosis not present

## 2015-11-11 NOTE — Telephone Encounter (Signed)
Per Tommi Rumps this is ok.  Verbal orders given to [facility].

## 2015-11-12 ENCOUNTER — Telehealth: Payer: Self-pay

## 2015-11-12 ENCOUNTER — Ambulatory Visit (INDEPENDENT_AMBULATORY_CARE_PROVIDER_SITE_OTHER): Payer: Medicare Other | Admitting: Neurology

## 2015-11-12 ENCOUNTER — Encounter: Payer: Self-pay | Admitting: Neurology

## 2015-11-12 VITALS — BP 132/76 | HR 82 | Ht 67.5 in | Wt 185.0 lb

## 2015-11-12 DIAGNOSIS — Z8673 Personal history of transient ischemic attack (TIA), and cerebral infarction without residual deficits: Secondary | ICD-10-CM | POA: Diagnosis not present

## 2015-11-12 DIAGNOSIS — I639 Cerebral infarction, unspecified: Secondary | ICD-10-CM | POA: Diagnosis not present

## 2015-11-12 DIAGNOSIS — R202 Paresthesia of skin: Secondary | ICD-10-CM | POA: Insufficient documentation

## 2015-11-12 DIAGNOSIS — G2581 Restless legs syndrome: Secondary | ICD-10-CM | POA: Diagnosis not present

## 2015-11-12 DIAGNOSIS — Z7982 Long term (current) use of aspirin: Secondary | ICD-10-CM | POA: Diagnosis not present

## 2015-11-12 NOTE — Progress Notes (Signed)
Reason for visit: TIA, restless leg syndrome  Shelley Coleman is an 75 y.o. female  History of present illness:  Shelley Coleman is a 75 year old right-handed white female with a history of restless leg syndrome. The patient was admitted to the hospital on 11/01/2015 with onset of right sided numbness involving the arm and leg associated with problems with coordination of the right side, and a gait disturbance. The episode came on suddenly, and the patient developed an occipital headache associated with nausea, no vomiting. The patient noted ocillopsia, no double vision. She went to the emergency room and was admitted for a stroke evaluation. The MRI of the brain shows small vessel disease, but no acute stroke was noted. MRA of the head, carotid Doppler studies, TEE evaluation, and 2-D echocardiogram were done and were unremarkable. The patient was placed on a 325 mg aspirin tablet. She has had some physical therapy as outpatient. She believes that her symptoms have not fully cleared, she still has some numbness on the right side and some balance issues. She denies any falls. There were no problems with slurred speech or difficulty swallowing. The possibility of a migraine event was entertained. She continues to have ongoing issues with restless legs, she is not sleeping well at night, she is not fully responding to the oral medication regimen.  Past Medical History  Diagnosis Date  . Depression   . GERD (gastroesophageal reflux disease)   . Hyperlipidemia   . Breast cancer ([place])     right  . Ovarian cyst   . Parotid tumor     right  . Shingles 2000    Left in V1  . Hx of mitral valve prolapse   . Irregular heart beat   . Restless legs syndrome   . Osteopenia   . Optic neuritis     left  . Pelvic fracture ([place])   . History of GI bleed   . Hiatal hernia   . Anemia     Has had iron infusions  . Depression   . Nocturnal leg cramps 09/02/2015    Past Surgical History  Procedure Laterality  Date  . Appendectomy    . Abdominal hysterectomy    . Spine surgery    . Mastectomy Right   . Breast lumpectomy Left   . Total hip arthroplasty Left   . Hernia repair  08/2009  . Esophageal manometry N/A 01/13/2013    Procedure: ESOPHAGEAL MANOMETRY (EM);  Surgeon: Sable Feil, MD;  Location: [facility];  Service: Endoscopy;  Laterality: N/A;  . Cataract extraction      Family History  Problem Relation Age of Onset  . Cancer Mother     Angio sarcoma   . Heart disease Father   . Hypertension Father   . Stroke Father     x2  . Hernia Brother   . Hypertension Brother   . Heart attack Father     x 11    Social history:  reports that she has never smoked. She has never used smokeless tobacco. She reports that she drinks alcohol. She reports that she does not use illicit drugs.    Allergies  Allergen Reactions  . Diflunisal     REACTION: rash , swelling  anaphlaxsis  . Tetanus Toxoid     REACTION: arm swelling, fever    Medications:  Prior to Admission medications   Medication Sig Start Date End Date Taking? Authorizing Provider  ALPRAZolam Duanne Moron) 1 MG tablet Take 1  tablet (1 mg total) by mouth at bedtime. 11/04/15  Yes Dorothyann Peng, NP  aspirin 325 MG tablet Take 1 tablet (325 mg total) by mouth daily. 11/02/15  Yes Reyne Dumas, MD  atenolol (TENORMIN) 50 MG tablet Take 1 tablet (50 mg total) by mouth daily. 11/04/15  Yes Dorothyann Peng, NP  atorvastatin (LIPITOR) 20 MG tablet Take 1 tablet (20 mg total) by mouth daily at 6 PM. 11/02/15  Yes Reyne Dumas, MD  buPROPion (WELLBUTRIN SR) 150 MG 12 hr tablet Take 1 tablet (150 mg total) by mouth 2 (two) times daily. 03/19/15  Yes Dorothyann Peng, NP  DEXILANT 60 MG capsule TAKE ONE CAPSULE BY MOUTH ONCE DAILY. 08/06/15  Yes Dorothyann Peng, NP  HYDROcodone-acetaminophen (NORCO) 7.5-325 MG tablet Take 1 tablet by mouth every 6 (six) hours as needed. 08/23/15  Yes Kathrynn Ducking, MD  MELATONIN PO Take 1 tablet by mouth at bedtime as  needed (for sleep).    Yes Historical Provider, MD  Multiple Vitamin (MULTIVITAMIN) tablet Take 1 tablet by mouth daily.    Yes Historical Provider, MD  NITROSTAT 0.4 MG SL tablet PLACE ONE TABLET UNDER THE TONGUE EVERY 5 MINUTES AS NEEDED. 11/16/14  Yes Dorena Cookey, MD  rOPINIRole (REQUIP) 1 MG tablet One tablet at 5 pm, one tablet at bed time Patient taking differently: Take 1 mg by mouth daily. At 4 pm 09/02/15  Yes Kathrynn Ducking, MD  rOPINIRole (REQUIP) 3 MG tablet TAKE ONE TABLET BY MOUTH ONCE DAILY AT BEDTIME 08/06/15  Yes Kathrynn Ducking, MD    ROS:  Out of a complete 14 system review of symptoms, the patient complains only of the following symptoms, and all other reviewed systems are negative.  Fatigue Restless legs, insomnia, frequent waking Muscle cramps, walking difficulty Dizziness, headache, numbness, weakness Depression, anxiety  Blood pressure 132/76, pulse 82, height 5' 7.5" (1.715 m), weight 185 lb (83.915 kg).  Physical Exam  General: The patient is alert and cooperative at the time of the examination.  Skin: No significant peripheral edema is noted.   Neurologic Exam  Mental status: The patient is alert and oriented x 3 at the time of the examination. The patient has apparent normal recent and remote memory, with an apparently normal attention span and concentration ability.   Cranial nerves: Facial symmetry is present. Speech is normal, no aphasia or dysarthria is noted. Extraocular movements are full. Visual fields are full.  Motor: The patient has good strength in all 4 extremities.  Sensory examination: Soft touch sensation is symmetric on the face, arms, and legs. The patient reports some decrease in pinprick sensation on the right arm and right leg as compared to the left.  Coordination: The patient has good finger-nose-finger and heel-to-shin bilaterally.  Gait and station: The patient has a normal gait. Tandem gait is normal. Romberg is negative.  No drift is seen.  Reflexes: Deep tendon reflexes are symmetric.   Ct Head Code Stroke W/o Cm  11/01/2015 IMPRESSION: Remote lacunar infarct in the left basal ganglia. No hemorrhage or CT findings of acute ischemia.   Dg Chest 2 View  11/01/2015 IMPRESSION: Chronic bronchitic type interstitial lung changes but no acute overlying pulmonary process.  Mri and Mra Brain Wo Contrast  11/01/2015 IMPRESSION: 1. Evidence of chronic small vessel disease but no acute infarct or acute intracranial abnormality identified. 2. Evidence of ICA siphon atherosclerosis but no associated stenosis, and otherwise negative intracranial MRA. * MRI scan images were reviewed online.  I agree with the written report.    TTE - - Left ventricle: The cavity size was normal. There was mild focal  basal hypertrophy of the septum. Systolic function was normal.  The estimated ejection fraction was in the range of 55% to 60%.  Wall motion was normal; there were no regional wall motion  abnormalities. Doppler parameters are consistent with abnormal  left ventricular relaxation (grade 1 diastolic dysfunction). Impressions: - Normal LV systolic function; grade 1 diastolic dysfunction;  trace TR.  CUS - Bilateral: 1-39% ICA stenosis. Vertebral artery flow is antegrade.  EEG - This appears to be a normal awake and drowsy EEG.   Assessment/Plan:  1. Restless leg syndrome  2. Episode of right sided numbness  The patient has had sensory symptoms on the right arm and leg excluding the face with associated headache and nausea, and oscillopsia. The event could have represented a small medullary stroke that was missed by MRI. The patient will remain on aspirin. The patient was also placed on a cholesterol lowering agent. The patient will finish up with physical therapy. Given the ongoing restless leg symptoms, I will set her up for a treatment of IV iron. She will follow-up for her next scheduled visit on  03/08/2016.  Jill Alexanders MD 11/12/2015 3:25 PM  [facility] [address] [place], [place] [address]  Phone [phone] Fax [phone]

## 2015-11-12 NOTE — Telephone Encounter (Signed)
That's fine

## 2015-11-15 DIAGNOSIS — Z8673 Personal history of transient ischemic attack (TIA), and cerebral infarction without residual deficits: Secondary | ICD-10-CM | POA: Diagnosis not present

## 2015-11-15 DIAGNOSIS — Z7982 Long term (current) use of aspirin: Secondary | ICD-10-CM | POA: Diagnosis not present

## 2015-11-16 DIAGNOSIS — Z8673 Personal history of transient ischemic attack (TIA), and cerebral infarction without residual deficits: Secondary | ICD-10-CM | POA: Diagnosis not present

## 2015-11-16 DIAGNOSIS — Z7982 Long term (current) use of aspirin: Secondary | ICD-10-CM | POA: Diagnosis not present

## 2015-11-17 DIAGNOSIS — Z7982 Long term (current) use of aspirin: Secondary | ICD-10-CM | POA: Diagnosis not present

## 2015-11-17 DIAGNOSIS — Z8673 Personal history of transient ischemic attack (TIA), and cerebral infarction without residual deficits: Secondary | ICD-10-CM | POA: Diagnosis not present

## 2015-11-18 ENCOUNTER — Telehealth: Payer: Self-pay | Admitting: Neurology

## 2015-11-18 NOTE — Telephone Encounter (Signed)
Patient called to check status of iron infusion appointment, hasn't heard from anyone. Please call [phone].

## 2015-11-18 NOTE — Telephone Encounter (Signed)
Spoke to [place] in [place]. She did receive orders for IV iron and will contact pt to schedule. Says they are booked out a couple of weeks. Pt notified via return call. Agreed to call back next week if not contacted to schedule. Verbalized understanding and appreciation for call.

## 2015-11-19 DIAGNOSIS — Z8673 Personal history of transient ischemic attack (TIA), and cerebral infarction without residual deficits: Secondary | ICD-10-CM | POA: Diagnosis not present

## 2015-11-19 DIAGNOSIS — Z7982 Long term (current) use of aspirin: Secondary | ICD-10-CM | POA: Diagnosis not present

## 2015-11-22 ENCOUNTER — Ambulatory Visit: Payer: Medicare Other | Admitting: Psychology

## 2015-11-22 DIAGNOSIS — Z8673 Personal history of transient ischemic attack (TIA), and cerebral infarction without residual deficits: Secondary | ICD-10-CM | POA: Diagnosis not present

## 2015-11-22 DIAGNOSIS — Z7982 Long term (current) use of aspirin: Secondary | ICD-10-CM | POA: Diagnosis not present

## 2015-11-23 DIAGNOSIS — Z8673 Personal history of transient ischemic attack (TIA), and cerebral infarction without residual deficits: Secondary | ICD-10-CM | POA: Diagnosis not present

## 2015-11-23 DIAGNOSIS — Z7982 Long term (current) use of aspirin: Secondary | ICD-10-CM | POA: Diagnosis not present

## 2015-11-24 DIAGNOSIS — Z8673 Personal history of transient ischemic attack (TIA), and cerebral infarction without residual deficits: Secondary | ICD-10-CM | POA: Diagnosis not present

## 2015-11-24 DIAGNOSIS — Z7982 Long term (current) use of aspirin: Secondary | ICD-10-CM | POA: Diagnosis not present

## 2015-11-24 NOTE — Telephone Encounter (Signed)
Talked to [facility] staff who reported that they're waiting for prior auth on infusion. RN called and notified pt that [facility] will call to schedule when approved.

## 2015-11-24 NOTE — Telephone Encounter (Signed)
Patient is calling back regarding scheduling an appointment for an iron infusion.

## 2015-11-24 NOTE — Telephone Encounter (Signed)
Otila Kluver RN is out of the office this week. New written orders for IV iron completed/signed by Dr. Jannifer Franklin and given to [facility] RN.

## 2015-11-25 DIAGNOSIS — Z7982 Long term (current) use of aspirin: Secondary | ICD-10-CM | POA: Diagnosis not present

## 2015-11-25 DIAGNOSIS — Z8673 Personal history of transient ischemic attack (TIA), and cerebral infarction without residual deficits: Secondary | ICD-10-CM | POA: Diagnosis not present

## 2015-11-29 DIAGNOSIS — Z8673 Personal history of transient ischemic attack (TIA), and cerebral infarction without residual deficits: Secondary | ICD-10-CM | POA: Diagnosis not present

## 2015-11-29 DIAGNOSIS — Z7982 Long term (current) use of aspirin: Secondary | ICD-10-CM | POA: Diagnosis not present

## 2015-11-30 ENCOUNTER — Other Ambulatory Visit: Payer: Self-pay | Admitting: Neurology

## 2015-11-30 ENCOUNTER — Other Ambulatory Visit: Payer: Self-pay | Admitting: Adult Health

## 2015-12-01 DIAGNOSIS — Z7982 Long term (current) use of aspirin: Secondary | ICD-10-CM | POA: Diagnosis not present

## 2015-12-01 DIAGNOSIS — Z8673 Personal history of transient ischemic attack (TIA), and cerebral infarction without residual deficits: Secondary | ICD-10-CM | POA: Diagnosis not present

## 2015-12-03 DIAGNOSIS — D638 Anemia in other chronic diseases classified elsewhere: Secondary | ICD-10-CM | POA: Diagnosis not present

## 2015-12-03 DIAGNOSIS — G2581 Restless legs syndrome: Secondary | ICD-10-CM | POA: Diagnosis not present

## 2015-12-07 ENCOUNTER — Other Ambulatory Visit: Payer: Self-pay | Admitting: Emergency Medicine

## 2015-12-07 MED ORDER — ATORVASTATIN CALCIUM 20 MG PO TABS: 20.0000 mg | ORAL_TABLET | Freq: Every day | ORAL | 5 refills | Status: DC

## 2015-12-23 ENCOUNTER — Other Ambulatory Visit: Payer: Self-pay | Admitting: Adult Health

## 2015-12-23 NOTE — Telephone Encounter (Signed)
Ok to refill 

## 2015-12-23 NOTE — Telephone Encounter (Signed)
Ok to refill for one month  

## 2015-12-24 NOTE — Progress Notes (Signed)
OT NOTE- LATE GCODE ENTRY     11/26/2015 0800  OT G-codes **NOT FOR INPATIENT CLASS**  Functional Assessment Tool Used clinical judgement  Functional Limitation Self care  Self Care Current Status [phone]) CK  Self Care Goal Status [id number]) CI         Jeri Modena   OTR/L Pager: [phone] Office: [phone] .

## 2015-12-24 NOTE — Telephone Encounter (Signed)
Rx called in as directed.   

## 2016-01-27 DIAGNOSIS — Z853 Personal history of malignant neoplasm of breast: Secondary | ICD-10-CM | POA: Diagnosis not present

## 2016-01-27 DIAGNOSIS — Z1231 Encounter for screening mammogram for malignant neoplasm of breast: Secondary | ICD-10-CM | POA: Diagnosis not present

## 2016-01-27 DIAGNOSIS — Z1239 Encounter for other screening for malignant neoplasm of breast: Secondary | ICD-10-CM | POA: Diagnosis not present

## 2016-01-27 DIAGNOSIS — Z86000 Personal history of in-situ neoplasm of breast: Secondary | ICD-10-CM | POA: Diagnosis not present

## 2016-02-18 ENCOUNTER — Telehealth: Payer: Self-pay

## 2016-02-18 NOTE — Telephone Encounter (Signed)
Per Rachel Bo, patient needs a re-evaluation on PHQ-9. He states that this can be done in office or over the phone, and documented. Thanks!

## 2016-02-21 NOTE — Telephone Encounter (Signed)
Do you mind calling her and going through the depression screen?

## 2016-02-22 DIAGNOSIS — Z23 Encounter for immunization: Secondary | ICD-10-CM | POA: Diagnosis not present

## 2016-02-23 NOTE — Telephone Encounter (Signed)
Left message for patient to return phone call.  

## 2016-03-01 NOTE — Telephone Encounter (Signed)
Left message for patient to return phone call.  

## 2016-03-07 ENCOUNTER — Other Ambulatory Visit: Payer: Self-pay | Admitting: Adult Health

## 2016-03-08 ENCOUNTER — Encounter: Payer: Self-pay | Admitting: Neurology

## 2016-03-08 ENCOUNTER — Ambulatory Visit (INDEPENDENT_AMBULATORY_CARE_PROVIDER_SITE_OTHER): Payer: Medicare Other | Admitting: Neurology

## 2016-03-08 VITALS — BP 118/68 | HR 76 | Resp 16 | Ht 67.5 in | Wt 186.0 lb

## 2016-03-08 DIAGNOSIS — G2581 Restless legs syndrome: Secondary | ICD-10-CM

## 2016-03-08 DIAGNOSIS — I639 Cerebral infarction, unspecified: Secondary | ICD-10-CM

## 2016-03-08 MED ORDER — HYDROCODONE-ACETAMINOPHEN 7.5-325 MG PO TABS: 1.0000 | ORAL_TABLET | Freq: Four times a day (QID) | ORAL | 0 refills | Status: DC | PRN

## 2016-03-08 NOTE — Telephone Encounter (Signed)
Ok to refill 

## 2016-03-08 NOTE — Progress Notes (Signed)
Reason for visit: Restless leg syndrome  Shelley Coleman is an 75 y.o. female  History of present illness:  Shelley Coleman is a 75 year old right-handed white female with a history of restless leg syndrome. The patient has gained benefit with IV iron therapy previously, but the effects of this are wearing off now. The patient still takes the Requip on a regular basis, she begins having symptoms around the late afternoon. The patient has not had any recurrence of numbness or weakness, no other strokelike symptoms. The patient does have some mild gait instability, she may fall on occasion. She returns to this office for an evaluation. The use of IV iron also improved her general fatigue issues.  Past Medical History:  Diagnosis Date  . Anemia    Has had iron infusions  . Breast cancer ([place])    right  . Depression   . Depression   . GERD (gastroesophageal reflux disease)   . Hiatal hernia   . History of GI bleed   . Hx of mitral valve prolapse   . Hyperlipidemia   . Irregular heart beat   . Nocturnal leg cramps 09/02/2015  . Optic neuritis    left  . Osteopenia   . Ovarian cyst   . Parotid tumor    right  . Pelvic fracture ([place])   . Restless legs syndrome   . Shingles 2000   Left in V1    Past Surgical History:  Procedure Laterality Date  . ABDOMINAL HYSTERECTOMY    . APPENDECTOMY    . BREAST LUMPECTOMY Left   . CATARACT EXTRACTION    . ESOPHAGEAL MANOMETRY N/A 01/13/2013   Procedure: ESOPHAGEAL MANOMETRY (EM);  Surgeon: Sable Feil, MD;  Location: [facility];  Service: Endoscopy;  Laterality: N/A;  . HERNIA REPAIR  08/2009  . MASTECTOMY Right   . SPINE SURGERY    . TOTAL HIP ARTHROPLASTY Left     Family History  Problem Relation Age of Onset  . Cancer Mother     Angio sarcoma   . Heart disease Father   . Hypertension Father   . Stroke Father     x2  . Heart attack Father     x 11  . Hernia Brother   . Hypertension Brother     Social history:  reports  that she has never smoked. She has never used smokeless tobacco. She reports that she drinks alcohol. She reports that she does not use drugs.    Allergies  Allergen Reactions  . Diflunisal     REACTION: rash , swelling  anaphlaxsis  . Tetanus Toxoid     REACTION: arm swelling, fever    Medications:  Prior to Admission medications   Medication Sig Start Date End Date Taking? Authorizing Provider  ALPRAZolam Duanne Moron) 1 MG tablet TAKE ONE TABLET BY MOUTH AT BEDTIME. 12/24/15  Yes Dorothyann Peng, NP  aspirin 325 MG tablet Take 1 tablet (325 mg total) by mouth daily. 11/02/15  Yes Reyne Dumas, MD  atenolol (TENORMIN) 50 MG tablet Take 1 tablet (50 mg total) by mouth daily. 11/04/15  Yes Dorothyann Peng, NP  atorvastatin (LIPITOR) 20 MG tablet Take 1 tablet (20 mg total) by mouth daily at 6 PM. 12/07/15  Yes Dorothyann Peng, NP  buPROPion (WELLBUTRIN SR) 150 MG 12 hr tablet Take 1 tablet (150 mg total) by mouth 2 (two) times daily. 03/19/15  Yes Dorothyann Peng, NP  DEXILANT 60 MG capsule TAKE ONE CAPSULE BY MOUTH ONCE  DAILY 11/30/15  Yes Dorothyann Peng, NP  HYDROcodone-acetaminophen (NORCO) 7.5-325 MG tablet Take 1 tablet by mouth every 6 (six) hours as needed. 08/23/15  Yes Kathrynn Ducking, MD  MELATONIN PO Take 1 tablet by mouth at bedtime as needed (for sleep).    Yes Historical Provider, MD  Multiple Vitamin (MULTIVITAMIN) tablet Take 1 tablet by mouth daily.    Yes Historical Provider, MD  NITROSTAT 0.4 MG SL tablet PLACE ONE TABLET UNDER THE TONGUE EVERY 5 MINUTES AS NEEDED. 11/16/14  Yes Dorena Cookey, MD  rOPINIRole (REQUIP) 1 MG tablet One tablet at 5 pm, one tablet at bed time Patient taking differently: Take 1 mg by mouth daily. At 4 pm 09/02/15  Yes Kathrynn Ducking, MD  rOPINIRole (REQUIP) 3 MG tablet TAKE ONE TABLET BY MOUTH ONCE DAILY AT BEDTIME 11/30/15  Yes Kathrynn Ducking, MD    ROS:  Out of a complete 14 system review of symptoms, the patient complains only of the following symptoms, and  all other reviewed systems are negative.  Fatigue Hearing loss Heart murmur Restless legs, insomnia, frequent waking Joint pain, achy muscles, walking difficulty Bruising easily, anemia Numbness, weakness Depression, anxiety  Blood pressure 118/68, pulse 76, resp. rate 16, height 5' 7.5" (1.715 m), weight 186 lb (84.4 kg).  Physical Exam  General: The patient is alert and cooperative at the time of the examination.  Skin: No significant peripheral edema is noted.   Neurologic Exam  Mental status: The patient is alert and oriented x 3 at the time of the examination. The patient has apparent normal recent and remote memory, with an apparently normal attention span and concentration ability.   Cranial nerves: Facial symmetry is present. Speech is normal, no aphasia or dysarthria is noted. Extraocular movements are full. Visual fields are full.  Motor: The patient has good strength in all 4 extremities, with exception of some giveaway weakness with the right hip flexion.  Sensory examination: Soft touch sensation is symmetric on the face, arms, and legs.  Coordination: The patient has good finger-nose-finger and heel-to-shin bilaterally.  Gait and station: The patient has a normal gait. Tandem gait is normal. Romberg is negative. No drift is seen.  Reflexes: Deep tendon reflexes are symmetric.   Assessment/Plan:  1. Restless leg syndrome  2. Mild gait instability  The patient will be set up for IV iron therapy again. She will continue the Requip therapy. She was given a prescription for hydrocodone, she will follow-up in 6 months.  Jill Alexanders MD 03/08/2016 11:19 AM  [facility] [address] [place], [place] [address]  Phone [phone] Fax [phone]

## 2016-03-12 ENCOUNTER — Other Ambulatory Visit: Payer: Self-pay | Admitting: Adult Health

## 2016-03-15 NOTE — Telephone Encounter (Signed)
Left message for patient to return phone call.  

## 2016-03-21 NOTE — Telephone Encounter (Signed)
Unable to leave message for patient - phone line kept ringing. Will try again later.

## 2016-03-29 NOTE — Telephone Encounter (Signed)
Attempted to contact patient multiple times but unable to reach - unable to reach letter mailed.

## 2016-04-06 ENCOUNTER — Other Ambulatory Visit: Payer: Self-pay

## 2016-04-13 ENCOUNTER — Telehealth: Payer: Self-pay | Admitting: *Deleted

## 2016-04-13 NOTE — Telephone Encounter (Signed)
I called patient. The insurance is not approving the IV iron under the diagnosis of restless leg syndrome. They are requiring the diagnosis of iron deficiency and blood work to back up the diagnosis.  The patient does not have an iron deficiency anemia, I am willing to recheck the blood at this time to see if something like this has developed. The patient is amenable to this, she is to contact our office and I will order the blood work.  I'm concerned that we may not be able to get this covered.

## 2016-04-13 NOTE — Telephone Encounter (Signed)
Spoke to pt yesterday, she has been waiting to have IV infusion since last seen 03-08-16.  Per Otila Kluver in [place], insurance denied due to diagnosis of RLS.  (she states  MCR needs  iron deficiency diagnosis as well as lab results.  Last labs done 10/2015  (these did not show low levels).    Please advise.

## 2016-04-27 ENCOUNTER — Ambulatory Visit (INDEPENDENT_AMBULATORY_CARE_PROVIDER_SITE_OTHER): Payer: Medicare Other

## 2016-04-27 VITALS — BP 160/100 | HR 61 | Ht 67.5 in | Wt 189.0 lb

## 2016-04-27 DIAGNOSIS — Z0000 Encounter for general adult medical examination without abnormal findings: Secondary | ICD-10-CM

## 2016-04-27 NOTE — Progress Notes (Signed)
Subjective:   Shelley Coleman is a 75 y.o. female who presents for Medicare Annual (Subsequent) preventive examination.  The Patient was informed that the wellness visit is to identify future health risk and educate and initiate measures that can reduce risk for increased disease through the lifespan.    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 Has felt better;  Sad today as this is the 2nd Christmas without spouse  States she had a stroke in July., was making a cake  Tried to step outside and had no control over her legs  To ER and was admitted    Preventive Screening -Counseling & Management  Mamogram; Breast lumpectomy/ was on chemo quite a few years ago; radical mastectomy on the right Last mammogram 07/2014/ follows up with oncologist  Colonoscopy 0000000 fundiplication; doing well  Normal day Does house keeping  Raked leaves Did go to silver sneaker prior to her stroke  Does have issues sleeping Varies how many hours  Has restless leg syndrome; sees neurology  Iron tx were helping but Insurance denied the last one, then stated her iron levels were not drawn, so was hoping to have this done today. Will schedule apt with [facility].  Smoking history/ never smoked  Second Hand Smoke status; No Smokers in the home  ETOH: rarely   RISK FACTORS Exercise; not since the stroke; Encouraged her re-engage with the silver sneaker program; also encouraged her to continue therapy for balance safety but declines at this time,  Breakfast; sometimes eggs; bacon and juice Next am cheese toast Lunch leftovers - does eat peanut butter  Weight is stable; no weight loss   Fall risk ;  Has fallen more than once States she does have to be very careful Does not know where her feet; whole body coordination seems off  Declines therapy   Mobility of Functional changes this year? Yes since stroke but lives in a one level  home     Cardiac Risk Factors:  Advanced aged > 30 in men; >65 in women Hyperlipidemia - cho 247; trig 114; HDL 61 and LDL 163 Request to recheck since her statin Diabetes A1c 5.3  Family History (mother had cancer; father had HD: HTN: stroke and MI ) Obesity 28.5;  Weight was 189 on scale today but requested documentation of her weight at home 185;    Depression Screen/ c/o of some depression Affect sad; but still remains hopeful; allowed to ventilate; Having difficulty dealing with stroke and recovery Lives alone and feels this is threatened if she has another stroke. Is driving without issues;  Christmas proved difficult  Son and dtr / she has 5 grandchildren;  Spouse was very sick from [place] 2: negative  Activities of Daily Living - See functional screen   Cognitive testing; Ad8 score; 0 or less than 2  MMSE deferred or completed if AD8 + 2 issues  Advanced Directives yes has completed   Dr. Jannifer Franklin; neurology [facility], [facility] readiology Dr. Fara Olden neurological asso  Dr. Erlinda Hong Neurology   Immunization History  Administered Date(s) Administered  . Influenza Split 01/11/2012  . Influenza Whole 01/22/2009  . Influenza, High Dose Seasonal PF 04/06/2014, 01/25/2015, 02/22/2016  . Influenza,inj,Quad PF,36+ Mos 01/22/2013  . Pneumococcal Conjugate-13 01/25/2015  . Pneumococcal Polysaccharide-23 09/16/2007  . Td 05/01/2006  . Zoster 09/16/2007  hx of shingles  Required Immunizations needed today: none  Screening test up  to date or reviewed for plan of completion Health Maintenance Due  Topic Date Due  . DEXA SCAN  07/05/2007    Will postpone for now; very tall; lost 1/2in. Addressed other issues as ankle edema, diet and BP today        Objective:     Vitals: BP (!) 160/100   Pulse 61   Ht 5' 7.5" (1.715 m)   Wt 189 lb (85.7 kg)   SpO2 98%   BMI 29.16 kg/m   Body mass index is 29.16 kg/m.  2nd recheck 150/ 90    Tobacco History  Smoking Status  . Never Smoker  Smokeless Tobacco  . Never Used     Counseling given: Yes   Past Medical History:  Diagnosis Date  . Anemia    Has had iron infusions  . Breast cancer ([place])    right  . Depression   . Depression   . GERD (gastroesophageal reflux disease)   . Hiatal hernia   . History of GI bleed   . Hx of mitral valve prolapse   . Hyperlipidemia   . Irregular heart beat   . Nocturnal leg cramps 09/02/2015  . Optic neuritis    left  . Osteopenia   . Ovarian cyst   . Parotid tumor    right  . Pelvic fracture ([place])   . Restless legs syndrome   . Shingles 2000   Left in V1   Past Surgical History:  Procedure Laterality Date  . ABDOMINAL HYSTERECTOMY    . APPENDECTOMY    . BREAST LUMPECTOMY Left   . CATARACT EXTRACTION    . ESOPHAGEAL MANOMETRY N/A 01/13/2013   Procedure: ESOPHAGEAL MANOMETRY (EM);  Surgeon: Sable Feil, MD;  Location: [facility] END[facility]COPY;  Service: Endoscopy;  Laterality: N/A;  . HERNIA REPAIR  08/2009  . MASTECTOMY Right   . SPINE SURGERY    . TOTAL HIP ARTHROPLASTY Left    Family History  Problem Relation Age of Onset  . Cancer Mother     Angio sarcoma   . Heart disease Father   . Hypertension Father   . Stroke Father     x2  . Heart attack Father     x 11  . Hernia Brother   . Hypertension Brother    History  Sexual Activity  . Sexual activity: Not on file    Outpatient Encounter Prescriptions as of 04/27/2016  Medication Sig  . ALPRAZolam (XANAX) 1 MG tablet TAKE ONE TABLET BY MOUTH AT BEDTIME  . aspirin 325 MG tablet Take 1 tablet (325 mg total) by mouth daily.  Marland Kitchen atenolol (TENORMIN) 50 MG tablet Take 1 tablet (50 mg total) by mouth daily.  Marland Kitchen atorvastatin (LIPITOR) 20 MG tablet Take 1 tablet (20 mg total) by mouth daily at 6 PM.  . buPROPion (WELLBUTRIN SR) 150 MG 12 hr tablet Take 1 tablet (150 mg total) by mouth 2 (two) times daily.  Marland Kitchen DEXILANT 60 MG capsule TAKE ONE CAPSULE BY MOUTH ONCE  DAILY  . HYDROcodone-acetaminophen (NORCO) 7.5-325 MG tablet Take 1 tablet by mouth every 6 (six) hours as needed.  Marland Kitchen MELATONIN PO Take 1 tablet by mouth at bedtime as needed (for sleep).   . Multiple Vitamin (MULTIVITAMIN) tablet Take 1 tablet by mouth daily.   Marland Kitchen NITROSTAT 0.4 MG SL tablet PLACE ONE TABLET UNDER THE TONGUE EVERY 5 MINUTES AS NEEDED.  Marland Kitchen rOPINIRole (REQUIP) 1 MG tablet One tablet at 5 pm, one tablet at bed  time (Patient taking differently: Take 1 mg by mouth daily. At 4 pm)  . rOPINIRole (REQUIP) 3 MG tablet TAKE ONE TABLET BY MOUTH ONCE DAILY AT BEDTIME   No facility-administered encounter medications on file as of 04/27/2016.     Activities of Daily Living In your present state of health, do you have any difficulty performing the following activities: 04/27/2016 11/02/2015  Hearing? Y -  Difficulty concentrating or making decisions? N -  Walking or climbing stairs? Y N  Dressing or bathing? N -  Doing errands, shopping? N -  Preparing Food and eating ? N -  Using the Toilet? N -  In the past six months, have you accidently leaked urine? N -  Do you have problems with loss of bowel control? N -  Managing your Medications? N -  Managing your Finances? N -  Housekeeping or managing your Housekeeping? N -  Some recent data might be hidden    Patient Care Team: Dorena Cookey, MD as PCP - General (Family Medicine)    Assessment:     Exercise Activities and Dietary recommendations Current Exercise Habits: Home exercise routine (on hold due to post stroke) Recommended she go back to silver sneakers but does not feel she is ready  Goals    . Decrease the likelihood of falling    . Exercise 3x per week (30 min per time)    . patient          Continue to recover from stroke and regain independence       Fall Risk Fall Risk  04/27/2016 04/06/2016 03/08/2016 02/17/2015 01/25/2015  Falls in the past year? Yes Yes Yes - Yes  Number falls in past yr: - 2 or more 2 or  more - 1  Injury with Fall? - Yes No - Yes  Risk for fall due to : - - Impaired balance/gait History of fall(s) (No Data)  Risk for fall due to (comments): - - - - Tripped  Follow up - - Falls prevention discussed - Follow up appointment;Falls prevention discussed   Depression Screen PHQ 2/9 Scores 04/27/2016 02/17/2015 02/04/2015 09/14/2014  PHQ - 2 Score 1 4 6 2   PHQ- 9 Score 4 14 18 13     Admits to insomnia but due restless leg Affect sad; but is still engaging in church  Dr. Glennon Hamilton is out and offered for her to see someone else but is not interested at this time. Will come and discuss with Tommi Rumps if she needs any adjustment to her meds or any addition  Hearing Screening Comments: Had hearing issues before Has hearing aids  Vision Screening Comments: Vision; Dr. Lucita Ferrara;  2016 had cataract surgery      Immunization History  Administered Date(s) Administered  . Influenza Split 01/11/2012  . Influenza Whole 01/22/2009  . Influenza, High Dose Seasonal PF 04/06/2014, 01/25/2015, 02/22/2016  . Influenza,inj,Quad PF,36+ Mos 01/22/2013  . Pneumococcal Conjugate-13 01/25/2015  . Pneumococcal Polysaccharide-23 09/16/2007  . Td 05/01/2006  . Zoster 09/16/2007   Screening Tests Health Maintenance  Topic Date Due  . DEXA SCAN  07/05/2007  . TETANUS/TDAP  05/01/2016  . MAMMOGRAM  08/11/2016  . COLONOSCOPY  07/27/2019  . INFLUENZA VACCINE  Completed  . ZOSTAVAX  Completed  . PNA vac Low Risk Adult  Completed      Plan:    PCP Notes  Health Maintenance Dexa due; deferred due to other issues;  Patient is big boned; no smoking hx; is post estrogen;  Is an Therapist, sports; States she had read new research which stated taking calcium does not help. Thinks her bone density was negligible   Depression screen still positive; tearful over spouse;  Is engaging at church; Still fearful of another stroke;  Still falling due to loss of coordination but declines further PT to address at this time.    Declines Silver sneaker   Abnormal Screens  Weight 189 but states it is 185 at home. There was some 1+ pitting edema which she states does not go away in the morning;   Insomnia due to restless leg; Neurology ordering iron; but declined by insurance; feels it is because she did not get her iron level checked. Will request labs for this and lipid recheck  BP 160 100 when arriving;  Later 150/90; denies issues with diet; states she was on a fluid pill. Will See Tommi Rumps tomorrow to fup   Referrals none at present Offered counselor while Dr. Glennon Hamilton is out but declined at present  Patient concerns; scared of having another stroke, losing independent and continues to grieve over spouse  Nurse Concerns; as stated   Next PCP apt 12/28 at 3: 30   During the course of the visit the patient was educated and counseled about the following appropriate screening and preventive services:   Vaccines to include Pneumoccal, Influenza, Hepatitis B, Td, Zostavax, HCV  Electrocardiogram  Cardiovascular Disease  Colorectal cancer screening  Bone density screening  Diabetes screening  Glaucoma screening  Mammography/PAP  Nutrition counseling   Patient Instructions (the written plan) was given to the patient.   Wynetta Fines, RN  04/27/2016

## 2016-04-27 NOTE — Patient Instructions (Addendum)
Shelley Coleman , Thank you for taking time to come for your Medicare Wellness Visit. I appreciate your ongoing commitment to your health goals. Please review the following plan we discussed and let me know if I can assist you in the future.   Will see Tommi Rumps to evaluate BP and edema to ankles  Also to evaluate labs since lipids med as well as any other  Will continue to engage in life    These are the goals we discussed: Goals    . Decrease the likelihood of falling    . Exercise 3x per week (30 min per time)    . patient          Continue to recover from stroke and regain independence        This is a list of the screening recommended for you and due dates:  Health Maintenance  Topic Date Due  . DEXA scan (bone density measurement)  07/05/2007  . Tetanus Vaccine  05/01/2016  . Mammogram  08/11/2016  . Colon Cancer Screening  07/27/2019  . Flu Shot  Completed  . Shingles Vaccine  Completed  . Pneumonia vaccines  Completed        Fall Prevention in the Home Introduction Falls can cause injuries. They can happen to people of all ages. There are many things you can do to make your home safe and to help prevent falls. What can I do on the outside of my home?  Regularly fix the edges of walkways and driveways and fix any cracks.  Remove anything that might make you trip as you walk through a door, such as a raised step or threshold.  Trim any bushes or trees on the path to your home.  Use bright outdoor lighting.  Clear any walking paths of anything that might make someone trip, such as rocks or tools.  Regularly check to see if handrails are loose or broken. Make sure that both sides of any steps have handrails.  Any raised decks and porches should have guardrails on the edges.  Have any leaves, snow, or ice cleared regularly.  Use sand or salt on walking paths during winter.  Clean up any spills in your garage right away. This includes oil or grease spills. What can  I do in the bathroom?  Use night lights.  Install grab bars by the toilet and in the tub and shower. Do not use towel bars as grab bars.  Use non-skid mats or decals in the tub or shower.  If you need to sit down in the shower, use a plastic, non-slip stool.  Keep the floor dry. Clean up any water that spills on the floor as soon as it happens.  Remove soap buildup in the tub or shower regularly.  Attach bath mats securely with double-sided non-slip rug tape.  Do not have throw rugs and other things on the floor that can make you trip. What can I do in the bedroom?  Use night lights.  Make sure that you have a light by your bed that is easy to reach.  Do not use any sheets or blankets that are too big for your bed. They should not hang down onto the floor.  Have a firm chair that has side arms. You can use this for support while you get dressed.  Do not have throw rugs and other things on the floor that can make you trip. What can I do in the kitchen?  Clean up any  spills right away.  Avoid walking on wet floors.  Keep items that you use a lot in easy-to-reach places.  If you need to reach something above you, use a strong step stool that has a grab bar.  Keep electrical cords out of the way.  Do not use floor polish or wax that makes floors slippery. If you must use wax, use non-skid floor wax.  Do not have throw rugs and other things on the floor that can make you trip. What can I do with my stairs?  Do not leave any items on the stairs.  Make sure that there are handrails on both sides of the stairs and use them. Fix handrails that are broken or loose. Make sure that handrails are as long as the stairways.  Check any carpeting to make sure that it is firmly attached to the stairs. Fix any carpet that is loose or worn.  Avoid having throw rugs at the top or bottom of the stairs. If you do have throw rugs, attach them to the floor with carpet tape.  Make sure  that you have a light switch at the top of the stairs and the bottom of the stairs. If you do not have them, ask someone to add them for you. What else can I do to help prevent falls?  Wear shoes that:  Do not have high heels.  Have rubber bottoms.  Are comfortable and fit you well.  Are closed at the toe. Do not wear sandals.  If you use a stepladder:  Make sure that it is fully opened. Do not climb a closed stepladder.  Make sure that both sides of the stepladder are locked into place.  Ask someone to hold it for you, if possible.  Clearly mark and make sure that you can see:  Any grab bars or handrails.  First and last steps.  Where the edge of each step is.  Use tools that help you move around (mobility aids) if they are needed. These include:  Canes.  Walkers.  Scooters.  Crutches.  Turn on the lights when you go into a dark area. Replace any light bulbs as soon as they burn out.  Set up your furniture so you have a clear path. Avoid moving your furniture around.  If any of your floors are uneven, fix them.  If there are any pets around you, be aware of where they are.  Review your medicines with your doctor. Some medicines can make you feel dizzy. This can increase your chance of falling. Ask your doctor what other things that you can do to help prevent falls. This information is not intended to replace advice given to you by your health care provider. Make sure you discuss any questions you have with your health care provider. Document Released: 02/11/2009 Document Revised: 09/23/2015 Document Reviewed: 05/22/2014  2017 [employer]  Health Maintenance, Female Introduction Adopting a healthy lifestyle and getting preventive care can go a long way to promote health and wellness. Talk with your health care provider about what schedule of regular examinations is right for you. This is a good chance for you to check in with your provider about disease prevention  and staying healthy. In between checkups, there are plenty of things you can do on your own. Experts have done a lot of research about which lifestyle changes and preventive measures are most likely to keep you healthy. Ask your health care provider for more information. Weight and diet Eat a  healthy diet  Be sure to include plenty of vegetables, fruits, low-fat dairy products, and lean protein.  Do not eat a lot of foods high in solid fats, added sugars, or salt.  Get regular exercise. This is one of the most important things you can do for your health.  Most adults should exercise for at least 150 minutes each week. The exercise should increase your heart rate and make you sweat (moderate-intensity exercise).  Most adults should also do strengthening exercises at least twice a week. This is in addition to the moderate-intensity exercise. Maintain a healthy weight  Body mass index (BMI) is a measurement that can be used to identify possible weight problems. It estimates body fat based on height and weight. Your health care provider can help determine your BMI and help you achieve or maintain a healthy weight.  For females 38 years of age and older:  A BMI below 18.5 is considered underweight.  A BMI of 18.5 to 24.9 is normal.  A BMI of 25 to 29.9 is considered overweight.  A BMI of 30 and above is considered obese. Watch levels of cholesterol and blood lipids  You should start having your blood tested for lipids and cholesterol at 73 years of age, then have this test every 5 years.  You may need to have your cholesterol levels checked more often if:  Your lipid or cholesterol levels are high.  You are older than 73 years of age.  You are at high risk for heart disease. Cancer screening Lung Cancer  Lung cancer screening is recommended for adults 43-20 years old who are at high risk for lung cancer because of a history of smoking.  A yearly low-dose CT scan of the lungs is  recommended for people who:  Currently smoke.  Have quit within the past 15 years.  Have at least a 30-pack-year history of smoking. A pack year is smoking an average of one pack of cigarettes a day for 1 year.  Yearly screening should continue until it has been 15 years since you quit.  Yearly screening should stop if you develop a health problem that would prevent you from having lung cancer treatment. Breast Cancer  Practice breast self-awareness. This means understanding how your breasts normally appear and feel.  It also means doing regular breast self-exams. Let your health care provider know about any changes, no matter how small.  If you are in your 20s or 30s, you should have a clinical breast exam (CBE) by a health care provider every 1-3 years as part of a regular health exam.  If you are 46 or older, have a CBE every year. Also consider having a breast X-ray (mammogram) every year.  If you have a family history of breast cancer, talk to your health care provider about genetic screening.  If you are at high risk for breast cancer, talk to your health care provider about having an MRI and a mammogram every year.  Breast cancer gene (BRCA) assessment is recommended for women who have family members with BRCA-related cancers. BRCA-related cancers include:  Breast.  Ovarian.  Tubal.  Peritoneal cancers.  Results of the assessment will determine the need for genetic counseling and BRCA1 and BRCA2 testing. Cervical Cancer  Your health care provider may recommend that you be screened regularly for cancer of the pelvic organs (ovaries, uterus, and vagina). This screening involves a pelvic examination, including checking for microscopic changes to the surface of your cervix (Pap test). You  may be encouraged to have this screening done every 3 years, beginning at age 40.  For women ages 41-65, health care providers may recommend pelvic exams and Pap testing every 3 years, or  they may recommend the Pap and pelvic exam, combined with testing for human papilloma virus (HPV), every 5 years. Some types of HPV increase your risk of cervical cancer. Testing for HPV may also be done on women of any age with unclear Pap test results.  Other health care providers may not recommend any screening for nonpregnant women who are considered low risk for pelvic cancer and who do not have symptoms. Ask your health care provider if a screening pelvic exam is right for you.  If you have had past treatment for cervical cancer or a condition that could lead to cancer, you need Pap tests and screening for cancer for at least 20 years after your treatment. If Pap tests have been discontinued, your risk factors (such as having a new sexual partner) need to be reassessed to determine if screening should resume. Some women have medical problems that increase the chance of getting cervical cancer. In these cases, your health care provider may recommend more frequent screening and Pap tests. Colorectal Cancer  This type of cancer can be detected and often prevented.  Routine colorectal cancer screening usually begins at 73 years of age and continues through 73 years of age.  Your health care provider may recommend screening at an earlier age if you have risk factors for colon cancer.  Your health care provider may also recommend using home test kits to check for hidden blood in the stool.  A small camera at the end of a tube can be used to examine your colon directly (sigmoidoscopy or colonoscopy). This is done to check for the earliest forms of colorectal cancer.  Routine screening usually begins at age 6.  Direct examination of the colon should be repeated every 5-10 years through 73 years of age. However, you may need to be screened more often if early forms of precancerous polyps or small growths are found. Skin Cancer  Check your skin from head to toe regularly.  Tell your health care  provider about any new moles or changes in moles, especially if there is a change in a mole's shape or color.  Also tell your health care provider if you have a mole that is larger than the size of a pencil eraser.  Always use sunscreen. Apply sunscreen liberally and repeatedly throughout the day.  Protect yourself by wearing long sleeves, pants, a wide-brimmed hat, and sunglasses whenever you are outside. Heart disease, diabetes, and high blood pressure  High blood pressure causes heart disease and increases the risk of stroke. High blood pressure is more likely to develop in:  People who have blood pressure in the high end of the normal range (130-139/85-89 mm Hg).  People who are overweight or obese.  People who are African American.  If you are 75-1 years of age, have your blood pressure checked every 3-5 years. If you are 64 years of age or older, have your blood pressure checked every year. You should have your blood pressure measured twice-once when you are at a hospital or clinic, and once when you are not at a hospital or clinic. Record the average of the two measurements. To check your blood pressure when you are not at a hospital or clinic, you can use:  An automated blood pressure machine at a pharmacy.  A home blood pressure monitor.  If you are between 38 years and 10 years old, ask your health care provider if you should take aspirin to prevent strokes.  Have regular diabetes screenings. This involves taking a blood sample to check your fasting blood sugar level.  If you are at a normal weight and have a low risk for diabetes, have this test once every three years after 73 years of age.  If you are overweight and have a high risk for diabetes, consider being tested at a younger age or more often. Preventing infection Hepatitis B  If you have a higher risk for hepatitis B, you should be screened for this virus. You are considered at high risk for hepatitis B if:  You  were born in a country where hepatitis B is common. Ask your health care provider which countries are considered high risk.  Your parents were born in a high-risk country, and you have not been immunized against hepatitis B (hepatitis B vaccine).  You have HIV or AIDS.  You use needles to inject street drugs.  You live with someone who has hepatitis B.  You have had sex with someone who has hepatitis B.  You get hemodialysis treatment.  You take certain medicines for conditions, including cancer, organ transplantation, and autoimmune conditions. Hepatitis C  Blood testing is recommended for:  Everyone born from [birth year] through 1963.  Anyone with known risk factors for hepatitis C. Sexually transmitted infections (STIs)  You should be screened for sexually transmitted infections (STIs) including gonorrhea and chlamydia if:  You are sexually active and are younger than 73 years of age.  You are older than 73 years of age and your health care provider tells you that you are at risk for this type of infection.  Your sexual activity has changed since you were last screened and you are at an increased risk for chlamydia or gonorrhea. Ask your health care provider if you are at risk.  If you do not have HIV, but are at risk, it may be recommended that you take a prescription medicine daily to prevent HIV infection. This is called pre-exposure prophylaxis (PrEP). You are considered at risk if:  You are sexually active and do not regularly use condoms or know the HIV status of your partner(s).  You take drugs by injection.  You are sexually active with a partner who has HIV. Talk with your health care provider about whether you are at high risk of being infected with HIV. If you choose to begin PrEP, you should first be tested for HIV. You should then be tested every 3 months for as long as you are taking PrEP. Pregnancy  If you are premenopausal and you may become pregnant, ask your  health care provider about preconception counseling.  If you may become pregnant, take 400 to 800 micrograms (mcg) of folic acid every day.  If you want to prevent pregnancy, talk to your health care provider about birth control (contraception). Osteoporosis and menopause  Osteoporosis is a disease in which the bones lose minerals and strength with aging. This can result in serious bone fractures. Your risk for osteoporosis can be identified using a bone density scan.  If you are 52 years of age or older, or if you are at risk for osteoporosis and fractures, ask your health care provider if you should be screened.  Ask your health care provider whether you should take a calcium or vitamin D supplement to lower your  risk for osteoporosis.  Menopause may have certain physical symptoms and risks.  Hormone replacement therapy may reduce some of these symptoms and risks. Talk to your health care provider about whether hormone replacement therapy is right for you. Follow these instructions at home:  Schedule regular health, dental, and eye exams.  Stay current with your immunizations.  Do not use any tobacco products including cigarettes, chewing tobacco, or electronic cigarettes.  If you are pregnant, do not drink alcohol.  If you are breastfeeding, limit how much and how often you drink alcohol.  Limit alcohol intake to no more than 1 drink per day for nonpregnant women. One drink equals 12 ounces of beer, 5 ounces of wine, or 1 ounces of hard liquor.  Do not use street drugs.  Do not share needles.  Ask your health care provider for help if you need support or information about quitting drugs.  Tell your health care provider if you often feel depressed.  Tell your health care provider if you have ever been abused or do not feel safe at home. This information is not intended to replace advice given to you by your health care provider. Make sure you discuss any questions you have  with your health care provider. Document Released: 10/31/2010 Document Revised: 09/23/2015 Document Reviewed: 01/19/2015  2017 Elsevier

## 2016-04-28 ENCOUNTER — Ambulatory Visit (INDEPENDENT_AMBULATORY_CARE_PROVIDER_SITE_OTHER): Payer: Medicare Other | Admitting: Adult Health

## 2016-04-28 ENCOUNTER — Encounter: Payer: Self-pay | Admitting: Adult Health

## 2016-04-28 VITALS — BP 142/88 | Temp 98.1°F

## 2016-04-28 DIAGNOSIS — R600 Localized edema: Secondary | ICD-10-CM

## 2016-04-28 DIAGNOSIS — I10 Essential (primary) hypertension: Secondary | ICD-10-CM

## 2016-04-28 DIAGNOSIS — I639 Cerebral infarction, unspecified: Secondary | ICD-10-CM

## 2016-04-28 LAB — CBC WITH DIFFERENTIAL/PLATELET
BASOS ABS: 62 {cells}/uL (ref 0–200)
BASOS PCT: 1 %
EOS ABS: 248 {cells}/uL (ref 15–500)
Eosinophils Relative: 4 %
HCT: 40.8 % (ref 35.0–45.0)
HEMOGLOBIN: 13.3 g/dL (ref 11.7–15.5)
LYMPHS ABS: 1922 {cells}/uL (ref 850–3900)
Lymphocytes Relative: 31 %
MCH: 29.5 pg (ref 27.0–33.0)
MCHC: 32.6 g/dL (ref 32.0–36.0)
MCV: 90.5 fL (ref 80.0–100.0)
MONO ABS: 496 {cells}/uL (ref 200–950)
MPV: 9.7 fL (ref 7.5–12.5)
Monocytes Relative: 8 %
NEUTROS ABS: 3472 {cells}/uL (ref 1500–7800)
Neutrophils Relative %: 56 %
Platelets: 223 10*3/uL (ref 140–400)
RBC: 4.51 MIL/uL (ref 3.80–5.10)
RDW: 13.9 % (ref 11.0–15.0)
WBC: 6.2 10*3/uL (ref 3.8–10.8)

## 2016-04-28 LAB — BASIC METABOLIC PANEL
BUN: 19 mg/dL (ref 7–25)
CHLORIDE: 106 mmol/L (ref 98–110)
CO2: 26 mmol/L (ref 20–31)
Calcium: 9.2 mg/dL (ref 8.6–10.4)
Creat: 0.77 mg/dL (ref 0.60–0.93)
Glucose, Bld: 65 mg/dL (ref 65–99)
POTASSIUM: 4.6 mmol/L (ref 3.5–5.3)
Sodium: 140 mmol/L (ref 135–146)

## 2016-04-28 MED ORDER — HYDROCHLOROTHIAZIDE 25 MG PO TABS: 25.0000 mg | ORAL_TABLET | Freq: Every day | ORAL | 1 refills | Status: DC

## 2016-04-28 NOTE — Progress Notes (Signed)
I have reviewed and agree with this note  

## 2016-04-28 NOTE — Progress Notes (Signed)
Subjective:    Patient ID: Shelley Coleman, female    DOB: 02/07/1941, 75 y.o.   MRN: [id number]  HPI   74 year old female who  has a past medical history of Anemia; Breast cancer ([place]); Depression; Depression; GERD (gastroesophageal reflux disease); Hiatal hernia; History of GI bleed; mitral valve prolapse; Hyperlipidemia; Irregular heart beat; Nocturnal leg cramps (09/02/2015); Optic neuritis; Osteopenia; Ovarian cyst; Parotid tumor; Pelvic fracture ([place]); Restless legs syndrome; and Shingles (2000). She reports that her over the last 2-3 weeks she has noticed that her lower extremities have been swelling, specifically in her feet. She denies any changes in her diet and does not eat a high sodium diet. Has not noticed any decreased swelling during the morning and edema does not resolve during the day.   She was seen yesterday for her AWE and it was found that her blood pressure was elevated. She currently takes Atenolol 50 mg daily. She has not been checking her blood pressure at home but does notice that she gets headaches when her blood pressure is high. She denies any blurred vision    BP Readings from Last 3 Encounters:  04/28/16 (!) 142/88  04/27/16 (!) 160/100  03/08/16 118/68    Review of Systems  HENT: Negative.   Eyes: Negative.   Respiratory: Negative.   Cardiovascular: Negative.   Gastrointestinal: Negative.   Neurological: Positive for headaches.  Hematological: Negative.   All other systems reviewed and are negative.  Past Medical History:  Diagnosis Date  . Anemia    Has had iron infusions  . Breast cancer ([place])    right  . Depression   . Depression   . GERD (gastroesophageal reflux disease)   . Hiatal hernia   . History of GI bleed   . Hx of mitral valve prolapse   . Hyperlipidemia   . Irregular heart beat   . Nocturnal leg cramps 09/02/2015  . Optic neuritis    left  . Osteopenia   . Ovarian cyst   . Parotid tumor    right  . Pelvic fracture ([place])   .  Restless legs syndrome   . Shingles 2000   Left in V1    Social History   Social History  . Marital status: Married    Spouse name: N/A  . Number of children: 2  . Years of education: 16   Occupational History  . Retired Therapist, sports    Social History Main Topics  . Smoking status: Never Smoker  . Smokeless tobacco: Never Used  . Alcohol use 0.0 oz/week     Comment: Rarely,socially  . Drug use: No  . Sexual activity: Not on file   Other Topics Concern  . Not on file   Social History Narrative   Retired from nursing    Widowed    Left-handed          Past Surgical History:  Procedure Laterality Date  . ABDOMINAL HYSTERECTOMY    . APPENDECTOMY    . BREAST LUMPECTOMY Left   . CATARACT EXTRACTION    . ESOPHAGEAL MANOMETRY N/A 01/13/2013   Procedure: ESOPHAGEAL MANOMETRY (EM);  Surgeon: Sable Feil, MD;  Location: [facility];  Service: Endoscopy;  Laterality: N/A;  . HERNIA REPAIR  08/2009  . MASTECTOMY Right   . SPINE SURGERY    . TOTAL HIP ARTHROPLASTY Left     Family History  Problem Relation Age of Onset  . Cancer Mother     Angio sarcoma   .  Heart disease Father   . Hypertension Father   . Stroke Father     x2  . Heart attack Father     x 11  . Hernia Brother   . Hypertension Brother     Allergies  Allergen Reactions  . Diflunisal     REACTION: rash , swelling  anaphlaxsis  . Tetanus Toxoid     REACTION: arm swelling, fever    Current Outpatient Prescriptions on File Prior to Visit  Medication Sig Dispense Refill  . ALPRAZolam (XANAX) 1 MG tablet TAKE ONE TABLET BY MOUTH AT BEDTIME 30 tablet 0  . aspirin 325 MG tablet Take 1 tablet (325 mg total) by mouth daily. 30 tablet 0  . atenolol (TENORMIN) 50 MG tablet Take 1 tablet (50 mg total) by mouth daily. 90 tablet 1  . atorvastatin (LIPITOR) 20 MG tablet Take 1 tablet (20 mg total) by mouth daily at 6 PM. 30 tablet 5  . buPROPion (WELLBUTRIN SR) 150 MG 12 hr tablet Take 1 tablet (150 mg total)  by mouth 2 (two) times daily. 180 tablet 3  . DEXILANT 60 MG capsule TAKE ONE CAPSULE BY MOUTH ONCE DAILY 90 capsule 0  . HYDROcodone-acetaminophen (NORCO) 7.5-325 MG tablet Take 1 tablet by mouth every 6 (six) hours as needed. 60 tablet 0  . MELATONIN PO Take 1 tablet by mouth at bedtime as needed (for sleep).     . Multiple Vitamin (MULTIVITAMIN) tablet Take 1 tablet by mouth daily.     Marland Kitchen NITROSTAT 0.4 MG SL tablet PLACE ONE TABLET UNDER THE TONGUE EVERY 5 MINUTES AS NEEDED. 100 tablet 0  . rOPINIRole (REQUIP) 1 MG tablet One tablet at 5 pm, one tablet at bed time (Patient taking differently: Take 1 mg by mouth daily. At 4 pm) 180 tablet 1  . rOPINIRole (REQUIP) 3 MG tablet TAKE ONE TABLET BY MOUTH ONCE DAILY AT BEDTIME 90 tablet 3   No current facility-administered medications on file prior to visit.     BP (!) 142/88   Temp 98.1 F (36.7 C) (Oral)       Objective:   Physical Exam  Constitutional: She is oriented to person, place, and time. She appears well-developed and well-nourished. No distress.  Cardiovascular: Normal rate, regular rhythm, normal heart sounds and intact distal pulses.  Exam reveals no gallop and no friction rub.   No murmur heard. Pulmonary/Chest: Effort normal. No respiratory distress. She has no wheezes. She has no rales. She exhibits no tenderness.  Musculoskeletal: Normal range of motion. She exhibits edema. She exhibits no tenderness or deformity.  + 1 non pitting edema in bilateral lower extremities. Good pedal pulses noted  Neurological: She is alert and oriented to person, place, and time.  Skin: Skin is warm and dry. No rash noted. She is not diaphoretic. No erythema. No pallor.  Psychiatric: She has a normal mood and affect. Her behavior is normal. Judgment and thought content normal.  Nursing note and vitals reviewed.     Assessment & Plan:  1. Essential hypertension  - hydrochlorothiazide (HYDRODIURIL) 25 MG tablet; Take 1 tablet (25 mg total)  by mouth daily.  Dispense: 30 tablet; Refill: 1 - Basic metabolic panel - CBC with Differential/Platelet  2. Lower extremity edema - Basic metabolic panel - CBC with Differential/Platelet - hydrochlorothiazide (HYDRODIURIL) 25 MG tablet; Take 1 tablet (25 mg total) by mouth daily.  Dispense: 30 tablet; Refill: 1  Dorothyann Peng, NP

## 2016-06-26 ENCOUNTER — Telehealth: Payer: Self-pay | Admitting: Neurology

## 2016-06-26 DIAGNOSIS — R5382 Chronic fatigue, unspecified: Secondary | ICD-10-CM

## 2016-06-26 DIAGNOSIS — G2581 Restless legs syndrome: Secondary | ICD-10-CM

## 2016-06-26 DIAGNOSIS — D508 Other iron deficiency anemias: Secondary | ICD-10-CM

## 2016-06-26 MED ORDER — HYDROCODONE-ACETAMINOPHEN 7.5-325 MG PO TABS: 1.0000 | ORAL_TABLET | Freq: Four times a day (QID) | ORAL | 0 refills | Status: DC | PRN

## 2016-06-26 NOTE — Addendum Note (Signed)
Addended by: Margette Fast on: 06/26/2016 06:13 PM   Modules accepted: Orders

## 2016-06-26 NOTE — Telephone Encounter (Signed)
I called patient. She is still having a lot of problems with restless legs, we'll write a prescription for hydrocodone, she will come in for blood work to look at the iron levels and ferritin level.  If these levels are low, we may be able to justify IV iron therapy.

## 2016-06-26 NOTE — Telephone Encounter (Signed)
Patient called office requesting refill for HYDROcodone-acetaminophen ([place]) 7.5-325 MG tablet.   Patient also is wanting to see why her insurance did not want to cover the Iron Infusion.  Please call

## 2016-06-26 NOTE — Telephone Encounter (Signed)
Dr Willis- please advise 

## 2016-06-27 ENCOUNTER — Other Ambulatory Visit (INDEPENDENT_AMBULATORY_CARE_PROVIDER_SITE_OTHER): Payer: Self-pay

## 2016-06-27 DIAGNOSIS — G2581 Restless legs syndrome: Secondary | ICD-10-CM

## 2016-06-27 DIAGNOSIS — D508 Other iron deficiency anemias: Secondary | ICD-10-CM

## 2016-06-27 DIAGNOSIS — R5382 Chronic fatigue, unspecified: Secondary | ICD-10-CM | POA: Diagnosis not present

## 2016-06-27 DIAGNOSIS — Z0289 Encounter for other administrative examinations: Secondary | ICD-10-CM

## 2016-06-27 NOTE — Telephone Encounter (Signed)
Rx hydrocodone up front for patient pick up

## 2016-06-28 LAB — CBC WITH DIFFERENTIAL/PLATELET
BASOS ABS: 0 10*3/uL (ref 0.0–0.2)
Basos: 1 %
EOS (ABSOLUTE): 0.2 10*3/uL (ref 0.0–0.4)
Eos: 4 %
Hematocrit: 41.1 % (ref 34.0–46.6)
Hemoglobin: 13.7 g/dL (ref 11.1–15.9)
Immature Grans (Abs): 0 10*3/uL (ref 0.0–0.1)
Immature Granulocytes: 0 %
LYMPHS ABS: 1.6 10*3/uL (ref 0.7–3.1)
Lymphs: 29 %
MCH: 29.4 pg (ref 26.6–33.0)
MCHC: 33.3 g/dL (ref 31.5–35.7)
MCV: 88 fL (ref 79–97)
MONOS ABS: 0.5 10*3/uL (ref 0.1–0.9)
Monocytes: 9 %
Neutrophils Absolute: 3.2 10*3/uL (ref 1.4–7.0)
Neutrophils: 57 %
Platelets: 228 10*3/uL (ref 150–379)
RBC: 4.66 x10E6/uL (ref 3.77–5.28)
RDW: 14 % (ref 12.3–15.4)
WBC: 5.5 10*3/uL (ref 3.4–10.8)

## 2016-06-28 LAB — IRON AND TIBC
Iron Saturation: 42 % (ref 15–55)
Iron: 135 ug/dL (ref 27–139)
TIBC: 320 ug/dL (ref 250–450)
UIBC: 185 ug/dL (ref 118–369)

## 2016-06-28 LAB — FERRITIN: FERRITIN: 106 ng/mL (ref 15–150)

## 2016-07-07 ENCOUNTER — Other Ambulatory Visit: Payer: Self-pay | Admitting: Adult Health

## 2016-07-07 DIAGNOSIS — G479 Sleep disorder, unspecified: Secondary | ICD-10-CM

## 2016-07-07 DIAGNOSIS — F32A Depression, unspecified: Secondary | ICD-10-CM

## 2016-07-07 DIAGNOSIS — F329 Major depressive disorder, single episode, unspecified: Secondary | ICD-10-CM

## 2016-07-11 ENCOUNTER — Other Ambulatory Visit: Payer: Self-pay | Admitting: Adult Health

## 2016-07-11 NOTE — Telephone Encounter (Signed)
Ok to refill 

## 2016-07-11 NOTE — Telephone Encounter (Signed)
Ok to refill Dexilant for one year   Wellbutrin for 6 months   Xanax for one month

## 2016-07-26 ENCOUNTER — Other Ambulatory Visit: Payer: Self-pay | Admitting: Adult Health

## 2016-09-13 ENCOUNTER — Encounter: Payer: Self-pay | Admitting: Neurology

## 2016-09-13 ENCOUNTER — Ambulatory Visit (INDEPENDENT_AMBULATORY_CARE_PROVIDER_SITE_OTHER): Payer: Medicare Other | Admitting: Neurology

## 2016-09-13 VITALS — BP 118/75 | HR 63 | Ht 67.5 in | Wt 186.0 lb

## 2016-09-13 DIAGNOSIS — E538 Deficiency of other specified B group vitamins: Secondary | ICD-10-CM | POA: Diagnosis not present

## 2016-09-13 DIAGNOSIS — G2581 Restless legs syndrome: Secondary | ICD-10-CM

## 2016-09-13 DIAGNOSIS — H532 Diplopia: Secondary | ICD-10-CM | POA: Diagnosis not present

## 2016-09-13 MED ORDER — HYDROCODONE-ACETAMINOPHEN 7.5-325 MG PO TABS: 1.0000 | ORAL_TABLET | Freq: Four times a day (QID) | ORAL | 0 refills | Status: DC | PRN

## 2016-09-13 MED ORDER — ROPINIROLE HCL 1 MG PO TABS: ORAL_TABLET | ORAL | 3 refills | Status: DC

## 2016-09-13 NOTE — Progress Notes (Signed)
Reason for visit: Restless leg syndrome  Shelley Coleman is an 76 y.o. female  History of present illness:  Shelley Coleman is a 77 year old right-handed white female with a history of severe restless leg syndrome. She has gained some benefit with Requip, but she still has issues. IV iron therapy in the past has been very effective, but her insurance company has denied the use of this medication in the fall of 2017. The patient has been given some hydrocodone to take, this also helps some. The patient still does not rest well frequently. The patient has more recently noted some problems with double vision that will come and go, it is not present at all times. The patient denies any ptosis of the eyes. She has had prior lumbosacral spine surgery with a very extensive procedure done in the past. Within 10-15 minutes after sitting down or trying to go to bed the restless leg symptoms will begin. She has also noted some decreased hearing in the right greater left ear, she wears a hearing aid. The patient has had some problems tolerating the statin drug for her dyslipidemia, she was on Lipitor. She had to stop the medication secondary to neuromuscular discomfort. She has not contacted her primary care physician about this. She has not had a cholesterol level checked since her stroke in July 2017.  Past Medical History:  Diagnosis Date  . Anemia    Has had iron infusions  . Breast cancer ([place])    right  . Depression   . Depression   . GERD (gastroesophageal reflux disease)   . Hiatal hernia   . History of GI bleed   . Hx of mitral valve prolapse   . Hyperlipidemia   . Irregular heart beat   . Neuropathy    numbness in both feet  . Nocturnal leg cramps 09/02/2015  . Optic neuritis    left  . Osteopenia   . Ovarian cyst   . Parotid tumor    right  . Pelvic fracture ([place])   . Restless legs syndrome   . Shingles 2000   Left in V1  . Stroke [facility])     Past Surgical History:  Procedure  Laterality Date  . ABDOMINAL HYSTERECTOMY    . APPENDECTOMY    . BREAST LUMPECTOMY Left   . CATARACT EXTRACTION    . ESOPHAGEAL MANOMETRY N/A 01/13/2013   Procedure: ESOPHAGEAL MANOMETRY (EM);  Surgeon: Sable Feil, MD;  Location: [facility] ENDOSCOPY;  Service: Endoscopy;  Laterality: N/A;  . HERNIA REPAIR  08/2009  . MASTECTOMY Right   . SPINE SURGERY    . TOTAL HIP ARTHROPLASTY Left     Family History  Problem Relation Age of Onset  . Cancer Mother        Angio sarcoma   . Heart disease Father   . Hypertension Father   . Stroke Father        x2  . Heart attack Father        x 11  . Hernia Brother   . Hypertension Brother     Social history:  reports that she has never smoked. She has never used smokeless tobacco. She reports that she drinks about 0.6 oz of alcohol per week . She reports that she does not use drugs.    Allergies  Allergen Reactions  . Diflunisal     REACTION: rash , swelling  anaphlaxsis  . Lipitor [Atorvastatin]     Pt had body joint pain  .  Tetanus Toxoid     REACTION: arm swelling, fever    Medications:  Prior to Admission medications   Medication Sig Start Date End Date Taking? Authorizing Provider  ALPRAZolam Duanne Moron) 1 MG tablet TAKE ONE TABLET BY MOUTH AT BEDTIME 07/11/16  Yes Nafziger, Tommi Rumps, NP  aspirin 325 MG tablet Take 1 tablet (325 mg total) by mouth daily. 11/02/15  Yes Reyne Dumas, MD  atenolol (TENORMIN) 50 MG tablet TAKE ONE TABLET BY MOUTH ONCE DAILY 07/26/16  Yes Nafziger, Tommi Rumps, NP  buPROPion (WELLBUTRIN SR) 150 MG 12 hr tablet TAKE ONE TABLET BY MOUTH TWICE DAILY 07/11/16  Yes Nafziger, Tommi Rumps, NP  DEXILANT 60 MG capsule TAKE ONE CAPSULE BY MOUTH ONCE DAILY 07/11/16  Yes Nafziger, Tommi Rumps, NP  hydrochlorothiazide (HYDRODIURIL) 25 MG tablet Take 1 tablet (25 mg total) by mouth daily. 04/28/16  Yes Nafziger, Tommi Rumps, NP  HYDROcodone-acetaminophen (NORCO) 7.5-325 MG tablet Take 1 tablet by mouth every 6 (six) hours as needed. 06/26/16  Yes Kathrynn Ducking, MD  MELATONIN PO Take 1 tablet by mouth at bedtime as needed (for sleep).    Yes [provider]  Multiple Vitamin (MULTIVITAMIN) tablet Take 1 tablet by mouth daily.    Yes [provider]  NITROSTAT 0.4 MG SL tablet PLACE ONE TABLET UNDER THE TONGUE EVERY 5 MINUTES AS NEEDED. 11/16/14  Yes Dorena Cookey, MD  rOPINIRole (REQUIP) 1 MG tablet One tablet at 5 pm, one tablet at bed time Patient taking differently: Take 1 mg by mouth daily. At 4 pm 09/02/15  Yes Kathrynn Ducking, MD  rOPINIRole (REQUIP) 3 MG tablet TAKE ONE TABLET BY MOUTH ONCE DAILY AT BEDTIME 11/30/15  Yes Kathrynn Ducking, MD    ROS:  Out of a complete 14 system review of symptoms, the patient complains only of the following symptoms, and all other reviewed systems are negative.  Fatigue Hearing loss Double vision Palpitations of the heart, heart murmur Restless legs, insomnia, frequent waking Joint pain, achy muscles, muscle cramps Bruising easily Numbness, weakness Depression  Blood pressure 118/75, pulse 63, height 5' 7.5" (1.715 m), weight 186 lb (84.4 kg).  Physical Exam  General: The patient is alert and cooperative at the time of the examination.  Skin: No significant peripheral edema is noted.   Neurologic Exam  Mental status: The patient is alert and oriented x 3 at the time of the examination. The patient has apparent normal recent and remote memory, with an apparently normal attention span and concentration ability.   Cranial nerves: Facial symmetry is present. Speech is normal, no aphasia or dysarthria is noted. Extraocular movements are full. Visual fields are full.  Motor: The patient has good strength in all 4 extremities.  Sensory examination: Soft touch sensation is symmetric on the face, arms, and legs.  Coordination: The patient has good finger-nose-finger and heel-to-shin bilaterally.  Gait and station: The patient has a normal gait. Tandem gait is minimally  unsteady. Romberg is negative. No drift is seen.  Reflexes: Deep tendon reflexes are symmetric.   Assessment/Plan:   1. Restless leg syndrome  2. Reports of double vision  The patient will be set up for blood work today to evaluate the double vision problem. The patient was given a prescription for hydrocodone and the 1 mg tablets of the Requip. The patient will follow-up in 6 months. I will contact her concerning the results of the blood work.  Jill Alexanders MD 09/13/2016 10:12 AM  [facility] [id number]  [address] [place], [place] [address]  Phone [phone] Fax [phone]

## 2016-09-16 LAB — ANGIOTENSIN CONVERTING ENZYME: Angio Convert Enzyme: 36 U/L (ref 14–82)

## 2016-09-16 LAB — ANA W/REFLEX: Anti Nuclear Antibody(ANA): NEGATIVE

## 2016-09-16 LAB — VITAMIN B12: VITAMIN B 12: 425 pg/mL (ref 232–1245)

## 2016-09-16 LAB — ACETYLCHOLINE RECEPTOR, BINDING: AChR Binding Ab, Serum: <0.03 nmol/L (ref 0.00–0.24)

## 2016-09-16 LAB — SEDIMENTATION RATE: SED RATE: 13 mm/h (ref 0–40)

## 2016-10-04 ENCOUNTER — Other Ambulatory Visit: Payer: Self-pay | Admitting: Adult Health

## 2016-10-04 NOTE — Telephone Encounter (Signed)
Last refill 07/11/16 and last office visit 04/23/16.  Okay to fill?

## 2016-10-04 NOTE — Telephone Encounter (Signed)
Ok to refill 

## 2016-10-09 ENCOUNTER — Other Ambulatory Visit: Payer: Self-pay | Admitting: Family Medicine

## 2016-10-10 MED ORDER — NITROGLYCERIN 0.4 MG SL SUBL: SUBLINGUAL_TABLET | SUBLINGUAL | 0 refills | Status: DC

## 2016-10-10 NOTE — Telephone Encounter (Signed)
Ok to refill 

## 2016-10-16 DIAGNOSIS — N390 Urinary tract infection, site not specified: Secondary | ICD-10-CM | POA: Diagnosis not present

## 2016-10-16 DIAGNOSIS — A499 Bacterial infection, unspecified: Secondary | ICD-10-CM | POA: Diagnosis not present

## 2016-10-16 DIAGNOSIS — K219 Gastro-esophageal reflux disease without esophagitis: Secondary | ICD-10-CM | POA: Diagnosis not present

## 2016-10-30 DIAGNOSIS — N390 Urinary tract infection, site not specified: Secondary | ICD-10-CM | POA: Diagnosis not present

## 2016-10-30 DIAGNOSIS — A499 Bacterial infection, unspecified: Secondary | ICD-10-CM | POA: Diagnosis not present

## 2016-10-30 DIAGNOSIS — R3129 Other microscopic hematuria: Secondary | ICD-10-CM | POA: Diagnosis not present

## 2016-10-30 DIAGNOSIS — N281 Cyst of kidney, acquired: Secondary | ICD-10-CM | POA: Diagnosis not present

## 2016-11-06 DIAGNOSIS — N3001 Acute cystitis with hematuria: Secondary | ICD-10-CM | POA: Diagnosis not present

## 2016-11-06 DIAGNOSIS — N281 Cyst of kidney, acquired: Secondary | ICD-10-CM | POA: Diagnosis not present

## 2016-11-15 ENCOUNTER — Encounter (HOSPITAL_COMMUNITY): Payer: Self-pay | Admitting: Emergency Medicine

## 2016-11-15 ENCOUNTER — Emergency Department (HOSPITAL_COMMUNITY): Payer: Medicare Other

## 2016-11-15 ENCOUNTER — Emergency Department (HOSPITAL_COMMUNITY)
Admission: EM | Admit: 2016-11-15 | Discharge: 2016-11-15 | Disposition: A | Discharge status: Home / Self Care | Payer: Medicare Other | Attending: Emergency Medicine | Admitting: Emergency Medicine

## 2016-11-15 DIAGNOSIS — Y998 Other external cause status: Secondary | ICD-10-CM | POA: Diagnosis not present

## 2016-11-15 DIAGNOSIS — X500XXA Overexertion from strenuous movement or load, initial encounter: Secondary | ICD-10-CM | POA: Insufficient documentation

## 2016-11-15 DIAGNOSIS — Y9301 Activity, walking, marching and hiking: Secondary | ICD-10-CM | POA: Diagnosis not present

## 2016-11-15 DIAGNOSIS — S6991XA Unspecified injury of right wrist, hand and finger(s), initial encounter: Secondary | ICD-10-CM | POA: Diagnosis not present

## 2016-11-15 DIAGNOSIS — Z96642 Presence of left artificial hip joint: Secondary | ICD-10-CM | POA: Insufficient documentation

## 2016-11-15 DIAGNOSIS — M25531 Pain in right wrist: Secondary | ICD-10-CM | POA: Diagnosis not present

## 2016-11-15 DIAGNOSIS — Y92007 Garden or yard of unspecified non-institutional (private) residence as the place of occurrence of the external cause: Secondary | ICD-10-CM | POA: Diagnosis not present

## 2016-11-15 DIAGNOSIS — Z7982 Long term (current) use of aspirin: Secondary | ICD-10-CM | POA: Diagnosis not present

## 2016-11-15 DIAGNOSIS — Z79899 Other long term (current) drug therapy: Secondary | ICD-10-CM | POA: Diagnosis not present

## 2016-11-15 DIAGNOSIS — S52501A Unspecified fracture of the lower end of right radius, initial encounter for closed fracture: Secondary | ICD-10-CM | POA: Diagnosis not present

## 2016-11-15 DIAGNOSIS — S59911A Unspecified injury of right forearm, initial encounter: Secondary | ICD-10-CM | POA: Diagnosis present

## 2016-11-15 MED ORDER — MORPHINE SULFATE (PF) 4 MG/ML IV SOLN
4.0000 mg | Freq: Once | INTRAVENOUS | Status: AC
  Administered 2016-11-15: 4 mg via INTRAMUSCULAR
  Filled 2016-11-15: qty 1

## 2016-11-15 MED ORDER — OXYCODONE-ACETAMINOPHEN 5-325 MG PO TABS
1.0000 | ORAL_TABLET | Freq: Once | ORAL | Status: AC
  Administered 2016-11-15: 1 via ORAL
  Filled 2016-11-15: qty 1

## 2016-11-15 MED ORDER — OXYCODONE-ACETAMINOPHEN 5-325 MG PO TABS: 1.0000 | ORAL_TABLET | ORAL | 0 refills | Status: DC | PRN

## 2016-11-15 NOTE — Progress Notes (Signed)
Orthopedic Tech Progress Note Patient Details:  Shelley Coleman [birth year] [id number]  Ortho Devices Type of Ortho Device: Arm sling, Sugartong splint, Ace wrap   [name] 11/15/2016, 9:36 PM

## 2016-11-15 NOTE — ED Provider Notes (Signed)
[facility] DEPT Provider Note   CSN: [id number] Arrival date & time: 11/15/16  [place]   History   Chief Complaint Chief Complaint  Patient presents with  . Fall    HPI Shelley Coleman is a 77 y.o.left handed female who presents with right wrist pain after a mechanical fall earlier today. She states she was trying to move a tiki torch in her backyard and lost her balance and fell backwards and used her hands to stop her fall. She reports hx of balance issues due to prior CVA. She states she had immediate pain and swelling in the right wrist. She had difficulty getting up due to the pain. She denies any pain in her hips or back. She does not not hit her head or lose consciousness. She denies any previous fracture. She takes full strength ASA daily.  HPI  Past Medical History:  Diagnosis Date  . Anemia    Has had iron infusions  . Breast cancer ([place])    right  . Depression   . Depression   . GERD (gastroesophageal reflux disease)   . Hiatal hernia   . History of GI bleed   . Hx of mitral valve prolapse   . Hyperlipidemia   . Irregular heart beat   . Neuropathy    numbness in both feet  . Nocturnal leg cramps 09/02/2015  . Optic neuritis    left  . Osteopenia   . Ovarian cyst   . Parotid tumor    right  . Pelvic fracture ([place])   . Restless legs syndrome   . Shingles 2000   Left in V1  . Stroke [facility])     Patient Active Problem List   Diagnosis Date Noted  . Paresthesia 11/12/2015  . Right arm numbness   . Complicated migraine   . Anxiety state   . Acute ischemic stroke ([place]) 11/01/2015  . Intermittent tingling of right hand and foot   . HLD (hyperlipidemia)   . RLS (restless legs syndrome)   . Nocturnal leg cramps 09/02/2015  . Elevated blood pressure 01/25/2015  . Fatigue due to depression 06/03/2014  . Dysplastic nevus of trunk 04/01/2012  . Depression 03/19/2012  . Thoracic or lumbosacral neuritis or radiculitis, unspecified 01/10/2012  . Scoliosis (and  kyphoscoliosis), idiopathic 01/10/2012  . Malignant neoplasm of breast (female), unspecified site 01/10/2012  . Essential and other specified forms of tremor 01/10/2012  . Optic neuritis, unspecified 01/10/2012  . Other specified visual disturbances 01/10/2012  . Restless legs syndrome (RLS) 01/10/2012  . Pain in limb 01/10/2012  . Lumbosacral root lesions, not elsewhere classified 01/10/2012  . Back pain 06/27/2011  . Dysphasia 06/27/2011  . PERIPHERAL NEUROPATHY 07/13/2009  . CARCINOMA, BASAL CELL, BACK 10/05/2007  . BREAST CANCER, HX OF 08/01/2007  . Hyperlipidemia 03/07/2007  . GERD 03/07/2007    Past Surgical History:  Procedure Laterality Date  . ABDOMINAL HYSTERECTOMY    . APPENDECTOMY    . BREAST LUMPECTOMY Left   . CATARACT EXTRACTION    . ESOPHAGEAL MANOMETRY N/A 01/13/2013   Procedure: ESOPHAGEAL MANOMETRY (EM);  Surgeon: Sable Feil, MD;  Location: [facility] ENDOS[facility];  Service: Endoscopy;  Laterality: N/A;  . HERNIA REPAIR  08/2009  . MASTECTOMY Right   . SPINE SURGERY    . TOTAL HIP ARTHROPLASTY Left     OB History    No data available       Home Medications    Prior to Admission medications   Medication Sig  Start Date End Date Taking? Authorizing Provider  ALPRAZolam Duanne Moron) 1 MG tablet TAKE 1 TABLET BY MOUTH AT BEDTIME 10/04/16   Nafziger, Tommi Rumps, NP  aspirin 325 MG tablet Take 1 tablet (325 mg total) by mouth daily. 11/02/15   Reyne Dumas, MD  atenolol (TENORMIN) 50 MG tablet TAKE ONE TABLET BY MOUTH ONCE DAILY 07/26/16   Nafziger, Tommi Rumps, NP  buPROPion [facility] SR) 150 MG 12 hr tablet TAKE ONE TABLET BY MOUTH TWICE DAILY 07/11/16   Nafziger, Tommi Rumps, NP  DEXILANT 60 MG capsule TAKE ONE CAPSULE BY MOUTH ONCE DAILY 07/11/16   Nafziger, Tommi Rumps, NP  hydrochlorothiazide (HYDRODIURIL) 25 MG tablet Take 1 tablet (25 mg total) by mouth daily. 04/28/16   Nafziger, Tommi Rumps, NP  HYDROcodone-acetaminophen (NORCO) 7.5-325 MG tablet Take 1 tablet by mouth every 6 (six) hours as  needed. 09/13/16   Kathrynn Ducking, MD  MELATONIN PO Take 1 tablet by mouth at bedtime as needed (for sleep).     [provider]  Multiple Vitamin (MULTIVITAMIN) tablet Take 1 tablet by mouth daily.     [provider]  nitroGLYCERIN (NITROSTAT) 0.4 MG SL tablet PLACE ONE TABLET UNDER THE TONGUE EVERY 5 MINUTES AS NEEDED. 10/10/16   Dorothyann Peng, NP  rOPINIRole (REQUIP) 1 MG tablet One tablet at 5 pm, one tablet at bed time 09/13/16   Kathrynn Ducking, MD  rOPINIRole (REQUIP) 3 MG tablet TAKE ONE TABLET BY MOUTH ONCE DAILY AT BEDTIME 11/30/15   Kathrynn Ducking, MD    Family History Family History  Problem Relation Age of Onset  . Cancer Mother        Angio sarcoma   . Heart disease Father   . Hypertension Father   . Stroke Father        x2  . Heart attack Father        x 11  . Hernia Brother   . Hypertension Brother     Social History Social History  Substance Use Topics  . Smoking status: Never Smoker  . Smokeless tobacco: Never Used  . Alcohol use 0.6 oz/week    1 Glasses of wine per week     Comment: Rarely,socially     Allergies   Diflunisal; Lipitor [atorvastatin]; and Tetanus toxoid   Review of Systems Review of Systems  Musculoskeletal: Positive for arthralgias (R wrist) and joint swelling. Negative for back pain and gait problem.  Skin: Negative for wound.  Neurological: Positive for weakness (chronic right sided due to CVA). Negative for dizziness, syncope, numbness and headaches.  Hematological: Bruises/bleeds easily (on ASA).  All other systems reviewed and are negative.    Physical Exam Updated Vital Signs BP (!) 148/85 (BP Location: Right Arm)   Pulse 70   Resp 18   Wt 80.7 kg (178 lb)   SpO2 100%   BMI 27.47 kg/m   Physical Exam  Constitutional: She is oriented to person, place, and time. She appears well-developed and well-nourished. No distress.  HENT:  Head: Normocephalic and atraumatic.  Eyes: Pupils are equal,  round, and reactive to light. Conjunctivae are normal. Right eye exhibits no discharge. Left eye exhibits no discharge. No scleral icterus.  Neck: Normal range of motion.  Cardiovascular: Normal rate.   Pulmonary/Chest: Effort normal. No respiratory distress.  Abdominal: She exhibits no distension.  Musculoskeletal:  Right arm: Guarding and holding wrist to chest, in pain. Diffuse wrist swelling. Significant tenderness with light palpation. Able to wiggle fingers. 2+ radial pulse  Neurological:  She is alert and oriented to person, place, and time.  Skin: Skin is warm and dry.  Psychiatric: She has a normal mood and affect. Her behavior is normal.  Nursing note and vitals reviewed.    ED Treatments / Results  Labs (all labs ordered are listed, but only abnormal results are displayed) Labs Reviewed - No data to display  EKG  EKG Interpretation None       Radiology Dg Wrist Complete Right  Result Date: 11/15/2016 CLINICAL DATA:  Pain following fall EXAM: RIGHT WRIST - COMPLETE 3+ VIEW COMPARISON:  None. FINDINGS: Frontal, oblique, lateral, and ulnar deviation scaphoid images were obtained. There is a comminuted fracture of the distal radial metaphysis with dorsal angulation bilaterally. No other fracture evident. No dislocation. There is calcification in the triangular fibrocartilage. There is advanced osteoarthritis in the first carpal -metacarpal joint. There is moderate osteoarthritic change in the scaphotrapezial joint. No erosive changes. IMPRESSION: Comminuted fracture distal radial metaphysis with dorsal angulation distally. No other fracture. No dislocation. Calcification in the triangular fibrocartilage region raises question of chronic tear in this area. Areas of osteoarthritic change, most marked in the first carpal-metacarpal joint. Electronically Signed   By: Lowella Grip III M.D.   On: 11/15/2016 19:47    Procedures Procedures (including critical care time)  SPLINT  APPLICATION Date/Time: 9/47/6546, 9:00PM Authorized by: Recardo Evangelist Consent: Verbal consent obtained. Risks and benefits: risks, benefits and alternatives were discussed Consent given by: patient Splint applied by: orthopedic technician Location details: right wrist Splint type: sugartong Supplies used: sling, splint material, ACE wrap Post-procedure: The splinted body part was neurovascularly unchanged following the procedure. Patient tolerance: Patient tolerated the procedure well with no immediate complications.    Medications Ordered in ED Medications  oxyCODONE-acetaminophen (PERCOCET/ROXICET) 5-325 MG per tablet 1 tablet (1 tablet Oral Given 11/15/16 2030)  morphine 4 MG/ML injection 4 mg (4 mg Intramuscular Given 11/15/16 2146)     Initial Impression / Assessment and Plan / ED Course  I have reviewed the triage vital signs and the nursing notes.  Pertinent labs & imaging results that were available during my care of the patient were reviewed by me and considered in my medical decision making (see chart for details).  76 year old female with R comminuted distal radius fx with ~30 degree dorsal angulation. Pain medication was given. Discussed with Dr. Caralyn Guile who recommends splint and office f/u vs reduction in the ED if pt is very uncomfortable. Shared visit with Dr. Eulis Foster.  Pt will be splinted and will have her f/u with Dr. Caralyn Guile. Pain med rx given. Return precautions given.  Final Clinical Impressions(s) / ED Diagnoses   Final diagnoses:  Closed fracture of distal end of right radius, unspecified fracture morphology, initial encounter    New Prescriptions New Prescriptions   No medications on file     Iris Pert 11/16/16 0015    Daleen Bo, MD 11/16/16 [phone]

## 2016-11-15 NOTE — ED Triage Notes (Signed)
Pt states she was on her patio and lost her balance and fell backward catching herself with her hands  Pt is c/o right wrist pain  Pt has swelling noted to her wrist  Ice pack applied in triage  Pt denies hitting her head  Denies neck or back pain

## 2016-11-15 NOTE — ED Provider Notes (Signed)
  Face-to-face evaluation   History: Patient lost balance, fell backwards injuring right wrist only.  She feels her balance was off because of her prior stroke.  No other significant injuries.  Physical exam: Right wrist, with deformity, and tenderness, but normal peripheral sensation and circulation.  Patient was offered fracture reduction in the ED and declined.  She will follow up with hand surgery for definitive management.  Medical screening examination/treatment/procedure(s) were conducted as a shared visit with non-physician practitioner(s) and myself.  I personally evaluated the patient during the encounter    Daleen Bo, MD 11/16/16 [phone]

## 2016-11-15 NOTE — ED Notes (Signed)
Pt reports she was pulling on a tiki torch to move, her hand slipped, falling backwards, using her R hand to catch her fall.  Swelling noted to her R wrist.

## 2016-11-15 NOTE — Discharge Instructions (Signed)
Please take pain medicine as needed -Percocet was taken and 8:30PM -Morphine was given around 10PM -You can take another Percocet at 12:00AM and then every 4-6 hours afterwards  Avoid other sedating medicines while taking pain medication Call Dr. Angus Palms office tomorrow Return for worsening symptoms

## 2016-11-16 DIAGNOSIS — G8918 Other acute postprocedural pain: Secondary | ICD-10-CM | POA: Diagnosis not present

## 2016-11-16 DIAGNOSIS — Y999 Unspecified external cause status: Secondary | ICD-10-CM | POA: Diagnosis not present

## 2016-11-16 DIAGNOSIS — S52571A Other intraarticular fracture of lower end of right radius, initial encounter for closed fracture: Secondary | ICD-10-CM | POA: Diagnosis not present

## 2016-11-30 DIAGNOSIS — S52571D Other intraarticular fracture of lower end of right radius, subsequent encounter for closed fracture with routine healing: Secondary | ICD-10-CM | POA: Diagnosis not present

## 2016-12-14 DIAGNOSIS — S52571D Other intraarticular fracture of lower end of right radius, subsequent encounter for closed fracture with routine healing: Secondary | ICD-10-CM | POA: Diagnosis not present

## 2016-12-14 DIAGNOSIS — M25531 Pain in right wrist: Secondary | ICD-10-CM | POA: Diagnosis not present

## 2016-12-21 DIAGNOSIS — M25531 Pain in right wrist: Secondary | ICD-10-CM | POA: Diagnosis not present

## 2016-12-28 DIAGNOSIS — M25531 Pain in right wrist: Secondary | ICD-10-CM | POA: Diagnosis not present

## 2017-01-04 DIAGNOSIS — M25531 Pain in right wrist: Secondary | ICD-10-CM | POA: Diagnosis not present

## 2017-01-11 DIAGNOSIS — M25531 Pain in right wrist: Secondary | ICD-10-CM | POA: Diagnosis not present

## 2017-01-12 DIAGNOSIS — M1811 Unilateral primary osteoarthritis of first carpometacarpal joint, right hand: Secondary | ICD-10-CM | POA: Diagnosis not present

## 2017-01-12 DIAGNOSIS — S52571D Other intraarticular fracture of lower end of right radius, subsequent encounter for closed fracture with routine healing: Secondary | ICD-10-CM | POA: Diagnosis not present

## 2017-01-18 DIAGNOSIS — M25531 Pain in right wrist: Secondary | ICD-10-CM | POA: Diagnosis not present

## 2017-01-19 ENCOUNTER — Encounter: Payer: Self-pay | Admitting: Adult Health

## 2017-02-04 DIAGNOSIS — Z23 Encounter for immunization: Secondary | ICD-10-CM | POA: Diagnosis not present

## 2017-02-05 ENCOUNTER — Ambulatory Visit (INDEPENDENT_AMBULATORY_CARE_PROVIDER_SITE_OTHER): Payer: Medicare Other | Admitting: Adult Health

## 2017-02-05 ENCOUNTER — Telehealth: Payer: Self-pay | Admitting: Neurology

## 2017-02-05 ENCOUNTER — Encounter: Payer: Self-pay | Admitting: Adult Health

## 2017-02-05 VITALS — BP 134/90 | Temp 98.4°F | Ht 66.0 in | Wt 178.0 lb

## 2017-02-05 DIAGNOSIS — F329 Major depressive disorder, single episode, unspecified: Secondary | ICD-10-CM

## 2017-02-05 DIAGNOSIS — E782 Mixed hyperlipidemia: Secondary | ICD-10-CM

## 2017-02-05 DIAGNOSIS — G2581 Restless legs syndrome: Secondary | ICD-10-CM | POA: Diagnosis not present

## 2017-02-05 DIAGNOSIS — F419 Anxiety disorder, unspecified: Secondary | ICD-10-CM | POA: Diagnosis not present

## 2017-02-05 DIAGNOSIS — I10 Essential (primary) hypertension: Secondary | ICD-10-CM

## 2017-02-05 LAB — LIPID PANEL
CHOL/HDL RATIO: 5
Cholesterol: 273 mg/dL — ABNORMAL HIGH (ref 0–200)
HDL: 59 mg/dL (ref >39.00)
LDL CALC: 183 mg/dL — AB (ref 0–99)
NonHDL: 213.7
TRIGLYCERIDES: 153 mg/dL — AB (ref 0.0–149.0)
VLDL: 30.6 mg/dL (ref 0.0–40.0)

## 2017-02-05 LAB — HEPATIC FUNCTION PANEL
ALT: 15 U/L (ref 0–35)
AST: 14 U/L (ref 0–37)
Albumin: 4.2 g/dL (ref 3.5–5.2)
Alkaline Phosphatase: 83 U/L (ref 39–117)
BILIRUBIN DIRECT: 0.1 mg/dL (ref 0.0–0.3)
BILIRUBIN TOTAL: 0.6 mg/dL (ref 0.2–1.2)
Total Protein: 6.7 g/dL (ref 6.0–8.3)

## 2017-02-05 LAB — BASIC METABOLIC PANEL
BUN: 18 mg/dL (ref 6–23)
CO2: 28 mEq/L (ref 19–32)
Calcium: 9.6 mg/dL (ref 8.4–10.5)
Chloride: 105 mEq/L (ref 96–112)
Creatinine, Ser: 0.66 mg/dL (ref 0.40–1.20)
GFR: 92.9 mL/min (ref >60.00)
GLUCOSE: 93 mg/dL (ref 70–99)
Potassium: 4.6 mEq/L (ref 3.5–5.1)
SODIUM: 141 meq/L (ref 135–145)

## 2017-02-05 LAB — CBC WITH DIFFERENTIAL/PLATELET
BASOS ABS: 0.1 10*3/uL (ref 0.0–0.1)
Basophils Relative: 1 % (ref 0.0–3.0)
Eosinophils Absolute: 0.2 10*3/uL (ref 0.0–0.7)
Eosinophils Relative: 2.7 % (ref 0.0–5.0)
HCT: 41.9 % (ref 36.0–46.0)
Hemoglobin: 13.8 g/dL (ref 12.0–15.0)
LYMPHS ABS: 1.9 10*3/uL (ref 0.7–4.0)
Lymphocytes Relative: 27.8 % (ref 12.0–46.0)
MCHC: 32.9 g/dL (ref 30.0–36.0)
MCV: 90.3 fl (ref 78.0–100.0)
MONOS PCT: 8.1 % (ref 3.0–12.0)
Monocytes Absolute: 0.5 10*3/uL (ref 0.1–1.0)
NEUTROS PCT: 60.4 % (ref 43.0–77.0)
Neutro Abs: 4.1 10*3/uL (ref 1.4–7.7)
PLATELETS: 255 10*3/uL (ref 150.0–400.0)
RBC: 4.64 Mil/uL (ref 3.87–5.11)
RDW: 14.7 % (ref 11.5–15.5)
WBC: 6.8 10*3/uL (ref 4.0–10.5)

## 2017-02-05 LAB — TSH: TSH: 2 u[IU]/mL (ref 0.35–4.50)

## 2017-02-05 MED ORDER — CITALOPRAM HYDROBROMIDE 10 MG PO TABS: 10.0000 mg | ORAL_TABLET | Freq: Every day | ORAL | 2 refills | Status: DC

## 2017-02-05 MED ORDER — HYDROCODONE-ACETAMINOPHEN 7.5-325 MG PO TABS: 1.0000 | ORAL_TABLET | Freq: Four times a day (QID) | ORAL | 0 refills | Status: DC | PRN

## 2017-02-05 NOTE — Addendum Note (Signed)
Addended by: Kathrynn Ducking on: 02/05/2017 05:07 PM   Modules accepted: Orders

## 2017-02-05 NOTE — Telephone Encounter (Signed)
Pt has called for a refill of HYDROcodone-acetaminophen (NORCO) 7.5-325 MG tablet, if this can be filled pt is asking that [place], RN be able to pick up the prescription for her.

## 2017-02-05 NOTE — Patient Instructions (Signed)
It was so nice seeing you today   I will follow up with you regarding your blood work   I have sent in a prescription for Celexa 10 mg to help with depression. Please let me know how this works for you

## 2017-02-05 NOTE — Telephone Encounter (Signed)
The Rx for Hydrocodone has been given.

## 2017-02-05 NOTE — Progress Notes (Signed)
Subjective:    Patient ID: Shelley Coleman, female    DOB: 1940-09-13, 76 y.o.   MRN: [id number]  HPI  Patient presents for yearly preventative medicine examination. She is a pleasant 76 year old female who  has a past medical history of Anemia; Breast cancer ([place]); Depression; Depression; GERD (gastroesophageal reflux disease); Hiatal hernia; History of GI bleed; mitral valve prolapse; Hyperlipidemia; Irregular heart beat; Neuropathy; Nocturnal leg cramps (09/02/2015); Optic neuritis; Osteopenia; Ovarian cyst; Parotid tumor; Pelvic fracture ([place]); Restless legs syndrome; Shingles (2000); and Stroke [facility]).  All immunizations and health maintenance protocols were reviewed with the patient and needed orders were placed.  She takes Atenolol and HCTZ 25 mg for hypertension.   She is taking Requip for restless leg syndrome. She is seeing Dr. Jannifer Franklin with [facility]   She is using Xanax as needed for sleep ( 1/2 pill). Her last 30 day supply was 3 months ago.   She was prescribed Wellbutrin for depression. She was not finding any benefits with this ,so she tapered off it in April 2018. She continues to feel depressed but is not suicidal.   Appropriate screening laboratory values were ordered for the patient including screening of hyperlipidemia, renal function and hepatic function.  Medication reconciliation,  past medical history, social history, problem list and allergies were reviewed in detail with the patient  Goals were established with regard to weight loss, exercise, and  diet in compliance with medications. She is staying as active as she can. She does not follow a specific diet.   End of life planning was discussed.  He interval history includes that of a fall while in the garden, which she broke her right arm, she had surgery on this hand. She continues to do physical therapy and feels as though she is healing well.   She is up to date on her colonoscopy. She is going to get a  mammogram before the end of the year  Review of Systems  Constitutional: Negative.   HENT: Negative.   Eyes: Negative.   Respiratory: Negative.   Cardiovascular: Negative.   Gastrointestinal: Negative.   Endocrine: Negative.   Genitourinary: Negative.   Musculoskeletal: Positive for arthralgias and back pain.  Allergic/Immunologic: Negative.   Neurological: Negative.   Hematological: Negative.   Psychiatric/Behavioral: Negative for suicidal ideas. The patient is nervous/anxious.        Depression    All other systems reviewed and are negative.  Past Medical History:  Diagnosis Date  . Anemia    Has had iron infusions  . Breast cancer ([place])    right  . Depression   . Depression   . GERD (gastroesophageal reflux disease)   . Hiatal hernia   . History of GI bleed   . Hx of mitral valve prolapse   . Hyperlipidemia   . Irregular heart beat   . Neuropathy    numbness in both feet  . Nocturnal leg cramps 09/02/2015  . Optic neuritis    left  . Osteopenia   . Ovarian cyst   . Parotid tumor    right  . Pelvic fracture ([place])   . Restless legs syndrome   . Shingles 2000   Left in V1  . Stroke [facility])     Social History   Social History  . Marital status: Married    Spouse name: N/A  . Number of children: 2  . Years of education: 16   Occupational History  . Retired Therapist, sports  Social History Main Topics  . Smoking status: Never Smoker  . Smokeless tobacco: Never Used  . Alcohol use 0.6 oz/week    1 Glasses of wine per week     Comment: Rarely,socially  . Drug use: No  . Sexual activity: Not on file   Other Topics Concern  . Not on file   Social History Narrative   Retired from nursing    Widowed    Left-handed          Past Surgical History:  Procedure Laterality Date  . ABDOMINAL HYSTERECTOMY    . APPENDECTOMY    . BREAST LUMPECTOMY Left   . CATARACT EXTRACTION    . ESOPHAGEAL MANOMETRY N/A 01/13/2013   Procedure: ESOPHAGEAL MANOMETRY (EM);  Surgeon:  Sable Feil, MD;  Location: [facility];  Service: Endoscopy;  Laterality: N/A;  . HERNIA REPAIR  08/2009  . MASTECTOMY Right   . SPINE SURGERY    . TOTAL HIP ARTHROPLASTY Left     Family History  Problem Relation Age of Onset  . Cancer Mother        Angio sarcoma   . Heart disease Father   . Hypertension Father   . Stroke Father        x2  . Heart attack Father        x 11  . Hernia Brother   . Hypertension Brother     Allergies  Allergen Reactions  . Diflunisal     REACTION: rash , swelling  anaphlaxsis  . Lipitor [Atorvastatin]     Pt had body joint pain  . Tetanus Toxoid     REACTION: arm swelling, fever    Current Outpatient Prescriptions on File Prior to Visit  Medication Sig Dispense Refill  . ALPRAZolam (XANAX) 1 MG tablet TAKE 1 TABLET BY MOUTH AT BEDTIME 30 tablet 0  . aspirin 325 MG tablet Take 1 tablet (325 mg total) by mouth daily. 30 tablet 0  . atenolol (TENORMIN) 50 MG tablet TAKE ONE TABLET BY MOUTH ONCE DAILY 90 tablet 1  . DEXILANT 60 MG capsule TAKE ONE CAPSULE BY MOUTH ONCE DAILY 90 capsule 3  . hydrochlorothiazide (HYDRODIURIL) 25 MG tablet Take 1 tablet (25 mg total) by mouth daily. 30 tablet 1  . HYDROcodone-acetaminophen (NORCO) 7.5-325 MG tablet Take 1 tablet by mouth every 6 (six) hours as needed. 60 tablet 0  . MELATONIN PO Take 1 tablet by mouth at bedtime as needed (for sleep).     . Multiple Vitamin (MULTIVITAMIN) tablet Take 1 tablet by mouth daily.     . nitroGLYCERIN (NITROSTAT) 0.4 MG SL tablet PLACE ONE TABLET UNDER THE TONGUE EVERY 5 MINUTES AS NEEDED. 100 tablet 0  . rOPINIRole (REQUIP) 1 MG tablet One tablet at 5 pm, one tablet at bed time 180 tablet 3  . rOPINIRole (REQUIP) 3 MG tablet TAKE ONE TABLET BY MOUTH ONCE DAILY AT BEDTIME 90 tablet 3   No current facility-administered medications on file prior to visit.     BP 134/90 (BP Location: Left Arm)   Temp 98.4 F (36.9 C) (Oral)   Ht 5\' 6"  (1.676 m)   Wt 178 lb (80.7  kg)   BMI 28.73 kg/m      Objective:   Physical Exam  Constitutional: She is oriented to person, place, and time. She appears well-developed and well-nourished. No distress.  HENT:  Head: Normocephalic and atraumatic.  Right Ear: External ear normal.  Left Ear: External ear normal.  Nose: Nose normal.  Mouth/Throat: Oropharynx is clear and moist.  Eyes: Pupils are equal, round, and reactive to light. Conjunctivae and EOM are normal. Right eye exhibits no discharge. Left eye exhibits no discharge. No scleral icterus.  Neck: Normal range of motion. Neck supple. No JVD present. No tracheal deviation present. No thyromegaly present.  Cardiovascular: Normal rate, regular rhythm, normal heart sounds and intact distal pulses.  Exam reveals no gallop.   No murmur heard. Pulmonary/Chest: Effort normal and breath sounds normal. No stridor. No respiratory distress. She has no wheezes. She has no rales.  Abdominal: Soft. Bowel sounds are normal. She exhibits no distension and no mass. There is no tenderness. There is no rebound and no guarding.  Genitourinary:  Genitourinary Comments: Refused breast exam   Musculoskeletal: Normal range of motion. She exhibits deformity. She exhibits no edema or tenderness.  Lymphadenopathy:    She has no cervical adenopathy.  Neurological: She is alert and oriented to person, place, and time. She displays normal reflexes. No cranial nerve deficit. She exhibits normal muscle tone. Coordination normal.  Reflex Scores:      Patellar reflexes are 2+ on the right side and 3+ on the left side. Skin: Skin is warm and dry. No rash noted. She is not diaphoretic. No erythema. No pallor.  Psychiatric: She has a normal mood and affect. Her behavior is normal. Judgment and thought content normal.  Nursing note and vitals reviewed.     Assessment & Plan:  1. Mixed hyperlipidemia - Consider statin  - Educated on lifestyle modifications  - Basic metabolic panel - CBC with  Differential/Platelet - Hepatic function panel - Lipid panel - TSH  2. Restless legs syndrome (RLS) - Continue with recommendations from Neurology   3. Essential hypertension - Well controlled. No change in medication at this time  - Basic metabolic panel - CBC with Differential/Platelet - Hepatic function panel - Lipid panel - TSH  4. Anxiety and depression - Will trial her on celexa 10 mg. Follow up in 1 month - citalopram (CELEXA) 10 MG tablet; Take 1 tablet (10 mg total) by mouth daily.  Dispense: 30 tablet; Refill: [place], AGNP

## 2017-02-08 ENCOUNTER — Other Ambulatory Visit: Payer: Self-pay | Admitting: Family Medicine

## 2017-02-08 MED ORDER — PRAVASTATIN SODIUM 10 MG PO TABS: 10.0000 mg | ORAL_TABLET | Freq: Every day | ORAL | 1 refills | Status: DC

## 2017-02-08 NOTE — Telephone Encounter (Signed)
Sent to the pharmacy by e-scribe. 

## 2017-03-06 ENCOUNTER — Other Ambulatory Visit: Payer: Self-pay | Admitting: Adult Health

## 2017-03-08 NOTE — Telephone Encounter (Signed)
Sent to the pharmacy by e-scribe. 

## 2017-03-21 ENCOUNTER — Encounter: Payer: Self-pay | Admitting: Neurology

## 2017-03-21 ENCOUNTER — Ambulatory Visit (INDEPENDENT_AMBULATORY_CARE_PROVIDER_SITE_OTHER): Payer: Medicare Other | Admitting: Neurology

## 2017-03-21 VITALS — BP 129/78 | HR 66 | Ht 66.0 in | Wt 180.5 lb

## 2017-03-21 DIAGNOSIS — H81399 Other peripheral vertigo, unspecified ear: Secondary | ICD-10-CM

## 2017-03-21 DIAGNOSIS — H532 Diplopia: Secondary | ICD-10-CM | POA: Diagnosis not present

## 2017-03-21 DIAGNOSIS — G2581 Restless legs syndrome: Secondary | ICD-10-CM

## 2017-03-21 NOTE — Patient Instructions (Signed)
   We will check mri of the brain due to the vertigo and the double vision.

## 2017-03-21 NOTE — Progress Notes (Signed)
Reason for visit: Restless leg syndrome  Shelley Coleman is an 76 y.o. female  History of present illness:  Shelley Coleman is a 76 year old right-handed white female with a history of cerebrovascular disease.  The patient has problems with neuropathy, she has burning in the feet, she has restless leg syndrome associated with this.  The patient is on Requip taking 1 mg at 5 PM and 3 mg at night, she has some drowsiness in the morning from the medication.  The medication is not fully effective in controlling her symptoms.  In the past IV iron therapy has been helpful, but her insurance does not cover this.  The patient has had some ongoing issues with double vision that will come and go, blood work was unremarkable.  The patient has started having new symptoms with vertigo that began several weeks ago, the episodes may be severe with oscillopsia and severe vertigo lasting 15-20 seconds, the patient feels somewhat off balance between episodes.  She has taken meclizine with good improvement.  The patient will have occasional headaches as well, and nausea with the vertigo without vomiting.  She has not noted any new numbness or weakness of the face, arms, or legs.  Past Medical History:  Diagnosis Date  . Anemia    Has had iron infusions  . Breast cancer ([place])    right  . Depression   . Depression   . GERD (gastroesophageal reflux disease)   . Hiatal hernia   . History of GI bleed   . Hx of mitral valve prolapse   . Hyperlipidemia   . Irregular heart beat   . Neuropathy    numbness in both feet  . Nocturnal leg cramps 09/02/2015  . Optic neuritis    left  . Osteopenia   . Ovarian cyst   . Parotid tumor    right  . Pelvic fracture ([place])   . Restless legs syndrome   . Shingles 2000   Left in V1  . Stroke [facility])     Past Surgical History:  Procedure Laterality Date  . ABDOMINAL HYSTERECTOMY    . APPENDECTOMY    . BREAST LUMPECTOMY Left   . CATARACT EXTRACTION    . ESOPHAGEAL MANOMETRY  N/A 01/13/2013   Procedure: ESOPHAGEAL MANOMETRY (EM);  Surgeon: Sable Feil, MD;  Location: [facility];  Service: Endoscopy;  Laterality: N/A;  . HERNIA REPAIR  08/2009  . MASTECTOMY Right   . SPINE SURGERY    . TOTAL HIP ARTHROPLASTY Left     Family History  Problem Relation Age of Onset  . Cancer Mother        Angio sarcoma   . Heart disease Father   . Hypertension Father   . Stroke Father        x2  . Heart attack Father        x 11  . Hernia Brother   . Hypertension Brother     Social history:  reports that  has never smoked. she has never used smokeless tobacco. She reports that she drinks about 0.6 oz of alcohol per week. She reports that she does not use drugs.    Allergies  Allergen Reactions  . Diflunisal     REACTION: rash , swelling  anaphlaxsis  . Lipitor [Atorvastatin]     Pt had body joint pain  . Tetanus Toxoid     REACTION: arm swelling, fever    Medications:  Prior to Admission medications   Medication  Sig Start Date End Date Taking? Authorizing Provider  ALPRAZolam (XANAX) 1 MG tablet TAKE 1 TABLET BY MOUTH AT BEDTIME Patient taking differently: TAKE 1 TABLET BY MOUTH AT BEDTIME as needed 10/04/16  Yes Nafziger, Tommi Rumps, NP  aspirin 325 MG tablet Take 1 tablet (325 mg total) by mouth daily. 11/02/15  Yes Reyne Dumas, MD  atenolol (TENORMIN) 50 MG tablet TAKE 1 TABLET BY MOUTH ONCE DAILY 03/08/17  Yes Nafziger, Tommi Rumps, NP  citalopram (CELEXA) 10 MG tablet Take 1 tablet (10 mg total) by mouth daily. 02/05/17  Yes Nafziger, Tommi Rumps, NP  DEXILANT 60 MG capsule TAKE ONE CAPSULE BY MOUTH ONCE DAILY 07/11/16  Yes Nafziger, Tommi Rumps, NP  hydrochlorothiazide (HYDRODIURIL) 25 MG tablet Take 1 tablet (25 mg total) by mouth daily. Patient taking differently: Take 25 mg by mouth as needed.  04/28/16  Yes Nafziger, Tommi Rumps, NP  HYDROcodone-acetaminophen (NORCO) 7.5-325 MG tablet Take 1 tablet by mouth every 6 (six) hours as needed. 02/05/17  Yes Kathrynn Ducking, MD  MELATONIN  PO Take 1 tablet by mouth at bedtime as needed (for sleep).    Yes [provider]  Multiple Vitamin (MULTIVITAMIN) tablet Take 1 tablet by mouth daily.    Yes [provider]  nitroGLYCERIN (NITROSTAT) 0.4 MG SL tablet PLACE ONE TABLET UNDER THE TONGUE EVERY 5 MINUTES AS NEEDED. 10/10/16  Yes Nafziger, Tommi Rumps, NP  pravastatin (PRAVACHOL) 10 MG tablet Take 1 tablet (10 mg total) by mouth daily. 02/08/17  Yes Nafziger, Tommi Rumps, NP  rOPINIRole (REQUIP) 1 MG tablet One tablet at 5 pm, one tablet at bed time Patient taking differently: Take 1 mg by mouth daily. One tablet at 5 pm 09/13/16  Yes Kathrynn Ducking, MD  rOPINIRole (REQUIP) 3 MG tablet TAKE ONE TABLET BY MOUTH ONCE DAILY AT BEDTIME 11/30/15  Yes Kathrynn Ducking, MD    ROS:  Out of a complete 14 system review of symptoms, the patient complains only of the following symptoms, and all other reviewed systems are negative.  Fatigue Hearing loss Double vision Restless legs, insomnia, frequent waking Joint pain, aching muscles, muscle cramps Vertigo Depression  Blood pressure 129/78, pulse 66, height 5\' 6"  (1.676 m), weight 180 lb 8 oz (81.9 kg).  Physical Exam  General: The patient is alert and cooperative at the time of the examination.  Skin: No significant peripheral edema is noted.   Neurologic Exam  Mental status: The patient is alert and oriented x 3 at the time of the examination. The patient has apparent normal recent and remote memory, with an apparently normal attention span and concentration ability.   Cranial nerves: Facial symmetry is present. Speech is normal, no aphasia or dysarthria is noted. Extraocular movements are full. Visual fields are full.  Cover test is minimally positive, the patient notes a diagonal movement of the vision.  Motor: The patient has good strength in all 4 extremities.  Sensory examination: Soft touch sensation is symmetric on the face, arms, and legs.  Coordination: The  patient has good finger-nose-finger and heel-to-shin bilaterally.  Gait and station: The patient has a normal gait. Tandem gait is slightly unsteady. Romberg is negative. No drift is seen.  Reflexes: Deep tendon reflexes are symmetric.   Assessment/Plan:  1.  Restless leg syndrome  2.  Peripheral neuropathy  3.  Double vision  4.  Episodic vertigo  Given the new symptoms of double vision and vertigo, the patient will undergo MRI evaluation of the brain.  The patient will continue  the Requip for the restless leg syndrome.  She sometimes will take hydrocodone in the evenings to help her rest as well.  The patient may benefit from Mestinon for the double vision if the MRI of the brain is unremarkable.  She will follow-up in 6 months.  Jill Alexanders MD 03/21/2017 3:43 PM  [facility] [address] [place], [place] [address]  Phone [phone] Fax [phone]

## 2017-03-28 DIAGNOSIS — Z96642 Presence of left artificial hip joint: Secondary | ICD-10-CM | POA: Diagnosis not present

## 2017-03-28 DIAGNOSIS — Z471 Aftercare following joint replacement surgery: Secondary | ICD-10-CM | POA: Diagnosis not present

## 2017-03-28 DIAGNOSIS — M25551 Pain in right hip: Secondary | ICD-10-CM | POA: Diagnosis not present

## 2017-03-31 ENCOUNTER — Ambulatory Visit
Admission: RE | Admit: 2017-03-31 | Discharge: 2017-03-31 | Disposition: A | Discharge status: Home / Self Care | Payer: Medicare Other | Source: Ambulatory Visit | Attending: Neurology | Admitting: Neurology

## 2017-03-31 DIAGNOSIS — H81399 Other peripheral vertigo, unspecified ear: Secondary | ICD-10-CM

## 2017-03-31 DIAGNOSIS — H532 Diplopia: Secondary | ICD-10-CM

## 2017-04-02 ENCOUNTER — Telehealth: Payer: Self-pay | Admitting: Neurology

## 2017-04-02 NOTE — Telephone Encounter (Signed)
  I called the patient.  MRI of the brain shows a moderate level of chronic ischemic changes, the changes have not worsened since July 2017.  No acute changes are seen.  The patient indicates that the dizziness is improving, she may use meclizine if she needs it.  The patient will contact me if the dizziness worsens.  MRI brain 04/01/17:  IMPRESSION:  This MRI of the brain without contrast shows the following: 1.    Chronic lacunar infarction involving the head of the left caudate and adjacent internal capsule, unchanged in appearance compared to the 11/01/2015 MRI. 2.    Chronic ischemic changes in the hemispheres, pons and thalamus, essentially unchanged when compared to the previous MRI.  3.    There are no acute findings and there are no changes in the brain compared to the MRI dated 11/01/2015

## 2017-06-18 ENCOUNTER — Other Ambulatory Visit: Payer: Self-pay | Admitting: Neurology

## 2017-06-18 ENCOUNTER — Other Ambulatory Visit: Payer: Self-pay | Admitting: Adult Health

## 2017-07-05 DIAGNOSIS — M79644 Pain in right finger(s): Secondary | ICD-10-CM | POA: Diagnosis not present

## 2017-07-05 DIAGNOSIS — S62610A Displaced fracture of proximal phalanx of right index finger, initial encounter for closed fracture: Secondary | ICD-10-CM | POA: Diagnosis not present

## 2017-07-11 IMAGING — DX DG CHEST 2V
2 series · 2 of 2 positions shown · non-contrast
Comparison: 09/09/2009

CLINICAL DATA: Right-sided weakness, leg spasms and right-sided
chest pain. History of breast cancer.

EXAM:
CHEST  2 VIEW

[w chest lat]
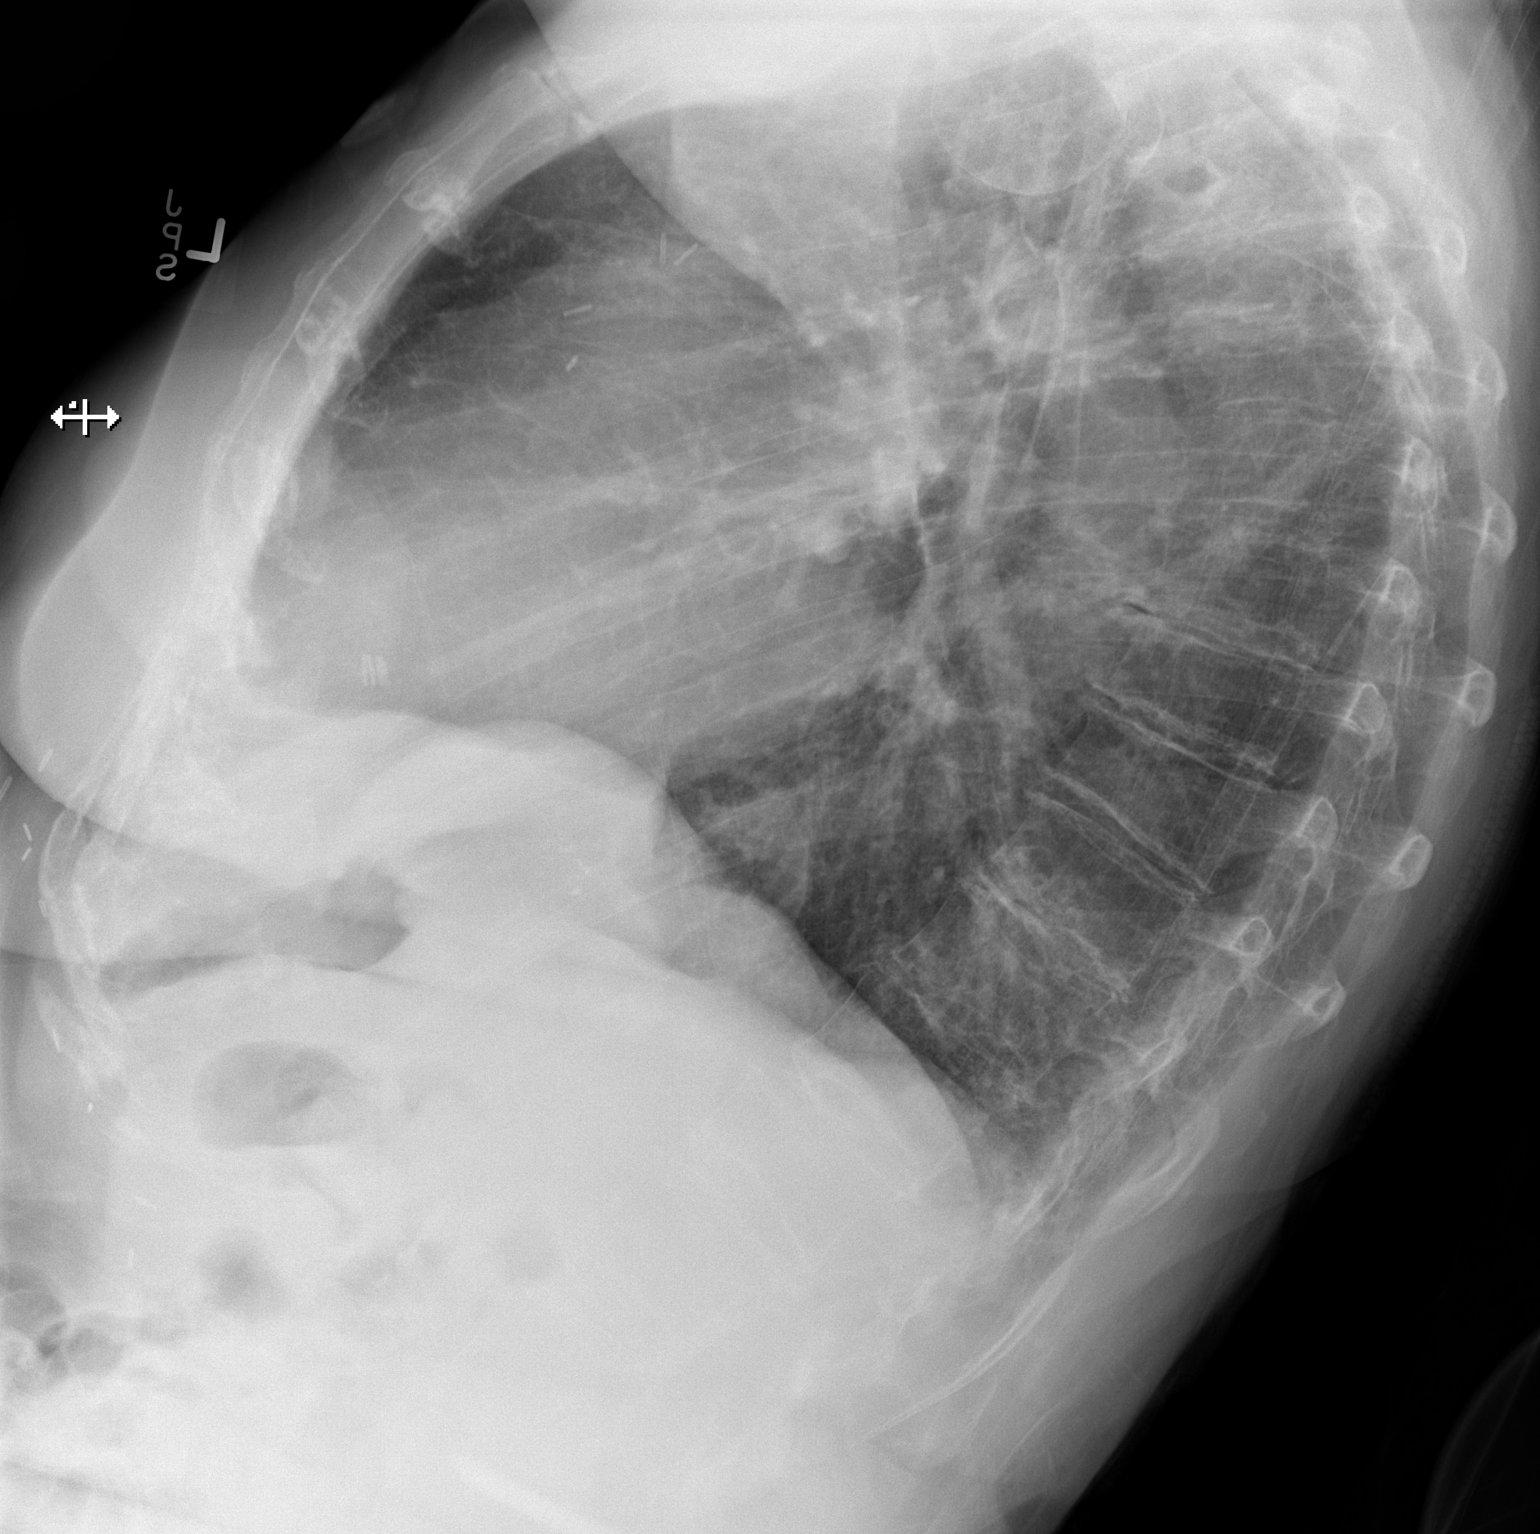

[x chest ap]
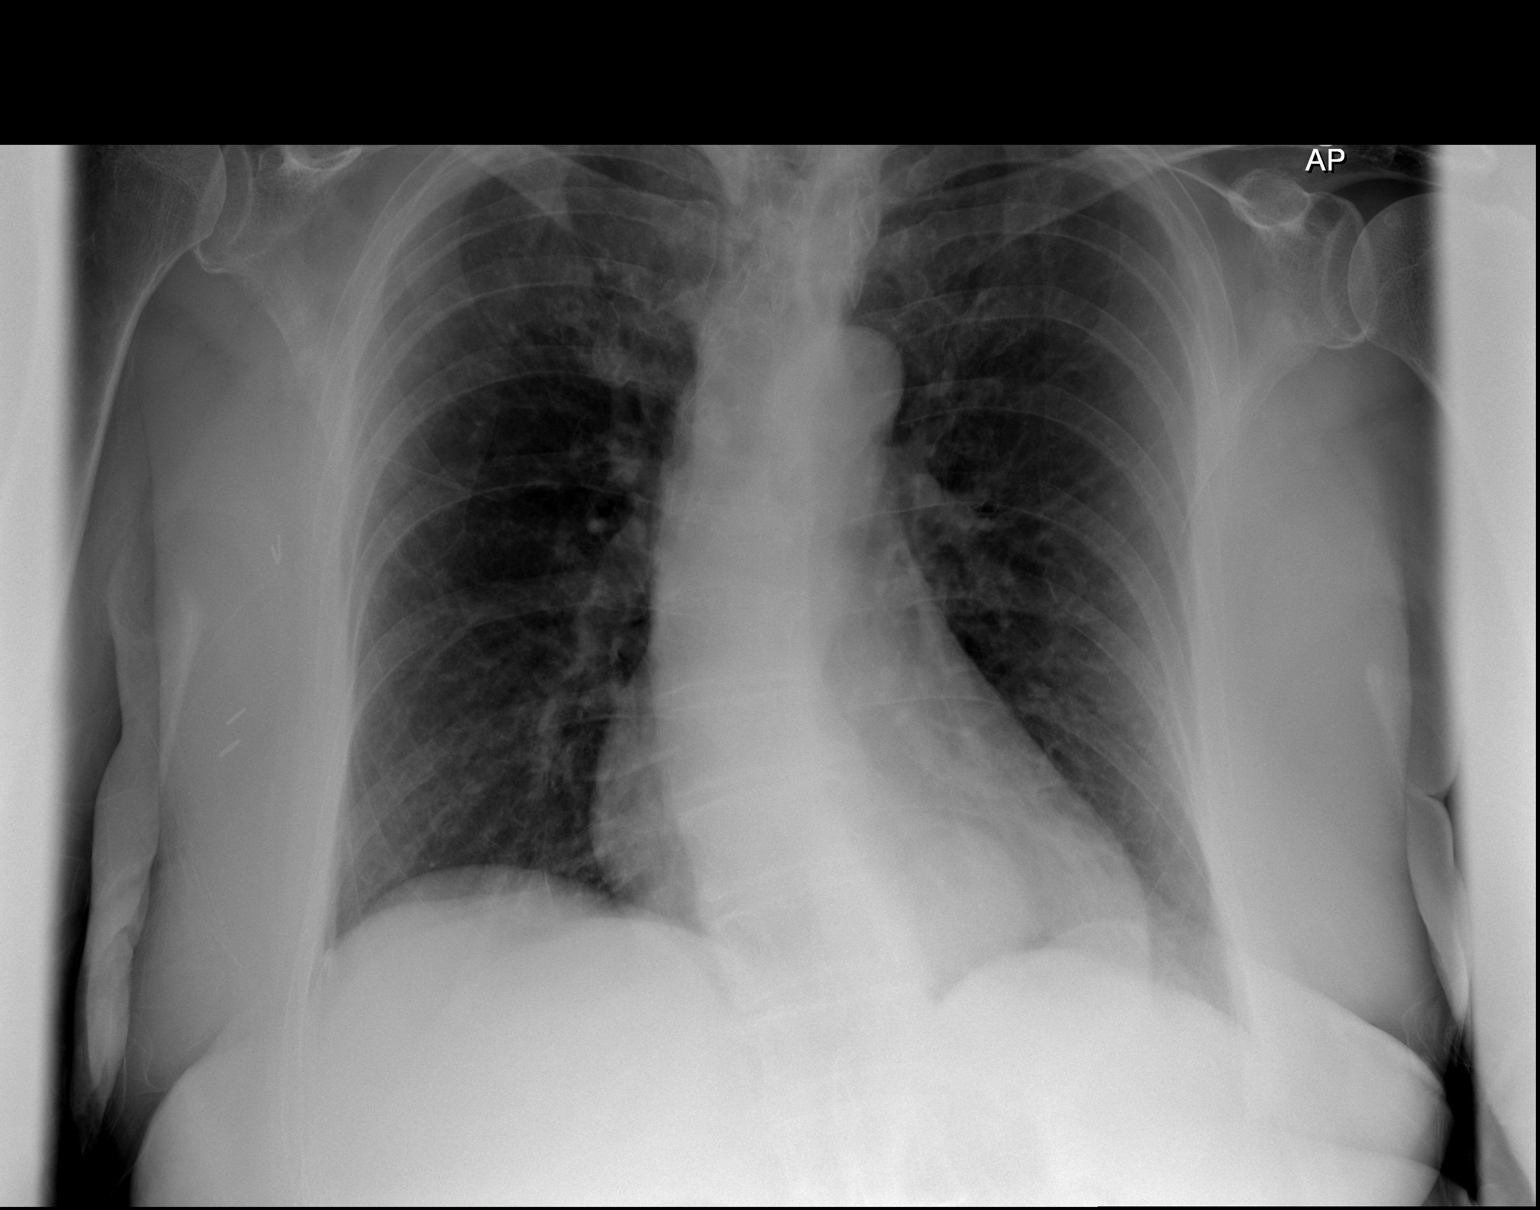

[2 of 2 positions shown; findings below may reference images not displayed]

FINDINGS: The heart is normal in size. Stable tortuosity of the thoracic
aorta. The pulmonary hila appear normal. Significant chronic
bronchitic interstitial changes but no infiltrates, edema or
effusions. Small hiatal hernia. The bony thorax is intact. Stable
degenerative changes involving the thoracic spine and thoracolumbar
scoliosis.
IMPRESSION: Chronic bronchitic type interstitial lung changes but no acute
overlying pulmonary process.

## 2017-07-19 ENCOUNTER — Other Ambulatory Visit: Payer: Self-pay | Admitting: Adult Health

## 2017-07-20 NOTE — Telephone Encounter (Signed)
Sent to the pharmacy by e-scribe. 

## 2017-09-22 ENCOUNTER — Other Ambulatory Visit: Payer: Self-pay | Admitting: Neurology

## 2017-09-27 ENCOUNTER — Telehealth: Payer: Self-pay | Admitting: Neurology

## 2017-09-27 ENCOUNTER — Ambulatory Visit (INDEPENDENT_AMBULATORY_CARE_PROVIDER_SITE_OTHER): Payer: Medicare Other | Admitting: Neurology

## 2017-09-27 ENCOUNTER — Encounter: Payer: Self-pay | Admitting: Neurology

## 2017-09-27 VITALS — BP 111/66 | HR 63 | Ht 68.0 in | Wt 181.5 lb

## 2017-09-27 DIAGNOSIS — G2581 Restless legs syndrome: Secondary | ICD-10-CM | POA: Diagnosis not present

## 2017-09-27 MED ORDER — HYDROCODONE-ACETAMINOPHEN 7.5-325 MG PO TABS: 1.0000 | ORAL_TABLET | Freq: Four times a day (QID) | ORAL | 0 refills | Status: DC | PRN

## 2017-09-27 NOTE — Progress Notes (Signed)
Reason for visit: Restless leg syndrome  Shelley Coleman is an 77 y.o. female  History of present illness:  Shelley Coleman is a 77 year old right-handed white female with a history of restless leg syndrome and history of neuropathy.  The patient has had cerebrovascular disease, she has difficulty with stiffness of the left leg and weakness of the left leg.  The patient in the past has gained good improvement from IV iron therapy but her insurance company will no longer cover this.  The patient is on Requip taking 1 mg in the afternoon and 3 mg in the evening, taking the 3 mg dosing leaves her with a hangover feeling the next morning and she does not consistently take this at night.  The patient has alprazolam for sleep as well, hydrocodone will sometimes be added and will also help the discomfort of the restless leg syndrome.  The patient has fallen on occasion, she recently fell and fractured 1 of her fingers on her right hand.  She was seen by Dr. Caralyn Guile for this.  The patient returns for an evaluation.  The patient does report a lot of fatigue, she is not extremely active during the day however.  Past Medical History:  Diagnosis Date  . Anemia    Has had iron infusions  . Breast cancer ([place])    right  . Depression   . Depression   . GERD (gastroesophageal reflux disease)   . Hiatal hernia   . History of GI bleed   . Hx of mitral valve prolapse   . Hyperlipidemia   . Irregular heart beat   . Neuropathy    numbness in both feet  . Nocturnal leg cramps 09/02/2015  . Optic neuritis    left  . Osteopenia   . Ovarian cyst   . Parotid tumor    right  . Pelvic fracture ([place])   . Restless legs syndrome   . Shingles 2000   Left in V1  . Stroke [facility])     Past Surgical History:  Procedure Laterality Date  . ABDOMINAL HYSTERECTOMY    . APPENDECTOMY    . BREAST LUMPECTOMY Left   . CATARACT EXTRACTION    . ESOPHAGEAL MANOMETRY N/A 01/13/2013   Procedure: ESOPHAGEAL MANOMETRY (EM);   Surgeon: Sable Feil, MD;  Location: [facility];  Service: Endoscopy;  Laterality: N/A;  . HERNIA REPAIR  08/2009  . MASTECTOMY Right   . SPINE SURGERY    . TOTAL HIP ARTHROPLASTY Left     Family History  Problem Relation Age of Onset  . Cancer Mother        Angio sarcoma   . Heart disease Father   . Hypertension Father   . Stroke Father        x2  . Heart attack Father        x 11  . Hernia Brother   . Hypertension Brother     Social history:  reports that she has never smoked. She has never used smokeless tobacco. She reports that she drinks about 0.6 oz of alcohol per week. She reports that she does not use drugs.    Allergies  Allergen Reactions  . Diflunisal     REACTION: rash , swelling  anaphlaxsis  . Lipitor [Atorvastatin]     Pt had body joint pain  . Tetanus Toxoid     REACTION: arm swelling, fever    Medications:  Prior to Admission medications   Medication Sig Start  Date End Date Taking? Authorizing Provider  ALPRAZolam Duanne Moron) 1 MG tablet TAKE 1 TABLET BY MOUTH AT BEDTIME AS NEEDED 06/19/17  Yes Nafziger, Tommi Rumps, NP  aspirin 325 MG tablet Take 1 tablet (325 mg total) by mouth daily. 11/02/15  Yes Reyne Dumas, MD  atenolol (TENORMIN) 50 MG tablet TAKE 1 TABLET BY MOUTH ONCE DAILY 03/08/17  Yes Nafziger, Tommi Rumps, NP  DEXILANT 60 MG capsule TAKE 1 CAPSULE BY MOUTH ONCE DAILY 07/20/17  Yes Nafziger, Tommi Rumps, NP  hydrochlorothiazide (HYDRODIURIL) 25 MG tablet Take 1 tablet (25 mg total) by mouth daily. Patient taking differently: Take 25 mg by mouth as needed.  04/28/16  Yes Nafziger, Tommi Rumps, NP  HYDROcodone-acetaminophen (NORCO) 7.5-325 MG tablet Take 1 tablet by mouth every 6 (six) hours as needed. 02/05/17  Yes Kathrynn Ducking, MD  MELATONIN PO Take 1 tablet by mouth at bedtime as needed (for sleep).    Yes [provider]  Multiple Vitamin (MULTIVITAMIN) tablet Take 1 tablet by mouth daily.    Yes [provider]  nitroGLYCERIN (NITROSTAT) 0.4  MG SL tablet PLACE ONE TABLET UNDER THE TONGUE EVERY 5 MINUTES AS NEEDED. 10/10/16  Yes Nafziger, Tommi Rumps, NP  pravastatin (PRAVACHOL) 10 MG tablet Take 1 tablet (10 mg total) by mouth daily. 02/08/17  Yes Nafziger, Tommi Rumps, NP  rOPINIRole (REQUIP) 1 MG tablet TAKE 1 TABLET BY MOUTH AT 5 PM AND 1 TAB AT BEDTIME 09/25/17  Yes Kathrynn Ducking, MD  rOPINIRole (REQUIP) 3 MG tablet TAKE ONE TABLET BY MOUTH ONCE DAILY AT BEDTIME 06/18/17  Yes Kathrynn Ducking, MD    ROS:  Out of a complete 14 system review of symptoms, the patient complains only of the following symptoms, and all other reviewed systems are negative.  Fatigue Hearing loss Double vision Palpitations of the heart Restless legs, insomnia, frequent waking Joint pain, muscle cramps Easy bruising Depression  Blood pressure 111/66, pulse 63, height 5\' 6"  (1.676 m), weight 181 lb 8 oz (82.3 kg).  Physical Exam  General: The patient is alert and cooperative at the time of the examination.  Skin: No significant peripheral edema is noted.   Neurologic Exam  Mental status: The patient is alert and oriented x 3 at the time of the examination. The patient has apparent normal recent and remote memory, with an apparently normal attention span and concentration ability.   Cranial nerves: Facial symmetry is present. Speech is normal, no aphasia or dysarthria is noted. Extraocular movements are full. Visual fields are full.  Motor: The patient has good strength in all 4 extremities, with exception of the patient has difficulty with hip flexion on the left.  Sensory examination: Soft touch sensation is symmetric on the face, arms, and legs.  Coordination: The patient has good finger-nose-finger bilaterally, she has difficult to perform heel-to-shin with the left leg, can perform on the right.  Gait and station: The patient has a normal gait. Tandem gait is normal. Romberg is negative. No drift is seen.  Reflexes: Deep tendon reflexes are  symmetric.   MRI brain 04/01/17:  IMPRESSION: This MRI of the brain without contrast shows the following: 1. Chronic lacunar infarction involving the head of the left caudate and adjacent internal capsule, unchanged in appearance compared to the 11/01/2015 MRI. 2. Chronic ischemic changes in the hemispheres, pons and thalamus, essentially unchanged when compared to the previous MRI.  3. There are no acute findings and there are no changes in the brain compared to the MRI dated 11/01/2015   *  MRI scan images were reviewed online. I agree with the written report.    Assessment/Plan:  1.  Restless leg syndrome  The patient continues to have ongoing symptoms.  I have suggested potentially substituting a Sinemet tablet for the evening dose of Requip to see if this works better.  She does not wish to do this at this time.  The Requip 3 mg dose may be taken an hour and a half earlier in the evening to see if she can avoid the hangover type feeling the next day.  The patient was given a prescription for the hydrocodone, she will follow-up in 6 months.  She reports that the episodes of vertigo have improved, she may have an occasional event sometimes associated with oscillopsia in the vertical plane.  Rolling over in bed at times may bring on the vertigo.  The vertigo does not last long.  Jill Alexanders MD 09/27/2017 2:24 PM  [facility] [address] [place], [place] [address]  Phone [phone] Fax [phone]

## 2017-09-27 NOTE — Telephone Encounter (Signed)
Called pt back. She will bring paperwork given to her in error back to office so we can shred. Her AVS was given to [place], Therapist, sports (on [place]) to bring to her.

## 2017-09-27 NOTE — Telephone Encounter (Signed)
Patient stated at check-out that her height is incorrect. She states she is 5'8 instead of 5'2. She has requested for this to be corrected.

## 2017-09-27 NOTE — Telephone Encounter (Signed)
Made correction in pt chart.

## 2017-09-27 NOTE — Telephone Encounter (Addendum)
Pt states she has been given the wrong discharge paperwork.  She has been given the paperwork for another pt.  Pt would like to know if her neighbor(RN Lovey Newcomer) could be given her paperwork and pt in turn she  would be willing to give the paperwork for the other pt back to RN [place] to bring in on tomorrow. Please call. RN Lovey Newcomer is on the [facility] for pt Shelley Coleman

## 2018-01-17 ENCOUNTER — Other Ambulatory Visit: Payer: Self-pay | Admitting: Adult Health

## 2018-01-21 NOTE — Telephone Encounter (Signed)
Last refill 06/19/17 and last office visit 02/05/17?

## 2018-02-06 DIAGNOSIS — R350 Frequency of micturition: Secondary | ICD-10-CM | POA: Diagnosis not present

## 2018-02-06 DIAGNOSIS — N390 Urinary tract infection, site not specified: Secondary | ICD-10-CM | POA: Diagnosis not present

## 2018-02-20 ENCOUNTER — Other Ambulatory Visit: Payer: Self-pay | Admitting: Adult Health

## 2018-02-20 DIAGNOSIS — Z853 Personal history of malignant neoplasm of breast: Secondary | ICD-10-CM | POA: Diagnosis not present

## 2018-02-20 DIAGNOSIS — Z23 Encounter for immunization: Secondary | ICD-10-CM | POA: Diagnosis not present

## 2018-02-20 DIAGNOSIS — N281 Cyst of kidney, acquired: Secondary | ICD-10-CM | POA: Diagnosis not present

## 2018-02-20 DIAGNOSIS — R3129 Other microscopic hematuria: Secondary | ICD-10-CM | POA: Diagnosis not present

## 2018-02-26 ENCOUNTER — Other Ambulatory Visit: Payer: Self-pay | Admitting: Physician Assistant

## 2018-02-26 DIAGNOSIS — R3129 Other microscopic hematuria: Secondary | ICD-10-CM

## 2018-03-04 ENCOUNTER — Ambulatory Visit
Admission: RE | Admit: 2018-03-04 | Discharge: 2018-03-04 | Disposition: A | Discharge status: Home / Self Care | Payer: Medicare Other | Source: Ambulatory Visit | Attending: Physician Assistant | Admitting: Physician Assistant

## 2018-03-04 DIAGNOSIS — R3129 Other microscopic hematuria: Secondary | ICD-10-CM

## 2018-03-13 ENCOUNTER — Other Ambulatory Visit: Payer: Self-pay | Admitting: Adult Health

## 2018-03-13 ENCOUNTER — Encounter: Payer: Self-pay | Admitting: Adult Health

## 2018-03-13 ENCOUNTER — Ambulatory Visit (INDEPENDENT_AMBULATORY_CARE_PROVIDER_SITE_OTHER): Payer: Medicare Other | Admitting: Adult Health

## 2018-03-13 VITALS — BP 124/84 | Temp 98.1°F | Wt 183.0 lb

## 2018-03-13 DIAGNOSIS — N281 Cyst of kidney, acquired: Secondary | ICD-10-CM | POA: Diagnosis not present

## 2018-03-13 DIAGNOSIS — R319 Hematuria, unspecified: Secondary | ICD-10-CM

## 2018-03-13 DIAGNOSIS — N3001 Acute cystitis with hematuria: Secondary | ICD-10-CM

## 2018-03-13 LAB — BASIC METABOLIC PANEL
BUN: 18 mg/dL (ref 6–23)
CHLORIDE: 106 meq/L (ref 96–112)
CO2: 30 mEq/L (ref 19–32)
Calcium: 8.8 mg/dL (ref 8.4–10.5)
Creatinine, Ser: 0.7 mg/dL (ref 0.40–1.20)
GFR: 86.54 mL/min (ref >60.00)
GLUCOSE: 99 mg/dL (ref 70–99)
POTASSIUM: 4.2 meq/L (ref 3.5–5.1)
Sodium: 140 mEq/L (ref 135–145)

## 2018-03-13 LAB — URINALYSIS, ROUTINE W REFLEX MICROSCOPIC
BILIRUBIN URINE: NEGATIVE
LEUKOCYTES UA: NEGATIVE
Nitrite: POSITIVE — AB
Specific Gravity, Urine: 1.025 (ref 1.000–1.030)
URINE GLUCOSE: NEGATIVE
Urobilinogen, UA: 1 (ref 0.0–1.0)
pH: 5.5 (ref 5.0–8.0)

## 2018-03-13 NOTE — Progress Notes (Signed)
Subjective:    Patient ID: Shelley Coleman, female    DOB: [birth year], 77 y.o.   MRN: [id number]  HPI  77 year old female who  has a past medical history of Anemia, Breast cancer ([place]), Depression, Depression, GERD (gastroesophageal reflux disease), Hiatal hernia, History of GI bleed, mitral valve prolapse, Hyperlipidemia, Irregular heart beat, Neuropathy, Nocturnal leg cramps (09/02/2015), Optic neuritis, Osteopenia, Ovarian cyst, Parotid tumor, Pelvic fracture ([place]), Restless legs syndrome, Shingles (2000), and Stroke ([place]).  She presents to the office today for follow up after recent urgent care visit at [facility]. She presented with the complaint of UTI. She was treated for a UTI and her symptoms improved over the last two weeks. She returned and a repeat UA was done, she was found to have microscopic hematuria and Romelle Starcher did a CT scan due to history of cancer.   Her CT showed    IMPRESSION: No evidence of urolithiasis or hydronephrosis.  Small hiatal hernia.  3 cm indeterminate lesion in upper pole of left kidney, which could represent a complex cyst or solid mass. Recommend abdomen MRI without and with contrast for further characterization.   Review of Systems  Constitutional: Negative.   Respiratory: Negative.   Cardiovascular: Negative.   Gastrointestinal: Negative.   Genitourinary: Negative.   Neurological: Negative.   Hematological: Negative.    Past Medical History:  Diagnosis Date  . Anemia    Has had iron infusions  . Breast cancer ([place])    right  . Depression   . Depression   . GERD (gastroesophageal reflux disease)   . Hiatal hernia   . History of GI bleed   . Hx of mitral valve prolapse   . Hyperlipidemia   . Irregular heart beat   . Neuropathy    numbness in both feet  . Nocturnal leg cramps 09/02/2015  . Optic neuritis    left  . Osteopenia   . Ovarian cyst   . Parotid tumor    right  . Pelvic fracture ([place])   . Restless legs  syndrome   . Shingles 2000   Left in V1  . Stroke [facility])     Social History   Socioeconomic History  . Marital status: Married    Spouse name: Not on file  . Number of children: 2  . Years of education: 85  . Highest education level: Not on file  Occupational History  . Occupation: Retired Animal nutritionist  . Financial resource strain: Not on file  . Food insecurity:    Worry: Not on file    Inability: Not on file  . Transportation needs:    Medical: Not on file    Non-medical: Not on file  Tobacco Use  . Smoking status: Never Smoker  . Smokeless tobacco: Never Used  Substance and Sexual Activity  . Alcohol use: Yes    Alcohol/week: 1.0 standard drinks    Types: 1 Glasses of wine per week    Comment: Rarely,socially  . Drug use: No  . Sexual activity: Not on file  Lifestyle  . Physical activity:    Days per week: Not on file    Minutes per session: Not on file  . Stress: Not on file  Relationships  . Social connections:    Talks on phone: Not on file    Gets together: Not on file    Attends religious service: Not on file    Active member of club or organization: Not  on file    Attends meetings of clubs or organizations: Not on file    Relationship status: Not on file  . Intimate partner violence:    Fear of current or ex partner: Not on file    Emotionally abused: Not on file    Physically abused: Not on file    Forced sexual activity: Not on file  Other Topics Concern  . Not on file  Social History Narrative   Retired from nursing    Widowed    Left-handed       Past Surgical History:  Procedure Laterality Date  . ABDOMINAL HYSTERECTOMY    . APPENDECTOMY    . BREAST LUMPECTOMY Left   . CATARACT EXTRACTION    . ESOPHAGEAL MANOMETRY N/A 01/13/2013   Procedure: ESOPHAGEAL MANOMETRY (EM);  Surgeon: Sable Feil, MD;  Location: [facility];  Service: Endoscopy;  Laterality: N/A;  . HERNIA REPAIR  08/2009  . MASTECTOMY Right   . SPINE SURGERY      . TOTAL HIP ARTHROPLASTY Left     Family History  Problem Relation Age of Onset  . Cancer Mother        Angio sarcoma   . Heart disease Father   . Hypertension Father   . Stroke Father        x2  . Heart attack Father        x 11  . Hernia Brother   . Hypertension Brother     Allergies  Allergen Reactions  . Diflunisal     REACTION: rash , swelling  anaphlaxsis  . Lipitor [Atorvastatin]     Pt had body joint pain  . Tetanus Toxoid     REACTION: arm swelling, fever    Current Outpatient Medications on File Prior to Visit  Medication Sig Dispense Refill  . ALPRAZolam (XANAX) 1 MG tablet TAKE 1 TABLET BY MOUTH AT BEDTIME AS NEEDED 30 tablet 0  . aspirin 325 MG tablet Take 1 tablet (325 mg total) by mouth daily. 30 tablet 0  . atenolol (TENORMIN) 50 MG tablet TAKE 1 TABLET BY MOUTH ONCE DAILY 90 tablet 3  . DEXILANT 60 MG capsule TAKE 1 CAPSULE BY MOUTH ONCE DAILY 90 capsule 0  . hydrochlorothiazide (HYDRODIURIL) 25 MG tablet Take 1 tablet (25 mg total) by mouth daily. (Patient taking differently: Take 25 mg by mouth as needed. ) 30 tablet 1  . HYDROcodone-acetaminophen (NORCO) 7.5-325 MG tablet Take 1 tablet by mouth every 6 (six) hours as needed. 60 tablet 0  . MELATONIN PO Take 1 tablet by mouth at bedtime as needed (for sleep).     . Multiple Vitamin (MULTIVITAMIN) tablet Take 1 tablet by mouth daily.     . nitroGLYCERIN (NITROSTAT) 0.4 MG SL tablet PLACE ONE TABLET UNDER THE TONGUE EVERY 5 MINUTES AS NEEDED. 100 tablet 0  . pravastatin (PRAVACHOL) 10 MG tablet Take 1 tablet (10 mg total) by mouth daily. 90 tablet 1  . rOPINIRole (REQUIP) 1 MG tablet TAKE 1 TABLET BY MOUTH AT 5 PM AND 1 TAB AT BEDTIME 180 tablet 1  . rOPINIRole (REQUIP) 3 MG tablet TAKE ONE TABLET BY MOUTH ONCE DAILY AT BEDTIME 90 tablet 3   No current facility-administered medications on file prior to visit.     BP 124/84   Temp 98.1 F (36.7 C)   Wt 183 lb (83 kg)   BMI 27.83 kg/m        Objective:   Physical Exam  Constitutional:  She is oriented to person, place, and time. She appears well-developed and well-nourished. No distress.  Cardiovascular: Normal rate, regular rhythm, normal heart sounds and intact distal pulses. Exam reveals no gallop and no friction rub.  No murmur heard. Pulmonary/Chest: Effort normal and breath sounds normal. No stridor. No respiratory distress. She has no wheezes. She has no rales. She exhibits no tenderness.  Abdominal: Soft. Bowel sounds are normal.  Musculoskeletal: Normal range of motion.  Neurological: She is alert and oriented to person, place, and time. No cranial nerve deficit. Coordination normal.  Skin: Skin is warm and dry. No rash noted. She is not diaphoretic. No erythema. No pallor.  Psychiatric: She has a normal mood and affect. Her behavior is normal. Judgment and thought content normal.  Nursing note and vitals reviewed.       Assessment & Plan:  1. Acquired cyst of kidney - Basic Metabolic Panel - Urinalysis with Reflex Microscopic - MR Abdomen W Wo Contrast; Future  2. Hematuria, unspecified type - Urinalysis with Reflex Microscopic   Dorothyann Peng, NP

## 2018-03-14 ENCOUNTER — Other Ambulatory Visit: Payer: Self-pay | Admitting: Adult Health

## 2018-03-14 DIAGNOSIS — N3001 Acute cystitis with hematuria: Secondary | ICD-10-CM | POA: Diagnosis not present

## 2018-03-14 MED ORDER — AMOXICILLIN-POT CLAVULANATE 875-125 MG PO TABS: 1.0000 | ORAL_TABLET | Freq: Two times a day (BID) | ORAL | 0 refills | Status: DC

## 2018-03-14 MED ORDER — CIPROFLOXACIN HCL 500 MG PO TABS: 500.0000 mg | ORAL_TABLET | Freq: Two times a day (BID) | ORAL | 0 refills | Status: AC

## 2018-03-14 NOTE — Addendum Note (Signed)
Addended by: Elmer Picker on: 03/14/2018 01:04 PM   Modules accepted: Orders

## 2018-03-16 LAB — URINE CULTURE
MICRO NUMBER:: 91372825
SPECIMEN QUALITY: ADEQUATE

## 2018-03-21 ENCOUNTER — Other Ambulatory Visit: Payer: Self-pay

## 2018-03-24 ENCOUNTER — Ambulatory Visit
Admission: RE | Admit: 2018-03-24 | Discharge: 2018-03-24 | Disposition: A | Discharge status: Home / Self Care | Payer: Medicare Other | Source: Ambulatory Visit | Attending: Adult Health | Admitting: Adult Health

## 2018-03-24 DIAGNOSIS — N281 Cyst of kidney, acquired: Secondary | ICD-10-CM

## 2018-03-24 MED ORDER — GADOBENATE DIMEGLUMINE 529 MG/ML IV SOLN
15.0000 mL | Freq: Once | INTRAVENOUS | Status: AC | PRN
  Administered 2018-03-24: 15 mL via INTRAVENOUS

## 2018-03-27 ENCOUNTER — Encounter: Payer: Self-pay | Admitting: Adult Health

## 2018-03-27 ENCOUNTER — Ambulatory Visit (INDEPENDENT_AMBULATORY_CARE_PROVIDER_SITE_OTHER): Payer: Medicare Other | Admitting: Adult Health

## 2018-03-27 VITALS — BP 128/80 | Temp 97.9°F | Ht 67.0 in | Wt 180.0 lb

## 2018-03-27 DIAGNOSIS — I10 Essential (primary) hypertension: Secondary | ICD-10-CM | POA: Diagnosis not present

## 2018-03-27 DIAGNOSIS — G2581 Restless legs syndrome: Secondary | ICD-10-CM

## 2018-03-27 DIAGNOSIS — R350 Frequency of micturition: Secondary | ICD-10-CM

## 2018-03-27 DIAGNOSIS — K2100 Gastro-esophageal reflux disease with esophagitis, without bleeding: Secondary | ICD-10-CM

## 2018-03-27 DIAGNOSIS — E782 Mixed hyperlipidemia: Secondary | ICD-10-CM | POA: Diagnosis not present

## 2018-03-27 LAB — LIPID PANEL
CHOL/HDL RATIO: 4
CHOLESTEROL: 268 mg/dL — AB (ref 0–200)
HDL: 62.4 mg/dL (ref >39.00)
LDL CALC: 181 mg/dL — AB (ref 0–99)
NonHDL: 205.62
TRIGLYCERIDES: 124 mg/dL (ref 0.0–149.0)
VLDL: 24.8 mg/dL (ref 0.0–40.0)

## 2018-03-27 LAB — BASIC METABOLIC PANEL
BUN: 20 mg/dL (ref 6–23)
CHLORIDE: 104 meq/L (ref 96–112)
CO2: 28 meq/L (ref 19–32)
Calcium: 9.6 mg/dL (ref 8.4–10.5)
Creatinine, Ser: 0.74 mg/dL (ref 0.40–1.20)
GFR: 81.16 mL/min (ref >60.00)
GLUCOSE: 95 mg/dL (ref 70–99)
POTASSIUM: 4.6 meq/L (ref 3.5–5.1)
SODIUM: 139 meq/L (ref 135–145)

## 2018-03-27 LAB — POCT URINALYSIS DIPSTICK
Blood, UA: NEGATIVE
Glucose, UA: NEGATIVE
NITRITE UA: NEGATIVE
PROTEIN UA: POSITIVE — AB
Urobilinogen, UA: 0.2 E.U./dL
pH, UA: 5 (ref 5.0–8.0)

## 2018-03-27 LAB — CBC WITH DIFFERENTIAL/PLATELET
BASOS PCT: 1.2 % (ref 0.0–3.0)
Basophils Absolute: 0.1 10*3/uL (ref 0.0–0.1)
EOS PCT: 0.9 % (ref 0.0–5.0)
Eosinophils Absolute: 0.1 10*3/uL (ref 0.0–0.7)
HCT: 41.2 % (ref 36.0–46.0)
Hemoglobin: 13.6 g/dL (ref 12.0–15.0)
LYMPHS ABS: 2.3 10*3/uL (ref 0.7–4.0)
Lymphocytes Relative: 33.5 % (ref 12.0–46.0)
MCHC: 33 g/dL (ref 30.0–36.0)
MCV: 88.1 fl (ref 78.0–100.0)
MONO ABS: 0.6 10*3/uL (ref 0.1–1.0)
MONOS PCT: 8.6 % (ref 3.0–12.0)
NEUTROS ABS: 3.9 10*3/uL (ref 1.4–7.7)
NEUTROS PCT: 55.8 % (ref 43.0–77.0)
Platelets: 255 10*3/uL (ref 150.0–400.0)
RBC: 4.68 Mil/uL (ref 3.87–5.11)
RDW: 14.3 % (ref 11.5–15.5)
WBC: 7 10*3/uL (ref 4.0–10.5)

## 2018-03-27 LAB — HEPATIC FUNCTION PANEL
ALBUMIN: 4.2 g/dL (ref 3.5–5.2)
ALT: 13 U/L (ref 0–35)
AST: 14 U/L (ref 0–37)
Alkaline Phosphatase: 85 U/L (ref 39–117)
BILIRUBIN TOTAL: 0.5 mg/dL (ref 0.2–1.2)
Bilirubin, Direct: 0.1 mg/dL (ref 0.0–0.3)
Total Protein: 7 g/dL (ref 6.0–8.3)

## 2018-03-27 LAB — TSH: TSH: 2.39 u[IU]/mL (ref 0.35–4.50)

## 2018-03-27 NOTE — Patient Instructions (Signed)
It was great seeing you today   We will follow up with you regarding your blood work   Please follow up in one year or sooner if needed  Continue to eat healthy and exercise as much as possible

## 2018-03-27 NOTE — Progress Notes (Signed)
Subjective:    Patient ID: Shelley Coleman, female    DOB: 1940-11-16, 77 y.o.   MRN: [id number]  HPI Patient presents for yearly preventative medicine examination. She is a pleasant 77 year old female who  has a past medical history of Anemia, Breast cancer ([place]), Depression, Depression, GERD (gastroesophageal reflux disease), Hiatal hernia, History of GI bleed, mitral valve prolapse, Hyperlipidemia, Irregular heart beat, Neuropathy, Nocturnal leg cramps (09/02/2015), Optic neuritis, Osteopenia, Ovarian cyst, Parotid tumor, Pelvic fracture ([facility]), Restless legs syndrome, Shingles (2000), and Stroke ([place]).  Essential Hypertension - Controlled well with Atenolol 50 mg and HCTZ 25 mg ( PRN).  She denies  headaches, blurred vision, lightheadedness, or syncopal episodes BP Readings from Last 3 Encounters:  03/27/18 128/80  03/13/18 124/84  09/27/17 111/66   Restless Leg Syndrome -prescribed Requip by Dr. Jannifer Franklin with [facility] and feels as though symptoms are well controlled  Insomnia - Takes Xanax 0.5-1 mg PRN.  Last 30-day supply was approximately 3 months ago  GERD - controlled with Dexilant   UTI - was recently treated for UTI with Augmentin. Reports that she is feeling much improved but continues to have some mild frequency.   Hyperlipidemia - statin intolerant  Lab Results  Component Value Date   CHOL 273 (H) 02/05/2017   HDL 59.00 02/05/2017   LDLCALC 183 (H) 02/05/2017   LDLDIRECT 168.5 02/22/2012   TRIG 153.0 (H) 02/05/2017   CHOLHDL 5 02/05/2017     All immunizations and health maintenance protocols were reviewed with the patient and needed orders were placed. utd  Appropriate screening laboratory values were ordered for the patient including screening of hyperlipidemia, renal function and hepatic function.  Medication reconciliation,  past medical history, social history, problem list and allergies were reviewed in detail with the patient  Goals were  established with regard to weight loss, exercise, and  diet in compliance with medications. She does not exercise but stays active. Eats healthy low salt diet.  Wt Readings from Last 3 Encounters:  03/27/18 180 lb (81.6 kg)  03/13/18 183 lb (83 kg)  09/27/17 181 lb 8 oz (82.3 kg)   End of life planning was discussed.  She is up-to-date on routine colonoscopy.  She is due for DEXA scan - refuses. Will have her mammogram coming up.    Review of Systems  Constitutional: Negative.   HENT: Negative.   Eyes: Negative.   Respiratory: Negative.   Cardiovascular: Negative.   Gastrointestinal: Negative.   Endocrine: Negative.   Genitourinary: Negative.   Musculoskeletal: Positive for arthralgias.  Skin: Negative.   Allergic/Immunologic: Negative.   Neurological: Positive for numbness (bilateral lower extremities ).  Hematological: Negative.   Psychiatric/Behavioral: Positive for sleep disturbance.   Past Medical History:  Diagnosis Date  . Anemia    Has had iron infusions  . Breast cancer ([place])    right  . Depression   . Depression   . GERD (gastroesophageal reflux disease)   . Hiatal hernia   . History of GI bleed   . Hx of mitral valve prolapse   . Hyperlipidemia   . Irregular heart beat   . Neuropathy    numbness in both feet  . Nocturnal leg cramps 09/02/2015  . Optic neuritis    left  . Osteopenia   . Ovarian cyst   . Parotid tumor    right  . Pelvic fracture ([place])   . Restless legs syndrome   . Shingles 2000   Left  in V1  . Stroke [facility])     Social History   Socioeconomic History  . Marital status: Married    Spouse name: Not on file  . Number of children: 2  . Years of education: 39  . Highest education level: Not on file  Occupational History  . Occupation: Retired Animal nutritionist  . Financial resource strain: Not on file  . Food insecurity:    Worry: Not on file    Inability: Not on file  . Transportation needs:    Medical: Not on file     Non-medical: Not on file  Tobacco Use  . Smoking status: Never Smoker  . Smokeless tobacco: Never Used  Substance and Sexual Activity  . Alcohol use: Yes    Alcohol/week: 1.0 standard drinks    Types: 1 Glasses of wine per week    Comment: Rarely,socially  . Drug use: No  . Sexual activity: Not on file  Lifestyle  . Physical activity:    Days per week: Not on file    Minutes per session: Not on file  . Stress: Not on file  Relationships  . Social connections:    Talks on phone: Not on file    Gets together: Not on file    Attends religious service: Not on file    Active member of club or organization: Not on file    Attends meetings of clubs or organizations: Not on file    Relationship status: Not on file  . Intimate partner violence:    Fear of current or ex partner: Not on file    Emotionally abused: Not on file    Physically abused: Not on file    Forced sexual activity: Not on file  Other Topics Concern  . Not on file  Social History Narrative   Retired from nursing    Widowed    Left-handed       Past Surgical History:  Procedure Laterality Date  . ABDOMINAL HYSTERECTOMY    . APPENDECTOMY    . BREAST LUMPECTOMY Left   . CATARACT EXTRACTION    . ESOPHAGEAL MANOMETRY N/A 01/13/2013   Procedure: ESOPHAGEAL MANOMETRY (EM);  Surgeon: Sable Feil, MD;  Location: [facility];  Service: Endoscopy;  Laterality: N/A;  . HERNIA REPAIR  08/2009  . MASTECTOMY Right   . SPINE SURGERY    . TOTAL HIP ARTHROPLASTY Left     Family History  Problem Relation Age of Onset  . Cancer Mother        Angio sarcoma   . Heart disease Father   . Hypertension Father   . Stroke Father        x2  . Heart attack Father        x 11  . Hernia Brother   . Hypertension Brother     Allergies  Allergen Reactions  . Diflunisal     REACTION: rash , swelling  anaphlaxsis  . Lipitor [Atorvastatin]     Pt had body joint pain  . Statins     Myalgia    . Tetanus Toxoid      REACTION: arm swelling, fever    Current Outpatient Medications on File Prior to Visit  Medication Sig Dispense Refill  . ALPRAZolam (XANAX) 1 MG tablet TAKE 1 TABLET BY MOUTH AT BEDTIME AS NEEDED 30 tablet 0  . aspirin 325 MG tablet Take 1 tablet (325 mg total) by mouth daily. 30 tablet 0  . atenolol (TENORMIN) 50  MG tablet TAKE 1 TABLET BY MOUTH ONCE DAILY 90 tablet 3  . DEXILANT 60 MG capsule TAKE 1 CAPSULE BY MOUTH ONCE DAILY 90 capsule 0  . hydrochlorothiazide (HYDRODIURIL) 25 MG tablet Take 1 tablet (25 mg total) by mouth daily. (Patient taking differently: Take 25 mg by mouth as needed. ) 30 tablet 1  . HYDROcodone-acetaminophen (NORCO) 7.5-325 MG tablet Take 1 tablet by mouth every 6 (six) hours as needed. 60 tablet 0  . MELATONIN PO Take 1 tablet by mouth at bedtime as needed (for sleep).     . Multiple Vitamin (MULTIVITAMIN) tablet Take 1 tablet by mouth daily.     . nitroGLYCERIN (NITROSTAT) 0.4 MG SL tablet PLACE ONE TABLET UNDER THE TONGUE EVERY 5 MINUTES AS NEEDED. 100 tablet 0  . rOPINIRole (REQUIP) 1 MG tablet TAKE 1 TABLET BY MOUTH AT 5 PM AND 1 TAB AT BEDTIME 180 tablet 1  . rOPINIRole (REQUIP) 3 MG tablet TAKE ONE TABLET BY MOUTH ONCE DAILY AT BEDTIME 90 tablet 3   No current facility-administered medications on file prior to visit.     BP 128/80   Temp 97.9 F (36.6 C)   Ht 5\' 7"  (1.702 m)   Wt 180 lb (81.6 kg)   BMI 28.19 kg/m       Objective:   Physical Exam  Constitutional: She is oriented to person, place, and time. She appears well-developed and well-nourished. No distress.  HENT:  Head: Normocephalic and atraumatic.  Right Ear: External ear normal.  Left Ear: External ear normal.  Nose: Nose normal.  Mouth/Throat: Oropharynx is clear and moist. No oropharyngeal exudate.  Eyes: Pupils are equal, round, and reactive to light. Conjunctivae and EOM are normal. Right eye exhibits no discharge. Left eye exhibits no discharge. No scleral icterus.  Neck:  Normal range of motion. Neck supple. No JVD present. No tracheal deviation present. No thyromegaly present.  Cardiovascular: Normal rate, regular rhythm, normal heart sounds and intact distal pulses. Exam reveals no gallop and no friction rub.  No murmur heard. Pulmonary/Chest: Effort normal and breath sounds normal. No stridor. No respiratory distress. She has no wheezes. She has no rales. She exhibits no tenderness.  Abdominal: Soft. Normal appearance and bowel sounds are normal. She exhibits no distension and no mass. There is tenderness in the suprapubic area. There is no rigidity, no rebound, no guarding and no CVA tenderness. No hernia.  Musculoskeletal: Normal range of motion. She exhibits no edema, tenderness or deformity.  Lymphadenopathy:    She has no cervical adenopathy.  Neurological: She is alert and oriented to person, place, and time. She displays normal reflexes. No cranial nerve deficit or sensory deficit. She exhibits normal muscle tone. Coordination normal.  Skin: Skin is warm and dry. Capillary refill takes less than 2 seconds. No rash noted. She is not diaphoretic. No erythema. No pallor.  Psychiatric: She has a normal mood and affect. Her behavior is normal. Judgment and thought content normal.  Nursing note and vitals reviewed.     Assessment & Plan:  1. Mixed hyperlipidemia - Statin intolerant  - Advised heart healthy diet and exercise  - Basic metabolic panel - CBC with Differential/Platelet - Hepatic function panel - Lipid panel - TSH - POC Urinalysis Dipstick  2. Restless legs syndrome (RLS) - continue with Requip  - Follow up with neurology as directed   3. Gastroesophageal reflux disease with esophagitis - Continue with Dexilant   4. Essential hypertension - Well controlled. No change  in medication  - Basic metabolic panel - CBC with Differential/Platelet - Hepatic function panel - Lipid panel - TSH - POC Urinalysis Dipstick  5. Urinary  frequency  - POC Urinalysis Dipstick  Dorothyann Peng, NP

## 2018-04-09 ENCOUNTER — Encounter: Payer: Self-pay | Admitting: Neurology

## 2018-04-09 ENCOUNTER — Ambulatory Visit (INDEPENDENT_AMBULATORY_CARE_PROVIDER_SITE_OTHER): Payer: Medicare Other | Admitting: Neurology

## 2018-04-09 VITALS — BP 128/75 | HR 68 | Ht 67.0 in | Wt 182.0 lb

## 2018-04-09 DIAGNOSIS — G2581 Restless legs syndrome: Secondary | ICD-10-CM | POA: Diagnosis not present

## 2018-04-09 MED ORDER — LEVETIRACETAM 500 MG PO TABS: 500.0000 mg | ORAL_TABLET | Freq: Every day | ORAL | 3 refills | Status: DC

## 2018-04-09 MED ORDER — ROPINIROLE HCL 1 MG PO TABS: ORAL_TABLET | ORAL | 3 refills | Status: DC

## 2018-04-09 NOTE — Patient Instructions (Signed)
We will try Keppra 500 mg at night for the neuropathy pain.

## 2018-04-09 NOTE — Progress Notes (Signed)
Reason for visit: Restless leg syndrome, chronic low back pain, peripheral neuropathy  Shelley Coleman is an 77 y.o. female  History of present illness:  Shelley Coleman is a 77 year old right-handed white female with a history of lumbosacral spine surgery and chronic low back pain.  The patient has a peripheral neuropathy as well and she has significant burning sensation in the feet that has gotten worse over time.  The patient is not comfortable at night, she has difficulty sleeping in part related to the neuropathy.  Light touch on either 1 of her legs will be painful.  The patient does have the restless legs, she takes Requip during the day and in the evening.  She in the past has been on Lyrica, gabapentin, and Cymbalta but did not do well with these medications.  The patient recently has had a recurring urinary tract infection that is now being treated with antibiotics.  The patient does have some gait instability, she denies any recent falls but she did fall and fractured her right wrist in the summer 2019.  The patient does not use a cane or a walker for ambulation.  The patient returns to this office for an evaluation.  Past Medical History:  Diagnosis Date  . Anemia    Has had iron infusions  . Breast cancer ([place])    right  . Depression   . Depression   . GERD (gastroesophageal reflux disease)   . Hiatal hernia   . History of GI bleed   . Hx of mitral valve prolapse   . Hyperlipidemia   . Irregular heart beat   . Neuropathy    numbness in both feet  . Nocturnal leg cramps 09/02/2015  . Optic neuritis    left  . Osteopenia   . Ovarian cyst   . Parotid tumor    right  . Pelvic fracture ([place])   . Restless legs syndrome   . Shingles 2000   Left in V1  . Stroke [facility])     Past Surgical History:  Procedure Laterality Date  . ABDOMINAL HYSTERECTOMY    . APPENDECTOMY    . BREAST LUMPECTOMY Left   . CATARACT EXTRACTION    . ESOPHAGEAL MANOMETRY N/A 01/13/2013   Procedure:  ESOPHAGEAL MANOMETRY (EM);  Surgeon: Sable Feil, MD;  [facility];  Service: Endoscopy;  Laterality: N/A;  . HERNIA REPAIR  08/2009  . MASTECTOMY Right   . SPINE SURGERY    . TOTAL HIP ARTHROPLASTY Left     Family History  Problem Relation Age of Onset  . Cancer Mother        Angio sarcoma   . Heart disease Father   . Hypertension Father   . Stroke Father        x2  . Heart attack Father        x 11  . Hernia Brother   . Hypertension Brother     Social history:  reports that she has never smoked. She has never used smokeless tobacco. She reports that she drinks about 1.0 standard drinks of alcohol per week. She reports that she does not use drugs.    Allergies  Allergen Reactions  . Diflunisal     REACTION: rash , swelling  anaphlaxsis  . Lipitor [Atorvastatin]     Pt had body joint pain  . Statins     Myalgia    . Tetanus Toxoid     REACTION: arm swelling, fever  Medications:  Prior to Admission medications   Medication Sig Start Date End Date Taking? Authorizing Provider  ALPRAZolam Duanne Moron) 1 MG tablet TAKE 1 TABLET BY MOUTH AT BEDTIME AS NEEDED 01/21/18  Yes Nafziger, Tommi Rumps, NP  aspirin 325 MG tablet Take 1 tablet (325 mg total) by mouth daily. 11/02/15  Yes Reyne Dumas, MD  atenolol (TENORMIN) 50 MG tablet TAKE 1 TABLET BY MOUTH ONCE DAILY 03/08/17  Yes Nafziger, Tommi Rumps, NP  DEXILANT 60 MG capsule TAKE 1 CAPSULE BY MOUTH ONCE DAILY 02/21/18  Yes Nafziger, Tommi Rumps, NP  hydrochlorothiazide (HYDRODIURIL) 25 MG tablet Take 1 tablet (25 mg total) by mouth daily. Patient taking differently: Take 25 mg by mouth as needed.  04/28/16  Yes Nafziger, Tommi Rumps, NP  HYDROcodone-acetaminophen (NORCO) 7.5-325 MG tablet Take 1 tablet by mouth every 6 (six) hours as needed. 09/27/17  Yes Kathrynn Ducking, MD  MELATONIN PO Take 1 tablet by mouth at bedtime as needed (for sleep).    Yes [provider]  Multiple Vitamin (MULTIVITAMIN) tablet Take 1 tablet by mouth  daily.    Yes [provider]  nitroGLYCERIN (NITROSTAT) 0.4 MG SL tablet PLACE ONE TABLET UNDER THE TONGUE EVERY 5 MINUTES AS NEEDED. 10/10/16  Yes Nafziger, Tommi Rumps, NP  rOPINIRole (REQUIP) 1 MG tablet TAKE 1 TABLET BY MOUTH AT 5 PM AND 1 TAB AT BEDTIME 09/25/17  Yes Kathrynn Ducking, MD  rOPINIRole (REQUIP) 3 MG tablet TAKE ONE TABLET BY MOUTH ONCE DAILY AT BEDTIME 06/18/17  Yes Kathrynn Ducking, MD    ROS:  Out of a complete 14 system review of symptoms, the patient complains only of the following symptoms, and all other reviewed systems are negative.  Fatigue Hearing loss Double vision Restless legs, insomnia, frequent waking Frequency of urination, blood in the urine Muscle cramps, walking difficulty Bruising easily Numbness Depression  Blood pressure 128/75, pulse 68, height 5\' 7"  (1.702 m), weight 182 lb (82.6 kg).  Physical Exam  General: The patient is alert and cooperative at the time of the examination.  Skin: No significant peripheral edema is noted.   Neurologic Exam  Mental status: The patient is alert and oriented x 3 at the time of the examination. The patient has apparent normal recent and remote memory, with an apparently normal attention span and concentration ability.   Cranial nerves: Facial symmetry is present. Speech is normal, no aphasia or dysarthria is noted. Extraocular movements are full. Visual fields are full.  Motor: The patient has good strength in all 4 extremities.  Sensory examination: Soft touch sensation is symmetric on the face, arms, and legs.  Coordination: The patient has good finger-nose-finger and heel-to-shin bilaterally.  Gait and station: The patient has a slightly wide-based gait. Tandem gait is normal. Romberg is negative. No drift is seen.  Reflexes: Deep tendon reflexes are symmetric, but are depressed with exception that the left biceps reflex is more brisk than that on the right.   Assessment/Plan:  1.   Restless leg syndrome  2. Peripheral neuropathy  3.  Chronic low back pain  The patient will be continued on the Requip, the 1 mg tablets were sent into the pharmacy.  The patient will be placed on Keppra for her neuropathy pain starting at 500 mg in the evening.  The patient will follow-up in 6 months, she will call for any dose adjustments of the Keppra.  Jill Alexanders MD 04/09/2018 12:31 PM  [facility] [address] [place], [place]  [address]  Phone [phone] Fax [phone]

## 2018-04-11 DIAGNOSIS — N3020 Other chronic cystitis without hematuria: Secondary | ICD-10-CM | POA: Diagnosis not present

## 2018-04-11 DIAGNOSIS — N281 Cyst of kidney, acquired: Secondary | ICD-10-CM | POA: Diagnosis not present

## 2018-04-16 DIAGNOSIS — N3020 Other chronic cystitis without hematuria: Secondary | ICD-10-CM | POA: Diagnosis not present

## 2018-05-27 ENCOUNTER — Other Ambulatory Visit: Payer: Self-pay | Admitting: Adult Health

## 2018-05-28 NOTE — Telephone Encounter (Signed)
Sent to the pharmacy by e-scribe. 

## 2018-06-22 ENCOUNTER — Other Ambulatory Visit: Payer: Self-pay | Admitting: Adult Health

## 2018-06-25 NOTE — Telephone Encounter (Signed)
Sent to the pharmacy by e-scribe. 

## 2018-07-26 IMAGING — CR DG WRIST COMPLETE 3+V*R*
4 series · 4 of 4 positions shown · non-contrast
Comparison: None.

CLINICAL DATA: Pain following fall

EXAM:
RIGHT WRIST - COMPLETE 3+ VIEW

[x wrist pa right]
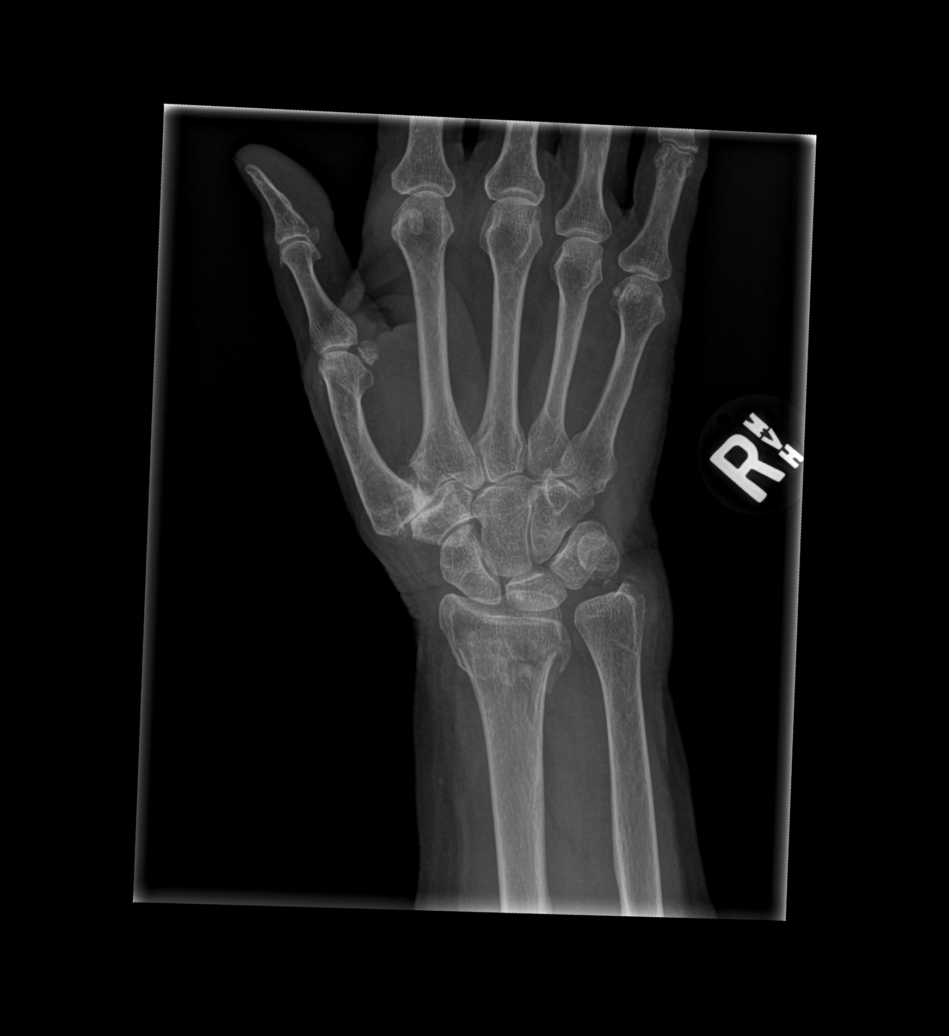

[x wrist obl right]
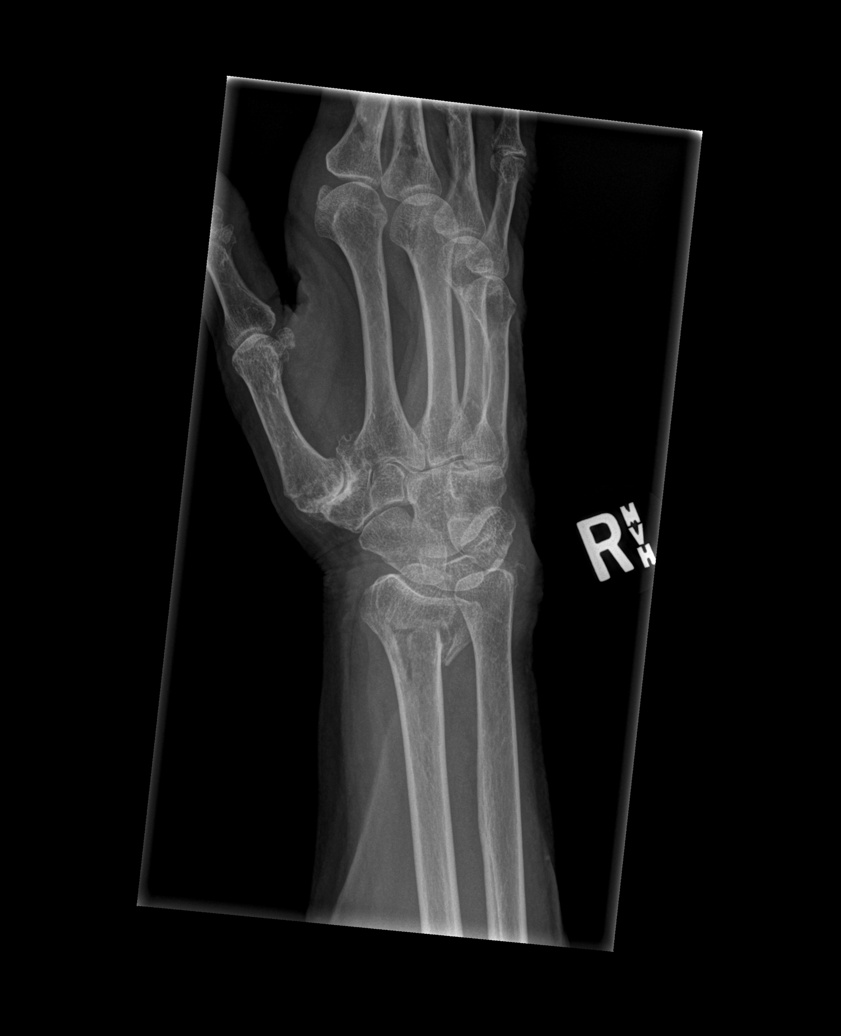

[x wrist lat right]
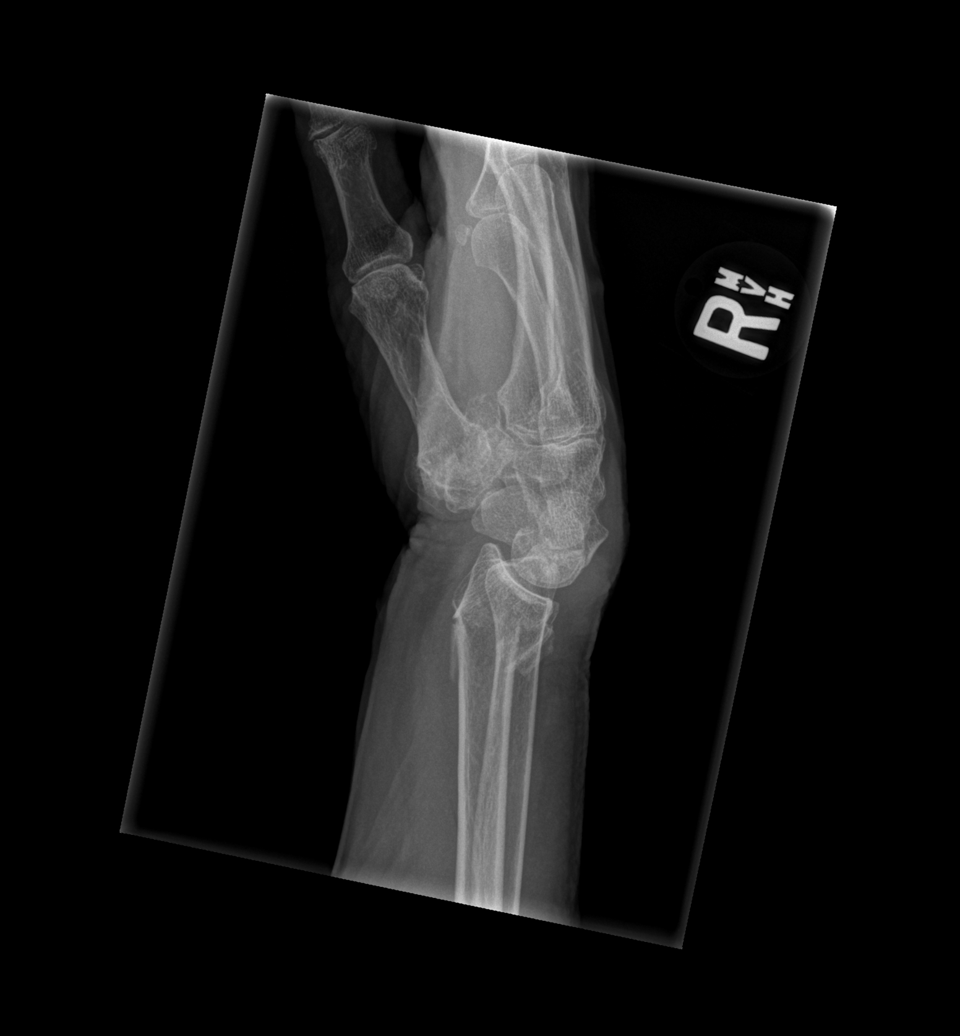

[x wrist navicular view right]
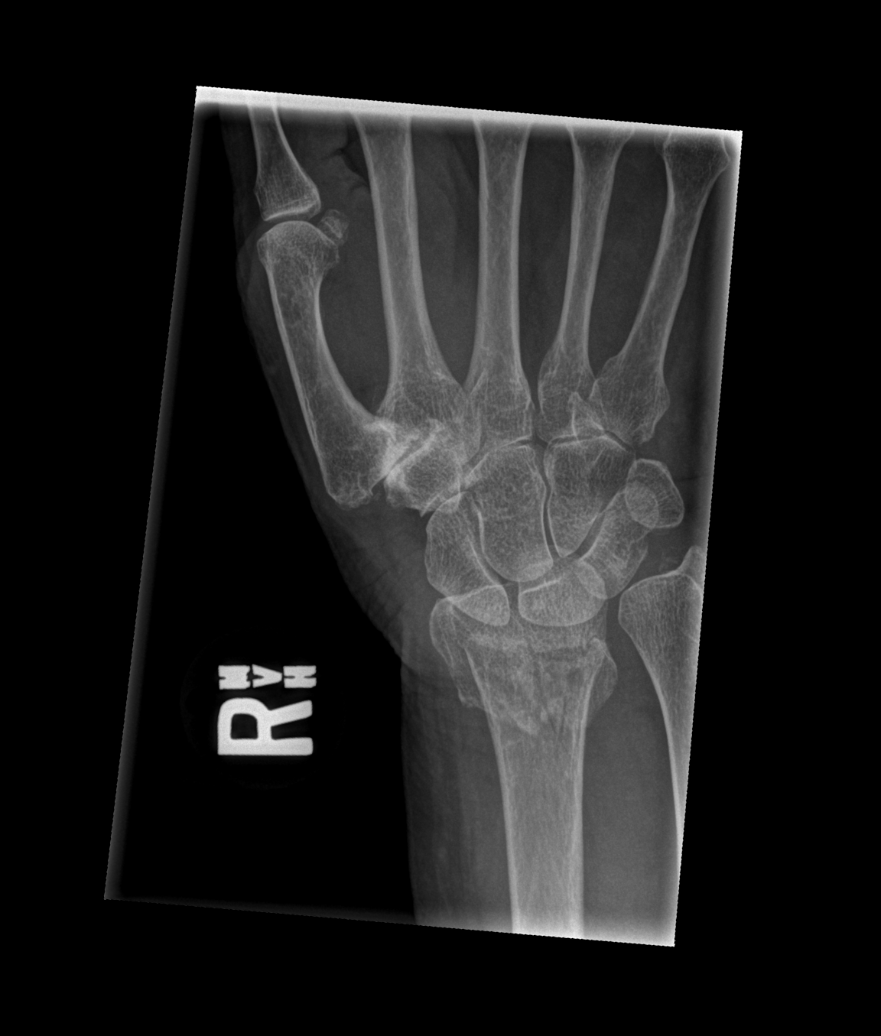

[4 of 4 positions shown; findings below may reference images not displayed]

FINDINGS: Frontal, oblique, lateral, and ulnar deviation scaphoid images were
obtained. There is a comminuted fracture of the distal radial
metaphysis with dorsal angulation bilaterally. No other fracture
evident. No dislocation. There is calcification in the triangular
fibrocartilage. There is advanced osteoarthritis in the first carpal
-metacarpal joint. There is moderate osteoarthritic change in the
scaphotrapezial joint. No erosive changes.
IMPRESSION: Comminuted fracture distal radial metaphysis with dorsal angulation
distally. No other fracture. No dislocation.

Calcification in the triangular fibrocartilage region raises
question of chronic tear in this area.

Areas of osteoarthritic change, most marked in the first
carpal-metacarpal joint.

## 2018-11-12 ENCOUNTER — Other Ambulatory Visit: Payer: Self-pay

## 2018-11-12 ENCOUNTER — Ambulatory Visit (INDEPENDENT_AMBULATORY_CARE_PROVIDER_SITE_OTHER): Payer: Medicare Other | Admitting: Neurology

## 2018-11-12 ENCOUNTER — Encounter: Payer: Self-pay | Admitting: Neurology

## 2018-11-12 VITALS — BP 145/79 | HR 74 | Temp 98.0°F | Ht 68.0 in | Wt 184.0 lb

## 2018-11-12 DIAGNOSIS — G609 Hereditary and idiopathic neuropathy, unspecified: Secondary | ICD-10-CM | POA: Diagnosis not present

## 2018-11-12 DIAGNOSIS — G2581 Restless legs syndrome: Secondary | ICD-10-CM | POA: Diagnosis not present

## 2018-11-12 DIAGNOSIS — I679 Cerebrovascular disease, unspecified: Secondary | ICD-10-CM | POA: Diagnosis not present

## 2018-11-12 HISTORY — DX: Cerebrovascular disease, unspecified: I67.9

## 2018-11-12 MED ORDER — CLOPIDOGREL BISULFATE 75 MG PO TABS: 75.0000 mg | ORAL_TABLET | Freq: Every day | ORAL | 3 refills | Status: DC

## 2018-11-12 MED ORDER — HYDROCODONE-ACETAMINOPHEN 7.5-325 MG PO TABS: 1.0000 | ORAL_TABLET | Freq: Four times a day (QID) | ORAL | 0 refills | Status: DC | PRN

## 2018-11-12 NOTE — Patient Instructions (Signed)
Start plavix instead of aspirin for stroke prevention.

## 2018-11-12 NOTE — Progress Notes (Signed)
Reason for visit: Peripheral neuropathy, restless leg syndrome, history of cerebrovascular disease  Shelley Coleman is an 78 y.o. female  History of present illness:  Shelley Coleman is a 78 year old right-handed white female with a history of cerebrovascular disease and prior stroke, and history of low back pain, restless leg syndrome, and peripheral neuropathy.  The patient has been given a trial on Cymbalta, Lyrica, gabapentin, and Keppra without benefit.  The patient has a lot of problems with stomach upset on medications, she has gone off of most of her medications, she takes Requip if needed at night for restless legs.  The patient has stopped her aspirin because of stomach upset, she is on no antiplatelet agents at this time.  She is not sleeping well at night, she has a tendency to fall asleep if she is inactive during the day.  She has a prior history of vertigo, if she gets up out of bed in the morning, she may feel staggery for several moments when she is walking.  She has not had any falls.  She returns to this office for an evaluation.  Past Medical History:  Diagnosis Date  . Anemia    Has had iron infusions  . Breast cancer ([place])    right  . Depression   . Depression   . GERD (gastroesophageal reflux disease)   . Hiatal hernia   . History of GI bleed   . Hx of mitral valve prolapse   . Hyperlipidemia   . Irregular heart beat   . Neuropathy    numbness in both feet  . Nocturnal leg cramps 09/02/2015  . Optic neuritis    left  . Osteopenia   . Ovarian cyst   . Parotid tumor    right  . Pelvic fracture ([place])   . Restless legs syndrome   . Shingles 2000   Left in V1  . Stroke [facility])     Past Surgical History:  Procedure Laterality Date  . ABDOMINAL HYSTERECTOMY    . APPENDECTOMY    . BREAST LUMPECTOMY Left   . CATARACT EXTRACTION    . ESOPHAGEAL MANOMETRY N/A 01/13/2013   Procedure: ESOPHAGEAL MANOMETRY (EM);  Surgeon: Sable Feil, MD;  [facility];   Service: Endoscopy;  Laterality: N/A;  . HERNIA REPAIR  08/2009  . MASTECTOMY Right   . SPINE SURGERY    . TOTAL HIP ARTHROPLASTY Left     Family History  Problem Relation Age of Onset  . Cancer Mother        Angio sarcoma   . Heart disease Father   . Hypertension Father   . Stroke Father        x2  . Heart attack Father        x 11  . Hernia Brother   . Hypertension Brother     Social history:  reports that she has never smoked. She has never used smokeless tobacco. She reports current alcohol use of about 1.0 standard drinks of alcohol per week. She reports that she does not use drugs.    Allergies  Allergen Reactions  . Diflunisal     REACTION: rash , swelling  anaphlaxsis  . Lipitor [Atorvastatin]     Pt had body joint pain  . Statins     Myalgia    . Tetanus Toxoid     REACTION: arm swelling, fever    Medications:  Prior to Admission medications   Medication Sig Start Date End  Date Taking? Authorizing Provider  atenolol (TENORMIN) 50 MG tablet TAKE 1 TABLET BY MOUTH ONCE DAILY 06/25/18  Yes Nafziger, Tommi Rumps, NP  DEXILANT 60 MG capsule TAKE 1 CAPSULE BY MOUTH ONCE DAILY 05/28/18  Yes Nafziger, Tommi Rumps, NP  ALPRAZolam Duanne Moron) 1 MG tablet TAKE 1 TABLET BY MOUTH AT BEDTIME AS NEEDED Patient not taking: Reported on 11/12/2018 01/21/18   Dorothyann Peng, NP  aspirin 325 MG tablet Take 1 tablet (325 mg total) by mouth daily. Patient not taking: Reported on 11/12/2018 11/02/15   Reyne Dumas, MD  hydrochlorothiazide (HYDRODIURIL) 25 MG tablet Take 1 tablet (25 mg total) by mouth daily. Patient not taking: Reported on 11/12/2018 04/28/16   Dorothyann Peng, NP  HYDROcodone-acetaminophen (NORCO) 7.5-325 MG tablet Take 1 tablet by mouth every 6 (six) hours as needed. Patient not taking: Reported on 11/12/2018 09/27/17   Kathrynn Ducking, MD  levETIRAcetam (KEPPRA) 500 MG tablet Take 1 tablet (500 mg total) by mouth at bedtime. Patient not taking: Reported on 11/12/2018 04/09/18   Kathrynn Ducking, MD  MELATONIN PO Take 1 tablet by mouth at bedtime as needed (for sleep).     [provider]  Multiple Vitamin (MULTIVITAMIN) tablet Take 1 tablet by mouth daily.     [provider]  nitroGLYCERIN (NITROSTAT) 0.4 MG SL tablet PLACE ONE TABLET UNDER THE TONGUE EVERY 5 MINUTES AS NEEDED. Patient not taking: Reported on 11/12/2018 10/10/16   Dorothyann Peng, NP  rOPINIRole (REQUIP) 1 MG tablet TAKE 1 TABLET BY MOUTH AT 5 PM AND 1 TAB AT BEDTIME Patient not taking: Reported on 11/12/2018 04/09/18   Kathrynn Ducking, MD  rOPINIRole (REQUIP) 3 MG tablet TAKE ONE TABLET BY MOUTH ONCE DAILY AT BEDTIME Patient not taking: Reported on 11/12/2018 06/18/17   Kathrynn Ducking, MD    ROS:  Out of a complete 14 system review of symptoms, the patient complains only of the following symptoms, and all other reviewed systems are negative.  Fatigue, drowsiness Insomnia Back pain  Height 5\' 8"  (1.727 m), weight 184 lb (83.5 kg).  Physical Exam  General: The patient is alert and cooperative at the time of the examination.  Skin: No significant peripheral edema is noted.   Neurologic Exam  Mental status: The patient is alert and oriented x 3 at the time of the examination. The patient has apparent normal recent and remote memory, with an apparently normal attention span and concentration ability.   Cranial nerves: Facial symmetry is present. Speech is normal, no aphasia or dysarthria is noted. Extraocular movements are full. Visual fields are full.  Motor: The patient has good strength in all 4 extremities.  Sensory examination: Soft touch sensation is symmetric on the face, arms, and legs.  Coordination: The patient has good finger-nose-finger and heel-to-shin bilaterally.  Gait and station: The patient has a normal gait. Tandem gait is normal. Romberg is negative. No drift is seen.  Reflexes: Deep tendon reflexes are symmetric.   Assessment/Plan:  1.  History of  restless leg syndrome  2.  Cerebrovascular disease  3.  Peripheral neuropathy  4.  Chronic low back pain  5.  Chronic insomnia  The patient will be switched to Plavix as she is not taking her aspirin because of upset stomach.  The patient will contact our office if she wants to go on another medication for her neuropathy pain, we may consider Trileptal or carbamazepine.  The patient will be given a small prescription for hydrocodone, she may use  this occasionally to help some of the back pain and this will allow her to sleep better.  She will follow-up here in about 6 months.  Jill Alexanders MD 11/12/2018 12:12 PM  [facility] [address] [place], [place] [address]  Phone [phone] Fax [phone]

## 2019-01-14 DIAGNOSIS — Z23 Encounter for immunization: Secondary | ICD-10-CM | POA: Diagnosis not present

## 2019-02-26 ENCOUNTER — Telehealth: Payer: Self-pay | Admitting: Family Medicine

## 2019-02-26 NOTE — Telephone Encounter (Signed)
Copied from [place] [phone] [id number]. Topic: General - Other >> Feb 26, 2019  2:41 PM Leward Quan A wrote: Reason for CRM: Patient called to say that on 02/25/2019 she had a runny nose, and sneezing, woke up this morning with a sore throat asking Dorothyann Peng if she should go get tested for covid. Per patient she have been staying in the house only went to the supermarket on 02/12/2019. Asking for a call back with some advise. Please call patient at  Ph# [phone]

## 2019-02-26 NOTE — Telephone Encounter (Signed)
Pt advised to stay at home until telephone visit on 02/27/2019.  Pt will then be evaluated by Tommi Rumps and given instruction on what to do next.

## 2019-02-27 ENCOUNTER — Other Ambulatory Visit: Payer: Self-pay

## 2019-02-27 ENCOUNTER — Telehealth (INDEPENDENT_AMBULATORY_CARE_PROVIDER_SITE_OTHER): Payer: Medicare Other | Admitting: Adult Health

## 2019-02-27 DIAGNOSIS — J019 Acute sinusitis, unspecified: Secondary | ICD-10-CM | POA: Diagnosis not present

## 2019-02-27 DIAGNOSIS — B9789 Other viral agents as the cause of diseases classified elsewhere: Secondary | ICD-10-CM | POA: Diagnosis not present

## 2019-02-27 MED ORDER — FLUTICASONE PROPIONATE 50 MCG/ACT NA SUSP: 2.0000 | Freq: Every day | NASAL | 0 refills | Status: DC

## 2019-02-27 NOTE — Addendum Note (Signed)
Addended by: Miles Costain T on: 02/27/2019 03:13 PM   Modules accepted: Orders

## 2019-02-27 NOTE — Progress Notes (Signed)
Virtual Visit via Telephone Note  I connected with Shelley Coleman on 02/27/19 at  1:30 PM EDT by telephone and verified that I am speaking with the correct person using two identifiers.   I discussed the limitations, risks, security and privacy concerns of performing an evaluation and management service by telephone and the availability of in person appointments. I also discussed with the patient that there may be a patient responsible charge related to this service. The patient expressed understanding and agreed to proceed.  Location patient: home Location provider: work or home office Participants present for the call: patient, provider Patient did not have a visit in the prior 7 days to address this/these issue(s).   History of Present Illness: 78 year old female who is being evaluated today for an acute issue.  Her symptoms have been present for 4 days.  Her symptoms include runny nose with clear rhinorrhea, body aches, sore throat, and fatigue.  She denies fevers, chills, cough, shortness of breath, or loss of taste or smell.  She has been isolating at home and only goes out every 4 weeks to get groceries.  The last time when she was out was the same day her symptoms started.  She has been using Zicam for the last few days without any relief.   Observations/Objective: Patient sounds cheerful and well on the phone. I do not appreciate any SOB. Speech and thought processing are grossly intact. Patient reported vitals:  Assessment and Plan: 1. Acute viral sinusitis -Doubt Covid since she has been isolating.  More likely viral sinusitis.  She was advised to use Flonase in okay to continue with Zicam.  She can be tested for Covid at [facility] tomorrow if she would like.  She does not need a referral for me.  Advise follow-up if no improvement over the next 4 days.   Follow Up Instructions:   I did not refer this patient for an OV in the next 24 hours for this/these  issue(s).  I discussed the assessment and treatment plan with the patient. The patient was provided an opportunity to ask questions and all were answered. The patient agreed with the plan and demonstrated an understanding of the instructions.   The patient was advised to call back or seek an in-person evaluation if the symptoms worsen or if the condition fails to improve as anticipated.  I provided 20 minutes of non-face-to-face time during this encounter.   Dorothyann Peng, NP

## 2019-05-16 ENCOUNTER — Other Ambulatory Visit: Payer: Self-pay | Admitting: Adult Health

## 2019-05-16 MED ORDER — DEXILANT 60 MG PO CPDR: 1.0000 | DELAYED_RELEASE_CAPSULE | Freq: Every day | ORAL | 2 refills | Status: DC

## 2019-05-16 NOTE — Telephone Encounter (Signed)
Requested Prescriptions  Pending Prescriptions Disp Refills  . dexlansoprazole (DEXILANT) 60 MG capsule 90 capsule 2    Sig: Take 1 capsule (60 mg total) by mouth daily.     Gastroenterology: Proton Pump Inhibitors Passed - 05/16/2019 12:37 PM      Passed - Valid encounter within last 12 months    Recent Outpatient Visits          2 months ago Acute viral sinusitis   Therapist, [facility] at [place], NP   1 year ago Mixed hyperlipidemia   Therapist, [facility] at [facility], [place], NP   1 year ago Hematuria, unspecified type   Therapist, [facility] at [facility], [place], NP   2 years ago Mixed hyperlipidemia   Therapist, music at [facility], [place], NP   3 years ago Essential hypertension   Therapist, music at [facility], [place], NP

## 2019-05-16 NOTE — Telephone Encounter (Signed)
Medication Refill - Medication: DEXILANT 60 MG capsule  Has the patient contacted their pharmacy? Yes - states the pharmacy hasn't heard from [place].  Pt has been given one ER supply from the pharmacy and still no response from [place] - she is completely out. (Agent: If no, request that the patient contact the pharmacy for the refill.) (Agent: If yes, when and what did the pharmacy advise?)  Preferred Pharmacy (with phone number or street name):  [place], [place] - [id number] Phone:  [phone]  Fax:  [phone]     Agent: Please be advised that RX refills may take up to 3 business days. We ask that you follow-up with your pharmacy.

## 2019-05-28 ENCOUNTER — Ambulatory Visit: Payer: Self-pay

## 2019-06-03 ENCOUNTER — Other Ambulatory Visit: Payer: Self-pay | Admitting: Adult Health

## 2019-06-04 NOTE — Telephone Encounter (Signed)
Left message to return phone call.

## 2019-06-04 NOTE — Telephone Encounter (Signed)
Patient need to schedule an ov for more refills. 

## 2019-06-05 ENCOUNTER — Ambulatory Visit: Payer: Medicare Other | Attending: Internal Medicine

## 2019-06-05 DIAGNOSIS — Z23 Encounter for immunization: Secondary | ICD-10-CM | POA: Insufficient documentation

## 2019-06-05 NOTE — Progress Notes (Signed)
   Covid-19 Vaccination Clinic  Name:  Shelley Coleman    MRN: [id number] DOB: 1941-04-05  06/05/2019  [name] was observed post Covid-19 immunization for 15 minutes without incidence. She was provided with Vaccine Information Sheet and instruction to access the V-Safe system.   [name] was instructed to call 911 with any severe reactions post vaccine: Marland Kitchen Difficulty breathing  . Swelling of your face and throat  . A fast heartbeat  . A bad rash all over your body  . Dizziness and weakness    Immunizations Administered    Name Date Dose VIS Date Route   Pfizer COVID-19 Vaccine 06/05/2019  5:03 PM 0.3 mL 04/11/2019 Intramuscular   Manufacturer: [place]   Lot: [id number]   [place]: [id number]

## 2019-06-06 ENCOUNTER — Other Ambulatory Visit: Payer: Self-pay | Admitting: Adult Health

## 2019-06-06 NOTE — Telephone Encounter (Signed)
Called pt again no answer. Left detailed vm.

## 2019-06-06 NOTE — Telephone Encounter (Signed)
Okay for refill?   Pt wanting Rx filled today if possible

## 2019-06-06 NOTE — Telephone Encounter (Signed)
Pt following up on refill medications. Pt was told by [facility], that Atenolol 50 MG tablet and ALPRAZolam 1 MG tablet have been denied. Pt has questions and does not know why they have been denied. Thanks   Pt's # [phone]

## 2019-06-10 ENCOUNTER — Other Ambulatory Visit: Payer: Self-pay

## 2019-06-10 ENCOUNTER — Telehealth: Payer: Medicare Other | Admitting: Adult Health

## 2019-06-11 NOTE — Progress Notes (Signed)
Erroneous entry

## 2019-07-01 ENCOUNTER — Ambulatory Visit: Payer: Medicare Other | Attending: Internal Medicine

## 2019-07-01 DIAGNOSIS — Z23 Encounter for immunization: Secondary | ICD-10-CM | POA: Insufficient documentation

## 2019-07-01 NOTE — Progress Notes (Signed)
   Covid-19 Vaccination Clinic  Name:  Shelley Coleman    MRN: [id number] DOB: Jun 26, 1940  07/01/2019  [name] was observed post Covid-19 immunization for 15 minutes without incident. She was provided with Vaccine Information Sheet and instruction to access the V-Safe system.   [name] was instructed to call 911 with any severe reactions post vaccine: Marland Kitchen Difficulty breathing  . Swelling of face and throat  . A fast heartbeat  . A bad rash all over body  . Dizziness and weakness   Immunizations Administered    Name Date Dose VIS Date Route   Pfizer COVID-19 Vaccine 07/01/2019  1:52 PM 0.3 mL 04/11/2019 Intramuscular   Manufacturer: [place]   Lot: [id number]   [place]: [id number]

## 2019-08-27 ENCOUNTER — Other Ambulatory Visit: Payer: Self-pay

## 2019-08-28 ENCOUNTER — Encounter: Payer: Self-pay | Admitting: Adult Health

## 2019-08-28 ENCOUNTER — Ambulatory Visit (INDEPENDENT_AMBULATORY_CARE_PROVIDER_SITE_OTHER): Payer: Medicare Other | Admitting: Adult Health

## 2019-08-28 VITALS — BP 150/90 | Temp 98.6°F | Ht 67.0 in | Wt 180.0 lb

## 2019-08-28 DIAGNOSIS — G2581 Restless legs syndrome: Secondary | ICD-10-CM | POA: Diagnosis not present

## 2019-08-28 DIAGNOSIS — J029 Acute pharyngitis, unspecified: Secondary | ICD-10-CM

## 2019-08-28 DIAGNOSIS — I679 Cerebrovascular disease, unspecified: Secondary | ICD-10-CM

## 2019-08-28 DIAGNOSIS — R300 Dysuria: Secondary | ICD-10-CM

## 2019-08-28 DIAGNOSIS — K2100 Gastro-esophageal reflux disease with esophagitis, without bleeding: Secondary | ICD-10-CM

## 2019-08-28 DIAGNOSIS — I10 Essential (primary) hypertension: Secondary | ICD-10-CM

## 2019-08-28 DIAGNOSIS — E782 Mixed hyperlipidemia: Secondary | ICD-10-CM

## 2019-08-28 LAB — URINALYSIS, ROUTINE W REFLEX MICROSCOPIC
Bilirubin Urine: NEGATIVE
Ketones, ur: NEGATIVE
Nitrite: POSITIVE — AB
Specific Gravity, Urine: 1.02 (ref 1.000–1.030)
Urine Glucose: NEGATIVE
Urobilinogen, UA: 0.2 (ref 0.0–1.0)
pH: 5.5 (ref 5.0–8.0)

## 2019-08-28 LAB — CBC WITH DIFFERENTIAL/PLATELET
Basophils Absolute: 0.1 10*3/uL (ref 0.0–0.1)
Basophils Relative: 1.4 % (ref 0.0–3.0)
Eosinophils Absolute: 0.1 10*3/uL (ref 0.0–0.7)
Eosinophils Relative: 1.8 % (ref 0.0–5.0)
HCT: 41.4 % (ref 36.0–46.0)
Hemoglobin: 13.7 g/dL (ref 12.0–15.0)
Lymphocytes Relative: 24.5 % (ref 12.0–46.0)
Lymphs Abs: 1.9 10*3/uL (ref 0.7–4.0)
MCHC: 33.2 g/dL (ref 30.0–36.0)
MCV: 86.6 fl (ref 78.0–100.0)
Monocytes Absolute: 0.6 10*3/uL (ref 0.1–1.0)
Monocytes Relative: 8.1 % (ref 3.0–12.0)
Neutro Abs: 5 10*3/uL (ref 1.4–7.7)
Neutrophils Relative %: 64.2 % (ref 43.0–77.0)
Platelets: 287 10*3/uL (ref 150.0–400.0)
RBC: 4.78 Mil/uL (ref 3.87–5.11)
RDW: 14.8 % (ref 11.5–15.5)
WBC: 7.7 10*3/uL (ref 4.0–10.5)

## 2019-08-28 LAB — LIPID PANEL
Cholesterol: 252 mg/dL — ABNORMAL HIGH (ref 0–200)
HDL: 56.6 mg/dL (ref >39.00)
LDL Cholesterol: 158 mg/dL — ABNORMAL HIGH (ref 0–99)
NonHDL: 195.29
Total CHOL/HDL Ratio: 4
Triglycerides: 186 mg/dL — ABNORMAL HIGH (ref 0.0–149.0)
VLDL: 37.2 mg/dL (ref 0.0–40.0)

## 2019-08-28 LAB — COMPREHENSIVE METABOLIC PANEL
ALT: 10 U/L (ref 0–35)
AST: 13 U/L (ref 0–37)
Albumin: 4.3 g/dL (ref 3.5–5.2)
Alkaline Phosphatase: 95 U/L (ref 39–117)
BUN: 20 mg/dL (ref 6–23)
CO2: 31 mEq/L (ref 19–32)
Calcium: 9.5 mg/dL (ref 8.4–10.5)
Chloride: 104 mEq/L (ref 96–112)
Creatinine, Ser: 0.72 mg/dL (ref 0.40–1.20)
GFR: 78.52 mL/min (ref >60.00)
Glucose, Bld: 96 mg/dL (ref 70–99)
Potassium: 5.4 mEq/L — ABNORMAL HIGH (ref 3.5–5.1)
Sodium: 142 mEq/L (ref 135–145)
Total Bilirubin: 0.3 mg/dL (ref 0.2–1.2)
Total Protein: 7.1 g/dL (ref 6.0–8.3)

## 2019-08-28 LAB — TSH: TSH: 2.35 u[IU]/mL (ref 0.35–4.50)

## 2019-08-28 LAB — POCT RAPID STREP A (OFFICE): Rapid Strep A Screen: POSITIVE — AB

## 2019-08-28 MED ORDER — AMOXICILLIN-POT CLAVULANATE 875-125 MG PO TABS: 1.0000 | ORAL_TABLET | Freq: Two times a day (BID) | ORAL | 0 refills | Status: DC

## 2019-08-28 NOTE — Progress Notes (Signed)
Subjective:    Patient ID: Shelley Coleman, female    DOB: 1940-11-13, 79 y.o.   MRN: [id number]  HPI  Patient presents for yearly preventative medicine examination. He is a pleasant 80 year old female who  has a past medical history of Anemia, Breast cancer ([place]), Cerebrovascular disease (11/12/2018), Depression, Depression, GERD (gastroesophageal reflux disease), Hiatal hernia, History of GI bleed, mitral valve prolapse, Hyperlipidemia, Irregular heart beat, Neuropathy, Nocturnal leg cramps (09/02/2015), Optic neuritis, Osteopenia, Ovarian cyst, Parotid tumor, Pelvic fracture ([place]), Restless legs syndrome, Shingles (2000), and Stroke ([place]).  Essential hypertension-controlled with atenolol 50 mg and HCTZ 25 mg as needed.  She denies headaches, blurred vision, lightheadedness, or syncopal episodes. Did not take medications this morning  BP Readings from Last 3 Encounters:  08/28/19 (!) 150/90  11/12/18 (!) 145/79  04/09/18 128/75   Insomnia-take Xanax 0.5 to 1 mg as needed.  She uses this sparingly.  Her last refill was in February 2021  Restless leg syndrome-is prescribed Requip by neurology. She is only taking PRN  GERD-trolled with Dexilant  Hyperlipidemia - intolerant to multiple statins  Lab Results  Component Value Date   CHOL 268 (H) 03/27/2018   HDL 62.40 03/27/2018   LDLCALC 181 (H) 03/27/2018   LDLDIRECT 168.5 02/22/2012   TRIG 124.0 03/27/2018   CHOLHDL 4 03/27/2018   H/o CVA - is on Plavix currently. ASA gave her upset stomach. She denies issues with bleeding.   Dysuria - has presented over the last few weeks. She has been taking Azo sporadically and reports that this has helped. She continues to have mild pain after urination.    Sore throat - has been present for three weeks but has slowly gotten better. She denies difficulty swallowing, fevers, or chills. She did notice some " white patches" on the right tonsil.   All immunizations and health maintenance protocols were  reviewed with the patient and needed orders were placed.  Appropriate screening laboratory values were ordered for the patient including screening of hyperlipidemia, renal function and hepatic function.  Medication reconciliation,  past medical history, social history, problem list and allergies were reviewed in detail with the patient  Goals were established with regard to weight loss, exercise, and  diet in compliance with medications.  She does not exercise but stays very active, she tries to eat a low salt heart healthy diet. End of life planning was discussed.   Review of Systems  Constitutional: Negative.   HENT: Positive for hearing loss and sore throat. Negative for trouble swallowing and voice change.   Eyes: Negative.   Respiratory: Negative.   Cardiovascular: Negative.   Gastrointestinal: Negative.   Endocrine: Negative.   Genitourinary: Positive for dysuria. Negative for difficulty urinating, frequency, hematuria and urgency.  Musculoskeletal: Negative.   Allergic/Immunologic: Negative.   Neurological: Negative.   Hematological: Negative.   All other systems reviewed and are negative.    Past Medical History:  Diagnosis Date  . Anemia    Has had iron infusions  . Breast cancer ([place])    right  . Cerebrovascular disease 11/12/2018  . Depression   . Depression   . GERD (gastroesophageal reflux disease)   . Hiatal hernia   . History of GI bleed   . Hx of mitral valve prolapse   . Hyperlipidemia   . Irregular heart beat   . Neuropathy    numbness in both feet  . Nocturnal leg cramps 09/02/2015  . Optic neuritis    left  .  Osteopenia   . Ovarian cyst   . Parotid tumor    right  . Pelvic fracture ([place])   . Restless legs syndrome   . Shingles 2000   Left in V1  . Stroke [facility])     Social History   Socioeconomic History  . Marital status: Married    Spouse name: Not on file  . Number of children: 2  . Years of education: 15  . Highest education level:  Not on file  Occupational History  . Occupation: Retired Therapist, sports  Tobacco Use  . Smoking status: Never Smoker  . Smokeless tobacco: Never Used  Substance and Sexual Activity  . Alcohol use: Yes    Alcohol/week: 1.0 standard drinks    Types: 1 Glasses of wine per week    Comment: Rarely,socially  . Drug use: No  . Sexual activity: Not on file  Other Topics Concern  . Not on file  Social History Narrative   Retired from nursing    Widowed    Left-handed      Social Determinants of Health   Financial Resource Strain:   . Difficulty of Paying Living Expenses:   Food Insecurity:   . Worried About Charity fundraiser in the Last Year:   . Arboriculturist in the Last Year:   Transportation Needs:   . Film/video editor (Medical):   Marland Kitchen Lack of Transportation (Non-Medical):   Physical Activity:   . Days of Exercise per Week:   . Minutes of Exercise per Session:   Stress:   . Feeling of Stress :   Social Connections:   . Frequency of Communication with Friends and Family:   . Frequency of Social Gatherings with Friends and Family:   . Attends Religious Services:   . Active Member of Clubs or Organizations:   . Attends Archivist Meetings:   Marland Kitchen Marital Status:   Intimate Partner Violence:   . Fear of Current or Ex-Partner:   . Emotionally Abused:   Marland Kitchen Physically Abused:   . Sexually Abused:     Past Surgical History:  Procedure Laterality Date  . ABDOMINAL HYSTERECTOMY    . APPENDECTOMY    . BREAST LUMPECTOMY Left   . CATARACT EXTRACTION    . ESOPHAGEAL MANOMETRY N/A 01/13/2013   Procedure: ESOPHAGEAL MANOMETRY (EM);  Surgeon: Sable Feil, MD;  Location: [facility];  Service: Endoscopy;  Laterality: N/A;  . HERNIA REPAIR  08/2009  . MASTECTOMY Right   . SPINE SURGERY    . TOTAL HIP ARTHROPLASTY Left     Family History  Problem Relation Age of Onset  . Cancer Mother        Angio sarcoma   . Heart disease Father   . Hypertension Father   . Stroke  Father        x2  . Heart attack Father        x 11  . Hernia Brother   . Hypertension Brother     Allergies  Allergen Reactions  . Diflunisal     REACTION: rash , swelling  anaphlaxsis  . Lipitor [Atorvastatin]     Pt had body joint pain  . Statins     Myalgia    . Tetanus Toxoid     REACTION: arm swelling, fever    Current Outpatient Medications on File Prior to Visit  Medication Sig Dispense Refill  . ALPRAZolam (XANAX) 1 MG tablet TAKE 1 TABLET BY MOUTH AT BEDTIME  AS NEEDED 30 tablet 0  . atenolol (TENORMIN) 50 MG tablet Take 1 tablet by mouth once daily 90 tablet 0  . clopidogrel (PLAVIX) 75 MG tablet Take 1 tablet (75 mg total) by mouth daily. 90 tablet 3  . dexlansoprazole (DEXILANT) 60 MG capsule Take 1 capsule (60 mg total) by mouth daily. 90 capsule 2  . fluticasone (FLONASE) 50 MCG/ACT nasal spray Place 2 sprays into both nostrils daily. 16 mL 0  . hydrochlorothiazide (HYDRODIURIL) 25 MG tablet Take 1 tablet (25 mg total) by mouth daily. 30 tablet 1  . MELATONIN PO Take 1 tablet by mouth at bedtime as needed (for sleep).     . nitroGLYCERIN (NITROSTAT) 0.4 MG SL tablet PLACE ONE TABLET UNDER THE TONGUE EVERY 5 MINUTES AS NEEDED. 100 tablet 0   No current facility-administered medications on file prior to visit.    BP (!) 150/90   Temp 98.6 F (37 C)   Ht 5\' 7"  (1.702 m)   Wt 180 lb (81.6 kg)   BMI 28.19 kg/m       Objective:   Physical Exam Vitals and nursing note reviewed.  Constitutional:      General: She is not in acute distress.    Appearance: Normal appearance. She is well-developed. She is not ill-appearing.  HENT:     Head: Normocephalic and atraumatic.     Right Ear: Tympanic membrane, ear canal and external ear normal. There is no impacted cerumen.     Left Ear: Tympanic membrane, ear canal and external ear normal. There is no impacted cerumen.     Nose: Nose normal. No congestion or rhinorrhea.     Mouth/Throat:     Mouth: Mucous  membranes are moist.     Pharynx: Uvula midline. Pharyngeal swelling and posterior oropharyngeal erythema present. No oropharyngeal exudate.     Tonsils: Tonsillar exudate present. 2+ on the right.  Eyes:     General:        Right eye: No discharge.        Left eye: No discharge.     Extraocular Movements: Extraocular movements intact.     Conjunctiva/sclera: Conjunctivae normal.     Pupils: Pupils are equal, round, and reactive to light.  Neck:     Thyroid: No thyromegaly.     Vascular: No carotid bruit.     Trachea: No tracheal deviation.  Cardiovascular:     Rate and Rhythm: Normal rate and regular rhythm.     Pulses: Normal pulses.     Heart sounds: Normal heart sounds. No murmur. No friction rub. No gallop.   Pulmonary:     Effort: Pulmonary effort is normal. No respiratory distress.     Breath sounds: Normal breath sounds. No stridor. No wheezing, rhonchi or rales.  Chest:     Chest wall: No tenderness.  Abdominal:     General: Abdomen is flat. Bowel sounds are normal. There is no distension.     Palpations: Abdomen is soft. There is no mass.     Tenderness: There is abdominal tenderness in the suprapubic area. There is no right CVA tenderness, left CVA tenderness, guarding or rebound.     Hernia: No hernia is present.  Musculoskeletal:        General: No swelling, tenderness, deformity or signs of injury. Normal range of motion.     Cervical back: Normal range of motion and neck supple.     Right lower leg: No edema.     Left  lower leg: No edema.  Lymphadenopathy:     Cervical: No cervical adenopathy.  Skin:    General: Skin is warm and dry.     Coloration: Skin is not jaundiced or pale.     Findings: No bruising, erythema, lesion or rash.  Neurological:     General: No focal deficit present.     Mental Status: She is alert and oriented to person, place, and time.     Cranial Nerves: No cranial nerve deficit.     Sensory: No sensory deficit.     Motor: No weakness.       Coordination: Coordination normal.     Gait: Gait normal.     Deep Tendon Reflexes: Reflexes normal.  Psychiatric:        Mood and Affect: Mood normal.        Behavior: Behavior normal.        Thought Content: Thought content normal.        Judgment: Judgment normal.       Assessment & Plan:  1. Essential hypertension - Encouraged to monitor at home - No change in medication at this time  - Urinalysis - Culture, Urine - CBC with Differential/Platelet - Comprehensive metabolic panel - Lipid panel - TSH  2. Mixed hyperlipidemia Consider trial of fenofibrate - Urinalysis - Culture, Urine - CBC with Differential/Platelet - Comprehensive metabolic panel - Lipid panel - TSH  3. Gastroesophageal reflux disease with esophagitis without hemorrhage - Continue with Dexilant  - Urinalysis - Culture, Urine - CBC with Differential/Platelet - Comprehensive metabolic panel - Lipid panel - TSH  4. Sore throat - Strep Positive. Will treat with Augmentin  - POC Rapid Strep A  5. Dysuria  - Urinalysis - Culture, Urine    6. Restless legs syndrome (RLS) - Continue to follow up with neurology as directed   7. Cerebrovascular disease - Continue with Plavix. Follow up with Neurology  - CBC with Differential/Platelet - Comprehensive metabolic panel - Lipid panel - TSH  Dorothyann Peng, NP

## 2019-08-29 ENCOUNTER — Other Ambulatory Visit: Payer: Self-pay | Admitting: Family Medicine

## 2019-08-29 MED ORDER — FENOFIBRATE 145 MG PO TABS
145.0000 mg | ORAL_TABLET | Freq: Every day | ORAL | 3 refills | Status: DC
Start: 2019-08-29 — End: 2020-04-22

## 2019-09-22 ENCOUNTER — Other Ambulatory Visit: Payer: Self-pay | Admitting: Adult Health

## 2019-10-06 ENCOUNTER — Telehealth: Payer: Self-pay | Admitting: Adult Health

## 2019-10-06 NOTE — Telephone Encounter (Signed)
Pt states he started having a UTI again (burning when urinating, ) starting Saturday Morning. She did not want to go to Urgent Care and spoke to her neighbor about it. Her neighbor gave her cephalexin 500 mg to take twice a day. She started Saturday and has enough to get her through tomorrow and that she feels much better. She is calling to report that she has been taking it and is not sure if she has taken it long enough to actually treat her UTI?   Pt can be reached at [phone]

## 2019-10-07 ENCOUNTER — Ambulatory Visit (INDEPENDENT_AMBULATORY_CARE_PROVIDER_SITE_OTHER): Payer: Medicare Other | Admitting: Adult Health

## 2019-10-07 ENCOUNTER — Encounter: Payer: Self-pay | Admitting: Adult Health

## 2019-10-07 ENCOUNTER — Other Ambulatory Visit: Payer: Self-pay

## 2019-10-07 VITALS — BP 124/80 | Temp 97.3°F | Wt 179.0 lb

## 2019-10-07 DIAGNOSIS — R300 Dysuria: Secondary | ICD-10-CM

## 2019-10-07 LAB — POC URINALSYSI DIPSTICK (AUTOMATED)
Blood, UA: NEGATIVE
Glucose, UA: NEGATIVE
Ketones, UA: NEGATIVE
Leukocytes, UA: NEGATIVE
Nitrite, UA: NEGATIVE
Protein, UA: NEGATIVE
Spec Grav, UA: 1.025 (ref 1.010–1.025)
Urobilinogen, UA: 0.2 E.U./dL
pH, UA: 6 (ref 5.0–8.0)

## 2019-10-07 NOTE — Telephone Encounter (Signed)
Pt is scheduled for today 6/8 at 2pm

## 2019-10-07 NOTE — Progress Notes (Signed)
Subjective:    Patient ID: Shelley Coleman, female    DOB: April 28, 1941, 79 y.o.   MRN: [id number]  HPI 79 year old female who  has a past medical history of Anemia, Breast cancer ([place]), Cerebrovascular disease (11/12/2018), Depression, Depression, GERD (gastroesophageal reflux disease), Hiatal hernia, History of GI bleed, mitral valve prolapse, Hyperlipidemia, Irregular heart beat, Neuropathy, Nocturnal leg cramps (09/02/2015), Optic neuritis, Osteopenia, Ovarian cyst, Parotid tumor, Pelvic fracture ([place]), Restless legs syndrome, Shingles (2000), and Stroke ([place]).  She presents to the office today for concern of a UTI. She reports that her symptoms started three days ago. Her symptoms included dysuria, and dark cloudy urine. She started taking her neighbors Keflex for 3.5 days because she did not want to go to ER or UC over the weekend   Symptoms have since resolved. She is wondering if she needs to continue abx therapy    Review of Systems See HPI   Past Medical History:  Diagnosis Date  . Anemia    Has had iron infusions  . Breast cancer ([place])    right  . Cerebrovascular disease 11/12/2018  . Depression   . Depression   . GERD (gastroesophageal reflux disease)   . Hiatal hernia   . History of GI bleed   . Hx of mitral valve prolapse   . Hyperlipidemia   . Irregular heart beat   . Neuropathy    numbness in both feet  . Nocturnal leg cramps 09/02/2015  . Optic neuritis    left  . Osteopenia   . Ovarian cyst   . Parotid tumor    right  . Pelvic fracture ([place])   . Restless legs syndrome   . Shingles 2000   Left in V1  . Stroke [facility])     Social History   Socioeconomic History  . Marital status: Married    Spouse name: Not on file  . Number of children: 2  . Years of education: 82  . Highest education level: Not on file  Occupational History  . Occupation: Retired Therapist, sports  Tobacco Use  . Smoking status: Never Smoker  . Smokeless tobacco: Never Used  Substance and Sexual  Activity  . Alcohol use: Yes    Alcohol/week: 1.0 standard drinks    Types: 1 Glasses of wine per week    Comment: Rarely,socially  . Drug use: No  . Sexual activity: Not on file  Other Topics Concern  . Not on file  Social History Narrative   Retired from nursing    Widowed    Left-handed      Social Determinants of Health   Financial Resource Strain:   . Difficulty of Paying Living Expenses:   Food Insecurity:   . Worried About Charity fundraiser in the Last Year:   . Arboriculturist in the Last Year:   Transportation Needs:   . Film/video editor (Medical):   Marland Kitchen Lack of Transportation (Non-Medical):   Physical Activity:   . Days of Exercise per Week:   . Minutes of Exercise per Session:   Stress:   . Feeling of Stress :   Social Connections:   . Frequency of Communication with Friends and Family:   . Frequency of Social Gatherings with Friends and Family:   . Attends Religious Services:   . Active Member of Clubs or Organizations:   . Attends Archivist Meetings:   Marland Kitchen Marital Status:   Intimate Partner Violence:   .  Fear of Current or Ex-Partner:   . Emotionally Abused:   Marland Kitchen Physically Abused:   . Sexually Abused:     Past Surgical History:  Procedure Laterality Date  . ABDOMINAL HYSTERECTOMY    . APPENDECTOMY    . BREAST LUMPECTOMY Left   . CATARACT EXTRACTION    . ESOPHAGEAL MANOMETRY N/A 01/13/2013   Procedure: ESOPHAGEAL MANOMETRY (EM);  Surgeon: Sable Feil, MD;  Location: [facility];  Service: Endoscopy;  Laterality: N/A;  . HERNIA REPAIR  08/2009  . MASTECTOMY Right   . SPINE SURGERY    . TOTAL HIP ARTHROPLASTY Left     Family History  Problem Relation Age of Onset  . Cancer Mother        Angio sarcoma   . Heart disease Father   . Hypertension Father   . Stroke Father        x2  . Heart attack Father        x 11  . Hernia Brother   . Hypertension Brother     Allergies  Allergen Reactions  . Diflunisal      REACTION: rash , swelling  anaphlaxsis  . Lipitor [Atorvastatin]     Pt had body joint pain  . Statins     Myalgia    . Tetanus Toxoid     REACTION: arm swelling, fever    Current Outpatient Medications on File Prior to Visit  Medication Sig Dispense Refill  . ALPRAZolam (XANAX) 1 MG tablet TAKE 1 TABLET BY MOUTH AT BEDTIME AS NEEDED 30 tablet 0  . atenolol (TENORMIN) 50 MG tablet Take 1 tablet by mouth once daily 90 tablet 0  . clopidogrel (PLAVIX) 75 MG tablet Take 1 tablet (75 mg total) by mouth daily. 90 tablet 3  . dexlansoprazole (DEXILANT) 60 MG capsule Take 1 capsule (60 mg total) by mouth daily. 90 capsule 2  . fenofibrate (TRICOR) 145 MG tablet Take 1 tablet (145 mg total) by mouth daily. 90 tablet 3  . fluticasone (FLONASE) 50 MCG/ACT nasal spray Place 2 sprays into both nostrils daily. 16 mL 0  . hydrochlorothiazide (HYDRODIURIL) 25 MG tablet Take 1 tablet (25 mg total) by mouth daily. 30 tablet 1  . MELATONIN PO Take 1 tablet by mouth at bedtime as needed (for sleep).     . nitroGLYCERIN (NITROSTAT) 0.4 MG SL tablet PLACE ONE TABLET UNDER THE TONGUE EVERY 5 MINUTES AS NEEDED. 100 tablet 0   No current facility-administered medications on file prior to visit.    BP 124/80   Temp (!) 97.3 F (36.3 C)   Wt 179 lb (81.2 kg)   BMI 28.04 kg/m       Objective:   Physical Exam Vitals and nursing note reviewed.  Constitutional:      Appearance: Normal appearance.  Abdominal:     General: Abdomen is flat. Bowel sounds are normal.     Palpations: Abdomen is soft.     Tenderness: There is no right CVA tenderness or left CVA tenderness.  Skin:    General: Skin is warm and dry.     Capillary Refill: Capillary refill takes less than 2 seconds.  Neurological:     Mental Status: She is alert.  Psychiatric:        Mood and Affect: Mood normal.        Behavior: Behavior normal.        Thought Content: Thought content normal.        Judgment: Judgment normal.  Assessment & Plan:  1. Dysuria - Possible UTI. Urine Dip negative. Advised against taking other peoples medications  - POCT Urinalysis Dipstick (Automated)  Dorothyann Peng, NP

## 2019-11-06 ENCOUNTER — Other Ambulatory Visit: Payer: Self-pay | Admitting: Adult Health

## 2019-11-27 ENCOUNTER — Other Ambulatory Visit: Payer: Self-pay

## 2019-11-27 ENCOUNTER — Ambulatory Visit (INDEPENDENT_AMBULATORY_CARE_PROVIDER_SITE_OTHER): Payer: Medicare Other

## 2019-11-27 DIAGNOSIS — Z1231 Encounter for screening mammogram for malignant neoplasm of breast: Secondary | ICD-10-CM | POA: Diagnosis not present

## 2019-11-27 DIAGNOSIS — Z0000 Encounter for general adult medical examination without abnormal findings: Secondary | ICD-10-CM

## 2019-11-27 DIAGNOSIS — Z780 Asymptomatic menopausal state: Secondary | ICD-10-CM

## 2019-11-27 NOTE — Patient Instructions (Signed)
Shelley Coleman , Thank you for taking time to come for your Medicare Wellness Visit. I appreciate your ongoing commitment to your health goals. Please review the following plan we discussed and let me know if I can assist you in the future.   Screening recommendations/referrals: Colonoscopy: No longer required  Mammogram: Currently due, orders placed this visit  Bone Density:  Currently due orders placed this visit  Recommended yearly ophthalmology/optometry visit for glaucoma screening and checkup Recommended yearly dental visit for hygiene and checkup  Vaccinations: Influenza vaccine: up to date, next due 11/2019 Pneumococcal vaccine: Completed series  Tdap vaccine: Currently due, may await and injury or contact your insurance to discuss cost  Shingles vaccine: Currently due, please contact your pharmacy to discuss cost and to receive     Advanced directives: Copy on file   Conditions/risks identified: None   Next appointment: None    Preventive Care 65 Years and Older, Female Preventive care refers to lifestyle choices and visits with your health care provider that can promote health and wellness. What does preventive care include?  A yearly physical exam. This is also called an annual well check.  Dental exams once or twice a year.  Routine eye exams. Ask your health care provider how often you should have your eyes checked.  Personal lifestyle choices, including:  Daily care of your teeth and gums.  Regular physical activity.  Eating a healthy diet.  Avoiding tobacco and drug use.  Limiting alcohol use.  Practicing safe sex.  Taking low-dose aspirin every day.  Taking vitamin and mineral supplements as recommended by your health care provider. What happens during an annual well check? The services and screenings done by your health care provider during your annual well check will depend on your age, overall health, lifestyle risk factors, and family history of  disease. Counseling  Your health care provider may ask you questions about your:  Alcohol use.  Tobacco use.  Drug use.  Emotional well-being.  Home and relationship well-being.  Sexual activity.  Eating habits.  History of falls.  Memory and ability to understand (cognition).  Work and work Statistician.  Reproductive health. Screening  You may have the following tests or measurements:  Height, weight, and BMI.  Blood pressure.  Lipid and cholesterol levels. These may be checked every 5 years, or more frequently if you are over 35 years old.  Skin check.  Lung cancer screening. You may have this screening every year starting at age 77 if you have a 30-pack-year history of smoking and currently smoke or have quit within the past 15 years.  Fecal occult blood test (FOBT) of the stool. You may have this test every year starting at age 777.  Flexible sigmoidoscopy or colonoscopy. You may have a sigmoidoscopy every 5 years or a colonoscopy every 10 years starting at age 77.  Hepatitis C blood test.  Hepatitis B blood test.  Sexually transmitted disease (STD) testing.  Diabetes screening. This is done by checking your blood sugar (glucose) after you have not eaten for a while (fasting). You may have this done every 1-3 years.  Bone density scan. This is done to screen for osteoporosis. You may have this done starting at age 777.  Mammogram. This may be done every 1-2 years. Talk to your health care provider about how often you should have regular mammograms. Talk with your health care provider about your test results, treatment options, and if necessary, the need for more tests. Vaccines  Your health care provider may recommend certain vaccines, such as:  Influenza vaccine. This is recommended every year.  Tetanus, diphtheria, and acellular pertussis (Tdap, Td) vaccine. You may need a Td booster every 10 years.  Zoster vaccine. You may need this after age  77.  Pneumococcal 13-valent conjugate (PCV13) vaccine. One dose is recommended after age 19.  Pneumococcal polysaccharide (PPSV23) vaccine. One dose is recommended after age 2. Talk to your health care provider about which screenings and vaccines you need and how often you need them. This information is not intended to replace advice given to you by your health care provider. Make sure you discuss any questions you have with your health care provider. Document Released: 05/14/2015 Document Revised: 01/05/2016 Document Reviewed: 02/16/2015 Elsevier Interactive Patient Education  2017 [place] Prevention in the Home Falls can cause injuries. They can happen to people of all ages. There are many things you can do to make your home safe and to help prevent falls. What can I do on the outside of my home?  Regularly fix the edges of walkways and driveways and fix any cracks.  Remove anything that might make you trip as you walk through a door, such as a raised step or threshold.  Trim any bushes or trees on the path to your home.  Use bright outdoor lighting.  Clear any walking paths of anything that might make someone trip, such as rocks or tools.  Regularly check to see if handrails are loose or broken. Make sure that both sides of any steps have handrails.  Any raised decks and porches should have guardrails on the edges.  Have any leaves, snow, or ice cleared regularly.  Use sand or salt on walking paths during winter.  Clean up any spills in your garage right away. This includes oil or grease spills. What can I do in the bathroom?  Use night lights.  Install grab bars by the toilet and in the tub and shower. Do not use towel bars as grab bars.  Use non-skid mats or decals in the tub or shower.  If you need to sit down in the shower, use a plastic, non-slip stool.  Keep the floor dry. Clean up any water that spills on the floor as soon as it happens.  Remove  soap buildup in the tub or shower regularly.  Attach bath mats securely with double-sided non-slip rug tape.  Do not have throw rugs and other things on the floor that can make you trip. What can I do in the bedroom?  Use night lights.  Make sure that you have a light by your bed that is easy to reach.  Do not use any sheets or blankets that are too big for your bed. They should not hang down onto the floor.  Have a firm chair that has side arms. You can use this for support while you get dressed.  Do not have throw rugs and other things on the floor that can make you trip. What can I do in the kitchen?  Clean up any spills right away.  Avoid walking on wet floors.  Keep items that you use a lot in easy-to-reach places.  If you need to reach something above you, use a strong step stool that has a grab bar.  Keep electrical cords out of the way.  Do not use floor polish or wax that makes floors slippery. If you must use wax, use non-skid floor wax.  Do  not have throw rugs and other things on the floor that can make you trip. What can I do with my stairs?  Do not leave any items on the stairs.  Make sure that there are handrails on both sides of the stairs and use them. Fix handrails that are broken or loose. Make sure that handrails are as long as the stairways.  Check any carpeting to make sure that it is firmly attached to the stairs. Fix any carpet that is loose or worn.  Avoid having throw rugs at the top or bottom of the stairs. If you do have throw rugs, attach them to the floor with carpet tape.  Make sure that you have a light switch at the top of the stairs and the bottom of the stairs. If you do not have them, ask someone to add them for you. What else can I do to help prevent falls?  Wear shoes that:  Do not have high heels.  Have rubber bottoms.  Are comfortable and fit you well.  Are closed at the toe. Do not wear sandals.  If you use a  stepladder:  Make sure that it is fully opened. Do not climb a closed stepladder.  Make sure that both sides of the stepladder are locked into place.  Ask someone to hold it for you, if possible.  Clearly mark and make sure that you can see:  Any grab bars or handrails.  First and last steps.  Where the edge of each step is.  Use tools that help you move around (mobility aids) if they are needed. These include:  Canes.  Walkers.  Scooters.  Crutches.  Turn on the lights when you go into a dark area. Replace any light bulbs as soon as they burn out.  Set up your furniture so you have a clear path. Avoid moving your furniture around.  If any of your floors are uneven, fix them.  If there are any pets around you, be aware of where they are.  Review your medicines with your doctor. Some medicines can make you feel dizzy. This can increase your chance of falling. Ask your doctor what other things that you can do to help prevent falls. This information is not intended to replace advice given to you by your health care provider. Make sure you discuss any questions you have with your health care provider. Document Released: 02/11/2009 Document Revised: 09/23/2015 Document Reviewed: 05/22/2014 Elsevier Interactive Patient Education  2017 [employer].

## 2019-11-27 NOTE — Progress Notes (Signed)
Subjective:   Shelley Coleman is a 79 y.o. female who presents for Medicare Annual (Subsequent) preventive examination.  I connected with Shelley Coleman today by telephone and verified that I am speaking with the correct person using two identifiers. Location patient: home Location provider: work Persons participating in the virtual visit: patient, provider.   I discussed the limitations, risks, security and privacy concerns of performing an evaluation and management service by telephone and the availability of in person appointments. I also discussed with the patient that there may be a patient responsible charge related to this service. The patient expressed understanding and verbally consented to this telephonic visit.    Interactive audio and video telecommunications were attempted between this provider and patient, however failed, due to patient having technical difficulties OR patient did not have access to video capability.  We continued and completed visit with audio only.      Review of Systems    N/A Cardiac Risk Factors include: advanced age (>59men, >44 women);dyslipidemia;hypertension     Objective:    Today's Vitals   There is no height or weight on file to calculate BMI.  Advanced Directives 11/27/2019 11/15/2016 04/27/2016 04/27/2016 11/02/2015 11/04/2014  Does Patient Have a Medical Advance Directive? No;Yes No;Yes Yes Yes Yes Yes  Type of Paramedic of [place];Living will [place];Living will [place];Living will  Does patient want to make changes to medical advance directive? No - Patient declined No - Patient declined - - No - Patient declined -  Copy of [place] in Chart? No - copy requested No - copy requested - - No - copy requested No - copy requested  Would patient like information on creating a medical advance directive? - No - Patient declined - - -  -    Current Medications (verified) Outpatient Encounter Medications as of 11/27/2019  Medication Sig  . ALPRAZolam (XANAX) 1 MG tablet TAKE 1 TABLET BY MOUTH AT BEDTIME AS NEEDED  . atenolol (TENORMIN) 50 MG tablet Take 1 tablet by mouth once daily  . clopidogrel (PLAVIX) 75 MG tablet Take 1 tablet (75 mg total) by mouth daily.  Marland Kitchen dexlansoprazole (DEXILANT) 60 MG capsule Take 1 capsule (60 mg total) by mouth daily.  . fluticasone (FLONASE) 50 MCG/ACT nasal spray Place 2 sprays into both nostrils daily.  Marland Kitchen MELATONIN PO Take 1 tablet by mouth at bedtime as needed (for sleep).   . fenofibrate (TRICOR) 145 MG tablet Take 1 tablet (145 mg total) by mouth daily. (Patient not taking: Reported on 11/27/2019)  . hydrochlorothiazide (HYDRODIURIL) 25 MG tablet Take 1 tablet (25 mg total) by mouth daily. (Patient not taking: Reported on 11/27/2019)  . nitroGLYCERIN (NITROSTAT) 0.4 MG SL tablet PLACE ONE TABLET UNDER THE TONGUE EVERY 5 MINUTES AS NEEDED. (Patient not taking: Reported on 11/27/2019)   No facility-administered encounter medications on file as of 11/27/2019.    Allergies (verified) Diflunisal, Lipitor [atorvastatin], Statins, and Tetanus toxoid   History: Past Medical History:  Diagnosis Date  . Anemia    Has had iron infusions  . Breast cancer ([place])    right  . Cerebrovascular disease 11/12/2018  . Depression   . Depression   . GERD (gastroesophageal reflux disease)   . Hiatal hernia   . History of GI bleed   . Hx of mitral valve prolapse   . Hyperlipidemia   . Irregular heart beat   .  Neuropathy    numbness in both feet  . Nocturnal leg cramps 09/02/2015  . Optic neuritis    left  . Osteopenia   . Ovarian cyst   . Parotid tumor    right  . Pelvic fracture ([place])   . Restless legs syndrome   . Shingles 2000   Left in V1  . Stroke [facility])    Past Surgical History:  Procedure Laterality Date  . ABDOMINAL HYSTERECTOMY    . APPENDECTOMY    . BREAST LUMPECTOMY Left   .  CATARACT EXTRACTION    . ESOPHAGEAL MANOMETRY N/A 01/13/2013   Procedure: ESOPHAGEAL MANOMETRY (EM);  Surgeon: Sable Feil, MD;  Location: [facility];  Service: Endoscopy;  Laterality: N/A;  . HERNIA REPAIR  08/2009  . MASTECTOMY Right   . SPINE SURGERY    . TOTAL HIP ARTHROPLASTY Left    Family History  Problem Relation Age of Onset  . Cancer Mother        Angio sarcoma   . Heart disease Father   . Hypertension Father   . Stroke Father        x2  . Heart attack Father        x 11  . Hernia Brother   . Hypertension Brother    Social History   Socioeconomic History  . Marital status: Married    Spouse name: Not on file  . Number of children: 2  . Years of education: 46  . Highest education level: Not on file  Occupational History  . Occupation: Retired Therapist, sports  Tobacco Use  . Smoking status: Never Smoker  . Smokeless tobacco: Never Used  Substance and Sexual Activity  . Alcohol use: Yes    Alcohol/week: 1.0 standard drink    Types: 1 Glasses of wine per week    Comment: Rarely,socially  . Drug use: No  . Sexual activity: Not on file  Other Topics Concern  . Not on file  Social History Narrative   Retired from nursing    Widowed    Left-handed      Social Determinants of Health   Financial Resource Strain: [place]   . Difficulty of Paying Living Expenses: Not hard at all  Food Insecurity: No Food Insecurity  . Worried About Charity fundraiser in the Last Year: Never true  . Ran Out of Food in the Last Year: Never true  Transportation Needs: No Transportation Needs  . Lack of Transportation (Medical): No  . Lack of Transportation (Non-Medical): No  Physical Activity: Inactive  . Days of Exercise per Week: 0 days  . Minutes of Exercise per Session: 0 min  Stress: No Stress Concern Present  . Feeling of Stress : Not at all  Social Connections: Moderately Isolated  . Frequency of Communication with Friends and Family: More than three times a week  .  Frequency of Social Gatherings with Friends and Family: More than three times a week  . Attends Religious Services: More than 4 times per year  . Active Member of Clubs or Organizations: No  . Attends Archivist Meetings: Never  . Marital Status: Widowed    Tobacco Counseling Counseling given: Not Answered   Clinical Intake:  Pre-visit preparation completed: Yes  Pain : No/denies pain     Nutritional Risks: None Diabetes: No  How often do you need to have someone help you when you read instructions, pamphlets, or other written materials from your doctor or pharmacy?: 1 -  Never What is the last grade level you completed in school?: Associates Degree  Diabetic?No  Interpreter Needed?: No  Information entered by :: [place] of Daily Living In your present state of health, do you have any difficulty performing the following activities: 11/27/2019  Hearing? Y  Comment Has bilateral hearing aids  Vision? N  Difficulty concentrating or making decisions? N  Walking or climbing stairs? N  Dressing or bathing? N  Doing errands, shopping? N  Preparing Food and eating ? N  Using the Toilet? N  In the past six months, have you accidently leaked urine? Y  Comment Has occassional urge incontience, wears pads occassionally  Do you have problems with loss of bowel control? N  Managing your Medications? N  Managing your Finances? N  Housekeeping or managing your Housekeeping? N  Some recent data might be hidden    Patient Care Team: Dorothyann Peng, NP as PCP - General (Family Medicine) Kathie Rhodes, MD as Consulting Physician (Urology)  Indicate any recent Medical Services you may have received from other than [facility] providers in the past year (date may be approximate).     Assessment:   This is a routine wellness examination for [place].  Hearing/Vision screen  Hearing Screening   125Hz  250Hz  500Hz  1000Hz  2000Hz  3000Hz  4000Hz  6000Hz  8000Hz   Right  ear:           Left ear:           Vision Screening Comments: Patient states does not get her checked.   Dietary issues and exercise activities discussed: Current Exercise Habits: The patient does not participate in regular exercise at present, Exercise limited by: neurologic condition(s)  Goals    . Decrease the likelihood of falling    . Exercise 3x per week (30 min per time)    . patient     Continue to recover from stroke and regain independence     . Patient Stated     I will continue to walk a mile daily with my dog      Depression Screen PHQ 2/9 Scores 11/27/2019 02/05/2017 04/27/2016 02/17/2015 02/04/2015 09/14/2014 06/03/2014  PHQ - 2 Score 1 2 1 4 6 2 1   PHQ- 9 Score 2 6 4 14 18 13  -    Fall Risk Fall Risk  11/27/2019 03/21/2018 02/05/2017 09/13/2016 04/27/2016  Falls in the past year? 0 1 Yes No Yes  Comment - Emmi Telephone Survey: data to providers prior to load - - -  Number falls in past yr: 0 1 2 or more - -  Comment - Emmi Telephone Survey Actual Response = 2 - - -  Injury with Fall? 0 1 Yes - -  Comment - - - - -  Risk Factor Category  - - High Fall Risk - -  Risk for fall due to : Medication side effect - Impaired balance/gait;Impaired mobility - -  Risk for fall due to: Comment - - - - -  Follow up Falls evaluation completed;Falls prevention discussed - Falls evaluation completed;Education provided;Falls prevention discussed - -    Any stairs in or around the home? No  If so, are there any without handrails? No  Home free of loose throw rugs in walkways, pet beds, electrical cords, etc? Yes  Adequate lighting in your home to reduce risk of falls? Yes   ASSISTIVE DEVICES UTILIZED TO PREVENT FALLS:  Life alert? No  Use of a cane, walker or w/c? No  Grab bars  in the bathroom? Yes  Shower chair or bench in shower? Yes  Elevated toilet seat or a handicapped toilet? Yes     Cognitive Function: MMSE - Mini Mental State Exam 04/27/2016 04/27/2016 09/02/2015    Not completed: (No Data) (No Data) -  Orientation to time - - 5  Orientation to Place - - 5  Registration - - 3  Attention/ Calculation - - 5  Recall - - 2  Language- name 2 objects - - 2  Language- repeat - - 1  Language- follow 3 step command - - 3  Language- read & follow direction - - 1  Write a sentence - - 1  Copy design - - 1  Total score - - 29     6CIT Screen 11/27/2019  What Year? 0 points  What month? 0 points  What time? 0 points  Count back from 20 0 points  Months in reverse 0 points  Repeat phrase 2 points  Total Score 2    Immunizations Immunization History  Administered Date(s) Administered  . Influenza Split 01/11/2012  . Influenza Whole 01/22/2009  . Influenza, High Dose Seasonal PF 04/06/2014, 01/25/2015, 02/22/2016, 02/04/2017, 01/14/2019  . Influenza,inj,Quad PF,6+ Mos 01/22/2013  . Influenza,trivalent, recombinat, inj, PF 03/31/2011  . Influenza-Unspecified 03/01/2018  . PFIZER SARS-COV-2 Vaccination 06/05/2019, 07/01/2019  . Pneumococcal Conjugate-13 01/25/2015  . Pneumococcal Polysaccharide-23 09/16/2007  . Pneumococcal-Unspecified 01/14/2019  . Td 05/01/2006  . Zoster 09/16/2007    TDAP status: Due, Education has been provided regarding the importance of this vaccine. Advised may receive this vaccine at local pharmacy or Health Dept. Aware to provide a copy of the vaccination record if obtained from local pharmacy or Health Dept. Verbalized acceptance and understanding. Flu Vaccine status: Up to date Pneumococcal vaccine status: Up to date Covid-19 vaccine status: Completed vaccines  Qualifies for Shingles Vaccine? Yes   Zostavax completed Yes   Shingrix Completed?: No.    Education has been provided regarding the importance of this vaccine. Patient has been advised to call insurance company to determine out of pocket expense if they have not yet received this vaccine. Advised may also receive vaccine at local pharmacy or Health Dept.  Verbalized acceptance and understanding.  Screening Tests Health Maintenance  Topic Date Due  . Hepatitis C Screening  Never done  . DEXA SCAN  Never done  . TETANUS/TDAP  05/01/2016  . INFLUENZA VACCINE  11/30/2019  . COVID-19 Vaccine  Completed  . PNA vac Low Risk Adult  Completed    Health Maintenance  Health Maintenance Due  Topic Date Due  . Hepatitis C Screening  Never done  . DEXA SCAN  Never done  . TETANUS/TDAP  05/01/2016    Colorectal cancer screening: No longer required.  Mammogram status: Ordered 01/26/2017. Pt provided with contact info and advised to call to schedule appt.  Bone Density status: Ordered 11/27/2019. Pt provided with contact info and advised to call to schedule appt.  Lung Cancer Screening: (Low Dose CT Chest recommended if Age 21-80 years, 30 pack-year currently smoking OR have quit w/in 15years.) does not qualify.   Lung Cancer Screening Referral: N/A  Additional Screening:  Hepatitis C Screening: does not qualify;   Vision Screening: Recommended annual ophthalmology exams for early detection of glaucoma and other disorders of the eye. Is the patient up to date with their annual eye exam?  No  Who is the provider or what is the name of the office in which the patient attends  annual eye exams? Does not have an eye docotor If pt is not established with a provider, would they like to be referred to a provider to establish care? No .   Dental Screening: Recommended annual dental exams for proper oral hygiene  Community Resource Referral / Chronic Care Management: CRR required this visit?  No   CCM required this visit?  No      Plan:     I have personally reviewed and noted the following in the patient's chart:   . Medical and social history . Use of alcohol, tobacco or illicit drugs  . Current medications and supplements . Functional ability and status . Nutritional status . Physical activity . Advanced directives . List of other  physicians . Hospitalizations, surgeries, and ER visits in previous 12 months . Vitals . Screenings to include cognitive, depression, and falls . Referrals and appointments  In addition, I have reviewed and discussed with patient certain preventive protocols, quality metrics, and best practice recommendations. A written personalized care plan for preventive services as well as general preventive health recommendations were provided to patient.     Ofilia Neas, LPN   8/40/3754   Nurse Notes: None

## 2019-11-28 ENCOUNTER — Other Ambulatory Visit: Payer: Self-pay | Admitting: Adult Health

## 2019-11-28 DIAGNOSIS — Z780 Asymptomatic menopausal state: Secondary | ICD-10-CM

## 2020-01-09 ENCOUNTER — Other Ambulatory Visit: Payer: Self-pay | Admitting: Adult Health

## 2020-02-27 ENCOUNTER — Other Ambulatory Visit: Payer: Self-pay

## 2020-02-27 ENCOUNTER — Ambulatory Visit
Admission: RE | Admit: 2020-02-27 | Discharge: 2020-02-27 | Disposition: A | Discharge status: Home / Self Care | Payer: Medicare Other | Source: Ambulatory Visit | Attending: Adult Health | Admitting: Adult Health

## 2020-02-27 ENCOUNTER — Other Ambulatory Visit: Payer: Self-pay | Admitting: Adult Health

## 2020-02-27 DIAGNOSIS — Z1231 Encounter for screening mammogram for malignant neoplasm of breast: Secondary | ICD-10-CM | POA: Diagnosis not present

## 2020-02-27 DIAGNOSIS — Z780 Asymptomatic menopausal state: Secondary | ICD-10-CM | POA: Diagnosis not present

## 2020-02-27 DIAGNOSIS — M85851 Other specified disorders of bone density and structure, right thigh: Secondary | ICD-10-CM | POA: Diagnosis not present

## 2020-02-27 DIAGNOSIS — M810 Age-related osteoporosis without current pathological fracture: Secondary | ICD-10-CM | POA: Diagnosis not present

## 2020-03-04 ENCOUNTER — Telehealth: Payer: Self-pay | Admitting: Adult Health

## 2020-03-04 ENCOUNTER — Other Ambulatory Visit: Payer: Self-pay | Admitting: Neurology

## 2020-03-04 ENCOUNTER — Other Ambulatory Visit: Payer: Self-pay | Admitting: Adult Health

## 2020-03-04 NOTE — Telephone Encounter (Signed)
Pt call and stated she have taken her last clopidogrel (PLAVIX) 75 MG tablet and want a refill sent to  [place], [place] - [address] Phone:  [phone]  Fax:  [phone]     and hope she can get it to day.

## 2020-03-05 DIAGNOSIS — Z23 Encounter for immunization: Secondary | ICD-10-CM | POA: Diagnosis not present

## 2020-03-09 ENCOUNTER — Telehealth: Payer: Self-pay | Admitting: *Deleted

## 2020-03-09 NOTE — Telephone Encounter (Signed)
Insurance verification started for Prolia 

## 2020-03-11 NOTE — Telephone Encounter (Signed)
20% of prolia = $230 admin fee = $35 After deductible is met.  Patient is aware.  She will check with her insurance and [facility] and call back if she would like to schedule a Prolia injection.

## 2020-03-24 ENCOUNTER — Other Ambulatory Visit: Payer: Self-pay

## 2020-03-24 ENCOUNTER — Telehealth: Payer: Self-pay | Admitting: Adult Health

## 2020-03-24 MED ORDER — DEXILANT 60 MG PO CPDR
1.0000 | DELAYED_RELEASE_CAPSULE | Freq: Every day | ORAL | 2 refills | Status: DC
Start: 2020-03-24 — End: 2020-09-23

## 2020-03-24 NOTE — Telephone Encounter (Signed)
dexlansoprazole (DEXILANT) 60 MG capsule  [place], [place] [address] Phone:  [phone]  Fax:  [phone]     She really needs sent in today. She also needs a 90 day supply

## 2020-03-24 NOTE — Telephone Encounter (Signed)
Pt Rx sent as requested 

## 2020-04-06 ENCOUNTER — Other Ambulatory Visit: Payer: Self-pay | Admitting: Adult Health

## 2020-04-21 DIAGNOSIS — R300 Dysuria: Secondary | ICD-10-CM | POA: Diagnosis not present

## 2020-04-21 DIAGNOSIS — N12 Tubulo-interstitial nephritis, not specified as acute or chronic: Secondary | ICD-10-CM | POA: Diagnosis not present

## 2020-04-21 DIAGNOSIS — R350 Frequency of micturition: Secondary | ICD-10-CM | POA: Diagnosis not present

## 2020-04-22 ENCOUNTER — Ambulatory Visit (INDEPENDENT_AMBULATORY_CARE_PROVIDER_SITE_OTHER): Payer: Medicare Other | Admitting: Neurology

## 2020-04-22 ENCOUNTER — Encounter: Payer: Self-pay | Admitting: Neurology

## 2020-04-22 VITALS — BP 119/73 | HR 66 | Ht 67.0 in | Wt 181.5 lb

## 2020-04-22 DIAGNOSIS — G609 Hereditary and idiopathic neuropathy, unspecified: Secondary | ICD-10-CM

## 2020-04-22 DIAGNOSIS — G2581 Restless legs syndrome: Secondary | ICD-10-CM | POA: Diagnosis not present

## 2020-04-22 DIAGNOSIS — M545 Low back pain, unspecified: Secondary | ICD-10-CM | POA: Diagnosis not present

## 2020-04-22 DIAGNOSIS — G8929 Other chronic pain: Secondary | ICD-10-CM | POA: Diagnosis not present

## 2020-04-22 MED ORDER — CLOPIDOGREL BISULFATE 75 MG PO TABS: 75.0000 mg | ORAL_TABLET | Freq: Every day | ORAL | 3 refills | Status: AC

## 2020-04-22 NOTE — Patient Instructions (Signed)
He may try alpha lipoic acid taking 600 mg daily, and use capsicin cream 3 times daily for the neuropathy.

## 2020-04-22 NOTE — Progress Notes (Signed)
Reason for visit: Peripheral neuropathy  Shelley Coleman is an 79 y.o. female  History of present illness:  Ms. Deibel is a 79 year old right-handed white female with a history of a peripheral neuropathy and some low back pain.  The patient also suffers from restless leg syndrome.  She has had a sensitive stomach, she has gone off many of her medications because of this.  She takes 1 to 2 mg of Requip in the evening, if she takes more than this she has a hangover feeling the next morning.  She is not sleeping well, she has fatigue during the day.  She has started vitamin B12.  She has burning in the feet at night.  In the past, she got no benefit from gabapentin, Lyrica, or Cymbalta.  She takes melatonin at night.  She tries to walk 1 to 1/2 miles daily, she does have some shortness of breath and she goes up hills.  She returns to this office for an evaluation.  Past Medical History:  Diagnosis Date  . Anemia    Has had iron infusions  . Breast cancer ([place])    right  . Cerebrovascular disease 11/12/2018  . Depression   . Depression   . GERD (gastroesophageal reflux disease)   . Hiatal hernia   . History of GI bleed   . Hx of mitral valve prolapse   . Hyperlipidemia   . Irregular heart beat   . Neuropathy    numbness in both feet  . Nocturnal leg cramps 09/02/2015  . Optic neuritis    left  . Osteopenia   . Ovarian cyst   . Parotid tumor    right  . Pelvic fracture ([place])   . Restless legs syndrome   . Shingles 2000   Left in V1  . Stroke [facility])     Past Surgical History:  Procedure Laterality Date  . ABDOMINAL HYSTERECTOMY    . APPENDECTOMY    . BREAST LUMPECTOMY Left   . CATARACT EXTRACTION    . ESOPHAGEAL MANOMETRY N/A 01/13/2013   Procedure: ESOPHAGEAL MANOMETRY (EM);  Surgeon: Sable Feil, MD;  Location: [facility];  Service: Endoscopy;  Laterality: N/A;  . HERNIA REPAIR  08/2009  . MASTECTOMY Right   . SPINE SURGERY    . TOTAL HIP ARTHROPLASTY Left      Family History  Problem Relation Age of Onset  . Cancer Mother        Angio sarcoma   . Heart disease Father   . Hypertension Father   . Stroke Father        x2  . Heart attack Father        x 11  . Hernia Brother   . Hypertension Brother     Social history:  reports that she has never smoked. She has never used smokeless tobacco. She reports current alcohol use of about 1.0 standard drink of alcohol per week. She reports that she does not use drugs.    Allergies  Allergen Reactions  . Diflunisal     REACTION: rash , swelling  anaphlaxsis  . Lipitor [Atorvastatin]     Pt had body joint pain  . Statins     Myalgia    . Tetanus Toxoid     REACTION: arm swelling, fever    Medications:  Prior to Admission medications   Medication Sig Start Date End Date Taking? Authorizing Provider  ALPRAZolam (XANAX) 1 MG tablet TAKE 1 TABLET BY MOUTH AT  BEDTIME AS NEEDED 03/04/20  Yes Nafziger, Tommi Rumps, NP  atenolol (TENORMIN) 50 MG tablet Take 1 tablet by mouth once daily 04/06/20  Yes Nafziger, Tommi Rumps, NP  ciprofloxacin (CIPRO) 500 MG tablet Take 500 mg by mouth 2 (two) times daily. Prescribed on 04/21/20 for seven days - UTI.   Yes [provider]  clopidogrel (PLAVIX) 75 MG tablet Take 1 tablet (75 mg total) by mouth daily. Must keep 12/23 appt for further refills 03/04/20 05/03/20 Yes Kathrynn Ducking, MD  dexlansoprazole (DEXILANT) 60 MG capsule Take 1 capsule (60 mg total) by mouth daily. 03/24/20  Yes Nafziger, Tommi Rumps, NP  fluticasone (FLONASE) 50 MCG/ACT nasal spray Place 2 sprays into both nostrils daily. 02/27/19  Yes Nafziger, Tommi Rumps, NP  hydrochlorothiazide (HYDRODIURIL) 25 MG tablet Take 1 tablet (25 mg total) by mouth daily. 04/28/16  Yes Nafziger, Tommi Rumps, NP  MELATONIN PO Take 1 tablet by mouth at bedtime as needed (for sleep).    Yes [provider]  nitroGLYCERIN (NITROSTAT) 0.4 MG SL tablet PLACE ONE TABLET UNDER THE TONGUE EVERY 5 MINUTES AS NEEDED. 10/10/16  Yes  Nafziger, Tommi Rumps, NP  rOPINIRole (REQUIP) 1 MG tablet Take 1-2 mg by mouth as needed (RLS).   Yes [provider]    ROS:  Out of a complete 14 system review of symptoms, the patient complains only of the following symptoms, and all other reviewed systems are negative.  Burning in the feet Insomnia Restless legs  Blood pressure 119/73, pulse 66, height 5\' 7"  (1.702 m), weight 181 lb 8 oz (82.3 kg).  Physical Exam  General: The patient is alert and cooperative at the time of the examination.  Skin: No significant peripheral edema is noted.   Neurologic Exam  Mental status: The patient is alert and oriented x 3 at the time of the examination. The patient has apparent normal recent and remote memory, with an apparently normal attention span and concentration ability.   Cranial nerves: Facial symmetry is present. Speech is normal, no aphasia or dysarthria is noted. Extraocular movements are full. Visual fields are full.  Motor: The patient has good strength in all 4 extremities.  Sensory examination: Soft touch sensation is symmetric on the face, arms, and legs.  Coordination: The patient has good finger-nose-finger and heel-to-shin bilaterally.  Gait and station: The patient has a normal gait. Tandem gait is normal. Romberg is negative. No drift is seen.  The patient is able to walk on the heels and the toes bilaterally.  Reflexes: Deep tendon reflexes are symmetric, but are depressed.   Assessment/Plan:  1.  Peripheral neuropathy  2.  Restless leg syndrome  3.  History of cerebrovascular disease  4.  Chronic low back pain  The patient will be given a prescription for her Plavix.  She has not tolerated oral medications well, we could potentially use Dilantin or carbamazepine or even amitriptyline in the future if desired.  The patient will give a trial on capsicin cream and then go on alpha lipoic acid, 600 mg daily.  She will follow up here in 6 months.  Jill Alexanders MD 04/22/2020 7:29 AM  [facility] [address] [place], [place] [address]  Phone [phone] Fax [phone]

## 2020-04-28 DIAGNOSIS — R5383 Other fatigue: Secondary | ICD-10-CM | POA: Diagnosis not present

## 2020-04-28 DIAGNOSIS — R067 Sneezing: Secondary | ICD-10-CM | POA: Diagnosis not present

## 2020-04-28 DIAGNOSIS — R432 Parageusia: Secondary | ICD-10-CM | POA: Diagnosis not present

## 2020-04-28 DIAGNOSIS — Z20822 Contact with and (suspected) exposure to covid-19: Secondary | ICD-10-CM | POA: Diagnosis not present

## 2020-04-28 DIAGNOSIS — R0981 Nasal congestion: Secondary | ICD-10-CM | POA: Diagnosis not present

## 2020-04-29 ENCOUNTER — Telehealth: Payer: Medicare Other | Admitting: Adult Health

## 2020-05-13 DIAGNOSIS — H353221 Exudative age-related macular degeneration, left eye, with active choroidal neovascularization: Secondary | ICD-10-CM | POA: Diagnosis not present

## 2020-05-13 DIAGNOSIS — H353112 Nonexudative age-related macular degeneration, right eye, intermediate dry stage: Secondary | ICD-10-CM | POA: Diagnosis not present

## 2020-05-13 DIAGNOSIS — H43813 Vitreous degeneration, bilateral: Secondary | ICD-10-CM | POA: Diagnosis not present

## 2020-05-13 DIAGNOSIS — Z961 Presence of intraocular lens: Secondary | ICD-10-CM | POA: Diagnosis not present

## 2020-05-26 NOTE — Telephone Encounter (Signed)
In the past this was filled by Dr. Jannifer Franklin.  Are you taking over this medication?

## 2020-05-26 NOTE — Telephone Encounter (Signed)
Dr. Jannifer Franklin sent in a year supply in December

## 2020-05-26 NOTE — Telephone Encounter (Signed)
Spoke to the pt.  She has refills from Dr. Jannifer Franklin.  Nothing further needed.

## 2020-06-17 DIAGNOSIS — H353221 Exudative age-related macular degeneration, left eye, with active choroidal neovascularization: Secondary | ICD-10-CM | POA: Diagnosis not present

## 2020-06-17 DIAGNOSIS — H353112 Nonexudative age-related macular degeneration, right eye, intermediate dry stage: Secondary | ICD-10-CM | POA: Diagnosis not present

## 2020-06-17 DIAGNOSIS — H43813 Vitreous degeneration, bilateral: Secondary | ICD-10-CM | POA: Diagnosis not present

## 2020-07-08 ENCOUNTER — Other Ambulatory Visit: Payer: Self-pay | Admitting: Adult Health

## 2020-07-29 DIAGNOSIS — H353221 Exudative age-related macular degeneration, left eye, with active choroidal neovascularization: Secondary | ICD-10-CM | POA: Diagnosis not present

## 2020-07-29 DIAGNOSIS — H43813 Vitreous degeneration, bilateral: Secondary | ICD-10-CM | POA: Diagnosis not present

## 2020-07-29 DIAGNOSIS — H353112 Nonexudative age-related macular degeneration, right eye, intermediate dry stage: Secondary | ICD-10-CM | POA: Diagnosis not present

## 2020-09-16 DIAGNOSIS — Z961 Presence of intraocular lens: Secondary | ICD-10-CM | POA: Diagnosis not present

## 2020-09-16 DIAGNOSIS — H43813 Vitreous degeneration, bilateral: Secondary | ICD-10-CM | POA: Diagnosis not present

## 2020-09-16 DIAGNOSIS — H353221 Exudative age-related macular degeneration, left eye, with active choroidal neovascularization: Secondary | ICD-10-CM | POA: Diagnosis not present

## 2020-09-16 DIAGNOSIS — H353112 Nonexudative age-related macular degeneration, right eye, intermediate dry stage: Secondary | ICD-10-CM | POA: Diagnosis not present

## 2020-09-23 ENCOUNTER — Telehealth: Payer: Self-pay | Admitting: Adult Health

## 2020-09-23 ENCOUNTER — Other Ambulatory Visit: Payer: Self-pay | Admitting: Adult Health

## 2020-09-23 MED ORDER — DEXLANSOPRAZOLE 60 MG PO CPDR: 60.0000 mg | DELAYED_RELEASE_CAPSULE | Freq: Every day | ORAL | 0 refills | Status: DC

## 2020-09-23 NOTE — Telephone Encounter (Signed)
Pt is requesting a generic for the  dexlansoprazole (DEXILANT) 60 MG capsule; this is a tier 4 drug. Pt requesting a prior authorization    The pharmacy is suggesting a  tier 3 drug. pt wants this expedited so she can get it  [place], [place] - [place] [address]Fax:  [phone]

## 2020-09-23 NOTE — Telephone Encounter (Signed)
Pt stated there is a generic for Dexilant it is dexlansoprazole. She stated she spoke with her insurance company and this is what they told her. Pt stated she has tried Nexium, Protonic, omeprazole w/o relief.

## 2020-09-24 NOTE — Telephone Encounter (Signed)
Spoke to pt to see if she was able to receive her medication. Pt stated that she has not received it bc the medication was still expensive. Pt stated she will call the insurance to see if she can get it cheaper Tue.

## 2020-10-15 ENCOUNTER — Other Ambulatory Visit: Payer: Self-pay | Admitting: Adult Health

## 2020-10-21 ENCOUNTER — Ambulatory Visit: Payer: Medicare Other | Admitting: Neurology

## 2020-10-21 ENCOUNTER — Telehealth: Payer: Self-pay | Admitting: Neurology

## 2020-10-21 ENCOUNTER — Encounter: Payer: Self-pay | Admitting: Neurology

## 2020-10-21 NOTE — Telephone Encounter (Signed)
This patient did not show for a revisit today.

## 2020-10-28 ENCOUNTER — Inpatient Hospital Stay (HOSPITAL_COMMUNITY)
Admission: EM | Admit: 2020-10-28 | Discharge: 2020-11-01 | DRG: 377 | Disposition: A | Discharge status: Home / Self Care | Payer: Medicare Other | Attending: Internal Medicine | Admitting: Internal Medicine

## 2020-10-28 ENCOUNTER — Other Ambulatory Visit: Payer: Self-pay

## 2020-10-28 ENCOUNTER — Emergency Department (HOSPITAL_COMMUNITY): Payer: Medicare Other

## 2020-10-28 DIAGNOSIS — Z9011 Acquired absence of right breast and nipple: Secondary | ICD-10-CM

## 2020-10-28 DIAGNOSIS — I5032 Chronic diastolic (congestive) heart failure: Secondary | ICD-10-CM | POA: Diagnosis present

## 2020-10-28 DIAGNOSIS — I679 Cerebrovascular disease, unspecified: Secondary | ICD-10-CM | POA: Diagnosis present

## 2020-10-28 DIAGNOSIS — K760 Fatty (change of) liver, not elsewhere classified: Secondary | ICD-10-CM | POA: Diagnosis not present

## 2020-10-28 DIAGNOSIS — Z7902 Long term (current) use of antithrombotics/antiplatelets: Secondary | ICD-10-CM

## 2020-10-28 DIAGNOSIS — R197 Diarrhea, unspecified: Secondary | ICD-10-CM | POA: Diagnosis not present

## 2020-10-28 DIAGNOSIS — I10 Essential (primary) hypertension: Secondary | ICD-10-CM | POA: Diagnosis present

## 2020-10-28 DIAGNOSIS — Z888 Allergy status to other drugs, medicaments and biological substances status: Secondary | ICD-10-CM

## 2020-10-28 DIAGNOSIS — E785 Hyperlipidemia, unspecified: Secondary | ICD-10-CM | POA: Diagnosis present

## 2020-10-28 DIAGNOSIS — I341 Nonrheumatic mitral (valve) prolapse: Secondary | ICD-10-CM | POA: Diagnosis present

## 2020-10-28 DIAGNOSIS — R55 Syncope and collapse: Secondary | ICD-10-CM

## 2020-10-28 DIAGNOSIS — I951 Orthostatic hypotension: Secondary | ICD-10-CM | POA: Diagnosis present

## 2020-10-28 DIAGNOSIS — B961 Klebsiella pneumoniae [K. pneumoniae] as the cause of diseases classified elsewhere: Secondary | ICD-10-CM | POA: Diagnosis present

## 2020-10-28 DIAGNOSIS — U071 COVID-19: Secondary | ICD-10-CM | POA: Diagnosis present

## 2020-10-28 DIAGNOSIS — D500 Iron deficiency anemia secondary to blood loss (chronic): Secondary | ICD-10-CM

## 2020-10-28 DIAGNOSIS — Z887 Allergy status to serum and vaccine status: Secondary | ICD-10-CM

## 2020-10-28 DIAGNOSIS — Z90710 Acquired absence of both cervix and uterus: Secondary | ICD-10-CM

## 2020-10-28 DIAGNOSIS — E860 Dehydration: Secondary | ICD-10-CM | POA: Diagnosis present

## 2020-10-28 DIAGNOSIS — R0602 Shortness of breath: Secondary | ICD-10-CM

## 2020-10-28 DIAGNOSIS — K259 Gastric ulcer, unspecified as acute or chronic, without hemorrhage or perforation: Secondary | ICD-10-CM

## 2020-10-28 DIAGNOSIS — G629 Polyneuropathy, unspecified: Secondary | ICD-10-CM | POA: Diagnosis present

## 2020-10-28 DIAGNOSIS — R5383 Other fatigue: Secondary | ICD-10-CM | POA: Diagnosis not present

## 2020-10-28 DIAGNOSIS — R1310 Dysphagia, unspecified: Secondary | ICD-10-CM | POA: Diagnosis present

## 2020-10-28 DIAGNOSIS — K219 Gastro-esophageal reflux disease without esophagitis: Secondary | ICD-10-CM | POA: Diagnosis present

## 2020-10-28 DIAGNOSIS — K31811 Angiodysplasia of stomach and duodenum with bleeding: Principal | ICD-10-CM | POA: Diagnosis present

## 2020-10-28 DIAGNOSIS — I110 Hypertensive heart disease with heart failure: Secondary | ICD-10-CM | POA: Diagnosis not present

## 2020-10-28 DIAGNOSIS — Z96642 Presence of left artificial hip joint: Secondary | ICD-10-CM | POA: Diagnosis present

## 2020-10-28 DIAGNOSIS — R509 Fever, unspecified: Secondary | ICD-10-CM | POA: Diagnosis not present

## 2020-10-28 DIAGNOSIS — Z8673 Personal history of transient ischemic attack (TIA), and cerebral infarction without residual deficits: Secondary | ICD-10-CM

## 2020-10-28 DIAGNOSIS — Z6830 Body mass index (BMI) 30.0-30.9, adult: Secondary | ICD-10-CM

## 2020-10-28 DIAGNOSIS — E669 Obesity, unspecified: Secondary | ICD-10-CM | POA: Diagnosis present

## 2020-10-28 DIAGNOSIS — Z8249 Family history of ischemic heart disease and other diseases of the circulatory system: Secondary | ICD-10-CM

## 2020-10-28 DIAGNOSIS — R42 Dizziness and giddiness: Secondary | ICD-10-CM

## 2020-10-28 DIAGNOSIS — R531 Weakness: Secondary | ICD-10-CM | POA: Diagnosis not present

## 2020-10-28 DIAGNOSIS — M8580 Other specified disorders of bone density and structure, unspecified site: Secondary | ICD-10-CM | POA: Diagnosis present

## 2020-10-28 DIAGNOSIS — Z79899 Other long term (current) drug therapy: Secondary | ICD-10-CM

## 2020-10-28 DIAGNOSIS — K279 Peptic ulcer, site unspecified, unspecified as acute or chronic, without hemorrhage or perforation: Secondary | ICD-10-CM | POA: Diagnosis present

## 2020-10-28 DIAGNOSIS — R0902 Hypoxemia: Secondary | ICD-10-CM | POA: Diagnosis not present

## 2020-10-28 DIAGNOSIS — K449 Diaphragmatic hernia without obstruction or gangrene: Secondary | ICD-10-CM

## 2020-10-28 DIAGNOSIS — G2581 Restless legs syndrome: Secondary | ICD-10-CM | POA: Diagnosis present

## 2020-10-28 DIAGNOSIS — Z823 Family history of stroke: Secondary | ICD-10-CM

## 2020-10-28 DIAGNOSIS — D62 Acute posthemorrhagic anemia: Secondary | ICD-10-CM | POA: Diagnosis not present

## 2020-10-28 DIAGNOSIS — Z853 Personal history of malignant neoplasm of breast: Secondary | ICD-10-CM

## 2020-10-28 DIAGNOSIS — E869 Volume depletion, unspecified: Secondary | ICD-10-CM | POA: Diagnosis present

## 2020-10-28 DIAGNOSIS — D649 Anemia, unspecified: Secondary | ICD-10-CM | POA: Diagnosis not present

## 2020-10-28 DIAGNOSIS — R8271 Bacteriuria: Secondary | ICD-10-CM | POA: Diagnosis present

## 2020-10-28 DIAGNOSIS — R152 Fecal urgency: Secondary | ICD-10-CM | POA: Diagnosis present

## 2020-10-28 LAB — CBC
HCT: 30.8 % — ABNORMAL LOW (ref 36.0–46.0)
Hemoglobin: 8.6 g/dL — ABNORMAL LOW (ref 12.0–15.0)
MCH: 21.2 pg — ABNORMAL LOW (ref 26.0–34.0)
MCHC: 27.9 g/dL — ABNORMAL LOW (ref 30.0–36.0)
MCV: 75.9 fL — ABNORMAL LOW (ref 80.0–100.0)
Platelets: 239 10*3/uL (ref 150–400)
RBC: 4.06 MIL/uL (ref 3.87–5.11)
RDW: 18.9 % — ABNORMAL HIGH (ref 11.5–15.5)
WBC: 3.9 10*3/uL — ABNORMAL LOW (ref 4.0–10.5)
nRBC: 0 % (ref 0.0–0.2)

## 2020-10-28 LAB — URINALYSIS, ROUTINE W REFLEX MICROSCOPIC
Bilirubin Urine: NEGATIVE
Glucose, UA: NEGATIVE mg/dL
Hgb urine dipstick: NEGATIVE
Ketones, ur: NEGATIVE mg/dL
Leukocytes,Ua: NEGATIVE
Nitrite: POSITIVE — AB
Protein, ur: NEGATIVE mg/dL
Specific Gravity, Urine: >1.03 — ABNORMAL HIGH (ref 1.005–1.030)
pH: 6 (ref 5.0–8.0)

## 2020-10-28 LAB — BASIC METABOLIC PANEL
Anion gap: 7 (ref 5–15)
BUN: 18 mg/dL (ref 8–23)
CO2: 24 mmol/L (ref 22–32)
Calcium: 8.3 mg/dL — ABNORMAL LOW (ref 8.9–10.3)
Chloride: 107 mmol/L (ref 98–111)
Creatinine, Ser: 0.71 mg/dL (ref 0.44–1.00)
GFR, Estimated: >60 mL/min (ref >60)
Glucose, Bld: 99 mg/dL (ref 70–99)
Potassium: 4.2 mmol/L (ref 3.5–5.1)
Sodium: 138 mmol/L (ref 135–145)

## 2020-10-28 LAB — POC OCCULT BLOOD, ED: Fecal Occult Bld: POSITIVE — AB

## 2020-10-28 LAB — URINALYSIS, MICROSCOPIC (REFLEX)
RBC / HPF: NONE SEEN RBC/hpf (ref 0–5)
Squamous Epithelial / HPF: NONE SEEN (ref 0–5)

## 2020-10-28 NOTE — ED Provider Notes (Signed)
Emergency Medicine Provider Triage Evaluation Note  Shelley Coleman , a 80 y.o. female  was evaluated in triage.  Pt complains of pain, shortness of breath, and near syncope.  Pain and shortness of breath have been present over the last 2 to 3 months.  The symptoms have gradually worsened over time.  Patient reports that this morning she had an episode of near syncope when she got up to go to the bathroom.  Patient had another episode of near syncope later in the morning.  Again patient denies any loss of consciousness at present patient feels lightheaded.  She has had chest pain over the last 2 to 3 months.  Patient has been constant.  Reports that she had a fever 4 days prior.  Review of Systems  Positive: Shortness of breath, fatigue, lightheadedness Negative: Syncope, chest pain, leg swelling  Physical Exam  BP 108/71 (BP Location: Left Arm)   Pulse 64   Temp 98.1 F (36.7 C) (Oral)   Resp 14   SpO2 99%  Gen:   Awake, no distress   Resp:  Normal effort, patient speaks in full complete sentences without difficulty, lungs clear to auscultation bilaterally MSK:   Moves extremities without difficulty  Other:  S1, S2 present with no murmurs or gallops.  +2 radial pulse bilaterally  Medical Decision Making  Medically screening exam initiated at 5:36 PM.  Appropriate orders placed.  VANYA CARBERRY was informed that the remainder of the evaluation will be completed by another provider, this initial triage assessment does not replace that evaluation, and the importance of remaining in the ED until their evaluation is complete.  The patient appears stable so that the remainder of the work up may be completed by another provider.      Loni Beckwith, PA-C 10/28/20 [place], [place], DO 10/28/20 [id number]

## 2020-10-28 NOTE — ED Triage Notes (Signed)
Pt c/o "cold-like" symptoms x2wks, fatigue. New-onset syncopal symptoms, episodes of lightheadedness/ dizziness x2. VSS, EKG unremarkable 18L hand Vaccinated & boosted against covid

## 2020-10-28 NOTE — ED Provider Notes (Signed)
[facility] EMERGENCY DEPARTMENT Provider Note   CSN: [id number] Arrival date & time: 10/28/20  1638     History Chief Complaint  Patient presents with   Fatigue    Shelley Coleman is a 80 y.o. female.  The history is provided by the patient and a relative.  Weakness Severity:  Moderate Onset quality:  Gradual Timing:  Constant Progression:  Worsening Chronicity:  New Relieved by:  Rest Worsened by:  Activity Associated symptoms: dizziness, fever and nausea   Associated symptoms: no abdominal pain, no cough, no diarrhea, no dysuria, no frequency, no hematochezia, no loss of consciousness, no melena and no vomiting   PT With previous history of stroke, anemia presents with generalized fatigue.  Patient reports for the past several months she has had increasing fatigue.  She reports increasing dyspnea on exertion.  Over the past several days it has  felt worse.  Over the past 24 hours she had a near syncopal episode upon standing.  She reports when she stood up her blood pressure dropped to the 90s and she checked her blood pressure at home. No chest pain. Also reports in the past 3 days she has had a fever.  No other real symptoms with fever.  Denies cough/sore throat/vomiting/diarrhea.   No new medications.  Reports a history of chronic anemia and used to require iron infusions but that was several years ago. She does not take daily iron Past Medical History:  Diagnosis Date   Anemia    Has had iron infusions   Breast cancer (HCC)    right   Cerebrovascular disease 11/12/2018   Depression    Depression    GERD (gastroesophageal reflux disease)    Hiatal hernia    History of GI bleed    Hx of mitral valve prolapse    Hyperlipidemia    Irregular heart beat    Neuropathy    numbness in both feet   Nocturnal leg cramps 09/02/2015   Optic neuritis    left   Osteopenia    Ovarian cyst    Parotid tumor    right   Pelvic fracture (HCC)    Restless legs  syndrome    Shingles 2000   Left in V1   Stroke [facility])     Patient Active Problem List   Diagnosis Date Noted   Cerebrovascular disease 11/12/2018   Paresthesia 11/12/2015   Right arm numbness    Complicated migraine    Anxiety state    Acute ischemic stroke ([place]) 11/01/2015   Intermittent tingling of right hand and foot    HLD (hyperlipidemia)    Nocturnal leg cramps 09/02/2015   Essential hypertension 01/25/2015   Dysplastic nevus of trunk 04/01/2012   Anxiety and depression 03/19/2012   Thoracic or lumbosacral neuritis or radiculitis, unspecified 01/10/2012   Scoliosis (and kyphoscoliosis), idiopathic 01/10/2012   Malignant neoplasm of breast (female), unspecified site 01/10/2012   Essential and other specified forms of tremor 01/10/2012   Optic neuritis, unspecified 01/10/2012   Other specified visual disturbances 01/10/2012   Restless legs syndrome (RLS) 01/10/2012   Pain in limb 01/10/2012   Lumbosacral root lesions, not elsewhere classified 01/10/2012   Back pain 06/27/2011   Dysphasia 06/27/2011   Hereditary and idiopathic peripheral neuropathy 07/13/2009   CARCINOMA, BASAL CELL, BACK 10/05/2007   BREAST CANCER, HX OF 08/01/2007   GERD 03/07/2007    Past Surgical History:  Procedure Laterality Date   ABDOMINAL HYSTERECTOMY  APPENDECTOMY     BREAST LUMPECTOMY Left    CATARACT EXTRACTION     ESOPHAGEAL MANOMETRY N/A 01/13/2013   Procedure: ESOPHAGEAL MANOMETRY (EM);  Surgeon: Sable Feil, MD;  Location: [facility] ENDOSCOPY;  Service: Endoscopy;  Laterality: N/A;   HERNIA REPAIR  08/2009   MASTECTOMY Right    SPINE SURGERY     TOTAL HIP ARTHROPLASTY Left      OB History   No obstetric history on file.     Family History  Problem Relation Age of Onset   Cancer Mother        Angio sarcoma    Heart disease Father    Hypertension Father    Stroke Father        x2   Heart attack Father        x 11   Hernia Brother    Hypertension Brother      Social History   Tobacco Use   Smoking status: Never   Smokeless tobacco: Never  Substance Use Topics   Alcohol use: Yes    Alcohol/week: 1.0 standard drink    Types: 1 Glasses of wine per week    Comment: Rarely,socially   Drug use: No    Home Medications Prior to Admission medications   Medication Sig Start Date End Date Taking? Authorizing Provider  ALPRAZolam Duanne Moron) 1 MG tablet TAKE 1 TABLET BY MOUTH AT BEDTIME AS NEEDED 03/04/20   Nafziger, Tommi Rumps, NP  atenolol (TENORMIN) 50 MG tablet TAKE 1 TABLET BY MOUTH ONCE DAILY . APPOINTMENT REQUIRED FOR FUTURE REFILLS 10/18/20   Dorothyann Peng, NP  ciprofloxacin (CIPRO) 500 MG tablet Take 500 mg by mouth 2 (two) times daily. Prescribed on 04/21/20 for seven days - UTI.    [provider]  dexlansoprazole (DEXILANT) 60 MG capsule Take 1 capsule (60 mg total) by mouth daily. 09/23/20   Nafziger, Tommi Rumps, NP  fluticasone (FLONASE) 50 MCG/ACT nasal spray Place 2 sprays into both nostrils daily. 02/27/19   Nafziger, Tommi Rumps, NP  hydrochlorothiazide (HYDRODIURIL) 25 MG tablet Take 1 tablet (25 mg total) by mouth daily. 04/28/16   Nafziger, Tommi Rumps, NP  MELATONIN PO Take 1 tablet by mouth at bedtime as needed (for sleep).     [provider]  nitroGLYCERIN (NITROSTAT) 0.4 MG SL tablet PLACE ONE TABLET UNDER THE TONGUE EVERY 5 MINUTES AS NEEDED. 10/10/16   Nafziger, Tommi Rumps, NP  rOPINIRole (REQUIP) 1 MG tablet Take 1-2 mg by mouth as needed (RLS).    [provider]    Allergies    Diflunisal, Lipitor [atorvastatin], Statins, and Tetanus toxoid  Review of Systems   Review of Systems  Constitutional:  Positive for fever.  Respiratory:  Negative for cough.   Gastrointestinal:  Positive for nausea. Negative for abdominal pain, blood in stool, diarrhea, hematochezia, melena and vomiting.  Genitourinary:  Negative for dysuria, frequency, hematuria and vaginal bleeding.  Neurological:  Positive for dizziness, weakness and  light-headedness. Negative for loss of consciousness.  All other systems reviewed and are negative.  Physical Exam Updated Vital Signs BP (!) 148/60 (BP Location: Right Arm)   Pulse 79   Temp 98.1 F (36.7 C) (Oral)   Resp 18   SpO2 93%   Physical Exam CONSTITUTIONAL: Elderly, pale HEAD: Normocephalic/atraumatic EYES: EOMI/PERRL, conjunctiva pale ENMT: Mucous membranes dry NECK: supple no meningeal signs SPINE/BACK:entire spine nontender CV: S1/S2 noted, no murmurs/rubs/gallops noted LUNGS: Lungs are clear to auscultation bilaterally, no apparent distress ABDOMEN: soft, nontender, no rebound or guarding,  bowel sounds noted throughout abdomen GU:no cva tenderness Rectal-no blood or melena, Hemoccult positive.  Nurse Rolanda Lundborg present for exam NEURO: Pt is awake/alert/appropriate, moves all extremitiesx4.  No facial droop.  No arm or leg drift EXTREMITIES: pulses normal/equal, full ROM SKIN: warm, pale PSYCH: no abnormalities of mood noted, alert and oriented to situation  ED Results / Procedures / Treatments   Labs (all labs ordered are listed, but only abnormal results are displayed) Labs Reviewed  BASIC METABOLIC PANEL - Abnormal; Notable for the following components:      Result Value   Calcium 8.3 (*)    All other components within normal limits  CBC - Abnormal; Notable for the following components:   WBC 3.9 (*)    Hemoglobin 8.6 (*)    HCT 30.8 (*)    MCV 75.9 (*)    MCH 21.2 (*)    MCHC 27.9 (*)    RDW 18.9 (*)    All other components within normal limits  URINALYSIS, ROUTINE W REFLEX MICROSCOPIC - Abnormal; Notable for the following components:   Specific Gravity, Urine >1.030 (*)    Nitrite POSITIVE (*)    All other components within normal limits  URINALYSIS, MICROSCOPIC (REFLEX) - Abnormal; Notable for the following components:   Bacteria, UA MANY (*)    All other components within normal limits  POC OCCULT BLOOD, ED - Abnormal; Notable for the following  components:   Fecal Occult Bld POSITIVE (*)    All other components within normal limits  RESP PANEL BY RT-PCR (FLU A&B, COVID) ARPGX2  URINE CULTURE  HEPATIC FUNCTION PANEL  DIFFERENTIAL  CBG MONITORING, ED    EKG EKG Interpretation  Date/Time:  Thursday October 28 2020 16:40:48 EDT Ventricular Rate:  67 PR Interval:  148 QRS Duration: 74 QT Interval:  410 QTC Calculation: 433 R Axis:   52 Text Interpretation: Normal sinus rhythm Normal ECG Confirmed by Ripley Fraise [phone]) on 10/28/2020 11:11:23 PM  Radiology DG Chest 2 View  Result Date: 10/28/2020 CLINICAL DATA:  Cold-like symptoms for 2 weeks, fatigue EXAM: CHEST - 2 VIEW COMPARISON:  Radiograph 11/01/2015 FINDINGS: Some mild airways thickening and increasing interstitial opacity within the lungs without focal consolidative density. No pneumothorax or visible effusion. Pulmonary vascularity is normally distributed. Stable cardiomediastinal contours. No acute osseous or soft tissue abnormality. Dextrocurvature of the spine is similar to priors. IMPRESSION: Mild airways thickening increasing interstitial opacity in the lungs, can be seen with atypical/viral infections. Electronically Signed   By: Lovena Le M.D.   On: 10/28/2020 18:25    Procedures Procedures   Medications Ordered in ED Medications - No data to display  ED Course  I have reviewed the triage vital signs and the nursing notes.  Pertinent labs & imaging results that were available during my care of the patient were reviewed by me and considered in my medical decision making (see chart for details).    MDM Rules/Calculators/A&P                          This patient presents to the ED for concern of weakness/near syncope, this involves an extensive number of treatment options, and is a complaint that carries with it a high risk of complications and morbidity.  The differential diagnosis includes arrhythmia, dehydration, infection, anemia, acute coronary  syndrome   Lab Tests:  I Ordered, reviewed, and interpreted labs, which included electrolytes, complete blood count, Hemoccult, urinalysis Urinalysis reveals positive nitrites,  but patient has had this previously denies any urinary symptoms  Imaging Studies ordered:  I ordered imaging studies which included chest x-ray  I independently visualized and interpreted imaging which showed no acute findings  Additional history obtained:  Additional history obtained from daughter Previous records obtained and reviewed   Consultations Obtained:  I consulted hospitalist Dr Cyd Silence  and discussed lab and imaging findings  Reevaluation:  After the interventions stated above, I reevaluated the patient and found pt stable  12:36 AM Patient found to have acute anemia with a positive Hemoccult.  However vital signs are appropriate at this time.  I suspect the worsening anemia is triggering her increasing fatigue and dyspnea on exertion.  Patient also reports recent fevers, COVID-19 test pending.  Discussed with hospitalist for admission Final Clinical Impression(s) / ED Diagnoses Final diagnoses:  Acute anemia    Rx / DC Orders ED Discharge Orders     None        Ripley Fraise, MD 10/29/20 [phone]

## 2020-10-29 ENCOUNTER — Encounter (HOSPITAL_COMMUNITY): Payer: Self-pay | Admitting: Internal Medicine

## 2020-10-29 ENCOUNTER — Observation Stay (HOSPITAL_COMMUNITY): Payer: Medicare Other

## 2020-10-29 DIAGNOSIS — D62 Acute posthemorrhagic anemia: Secondary | ICD-10-CM | POA: Diagnosis not present

## 2020-10-29 DIAGNOSIS — R55 Syncope and collapse: Secondary | ICD-10-CM

## 2020-10-29 DIAGNOSIS — I679 Cerebrovascular disease, unspecified: Secondary | ICD-10-CM | POA: Diagnosis present

## 2020-10-29 DIAGNOSIS — K279 Peptic ulcer, site unspecified, unspecified as acute or chronic, without hemorrhage or perforation: Secondary | ICD-10-CM | POA: Diagnosis not present

## 2020-10-29 DIAGNOSIS — I10 Essential (primary) hypertension: Secondary | ICD-10-CM | POA: Diagnosis not present

## 2020-10-29 DIAGNOSIS — K219 Gastro-esophageal reflux disease without esophagitis: Secondary | ICD-10-CM

## 2020-10-29 DIAGNOSIS — I110 Hypertensive heart disease with heart failure: Secondary | ICD-10-CM | POA: Diagnosis not present

## 2020-10-29 DIAGNOSIS — K760 Fatty (change of) liver, not elsewhere classified: Secondary | ICD-10-CM | POA: Diagnosis not present

## 2020-10-29 DIAGNOSIS — U071 COVID-19: Secondary | ICD-10-CM | POA: Diagnosis not present

## 2020-10-29 DIAGNOSIS — I341 Nonrheumatic mitral (valve) prolapse: Secondary | ICD-10-CM | POA: Diagnosis not present

## 2020-10-29 DIAGNOSIS — E785 Hyperlipidemia, unspecified: Secondary | ICD-10-CM | POA: Diagnosis present

## 2020-10-29 DIAGNOSIS — G2581 Restless legs syndrome: Secondary | ICD-10-CM

## 2020-10-29 DIAGNOSIS — D509 Iron deficiency anemia, unspecified: Secondary | ICD-10-CM

## 2020-10-29 DIAGNOSIS — Z853 Personal history of malignant neoplasm of breast: Secondary | ICD-10-CM | POA: Diagnosis not present

## 2020-10-29 DIAGNOSIS — M8580 Other specified disorders of bone density and structure, unspecified site: Secondary | ICD-10-CM | POA: Diagnosis present

## 2020-10-29 DIAGNOSIS — Z8673 Personal history of transient ischemic attack (TIA), and cerebral infarction without residual deficits: Secondary | ICD-10-CM | POA: Diagnosis not present

## 2020-10-29 DIAGNOSIS — Z90710 Acquired absence of both cervix and uterus: Secondary | ICD-10-CM | POA: Diagnosis not present

## 2020-10-29 DIAGNOSIS — G629 Polyneuropathy, unspecified: Secondary | ICD-10-CM | POA: Diagnosis present

## 2020-10-29 DIAGNOSIS — R42 Dizziness and giddiness: Secondary | ICD-10-CM | POA: Diagnosis not present

## 2020-10-29 DIAGNOSIS — K31819 Angiodysplasia of stomach and duodenum without bleeding: Secondary | ICD-10-CM | POA: Diagnosis not present

## 2020-10-29 DIAGNOSIS — D500 Iron deficiency anemia secondary to blood loss (chronic): Secondary | ICD-10-CM | POA: Diagnosis not present

## 2020-10-29 DIAGNOSIS — T18128A Food in esophagus causing other injury, initial encounter: Secondary | ICD-10-CM | POA: Diagnosis not present

## 2020-10-29 DIAGNOSIS — I5032 Chronic diastolic (congestive) heart failure: Secondary | ICD-10-CM | POA: Diagnosis not present

## 2020-10-29 DIAGNOSIS — D649 Anemia, unspecified: Secondary | ICD-10-CM | POA: Diagnosis not present

## 2020-10-29 DIAGNOSIS — R152 Fecal urgency: Secondary | ICD-10-CM | POA: Diagnosis present

## 2020-10-29 DIAGNOSIS — R531 Weakness: Secondary | ICD-10-CM | POA: Diagnosis not present

## 2020-10-29 DIAGNOSIS — E669 Obesity, unspecified: Secondary | ICD-10-CM | POA: Diagnosis not present

## 2020-10-29 DIAGNOSIS — Z96642 Presence of left artificial hip joint: Secondary | ICD-10-CM | POA: Diagnosis present

## 2020-10-29 DIAGNOSIS — K921 Melena: Secondary | ICD-10-CM | POA: Diagnosis not present

## 2020-10-29 DIAGNOSIS — R1310 Dysphagia, unspecified: Secondary | ICD-10-CM | POA: Diagnosis present

## 2020-10-29 DIAGNOSIS — K449 Diaphragmatic hernia without obstruction or gangrene: Secondary | ICD-10-CM | POA: Diagnosis not present

## 2020-10-29 DIAGNOSIS — Z9011 Acquired absence of right breast and nipple: Secondary | ICD-10-CM | POA: Diagnosis not present

## 2020-10-29 DIAGNOSIS — Z6830 Body mass index (BMI) 30.0-30.9, adult: Secondary | ICD-10-CM | POA: Diagnosis not present

## 2020-10-29 DIAGNOSIS — K259 Gastric ulcer, unspecified as acute or chronic, without hemorrhage or perforation: Secondary | ICD-10-CM | POA: Diagnosis not present

## 2020-10-29 DIAGNOSIS — R0602 Shortness of breath: Secondary | ICD-10-CM | POA: Diagnosis not present

## 2020-10-29 DIAGNOSIS — K31811 Angiodysplasia of stomach and duodenum with bleeding: Secondary | ICD-10-CM | POA: Diagnosis not present

## 2020-10-29 LAB — DIFFERENTIAL
Abs Immature Granulocytes: 0.01 10*3/uL (ref 0.00–0.07)
Basophils Absolute: 0 10*3/uL (ref 0.0–0.1)
Basophils Relative: 1 %
Eosinophils Absolute: 0.1 10*3/uL (ref 0.0–0.5)
Eosinophils Relative: 2 %
Immature Granulocytes: 0 %
Lymphocytes Relative: 48 %
Lymphs Abs: 1.9 10*3/uL (ref 0.7–4.0)
Monocytes Absolute: 0.3 10*3/uL (ref 0.1–1.0)
Monocytes Relative: 9 %
Neutro Abs: 1.5 10*3/uL — ABNORMAL LOW (ref 1.7–7.7)
Neutrophils Relative %: 40 %

## 2020-10-29 LAB — HEMOGLOBIN AND HEMATOCRIT, BLOOD
HCT: 30.5 % — ABNORMAL LOW (ref 36.0–46.0)
Hemoglobin: 9.1 g/dL — ABNORMAL LOW (ref 12.0–15.0)

## 2020-10-29 LAB — RETICULOCYTES
Immature Retic Fract: 28.5 % — ABNORMAL HIGH (ref 2.3–15.9)
RBC.: 3.76 MIL/uL — ABNORMAL LOW (ref 3.87–5.11)
Retic Count, Absolute: 38.7 10*3/uL (ref 19.0–186.0)
Retic Ct Pct: 1 % (ref 0.4–3.1)

## 2020-10-29 LAB — COMPREHENSIVE METABOLIC PANEL
ALT: 11 U/L (ref 0–44)
AST: 20 U/L (ref 15–41)
Albumin: 3.1 g/dL — ABNORMAL LOW (ref 3.5–5.0)
Alkaline Phosphatase: 64 U/L (ref 38–126)
Anion gap: 9 (ref 5–15)
BUN: 13 mg/dL (ref 8–23)
CO2: 22 mmol/L (ref 22–32)
Calcium: 8.4 mg/dL — ABNORMAL LOW (ref 8.9–10.3)
Chloride: 107 mmol/L (ref 98–111)
Creatinine, Ser: 0.61 mg/dL (ref 0.44–1.00)
GFR, Estimated: >60 mL/min (ref >60)
Glucose, Bld: 84 mg/dL (ref 70–99)
Potassium: 3.7 mmol/L (ref 3.5–5.1)
Sodium: 138 mmol/L (ref 135–145)
Total Bilirubin: 0.5 mg/dL (ref 0.3–1.2)
Total Protein: 5.3 g/dL — ABNORMAL LOW (ref 6.5–8.1)

## 2020-10-29 LAB — VITAMIN B12: Vitamin B-12: 439 pg/mL (ref 180–914)

## 2020-10-29 LAB — PREPARE RBC (CROSSMATCH)

## 2020-10-29 LAB — CBC
HCT: 26.9 % — ABNORMAL LOW (ref 36.0–46.0)
Hemoglobin: 7.8 g/dL — ABNORMAL LOW (ref 12.0–15.0)
MCH: 21.2 pg — ABNORMAL LOW (ref 26.0–34.0)
MCHC: 29 g/dL — ABNORMAL LOW (ref 30.0–36.0)
MCV: 73.1 fL — ABNORMAL LOW (ref 80.0–100.0)
Platelets: 208 10*3/uL (ref 150–400)
RBC: 3.68 MIL/uL — ABNORMAL LOW (ref 3.87–5.11)
RDW: 18.7 % — ABNORMAL HIGH (ref 11.5–15.5)
WBC: 3.8 10*3/uL — ABNORMAL LOW (ref 4.0–10.5)
nRBC: 0 % (ref 0.0–0.2)

## 2020-10-29 LAB — C-REACTIVE PROTEIN: CRP: 0.5 mg/dL (ref <1.0)

## 2020-10-29 LAB — D-DIMER, QUANTITATIVE: D-Dimer, Quant: 0.88 ug/mL-FEU — ABNORMAL HIGH (ref 0.00–0.50)

## 2020-10-29 LAB — IRON AND TIBC
Iron: 22 ug/dL — ABNORMAL LOW (ref 28–170)
Saturation Ratios: 5 % — ABNORMAL LOW (ref 10.4–31.8)
TIBC: 442 ug/dL (ref 250–450)
UIBC: 420 ug/dL

## 2020-10-29 LAB — APTT: aPTT: 25 seconds (ref 24–36)

## 2020-10-29 LAB — PROTIME-INR
INR: 1.1 (ref 0.8–1.2)
Prothrombin Time: 13.9 seconds (ref 11.4–15.2)

## 2020-10-29 LAB — RESP PANEL BY RT-PCR (FLU A&B, COVID) ARPGX2
Influenza A by PCR: NEGATIVE
Influenza B by PCR: NEGATIVE
SARS Coronavirus 2 by RT PCR: POSITIVE — AB

## 2020-10-29 LAB — BRAIN NATRIURETIC PEPTIDE: B Natriuretic Peptide: 78 pg/mL (ref 0.0–100.0)

## 2020-10-29 LAB — GLUCOSE, CAPILLARY: Glucose-Capillary: 93 mg/dL (ref 70–99)

## 2020-10-29 LAB — ABO/RH: ABO/RH(D): A POS

## 2020-10-29 LAB — FOLATE: Folate: 24.5 ng/mL (ref >5.9)

## 2020-10-29 LAB — PROCALCITONIN: Procalcitonin: <0.1 ng/mL

## 2020-10-29 MED ORDER — ASCORBIC ACID 500 MG PO TABS
500.0000 mg | ORAL_TABLET | Freq: Every day | ORAL | Status: DC
  Administered 2020-10-29 – 2020-11-01 (×3): 500 mg via ORAL
  Filled 2020-10-29 (×3): qty 1

## 2020-10-29 MED ORDER — FOLIC ACID 1 MG PO TABS
1.0000 mg | ORAL_TABLET | Freq: Every day | ORAL | Status: DC
  Administered 2020-10-29 – 2020-11-01 (×3): 1 mg via ORAL
  Filled 2020-10-29 (×3): qty 1

## 2020-10-29 MED ORDER — POLYETHYLENE GLYCOL 3350 17 G PO PACK: 17.0000 g | PACK | Freq: Every day | ORAL | Status: DC | PRN

## 2020-10-29 MED ORDER — PANTOPRAZOLE SODIUM 40 MG IV SOLR
40.0000 mg | Freq: Two times a day (BID) | INTRAVENOUS | Status: DC
  Administered 2020-10-29 – 2020-10-31 (×4): 40 mg via INTRAVENOUS
  Filled 2020-10-29 (×4): qty 40

## 2020-10-29 MED ORDER — FERROUS SULFATE 325 (65 FE) MG PO TABS
325.0000 mg | ORAL_TABLET | Freq: Two times a day (BID) | ORAL | Status: DC
  Administered 2020-10-29 – 2020-11-01 (×2): 325 mg via ORAL
  Filled 2020-10-29 (×2): qty 1

## 2020-10-29 MED ORDER — LACTATED RINGERS IV BOLUS
500.0000 mL | Freq: Once | INTRAVENOUS | Status: AC
  Administered 2020-10-29: 500 mL via INTRAVENOUS

## 2020-10-29 MED ORDER — SODIUM CHLORIDE 0.9 % IV SOLN
200.0000 mg | Freq: Once | INTRAVENOUS | Status: AC
  Administered 2020-10-29: 200 mg via INTRAVENOUS
  Filled 2020-10-29: qty 40

## 2020-10-29 MED ORDER — LACTATED RINGERS IV SOLN: INTRAVENOUS | Status: DC

## 2020-10-29 MED ORDER — PANTOPRAZOLE SODIUM 40 MG IV SOLR
40.0000 mg | Freq: Two times a day (BID) | INTRAVENOUS | Status: DC
  Administered 2020-10-29: 40 mg via INTRAVENOUS
  Filled 2020-10-29: qty 40

## 2020-10-29 MED ORDER — ALBUTEROL SULFATE HFA 108 (90 BASE) MCG/ACT IN AERS
2.0000 | INHALATION_SPRAY | RESPIRATORY_TRACT | Status: DC | PRN
  Filled 2020-10-29: qty 6.7

## 2020-10-29 MED ORDER — SODIUM CHLORIDE 0.9% IV SOLUTION: Freq: Once | INTRAVENOUS | Status: AC

## 2020-10-29 MED ORDER — ROPINIROLE HCL 1 MG PO TABS
3.0000 mg | ORAL_TABLET | Freq: Every evening | ORAL | Status: DC | PRN
  Administered 2020-10-29 – 2020-10-30 (×2): 3 mg via ORAL
  Filled 2020-10-29 (×3): qty 3

## 2020-10-29 MED ORDER — ONDANSETRON HCL 4 MG/2ML IJ SOLN: 4.0000 mg | Freq: Four times a day (QID) | INTRAMUSCULAR | Status: DC | PRN

## 2020-10-29 MED ORDER — PANTOPRAZOLE SODIUM 40 MG PO TBEC: 40.0000 mg | DELAYED_RELEASE_TABLET | Freq: Two times a day (BID) | ORAL | Status: DC

## 2020-10-29 MED ORDER — ZINC SULFATE 220 (50 ZN) MG PO CAPS
220.0000 mg | ORAL_CAPSULE | Freq: Every day | ORAL | Status: DC
  Administered 2020-10-29 – 2020-11-01 (×3): 220 mg via ORAL
  Filled 2020-10-29 (×3): qty 1

## 2020-10-29 MED ORDER — ACETAMINOPHEN 325 MG PO TABS
650.0000 mg | ORAL_TABLET | Freq: Four times a day (QID) | ORAL | Status: DC | PRN
  Administered 2020-10-29 – 2020-10-31 (×3): 650 mg via ORAL
  Filled 2020-10-29 (×2): qty 2

## 2020-10-29 MED ORDER — ONDANSETRON HCL 4 MG PO TABS: 4.0000 mg | ORAL_TABLET | Freq: Four times a day (QID) | ORAL | Status: DC | PRN

## 2020-10-29 MED ORDER — ALPRAZOLAM 0.5 MG PO TABS
1.0000 mg | ORAL_TABLET | Freq: Every evening | ORAL | Status: DC | PRN
  Administered 2020-10-31: 1 mg via ORAL
  Filled 2020-10-29: qty 2

## 2020-10-29 MED ORDER — GUAIFENESIN-DM 100-10 MG/5ML PO SYRP
10.0000 mL | ORAL_SOLUTION | ORAL | Status: DC | PRN
  Administered 2020-10-31 – 2020-11-01 (×3): 10 mL via ORAL
  Filled 2020-10-29 (×3): qty 10

## 2020-10-29 MED ORDER — LACTATED RINGERS IV SOLN: INTRAVENOUS | Status: AC

## 2020-10-29 MED ORDER — ATENOLOL 50 MG PO TABS
50.0000 mg | ORAL_TABLET | Freq: Every day | ORAL | Status: DC
  Administered 2020-10-29 – 2020-10-31 (×2): 50 mg via ORAL
  Filled 2020-10-29 (×2): qty 1

## 2020-10-29 MED ORDER — SODIUM CHLORIDE 0.9 % IV SOLN
100.0000 mg | Freq: Every day | INTRAVENOUS | Status: AC
  Administered 2020-10-30 – 2020-10-31 (×2): 100 mg via INTRAVENOUS
  Filled 2020-10-29 (×2): qty 20

## 2020-10-29 NOTE — Evaluation (Signed)
Physical Therapy Evaluation Patient Details Name: Shelley Coleman MRN: [id number] DOB: 07-20-1940 Today's Date: 10/29/2020   History of Present Illness  Pt is 80 yo female who presented on 10/28/20 with weakness and shortness of breath.  Pt found to be COVID + with symptomatic microcytic anemia. Pt with GI consult with Plan for EGD and colonscopy.  Medical hx includes HLD, RLS, CHF, prior back sx, L THA.   Clinical Impression  Pt admitted with above diagnosis. Prior to admission, pt completely independent and lived alone.  Today, she presented with fatigue and generalized weakness.  She was able to ambulate steadily in room with stable vitals, but fatigued easily.  Pt expected to progress well and have no PT needs at d/c.  Did discuss intermittent support from family which pt verbalized is possible.  From PT perspective, pt safe enough with transfer to move to [facility] or recliner independently - encouraged ambulation with nursing staff. Pt currently with functional limitations due to the deficits listed below (see PT Problem List). Pt will benefit from skilled PT to increase their independence and safety with mobility to allow discharge to the venue listed below.       Follow Up Recommendations No PT follow up    Equipment Recommendations  None recommended by PT    Recommendations for Other Services       Precautions / Restrictions Precautions Precautions: None      Mobility  Bed Mobility Overal bed mobility: Needs Assistance Bed Mobility: Supine to Sit;Sit to Supine     Supine to sit: Supervision Sit to supine: Supervision        Transfers Overall transfer level: Needs assistance Equipment used: None Transfers: Sit to/from Stand Sit to Stand: Supervision         General transfer comment: Supervision for safety; performed from bed and toilet  Ambulation/Gait Ambulation/Gait assistance: Min guard Gait Distance (Feet): 70 Feet Assistive device: None Gait Pattern/deviations:  Step-through pattern Gait velocity: decreased   General Gait Details: Decreased speed with easy fatigue but steady; cued for turns with line management  Stairs            Wheelchair Mobility    Modified Rankin (Stroke Patients Only)       Balance Overall balance assessment: Independent   Sitting balance-Leahy Scale: Normal       Standing balance-Leahy Scale: Good                               Pertinent Vitals/Pain Pain Assessment: No/denies pain    Home Living Family/patient expects to be discharged to:: Private residence Living Arrangements: Alone Available Help at Discharge: Family;Available PRN/intermittently Type of Home: House Home Access: Stairs to enter   Entrance Stairs-Number of Steps: 1 Home Layout: One level Home Equipment: Walker - standard;Shower seat - built in;Hand held shower head;Grab bars - tub/shower      Prior Function Level of Independence: Independent   Gait / Transfers Assistance Needed: ambulates in community  ADL's / Homemaking Assistance Needed: Independent with ADLs, IADLs , driving  Comments: Reports getting OOB and fatigued over the last month.     Hand Dominance        Extremity/Trunk Assessment   Upper Extremity Assessment Upper Extremity Assessment: Overall WFL for tasks assessed    Lower Extremity Assessment Lower Extremity Assessment: Overall WFL for tasks assessed    Cervical / Trunk Assessment Cervical / Trunk Assessment: Normal  Communication  Cognition Arousal/Alertness: Awake/alert Behavior During Therapy: WFL for tasks assessed/performed Overall Cognitive Status: Within Functional Limits for tasks assessed                                        General Comments General comments (skin integrity, edema, etc.): Pt's VSS.  O2 sats 97% on RA, HR 60's-70's, BP as follows: supine 150/83, Sit 153/81, stand 138/67    Exercises     Assessment/Plan    PT Assessment  Patient needs continued PT services  PT Problem List Decreased strength;Decreased mobility;Decreased activity tolerance       PT Treatment Interventions DME instruction;Therapeutic activities;Gait training;Therapeutic exercise;Patient/family education;Stair training;Balance training;Functional mobility training    PT Goals (Current goals can be found in the Care Plan section)  Acute Rehab PT Goals Patient Stated Goal: regain strength PT Goal Formulation: With patient Time For Goal Achievement: 11/12/20 Potential to Achieve Goals: Good    Frequency Min 3X/week   Barriers to discharge        Co-evaluation               AM-PAC PT "6 Clicks" Mobility  Outcome Measure Help needed turning from your back to your side while in a flat bed without using bedrails?: None Help needed moving from lying on your back to sitting on the side of a flat bed without using bedrails?: None Help needed moving to and from a bed to a chair (including a wheelchair)?: A Little Help needed standing up from a chair using your arms (e.g., wheelchair or bedside chair)?: A Little Help needed to walk in hospital room?: A Little Help needed climbing 3-5 steps with a railing? : A Little 6 Click Score: 20    End of Session Equipment Utilized During Treatment: Gait belt Activity Tolerance: Patient tolerated treatment well Patient left: in bed;with call bell/phone within reach Nurse Communication: Mobility status (safe to use BSC independently) PT Visit Diagnosis: Muscle weakness (generalized) (M62.81);Other abnormalities of gait and mobility (R26.89)    Time: 0488-8916 PT Time Calculation (min) (ACUTE ONLY): 25 min   Charges:   PT Evaluation $PT Eval Low Complexity: 1 Low PT Treatments $Gait Training: 8-22 mins        Abran Richard, PT Acute Rehab Services Pager [phone] [facility] [place] 10/29/2020, 5:00 PM

## 2020-10-29 NOTE — H&P (View-Only) (Signed)
[facility] Consult: 10:37 AM 10/29/2020  LOS: 0 days    Referring Provider: Dr Ronnie Derby  Primary Care Physician:  Shelley Peng, NP Primary Gastroenterologist:  Dr. Verl Blalock     Reason for Consultation:  anemia.     HPI: Shelley Coleman is a 80 y.o. female.  PMH CVA, chronic Plavix.  HH, GERD, fundoplication early [id number].  Breast cancer, mastectomy, chemotherapy with resulting peripheral neuropathy.  Left kidney hemorrhagic/proteinaceous cyst, 2, tiny, < 5 MM, enhancing foci in right liver, TSTC and hepatic steatosis on MR abdomen in 03/2017.  Mild CHF, EF 55 to 60% in 2017.  Latest endoscopic procedures include:  06/2009 colonoscopy.  For average risk screening and complaint of abdominal pain.  Melanosis coli, otherwise normal study 11/2012 EGD.  For epigastric pain, dysphagia.  Previous fundoplication.  Findings were of mild antral gastropathy probably from NSAIDs.  Intact esophageal fundoplication.  Some nonbleeding erosions at distal esophagus.  Esophagus dilated with #52 Isabell Jarvis.  Otherwise normal study to second duodenum.  Biopsies obtained for CLO testing.  Several weeks of dyspnea, nonexertional chest pain.  Couple of weeks progressive fatigue, upper respiratory symptoms.  More recent onset presyncope with lightheaded/dizziness.  Anorexia.  On Monday she had loose stools.  She had watery loose, nonbloody stools yesterday and again today.  Stools today were darker but not tarry or black.  With the loose stools she had fecal urgency associated with abdominal cramping but no persistent cramps or abdominal pain.  Fever 101.53F, chills 4 days prior to arrival.  Uses ibuprofen as needed.  In the last 2 weeks has used maybe 800 mg.  Reflux symptoms mostly controlled with Dexilant but occasional breakthrough  symptoms.  Occasional pill dysphagia.  COVID-19 positive.  She has been vaccinated and boosted with [employer] COVID-19 vaccine. EKG unremarkable Hgb 7.8.  Was 13.7 foourteen months ago.   MCV 73.  Platelets 208.  Normal BUN/creatinine.  Iron 22, low.  Iron sats 5%, low.  No ferritin level.  Folate and B12 normal. CXR with clear lungs, normal heart.  Small HH.    Past Medical History:  Diagnosis Date   Anemia    Has had iron infusions   Breast cancer ([place])    right   Cerebrovascular disease 11/12/2018   Depression    Depression    GERD (gastroesophageal reflux disease)    Hiatal hernia    History of GI bleed    Hx of mitral valve prolapse    Hyperlipidemia    Irregular heart beat    Neuropathy    numbness in both feet   Nocturnal leg cramps 09/02/2015   Optic neuritis    left   Osteopenia    Ovarian cyst    Parotid tumor    right   Pelvic fracture (H[facility])    Restless legs syndrome    Shingles 2000   Left in V1   Stroke [facility])     Past Surgical History:  Procedure Laterality Date   ABDOMINAL HYSTERECTOMY     APPENDECTOMY  BREAST LUMPECTOMY Left    CATARACT EXTRACTION     ESOPHAGEAL MANOMETRY N/A 01/13/2013   Procedure: ESOPHAGEAL MANOMETRY (EM);  Surgeon: Sable Feil, MD;  Location: [facility];  Service: Endoscopy;  Laterality: N/A;   HERNIA REPAIR  08/2009   MASTECTOMY Right    SPINE SURGERY     TOTAL HIP ARTHROPLASTY Left     Prior to Admission medications   Medication Sig Start Date End Date Taking? Authorizing Provider  acetaminophen (TYLENOL) 500 MG tablet Take 1,000 mg by mouth every 6 (six) hours as needed for moderate pain or headache.   Yes [provider]  ALPRAZolam (XANAX) 1 MG tablet TAKE 1 TABLET BY MOUTH AT BEDTIME AS NEEDED Patient taking differently: Take 1 mg by mouth at bedtime as needed for sleep. 03/04/20  Yes Coleman, Shelley Rumps, NP  atenolol (TENORMIN) 50 MG tablet TAKE 1 TABLET BY MOUTH ONCE DAILY . APPOINTMENT REQUIRED FOR FUTURE  REFILLS Patient taking differently: Take 50 mg by mouth daily. 10/18/20  Yes Coleman, Shelley Rumps, NP  clopidogrel (PLAVIX) 75 MG tablet Take 75 mg by mouth daily. 08/24/20  Yes [provider]  dexlansoprazole (DEXILANT) 60 MG capsule Take 1 capsule (60 mg total) by mouth daily. 09/23/20  Yes Coleman, Shelley Rumps, NP  hydrochlorothiazide (HYDRODIURIL) 25 MG tablet Take 1 tablet (25 mg total) by mouth daily. Patient taking differently: Take 25 mg by mouth daily as needed (fluid). 04/28/16  Yes Coleman, Shelley Rumps, NP  ibuprofen (ADVIL) 200 MG tablet Take 400 mg by mouth every 6 (six) hours as needed for headache or moderate pain.   Yes [provider]  Multiple Vitamins-Minerals (CENTRUM SILVER 50+WOMEN) TABS Take 1 tablet by mouth daily.   Yes [provider]  Multiple Vitamins-Minerals (PRESERVISION AREDS 2 PO) Take 1 tablet by mouth in the morning and at bedtime.   Yes [provider]  nitroGLYCERIN (NITROSTAT) 0.4 MG SL tablet PLACE ONE TABLET UNDER THE TONGUE EVERY 5 MINUTES AS NEEDED. Patient taking differently: Place 0.4 mg under the tongue every 5 (five) minutes as needed for chest pain. 10/10/16  Yes Coleman, Shelley Rumps, NP  Polyethyl Glycol-Propyl Glycol (SYSTANE OP) Place 1 drop into both eyes daily as needed (dry eyes).   Yes [provider]  rOPINIRole (REQUIP) 3 MG tablet Take 3 mg by mouth at bedtime as needed (rls).   Yes [provider]  vitamin B-12 (CYANOCOBALAMIN) 500 MCG tablet Take 500 mcg by mouth daily.   Yes [provider]  amoxicillin (AMOXIL) 500 MG capsule Take 500 mg by mouth See admin instructions. Bid x 10 days Patient not taking: Reported on 10/29/2020 10/11/20   [provider]  ciprofloxacin (CIPRO) 500 MG tablet Take 500 mg by mouth 2 (two) times daily. Prescribed on 04/21/20 for seven days - UTI. Patient not taking: No sig reported    [provider]  fluticasone (FLONASE) 50 MCG/ACT nasal spray Place 2 sprays  into both nostrils daily. Patient not taking: No sig reported 02/27/19   Shelley Peng, NP    Scheduled Meds:  vitamin C  500 mg Oral Daily   atenolol  50 mg Oral Daily   ferrous sulfate  325 mg Oral BID WC   folic acid  1 mg Oral Daily   pantoprazole (PROTONIX) IV  40 mg Intravenous Q12H   zinc sulfate  220 mg Oral Daily   Infusions:  lactated ringers 75 mL/hr at 10/29/20 0925   [START ON 10/30/2020] remdesivir 100 mg in NS 100  mL     PRN Meds: acetaminophen, albuterol, ALPRAZolam, guaiFENesin-dextromethorphan, ondansetron **OR** ondansetron (ZOFRAN) IV, polyethylene glycol, rOPINIRole   Allergies as of 10/28/2020 - Review Complete 10/28/2020  Allergen Reaction Noted   Diflunisal  03/07/2007   Lipitor [atorvastatin]  09/13/2016   Statins  03/13/2018   Tetanus toxoid  03/07/2007    Family History  Problem Relation Age of Onset   Cancer Mother        Angio sarcoma    Heart disease Father    Hypertension Father    Stroke Father        x2   Heart attack Father        x 11   Hernia Brother    Hypertension Brother     Social History   Socioeconomic History   Marital status: Married    Spouse name: Not on file   Number of children: 2   Years of education: 16   Highest education level: Not on file  Occupational History   Occupation: Retired Therapist, sports  Tobacco Use   Smoking status: Never   Smokeless tobacco: Never  Substance and Sexual Activity   Alcohol use: Yes    Alcohol/week: 1.0 standard drink    Types: 1 Glasses of wine per week    Comment: Rarely,socially   Drug use: No   Sexual activity: Not on file  Other Topics Concern   Not on file  Social History Narrative   Retired from nursing    Widowed    Left-handed      Social Determinants of Radio broadcast assistant Strain: Low Risk    Difficulty of Paying Living Expenses: Not hard at all  Food Insecurity: No Food Insecurity   Worried About Charity fundraiser in the Last Year: Never true   Academic librarian in the Last Year: Never true  Transportation Needs: No Transportation Needs   Lack of Transportation (Medical): No   Lack of Transportation (Non-Medical): No  Physical Activity: Inactive   Days of Exercise per Week: 0 days   Minutes of Exercise per Session: 0 min  Stress: No Stress Concern Present   Feeling of Stress : Not at all  Social Connections: Moderately Isolated   Frequency of Communication with Friends and Family: More than three times a week   Frequency of Social Gatherings with Friends and Family: More than three times a week   Attends Religious Services: More than 4 times per year   Active Member of [employer] or Organizations: No   Attends Archivist Meetings: Never   Marital Status: Widowed  Human resources officer Violence: Not At Risk   Fear of Current or Ex-Partner: No   Emotionally Abused: No   Physically Abused: No   Sexually Abused: No    REVIEW OF SYSTEMS: Constitutional: Fatigue, weakness ENT:  No nose bleeds Pulm: No productive cough. CV:  No palpitations, no LE edema.  See HPI. GU:  No hematuria, no frequency GI: See HPI. Heme: No unusual or excessive bleeding or bruising. Transfusions: Previous blood transfusions but Dr. Jannifer Franklin treated her with parenteral iron in fusions in the past to address anemia and restless leg syndrome.  Has not had any parenteral iron for over a year, her insurance stopped paying for it. Neuro:  No headaches, no peripheral tingling or numbness Derm:  No itching, no rash or sores.  Endocrine:  No sweats or chills.  No polyuria or dysuria Immunization: Reviewed.    PHYSICAL EXAM: Vital  signs in last 24 hours: Vitals:   10/29/20 0935 10/29/20 0945  BP: 133/60 140/65  Pulse: 67   Resp: 17   Temp: 98.4 F (36.9 C) 98.3 F (36.8 C)  SpO2: 97%    Wt Readings from Last 3 Encounters:  10/29/20 88.4 kg  04/22/20 82.3 kg  10/07/19 81.2 kg    General: Does not look acutely ill.  Comfortable, alert Head: Facial  asymmetry or swelling.  No signs of head trauma. Eyes: Conjunctiva pink.  No scleral icterus.  EOMI. Ears: Not hard of hearing Nose: No discharge or congestion Mouth: Good dentition.  Tongue midline.  Mucosa moist, pink, clear. Neck: No JVD, no masses, no thyromegaly Lungs: Clear bilaterally.  No labored breathing or cough. Heart: RRR. Abdomen: Soft without tenderness.  No HSM, masses, bruits, hernias.  Active bowel sounds.   Rectal: Deferred. Musc/Skeltl: No joint redness, swelling or gross deformity. Extremities: CCE. Neurologic: Alert.  Oriented x3.  Moves all 4 limbs, strength not tested.  No tremors. Skin: No rash, no sores, no telangiectasia Nodes: No cervical adenopathy Psych: Slightly anxious but not agitated, pleasant, cooperative.  Fluid speech.  Intake/Output from previous day: No intake/output data recorded. Intake/Output this shift: No intake/output data recorded.  LAB RESULTS: Recent Labs    10/28/20 1643 10/29/20 0640  WBC 3.9* 3.8*  HGB 8.6* 7.8*  HCT 30.8* 26.9*  PLT 239 208   BMET Lab Results  Component Value Date   NA 138 10/29/2020   NA 138 10/28/2020   NA 142 08/28/2019   K 3.7 10/29/2020   K 4.2 10/28/2020   K 5.4 (H) 08/28/2019   CL 107 10/29/2020   CL 107 10/28/2020   CL 104 08/28/2019   CO2 22 10/29/2020   CO2 24 10/28/2020   CO2 31 08/28/2019   GLUCOSE 84 10/29/2020   GLUCOSE 99 10/28/2020   GLUCOSE 96 08/28/2019   BUN 13 10/29/2020   BUN 18 10/28/2020   BUN 20 08/28/2019   CREATININE 0.61 10/29/2020   CREATININE 0.71 10/28/2020   CREATININE 0.72 08/28/2019   CALCIUM 8.4 (L) 10/29/2020   CALCIUM 8.3 (L) 10/28/2020   CALCIUM 9.5 08/28/2019   LFT Recent Labs    10/29/20 0640  PROT 5.3*  ALBUMIN 3.1*  AST 20  ALT 11  ALKPHOS 64  BILITOT 0.5   PT/INR Lab Results  Component Value Date   INR 1.1 10/28/2020   INR 1.09 11/01/2015   Hepatitis Panel No results for input(s): HEPBSAG, HCVAB, HEPAIGM, HEPBIGM in the last 72  hours. C-Diff No components found for: CDIFF Lipase     Component Value Date/Time   LIPASE 29.0 07/13/2009 1148    Drugs of Abuse  No results found for: LABOPIA, COCAINSCRNUR, LABBENZ, AMPHETMU, THCU, LABBARB   RADIOLOGY STUDIES: DG Chest 2 View  Result Date: 10/28/2020 CLINICAL DATA:  Cold-like symptoms for 2 weeks, fatigue EXAM: CHEST - 2 VIEW COMPARISON:  Radiograph 11/01/2015 FINDINGS: Some mild airways thickening and increasing interstitial opacity within the lungs without focal consolidative density. No pneumothorax or visible effusion. Pulmonary vascularity is normally distributed. Stable cardiomediastinal contours. No acute osseous or soft tissue abnormality. Dextrocurvature of the spine is similar to priors. IMPRESSION: Mild airways thickening increasing interstitial opacity in the lungs, can be seen with atypical/viral infections. Electronically Signed   By: Lovena Le M.D.   On: 10/28/2020 18:25   DG Chest Port 1 View  Result Date: 10/29/2020 CLINICAL DATA:  Shortness of breath in a patient who is  COVID-19 positive. EXAM: PORTABLE CHEST 1 VIEW COMPARISON:  PA and lateral chest 10/28/2020. FINDINGS: Lungs are clear. Heart size is normal. No pneumothorax or pleural fluid. No acute or focal bony abnormality. Small hiatal hernia noted. IMPRESSION: No acute disease. Electronically Signed   By: Inge Rise M.D.   On: 10/29/2020 08:24     IMPRESSION:       Microcytic anemia.  FOBT positive.   PRBC x1, awaiting follow-up Hgb.     Incidental COVID-19 positive in patient vaccinated x3 with Pf[employer] COVID-19 vaccine.  Her respiratory symptoms and malaise may certainly be due to COVID infection though chest x-ray unremarkable.      Nonexertional chest pain.  Unremarkable EKG.  history of mild CHF.      Nissen fundoplication approximately 20 years ago.  Never resolved GERD symptoms which are mostly controlled with Dexilant.   PLAN:        EGD, colonoscopy discussed with patient.   If at all possible patient would like to have these performed as an outpatient because she has a dog at home and would like to be able to schedule procedures for the future.  Also would need to be off Plavix for 5 days prior to procedure.  I told her that timing of procedures could possibly be pursued as outpatient but that it was up to the MD and patient to decide.  Favor of delaying procedures is the fact that this would give her time to recover from any COVID-related symptoms and PO quarantine.  Note that she has been started on twice daily ferrous sulfate.  Currently Protonix is 40 mg IV every 12 hours.  I am going to change this to oral formulation   Azucena Freed  10/29/2020, 10:37 AM Phone [phone]  ________________________________________________________________________  Velora Heckler [facility] MD note:  I personally examined the patient, reviewed the data and agree with the assessment and plan described above.  She has significant heme + IDA in the setting of plavix.  Two episodes of pretty clearly melenic stools in the past 12 hours or so.  She is very interested in going home and dealing with all this as an outpatient however I don't think that is such a safe idea. I discussed this with her daughter on the phone who is a Psychologist, clinical and she told me she thinks her mom is probably taking quite a bit more NSAIDs that she admits.    I recommended EGD tomorrow, if no clear source of her anemia then we will probably recommend colonoscopy while in patient.   Will allow her to eat now, will make her NPO after MN.   Owens Loffler, MD [facility] Pager [phone]

## 2020-10-29 NOTE — Progress Notes (Signed)
PROGRESS NOTE                                                                                                                                                                                                             Patient Demographics:    Shelley Coleman, is a 80 y.o. female, DOB - 1940-09-23, VZD:[id number]  Outpatient Primary MD for the patient is Dorothyann Peng, NP    LOS - 0  Admit date - 10/28/2020    Chief Complaint  Patient presents with   Fatigue       Brief Narrative (HPI from H&P)    80 year old female with past medical history of hyperlipidemia, restless leg syndrome, diastolic congestive heart failure (Echo 2017 EF 55-60%) who presents to [facility] emergency room with complaints of shortness of breath and weakness ++, she was found to be Covid +ve with symptomatic microcytic anemia, Haem Occ +ve stool   Subjective:    Shelley Coleman today has, No headache, No chest pain, No abdominal pain - No Nausea, No new weakness tingling or numbness, no SOB, ++ fatigue   Assessment  & Plan :     Progressive fatigue with exertional shortness of breath - this is most likely gradually progressive microcytic anemia along with incidental positive COVID-19.  She is Hemoccult positive, will transfuse 1 unit of packed RBC on 10/29/2020 as she has symptomatic anemia, clear liquids, IV PPI twice daily, check anemia panel, placed on oral iron and folic acid, note she has prior history of Nissen's fundoplication.  GI to evaluate.  2.  Incidental COVID-19 positive.  No cough, no shortness of breath at rest, chest x-ray stable.  Inflammatory markers stable.  Will monitor closely, encouraged the patient to sit up in chair in the daytime use I-S and flutter valve for pulmonary toiletry.  Will advance activity and titrate down oxygen as possible.  However this could have contributed to some additional fatigue.  3.  Essential  hypertension.  On beta-blocker.  4.  RLS.  Continue Requip.  5.  Chronic diastolic CHF with last EF around 55%.  Currently compensated.  Beta-blocker to continue.  SpO2: 97 %  Obesity: follow with PCP Estimated body mass index is 30.52 kg/m as calculated from the following:   Height as of this encounter: 5\' 7"  (1.702 m).   Weight as  of this encounter: 88.4 kg.          Condition - Fair  Family Communication  :  daughter Barnett Applebaum [phone] - 10/29/20  Code Status :  Full  Consults  :  GI  PUD Prophylaxis : PPI   Procedures  :            Disposition Plan  :    Status is: Observation  Dispo: The patient is from: Home              Anticipated d/c is to: Home              Patient currently is not medically stable to d/c.   Difficult to place patient No  DVT Prophylaxis  :    SCDs Start: 10/29/20 0543    Lab Results  Component Value Date   PLT 208 10/29/2020    Diet :  Diet Order             Diet clear liquid Room service appropriate? Yes; Fluid consistency: Thin  Diet effective now                    Inpatient Medications  Scheduled Meds:  sodium chloride   Intravenous Once   vitamin C  500 mg Oral Daily   atenolol  50 mg Oral Daily   pantoprazole (PROTONIX) IV  40 mg Intravenous Q12H   zinc sulfate  220 mg Oral Daily   Continuous Infusions:  lactated ringers     remdesivir 200 mg in sodium chloride 0.9% 250 mL IVPB     Followed by   Derrill Memo ON 10/30/2020] remdesivir 100 mg in NS 100 mL     PRN Meds:.acetaminophen, albuterol, ALPRAZolam, guaiFENesin-dextromethorphan, ondansetron **OR** ondansetron (ZOFRAN) IV, polyethylene glycol, rOPINIRole  Antibiotics  :    Anti-infectives (From admission, onward)    Start     Dose/Rate Route Frequency Ordered Stop   10/30/20 1000  remdesivir 100 mg in sodium chloride 0.9 % 100 mL IVPB       See Hyperspace for full Linked Orders Report.   100 mg 200 mL/hr over 30 Minutes Intravenous Daily 10/29/20  0544 11/01/20 0959   10/29/20 0700  remdesivir 200 mg in sodium chloride 0.9% 250 mL IVPB       See Hyperspace for full Linked Orders Report.   200 mg 580 mL/hr over 30 Minutes Intravenous Once 10/29/20 0544          Time Spent in minutes  30   Lala Lund M.D on 10/29/2020 at 8:44 AM  To page go to [email or web address]   [facility] -  Office  [phone]    See all Orders from today for further details    Objective:   Vitals:   10/28/20 1643 10/28/20 1939 10/28/20 2301 10/29/20 0415  BP: 108/71 127/70 (!) 148/60 (!) 144/82  Pulse: 64 64 79 66  Resp: 14 18 18 18   Temp: 98.1 F (36.7 C)   98.3 F (36.8 C)  TempSrc: Oral   Oral  SpO2: 99% 100% 93% 97%  Weight:    88.4 kg  Height:    5\' 7"  (1.702 m)    Wt Readings from Last 3 Encounters:  10/29/20 88.4 kg  04/22/20 82.3 kg  10/07/19 81.2 kg    No intake or output data in the 24 hours ending 10/29/20 0844   Physical Exam  Awake Alert, No new F.N deficits, Normal affect [place].AT,PERRAL Supple Neck,No JVD, No cervical  lymphadenopathy appriciated.  Symmetrical Chest wall movement, Good air movement bilaterally, CTAB RRR,No Gallops,Rubs or new Murmurs, No Parasternal Heave +ve B.Sounds, Abd Soft, No tenderness, No organomegaly appriciated, No rebound - guarding or rigidity. No Cyanosis, Clubbing or edema, No new Rash or bruise       Data Review:    CBC Recent Labs  Lab 10/28/20 1643 10/29/20 0640  WBC 3.9* 3.8*  HGB 8.6* 7.8*  HCT 30.8* 26.9*  PLT 239 208  MCV 75.9* 73.1*  MCH 21.2* 21.2*  MCHC 27.9* 29.0*  RDW 18.9* 18.7*  LYMPHSABS  --  1.9  MONOABS  --  0.3  EOSABS  --  0.1  BASOSABS  --  0.0    Recent Labs  Lab 10/28/20 1643 10/28/20 2355 10/29/20 0640  NA 138  --  138  K 4.2  --  3.7  CL 107  --  107  CO2 24  --  22  GLUCOSE 99  --  84  BUN 18  --  13  CREATININE 0.71  --  0.61  CALCIUM 8.3*  --  8.4*  AST  --   --  20  ALT  --   --  11  ALKPHOS  --   --  64  BILITOT  --    --  0.5  ALBUMIN  --   --  3.1*  CRP  --   --  0.5  DDIMER  --   --  0.88*  INR  --  1.1  --     ------------------------------------------------------------------------------------------------------------------ No results for input(s): CHOL, HDL, LDLCALC, TRIG, CHOLHDL, LDLDIRECT in the last 72 hours.  Lab Results  Component Value Date   HGBA1C 5.3 11/01/2015   ------------------------------------------------------------------------------------------------------------------ No results for input(s): TSH, T4TOTAL, T3FREE, THYROIDAB in the last 72 hours.  Invalid input(s): FREET3  Cardiac Enzymes No results for input(s): CKMB, TROPONINI, MYOGLOBIN in the last 168 hours.  Invalid input(s): CK ------------------------------------------------------------------------------------------------------------------ No results found for: BNP  Micro Results Recent Results (from the past 240 hour(s))  Resp Panel by RT-PCR (Flu A&B, Covid) Nasopharyngeal Swab     Status: Abnormal   Collection Time: 10/28/20 11:36 PM   Specimen: Nasopharyngeal Swab; Nasopharyngeal(NP) swabs in vial transport medium  Result Value Ref Range Status   SARS Coronavirus 2 by RT PCR POSITIVE (A) NEGATIVE Final    Comment: RESULT CALLED TO, READ BACK BY AND VERIFIED WITH: L BALDWIN RN 10/29/20 0122 JDW (NOTE) SARS-CoV-2 target nucleic acids are DETECTED.  The SARS-CoV-2 RNA is generally detectable in upper respiratory specimens during the acute phase of infection. Positive results are indicative of the presence of the identified virus, but do not rule out bacterial infection or co-infection with other pathogens not detected by the test. Clinical correlation with patient history and other diagnostic information is necessary to determine patient infection status. The expected result is Negative.  Fact Sheet for Patients: EntrepreneurPulse.com.au  Fact Sheet for Healthcare  Providers: IncredibleEmployment.be  This test is not yet approved or cleared by the [place] FDA and  has been authorized for detection and/or diagnosis of SARS-CoV-2 by FDA under an Emergency Use Authorization (EUA).  This EUA will remain in effect (meaning this test can be used ) for the duration of  the COVID-19 declaration under Section 564(b)(1) of the Act, 21 U.S.C. section 360bbb-3(b)(1), unless the authorization is terminated or revoked sooner.     Influenza A by PCR NEGATIVE NEGATIVE Final   Influenza B by PCR NEGATIVE NEGATIVE Final  Comment: (NOTE) The Xpert Xpress SARS-CoV-2/FLU/RSV plus assay is intended as an aid in the diagnosis of influenza from Nasopharyngeal swab specimens and should not be used as a sole basis for treatment. Nasal washings and aspirates are unacceptable for Xpert Xpress SARS-CoV-2/FLU/RSV testing.  Fact Sheet for Patients: EntrepreneurPulse.com.au  Fact Sheet for Healthcare Providers: IncredibleEmployment.be  This test is not yet approved or cleared by the [place] FDA and has been authorized for detection and/or diagnosis of SARS-CoV-2 by FDA under an Emergency Use Authorization (EUA). This EUA will remain in effect (meaning this test can be used) for the duration of the COVID-19 declaration under Section 564(b)(1) of the Act, 21 U.S.C. section 360bbb-3(b)(1), unless the authorization is terminated or revoked.  Performed at [facility], [place] [address] [place], [place] [address]     Radiology Reports DG Chest 2 View  Result Date: 10/28/2020 CLINICAL DATA:  Cold-like symptoms for 2 weeks, fatigue EXAM: CHEST - 2 VIEW COMPARISON:  Radiograph 11/01/2015 FINDINGS: Some mild airways thickening and increasing interstitial opacity within the lungs without focal consolidative density. No pneumothorax or visible effusion. Pulmonary vascularity is normally distributed. Stable  cardiomediastinal contours. No acute osseous or soft tissue abnormality. Dextrocurvature of the spine is similar to priors. IMPRESSION: Mild airways thickening increasing interstitial opacity in the lungs, can be seen with atypical/viral infections. Electronically Signed   By: Lovena Le M.D.   On: 10/28/2020 18:25   DG Chest Port 1 View  Result Date: 10/29/2020 CLINICAL DATA:  Shortness of breath in a patient who is COVID-19 positive. EXAM: PORTABLE CHEST 1 VIEW COMPARISON:  PA and lateral chest 10/28/2020. FINDINGS: Lungs are clear. Heart size is normal. No pneumothorax or pleural fluid. No acute or focal bony abnormality. Small hiatal hernia noted. IMPRESSION: No acute disease. Electronically Signed   By: Inge Rise M.D.   On: 10/29/2020 08:24

## 2020-10-29 NOTE — H&P (Signed)
History and Physical    Shelley Coleman CBJ:[id number] DOB: 1941-03-26 DOA: 10/28/2020  PCP: Dorothyann Peng, NP  Patient coming from: Home   Chief Complaint:  Chief Complaint  Patient presents with   Fatigue     HPI:    80 year old female with past medical history of hyperlipidemia, restless leg syndrome, diastolic congestive heart failure (Echo 2017 EF 55-60%) who presents to [facility] emergency room with complaints of shortness of breath and weakness.  Patient explains that slightly over 1 week ago she began to experience generalized fatigue.  Fatigue was initially mild in intensity but progressively became more more severe.  Fatigue was associated with shortness of breath.  Fatigue and shortness of breath worsened with exertion and improved with rest.  Patient also complains of associated poor appetite.  On Monday, patient began to develop fevers of at least 101.7 degrees that would occur in the evenings with intermittent chills.  Patient symptoms continue to worsen to the point where patient would feel extremely lightheaded with any attempt at rising from a seated position or remaining standing for any length of time.  Of note, patient does admit to regular ibuprofen use in combination with her daily Plavix therapy.  Patient denies melena, hematochezia, hematemesis, heavy bruising or hematuria.  Patient denies having any associated abdominal pain.  Upon further questioning patient denies sick contacts recent travel or contact with confirmed COVID-19 infection.  Patient denies regular alcohol use.  Due to patient's progressively worsening symptoms the patient eventually presented to [facility] emergency department for evaluation.  Upon evaluation in the emergency department patient was found to have a hemoglobin of 8.6, a substantial decrease from 13.7 approximately 1 year ago.  Patient was additionally found to have specific gravity on urinalysis of greater than 1.030  concerning for volume depletion.  Chest x-ray was performed revealing interstitial bilateral infiltrates concerning for viral illness.  COVID-19 PCR testing was positive.  The hospitalist group was then called to assess patient for admission to the hospital.   Review of Systems:   Review of Systems  Constitutional:  Positive for chills, fever and malaise/fatigue.  Neurological:  Positive for weakness.   Past Medical History:  Diagnosis Date   Anemia    Has had iron infusions   Breast cancer ([place])    right   Cerebrovascular disease 11/12/2018   Depression    Depression    GERD (gastroesophageal reflux disease)    Hiatal hernia    History of GI bleed    Hx of mitral valve prolapse    Hyperlipidemia    Irregular heart beat    Neuropathy    numbness in both feet   Nocturnal leg cramps 09/02/2015   Optic neuritis    left   Osteopenia    Ovarian cyst    Parotid tumor    right   Pelvic fracture (HCC)    Restless legs syndrome    Shingles 2000   Left in V1   Stroke [facility])     Past Surgical History:  Procedure Laterality Date   ABDOMINAL HYSTERECTOMY     APPENDECTOMY     BREAST LUMPECTOMY Left    CATARACT EXTRACTION     ESOPHAGEAL MANOMETRY N/A 01/13/2013   Procedure: ESOPHAGEAL MANOMETRY (EM);  Surgeon: Sable Feil, MD;  Location: [facility] ENDOSCOPY;  Service: Endoscopy;  Laterality: N/A;   HERNIA REPAIR  08/2009   MASTECTOMY Right    SPINE SURGERY     TOTAL HIP ARTHROPLASTY Left  reports that she has never smoked. She has never used smokeless tobacco. She reports current alcohol use of about 1.0 standard drink of alcohol per week. She reports that she does not use drugs.  Allergies  Allergen Reactions   Diflunisal     REACTION: rash , swelling  anaphlaxsis   Lipitor [Atorvastatin]     Pt had body joint pain   Statins     Myalgia     Tetanus Toxoid     REACTION: arm swelling, fever    Family History  Problem Relation Age of Onset   Cancer Mother         Angio sarcoma    Heart disease Father    Hypertension Father    Stroke Father        x2   Heart attack Father        x 11   Hernia Brother    Hypertension Brother      Prior to Admission medications   Medication Sig Start Date End Date Taking? Authorizing Provider  acetaminophen (TYLENOL) 500 MG tablet Take 1,000 mg by mouth every 6 (six) hours as needed for moderate pain or headache.   Yes [provider]  ALPRAZolam (XANAX) 1 MG tablet TAKE 1 TABLET BY MOUTH AT BEDTIME AS NEEDED Patient taking differently: Take 1 mg by mouth at bedtime as needed for sleep. 03/04/20  Yes Nafziger, Tommi Rumps, NP  atenolol (TENORMIN) 50 MG tablet TAKE 1 TABLET BY MOUTH ONCE DAILY . APPOINTMENT REQUIRED FOR FUTURE REFILLS Patient taking differently: Take 50 mg by mouth daily. 10/18/20  Yes Nafziger, Tommi Rumps, NP  clopidogrel (PLAVIX) 75 MG tablet Take 75 mg by mouth daily. 08/24/20  Yes [provider]  dexlansoprazole (DEXILANT) 60 MG capsule Take 1 capsule (60 mg total) by mouth daily. 09/23/20  Yes Nafziger, Tommi Rumps, NP  hydrochlorothiazide (HYDRODIURIL) 25 MG tablet Take 1 tablet (25 mg total) by mouth daily. Patient taking differently: Take 25 mg by mouth daily as needed (fluid). 04/28/16  Yes Nafziger, Tommi Rumps, NP  ibuprofen (ADVIL) 200 MG tablet Take 400 mg by mouth every 6 (six) hours as needed for headache or moderate pain.   Yes [provider]  Multiple Vitamins-Minerals (CENTRUM SILVER 50+WOMEN) TABS Take 1 tablet by mouth daily.   Yes [provider]  Multiple Vitamins-Minerals (PRESERVISION AREDS 2 PO) Take 1 tablet by mouth in the morning and at bedtime.   Yes [provider]  nitroGLYCERIN (NITROSTAT) 0.4 MG SL tablet PLACE ONE TABLET UNDER THE TONGUE EVERY 5 MINUTES AS NEEDED. Patient taking differently: Place 0.4 mg under the tongue every 5 (five) minutes as needed for chest pain. 10/10/16  Yes Nafziger, Tommi Rumps, NP  Polyethyl Glycol-Propyl Glycol (SYSTANE OP) Place 1  drop into both eyes daily as needed (dry eyes).   Yes [provider]  rOPINIRole (REQUIP) 3 MG tablet Take 3 mg by mouth at bedtime as needed (rls).   Yes [provider]  vitamin B-12 (CYANOCOBALAMIN) 500 MCG tablet Take 500 mcg by mouth daily.   Yes [provider]  amoxicillin (AMOXIL) 500 MG capsule Take 500 mg by mouth See admin instructions. Bid x 10 days Patient not taking: Reported on 10/29/2020 10/11/20   [provider]  ciprofloxacin (CIPRO) 500 MG tablet Take 500 mg by mouth 2 (two) times daily. Prescribed on 04/21/20 for seven days - UTI. Patient not taking: No sig reported    [provider]  fluticasone (FLONASE) 50 MCG/ACT nasal spray Place 2  sprays into both nostrils daily. Patient not taking: No sig reported 02/27/19   Dorothyann Peng, NP    Physical Exam: Vitals:   10/28/20 1643 10/28/20 1939 10/28/20 2301 10/29/20 0415  BP: 108/71 127/70 (!) 148/60 (!) 144/82  Pulse: 64 64 79 66  Resp: 14 18 18 18   Temp: 98.1 F (36.7 C)   98.3 F (36.8 C)  TempSrc: Oral   Oral  SpO2: 99% 100% 93% 97%  Weight:    88.4 kg  Height:    5\' 7"  (1.702 m)    Constitutional: Awake alert and oriented x3, no associated distress.   Skin: no rashes, no lesions, good skin turgor noted. Eyes: Pupils are equally reactive to light.  No evidence of scleral icterus or conjunctival pallor.  ENMT: Moist mucous membranes noted.  Posterior pharynx clear of any exudate or lesions.   Neck: normal, supple, no masses, no thyromegaly.  No evidence of jugular venous distension.   Respiratory: Faint bibasilar rales noted., no wheezing, no crackles. Normal respiratory effort. No accessory muscle use.  Cardiovascular: Regular rate and rhythm, no murmurs / rubs / gallops. No extremity edema. 2+ pedal pulses. No carotid bruits.  Chest:   Nontender without crepitus or deformity.   Back:   Nontender without crepitus or deformity. Abdomen: Abdomen is soft and nontender.   No evidence of intra-abdominal masses.  Positive bowel sounds noted in all quadrants.   Musculoskeletal: No joint deformity upper and lower extremities. Good ROM, no contractures. Normal muscle tone.  Neurologic: CN 2-12 grossly intact. Sensation intact.  Patient moving all 4 extremities spontaneously.  Patient is following all commands.  Patient is responsive to verbal stimuli.   Psychiatric: Patient exhibits normal mood with appropriate affect.  Patient seems to possess insight as to their current situation.     Labs on Admission: I have personally reviewed following labs and imaging studies -   CBC: Recent Labs  Lab 10/28/20 1643  WBC 3.9*  HGB 8.6*  HCT 30.8*  MCV 75.9*  PLT 426   Basic Metabolic Panel: Recent Labs  Lab 10/28/20 1643  NA 138  K 4.2  CL 107  CO2 24  GLUCOSE 99  BUN 18  CREATININE 0.71  CALCIUM 8.3*   GFR: Estimated Creatinine Clearance: 66.1 mL/min (by C-G formula based on SCr of 0.71 mg/dL). Liver Function Tests: No results for input(s): AST, ALT, ALKPHOS, BILITOT, PROT, ALBUMIN in the last 168 hours. No results for input(s): LIPASE, AMYLASE in the last 168 hours. No results for input(s): AMMONIA in the last 168 hours. Coagulation Profile: Recent Labs  Lab 10/28/20 2355  INR 1.1   Cardiac Enzymes: No results for input(s): CKTOTAL, CKMB, CKMBINDEX, TROPONINI in the last 168 hours. BNP (last 3 results) No results for input(s): PROBNP in the last 8760 hours. HbA1C: No results for input(s): HGBA1C in the last 72 hours. CBG: No results for input(s): GLUCAP in the last 168 hours. Lipid Profile: No results for input(s): CHOL, HDL, LDLCALC, TRIG, CHOLHDL, LDLDIRECT in the last 72 hours. Thyroid Function Tests: No results for input(s): TSH, T4TOTAL, FREET4, T3FREE, THYROIDAB in the last 72 hours. Anemia Panel: No results for input(s): VITAMINB12, FOLATE, FERRITIN, TIBC, IRON, RETICCTPCT in the last 72 hours. Urine analysis:    Component Value  Date/Time   COLORURINE YELLOW 10/28/2020 2101   APPEARANCEUR CLEAR 10/28/2020 2101   LABSPEC >1.030 (H) 10/28/2020 2101   PHURINE 6.0 10/28/2020 2101   GLUCOSEU NEGATIVE 10/28/2020 2101   GLUCOSEU NEGATIVE 08/28/2019  [place] 10/28/2020 2101   HGBUR trace-lysed 01/15/2009 0750   BILIRUBINUR NEGATIVE 10/28/2020 2101   BILIRUBINUR 1+ 10/07/2019 1406   [place] 10/28/2020 2101   PROTEINUR NEGATIVE 10/28/2020 2101   UROBILINOGEN 0.2 10/07/2019 1406   UROBILINOGEN 0.2 08/28/2019 1206   NITRITE POSITIVE (A) 10/28/2020 2101   LEUKOCYTESUR NEGATIVE 10/28/2020 2101    Radiological Exams on Admission - Personally Reviewed: DG Chest 2 View  Result Date: 10/28/2020 CLINICAL DATA:  Cold-like symptoms for 2 weeks, fatigue EXAM: CHEST - 2 VIEW COMPARISON:  Radiograph 11/01/2015 FINDINGS: Some mild airways thickening and increasing interstitial opacity within the lungs without focal consolidative density. No pneumothorax or visible effusion. Pulmonary vascularity is normally distributed. Stable cardiomediastinal contours. No acute osseous or soft tissue abnormality. Dextrocurvature of the spine is similar to priors. IMPRESSION: Mild airways thickening increasing interstitial opacity in the lungs, can be seen with atypical/viral infections. Electronically Signed   By: Lovena Le M.D.   On: 10/28/2020 18:25    EKG: Personally reviewed.  Rhythm is normal sinus rhythm with heart rate of 67 bpm.  No dynamic ST segment changes appreciated.  Assessment/Plan Principal Problem:   COVID-19 virus infection  Patient presenting with over 1 week history of progressive shortness of breath, weakness, lethargy and poor appetite with several day history of associated fevers COVID-19 PCR testing found to be positive in the emergency department.  Chest x-ray found to have mild bilateral interstitial infiltrates consistent with COVID-19 infection. COVID-19 illness and associated dehydration are  likely the cause of the patient's acute weakness in the past week or so. Since patient is not hypoxic, will only provide patient with a 3-day course of intravenous remdesivir Will abstain from initiating steroids at this time Hydrating patient gently with intravenous isotonic fluids As needed antitussives for associated cough As needed bronchodilator therapy for shortness of breath or wheezing Zinc and vitamin C segmentation Airborne and contact precautions  Active Problems:   Microcytic anemia  Prior to this patient's dramatic development of severe weakness in the past week, patient complains of a more slow progressive weakness and fatigue that has been developing for the past several months.   CBC reveals a hemoglobin 8.6 with evidence of microcytosis Patient reports longstanding regular ibuprofen use in combination with daily antiplatelet therapy Stool Hemoccult was found to be positive here in the emergency department. I believe that the patient is suffering from a degree of relatively slow chronic bleeding that is leading to a gradual worsening fatigue and this patient prior to this COVID infection. That being said, considering patient is now COVID-positive I believe this anemia should be managed conservatively with watchful waiting and serial CBCs Patient has additionally been placed on Protonix 40 mg IV every 12 which can be switched to oral therapy at time of discharge. Patient will be educated to stop using NSAIDs If serial CBCs reveal a downtrending hemoglobin and we can formally consult gastroenterology to consider endoscopic work-up while hospitalized.  Otherwise, this should be performed as an outpatient. Iron panel, folate, vitamin B12, reticulocyte count ordered.    Postural dizziness with presyncope  Likely secondary to volume depletion and relative anemia Hydrating patient with intravenous isotonic fluids Will obtain orthostatic vital signs Monitoring patient on  telemetry    GERD (gastroesophageal reflux disease)  Please see mention of use of PPI above.    Essential hypertension  Atenolol daily    Chronic diastolic CHF (congestive heart failure) (HCC)  No evidence of cardiogenic volume  overload at this time Monitoring patient closely with intravenous volume resuscitation    Restless legs syndrome (RLS)  Continue home regimen of Requip.   Code Status:  Full code Family Communication: deferred   Status is: Observation  The patient remains OBS appropriate and will d/c before 2 midnights.  Dispo: The patient is from: Home              Anticipated d/c is to: Home              Patient currently is not medically stable to d/c.   Difficult to place patient No        Vernelle Emerald MD [facility] Pager [phone]  If 7PM-7AM, please contact night-coverage [email or web address] Use universal [place] password for that web site. If you do not have the password, please call the hospital operator.  10/29/2020, 5:44 AM

## 2020-10-29 NOTE — Consult Note (Addendum)
[facility] Consult: 10:37 AM 10/29/2020  LOS: 0 days    Referring Provider: Dr Ronnie Derby  Primary Care Physician:  Dorothyann Peng, NP Primary Gastroenterologist:  Dr. Verl Blalock     Reason for Consultation:  anemia.     HPI: Shelley Coleman is a 80 y.o. female.  PMH CVA, chronic Plavix.  HH, GERD, fundoplication early 6761P.  Breast cancer, mastectomy, chemotherapy with resulting peripheral neuropathy.  Left kidney hemorrhagic/proteinaceous cyst, 2, tiny, < 5 MM, enhancing foci in right liver, TSTC and hepatic steatosis on MR abdomen in 03/2017.  Mild CHF, EF 55 to 60% in 2017.  Latest endoscopic procedures include:  06/2009 colonoscopy.  For average risk screening and complaint of abdominal pain.  Melanosis coli, otherwise normal study 11/2012 EGD.  For epigastric pain, dysphagia.  Previous fundoplication.  Findings were of mild antral gastropathy probably from NSAIDs.  Intact esophageal fundoplication.  Some nonbleeding erosions at distal esophagus.  Esophagus dilated with #52 Isabell Jarvis.  Otherwise normal study to second duodenum.  Biopsies obtained for CLO testing.  Several weeks of dyspnea, nonexertional chest pain.  Couple of weeks progressive fatigue, upper respiratory symptoms.  More recent onset presyncope with lightheaded/dizziness.  Anorexia.  On Monday she had loose stools.  She had watery loose, nonbloody stools yesterday and again today.  Stools today were darker but not tarry or black.  With the loose stools she had fecal urgency associated with abdominal cramping but no persistent cramps or abdominal pain.  Fever 101.48F, chills 4 days prior to arrival.  Uses ibuprofen as needed.  In the last 2 weeks has used maybe 800 mg.  Reflux symptoms mostly controlled with Dexilant but occasional breakthrough  symptoms.  Occasional pill dysphagia.  COVID-19 positive.  She has been vaccinated and boosted with [employer] COVID-19 vaccine. EKG unremarkable Hgb 7.8.  Was 13.7 foourteen months ago.   MCV 73.  Platelets 208.  Normal BUN/creatinine.  Iron 22, low.  Iron sats 5%, low.  No ferritin level.  Folate and B12 normal. CXR with clear lungs, normal heart.  Small HH.    Past Medical History:  Diagnosis Date   Anemia    Has had iron infusions   Breast cancer ([place])    right   Cerebrovascular disease 11/12/2018   Depression    Depression    GERD (gastroesophageal reflux disease)    Hiatal hernia    History of GI bleed    Hx of mitral valve prolapse    Hyperlipidemia    Irregular heart beat    Neuropathy    numbness in both feet   Nocturnal leg cramps 09/02/2015   Optic neuritis    left   Osteopenia    Ovarian cyst    Parotid tumor    right   Pelvic fracture (H[facility])    Restless legs syndrome    Shingles 2000   Left in V1   Stroke [facility])     Past Surgical History:  Procedure Laterality Date   ABDOMINAL HYSTERECTOMY     APPENDECTOMY  BREAST LUMPECTOMY Left    CATARACT EXTRACTION     ESOPHAGEAL MANOMETRY N/A 01/13/2013   Procedure: ESOPHAGEAL MANOMETRY (EM);  Surgeon: Sable Feil, MD;  Location: [facility];  Service: Endoscopy;  Laterality: N/A;   HERNIA REPAIR  08/2009   MASTECTOMY Right    SPINE SURGERY     TOTAL HIP ARTHROPLASTY Left     Prior to Admission medications   Medication Sig Start Date End Date Taking? Authorizing Provider  acetaminophen (TYLENOL) 500 MG tablet Take 1,000 mg by mouth every 6 (six) hours as needed for moderate pain or headache.   Yes [provider]  ALPRAZolam (XANAX) 1 MG tablet TAKE 1 TABLET BY MOUTH AT BEDTIME AS NEEDED Patient taking differently: Take 1 mg by mouth at bedtime as needed for sleep. 03/04/20  Yes Nafziger, Tommi Rumps, NP  atenolol (TENORMIN) 50 MG tablet TAKE 1 TABLET BY MOUTH ONCE DAILY . APPOINTMENT REQUIRED FOR FUTURE  REFILLS Patient taking differently: Take 50 mg by mouth daily. 10/18/20  Yes Nafziger, Tommi Rumps, NP  clopidogrel (PLAVIX) 75 MG tablet Take 75 mg by mouth daily. 08/24/20  Yes [provider]  dexlansoprazole (DEXILANT) 60 MG capsule Take 1 capsule (60 mg total) by mouth daily. 09/23/20  Yes Nafziger, Tommi Rumps, NP  hydrochlorothiazide (HYDRODIURIL) 25 MG tablet Take 1 tablet (25 mg total) by mouth daily. Patient taking differently: Take 25 mg by mouth daily as needed (fluid). 04/28/16  Yes Nafziger, Tommi Rumps, NP  ibuprofen (ADVIL) 200 MG tablet Take 400 mg by mouth every 6 (six) hours as needed for headache or moderate pain.   Yes [provider]  Multiple Vitamins-Minerals (CENTRUM SILVER 50+WOMEN) TABS Take 1 tablet by mouth daily.   Yes [provider]  Multiple Vitamins-Minerals (PRESERVISION AREDS 2 PO) Take 1 tablet by mouth in the morning and at bedtime.   Yes [provider]  nitroGLYCERIN (NITROSTAT) 0.4 MG SL tablet PLACE ONE TABLET UNDER THE TONGUE EVERY 5 MINUTES AS NEEDED. Patient taking differently: Place 0.4 mg under the tongue every 5 (five) minutes as needed for chest pain. 10/10/16  Yes Nafziger, Tommi Rumps, NP  Polyethyl Glycol-Propyl Glycol (SYSTANE OP) Place 1 drop into both eyes daily as needed (dry eyes).   Yes [provider]  rOPINIRole (REQUIP) 3 MG tablet Take 3 mg by mouth at bedtime as needed (rls).   Yes [provider]  vitamin B-12 (CYANOCOBALAMIN) 500 MCG tablet Take 500 mcg by mouth daily.   Yes [provider]  amoxicillin (AMOXIL) 500 MG capsule Take 500 mg by mouth See admin instructions. Bid x 10 days Patient not taking: Reported on 10/29/2020 10/11/20   [provider]  ciprofloxacin (CIPRO) 500 MG tablet Take 500 mg by mouth 2 (two) times daily. Prescribed on 04/21/20 for seven days - UTI. Patient not taking: No sig reported    [provider]  fluticasone (FLONASE) 50 MCG/ACT nasal spray Place 2 sprays  into both nostrils daily. Patient not taking: No sig reported 02/27/19   Dorothyann Peng, NP    Scheduled Meds:  vitamin C  500 mg Oral Daily   atenolol  50 mg Oral Daily   ferrous sulfate  325 mg Oral BID WC   folic acid  1 mg Oral Daily   pantoprazole (PROTONIX) IV  40 mg Intravenous Q12H   zinc sulfate  220 mg Oral Daily   Infusions:  lactated ringers 75 mL/hr at 10/29/20 0925   [START ON 10/30/2020] remdesivir 100 mg in NS 100  mL     PRN Meds: acetaminophen, albuterol, ALPRAZolam, guaiFENesin-dextromethorphan, ondansetron **OR** ondansetron (ZOFRAN) IV, polyethylene glycol, rOPINIRole   Allergies as of 10/28/2020 - Review Complete 10/28/2020  Allergen Reaction Noted   Diflunisal  03/07/2007   Lipitor [atorvastatin]  09/13/2016   Statins  03/13/2018   Tetanus toxoid  03/07/2007    Family History  Problem Relation Age of Onset   Cancer Mother        Angio sarcoma    Heart disease Father    Hypertension Father    Stroke Father        x2   Heart attack Father        x 11   Hernia Brother    Hypertension Brother     Social History   Socioeconomic History   Marital status: Married    Spouse name: Not on file   Number of children: 2   Years of education: 16   Highest education level: Not on file  Occupational History   Occupation: Retired Therapist, sports  Tobacco Use   Smoking status: Never   Smokeless tobacco: Never  Substance and Sexual Activity   Alcohol use: Yes    Alcohol/week: 1.0 standard drink    Types: 1 Glasses of wine per week    Comment: Rarely,socially   Drug use: No   Sexual activity: Not on file  Other Topics Concern   Not on file  Social History Narrative   Retired from nursing    Widowed    Left-handed      Social Determinants of Radio broadcast assistant Strain: Low Risk    Difficulty of Paying Living Expenses: Not hard at all  Food Insecurity: No Food Insecurity   Worried About Charity fundraiser in the Last Year: Never true   Academic librarian in the Last Year: Never true  Transportation Needs: No Transportation Needs   Lack of Transportation (Medical): No   Lack of Transportation (Non-Medical): No  Physical Activity: Inactive   Days of Exercise per Week: 0 days   Minutes of Exercise per Session: 0 min  Stress: No Stress Concern Present   Feeling of Stress : Not at all  Social Connections: Moderately Isolated   Frequency of Communication with Friends and Family: More than three times a week   Frequency of Social Gatherings with Friends and Family: More than three times a week   Attends Religious Services: More than 4 times per year   Active Member of [employer] or Organizations: No   Attends Archivist Meetings: Never   Marital Status: Widowed  Human resources officer Violence: Not At Risk   Fear of Current or Ex-Partner: No   Emotionally Abused: No   Physically Abused: No   Sexually Abused: No    REVIEW OF SYSTEMS: Constitutional: Fatigue, weakness ENT:  No nose bleeds Pulm: No productive cough. CV:  No palpitations, no LE edema.  See HPI. GU:  No hematuria, no frequency GI: See HPI. Heme: No unusual or excessive bleeding or bruising. Transfusions: Previous blood transfusions but Dr. Jannifer Franklin treated her with parenteral iron in fusions in the past to address anemia and restless leg syndrome.  Has not had any parenteral iron for over a year, her insurance stopped paying for it. Neuro:  No headaches, no peripheral tingling or numbness Derm:  No itching, no rash or sores.  Endocrine:  No sweats or chills.  No polyuria or dysuria Immunization: Reviewed.    PHYSICAL EXAM: Vital  signs in last 24 hours: Vitals:   10/29/20 0935 10/29/20 0945  BP: 133/60 140/65  Pulse: 67   Resp: 17   Temp: 98.4 F (36.9 C) 98.3 F (36.8 C)  SpO2: 97%    Wt Readings from Last 3 Encounters:  10/29/20 88.4 kg  04/22/20 82.3 kg  10/07/19 81.2 kg    General: Does not look acutely ill.  Comfortable, alert Head: Facial  asymmetry or swelling.  No signs of head trauma. Eyes: Conjunctiva pink.  No scleral icterus.  EOMI. Ears: Not hard of hearing Nose: No discharge or congestion Mouth: Good dentition.  Tongue midline.  Mucosa moist, pink, clear. Neck: No JVD, no masses, no thyromegaly Lungs: Clear bilaterally.  No labored breathing or cough. Heart: RRR. Abdomen: Soft without tenderness.  No HSM, masses, bruits, hernias.  Active bowel sounds.   Rectal: Deferred. Musc/Skeltl: No joint redness, swelling or gross deformity. Extremities: CCE. Neurologic: Alert.  Oriented x3.  Moves all 4 limbs, strength not tested.  No tremors. Skin: No rash, no sores, no telangiectasia Nodes: No cervical adenopathy Psych: Slightly anxious but not agitated, pleasant, cooperative.  Fluid speech.  Intake/Output from previous day: No intake/output data recorded. Intake/Output this shift: No intake/output data recorded.  LAB RESULTS: Recent Labs    10/28/20 1643 10/29/20 0640  WBC 3.9* 3.8*  HGB 8.6* 7.8*  HCT 30.8* 26.9*  PLT 239 208   BMET Lab Results  Component Value Date   NA 138 10/29/2020   NA 138 10/28/2020   NA 142 08/28/2019   K 3.7 10/29/2020   K 4.2 10/28/2020   K 5.4 (H) 08/28/2019   CL 107 10/29/2020   CL 107 10/28/2020   CL 104 08/28/2019   CO2 22 10/29/2020   CO2 24 10/28/2020   CO2 31 08/28/2019   GLUCOSE 84 10/29/2020   GLUCOSE 99 10/28/2020   GLUCOSE 96 08/28/2019   BUN 13 10/29/2020   BUN 18 10/28/2020   BUN 20 08/28/2019   CREATININE 0.61 10/29/2020   CREATININE 0.71 10/28/2020   CREATININE 0.72 08/28/2019   CALCIUM 8.4 (L) 10/29/2020   CALCIUM 8.3 (L) 10/28/2020   CALCIUM 9.5 08/28/2019   LFT Recent Labs    10/29/20 0640  PROT 5.3*  ALBUMIN 3.1*  AST 20  ALT 11  ALKPHOS 64  BILITOT 0.5   PT/INR Lab Results  Component Value Date   INR 1.1 10/28/2020   INR 1.09 11/01/2015   Hepatitis Panel No results for input(s): HEPBSAG, HCVAB, HEPAIGM, HEPBIGM in the last 72  hours. C-Diff No components found for: CDIFF Lipase     Component Value Date/Time   LIPASE 29.0 07/13/2009 1148    Drugs of Abuse  No results found for: LABOPIA, COCAINSCRNUR, LABBENZ, AMPHETMU, THCU, LABBARB   RADIOLOGY STUDIES: DG Chest 2 View  Result Date: 10/28/2020 CLINICAL DATA:  Cold-like symptoms for 2 weeks, fatigue EXAM: CHEST - 2 VIEW COMPARISON:  Radiograph 11/01/2015 FINDINGS: Some mild airways thickening and increasing interstitial opacity within the lungs without focal consolidative density. No pneumothorax or visible effusion. Pulmonary vascularity is normally distributed. Stable cardiomediastinal contours. No acute osseous or soft tissue abnormality. Dextrocurvature of the spine is similar to priors. IMPRESSION: Mild airways thickening increasing interstitial opacity in the lungs, can be seen with atypical/viral infections. Electronically Signed   By: Lovena Le M.D.   On: 10/28/2020 18:25   DG Chest Port 1 View  Result Date: 10/29/2020 CLINICAL DATA:  Shortness of breath in a patient who is  COVID-19 positive. EXAM: PORTABLE CHEST 1 VIEW COMPARISON:  PA and lateral chest 10/28/2020. FINDINGS: Lungs are clear. Heart size is normal. No pneumothorax or pleural fluid. No acute or focal bony abnormality. Small hiatal hernia noted. IMPRESSION: No acute disease. Electronically Signed   By: Inge Rise M.D.   On: 10/29/2020 08:24     IMPRESSION:       Microcytic anemia.  FOBT positive.   PRBC x1, awaiting follow-up Hgb.     Incidental COVID-19 positive in patient vaccinated x3 with Pf[employer] COVID-19 vaccine.  Her respiratory symptoms and malaise may certainly be due to COVID infection though chest x-ray unremarkable.      Nonexertional chest pain.  Unremarkable EKG.  history of mild CHF.      Nissen fundoplication approximately 20 years ago.  Never resolved GERD symptoms which are mostly controlled with Dexilant.   PLAN:        EGD, colonoscopy discussed with patient.   If at all possible patient would like to have these performed as an outpatient because she has a dog at home and would like to be able to schedule procedures for the future.  Also would need to be off Plavix for 5 days prior to procedure.  I told her that timing of procedures could possibly be pursued as outpatient but that it was up to the MD and patient to decide.  Favor of delaying procedures is the fact that this would give her time to recover from any COVID-related symptoms and PO quarantine.  Note that she has been started on twice daily ferrous sulfate.  Currently Protonix is 40 mg IV every 12 hours.  I am going to change this to oral formulation   Azucena Freed  10/29/2020, 10:37 AM Phone [phone]  ________________________________________________________________________  [facility] MD note:  I personally examined the patient, reviewed the data and agree with the assessment and plan described above.  She has significant heme + IDA in the setting of plavix.  Two episodes of pretty clearly melenic stools in the past 12 hours or so.  She is very interested in going home and dealing with all this as an outpatient however I don't think that is such a safe idea. I discussed this with her daughter on the phone who is a Psychologist, clinical and she told me she thinks her mom is probably taking quite a bit more NSAIDs that she admits.    I recommended EGD tomorrow, if no clear source of her anemia then we will probably recommend colonoscopy while in patient.   Will allow her to eat now, will make her NPO after MN.   Owens Loffler, MD [facility] Pager [phone]

## 2020-10-29 NOTE — ED Notes (Signed)
Attempted to call report to floor. Spoke with Elmyra Ricks, states she will review patient and to call back shortly for report.

## 2020-10-30 ENCOUNTER — Encounter (HOSPITAL_COMMUNITY)
Admission: EM | Disposition: A | Discharge status: Home / Self Care | Payer: Self-pay | Source: Home / Self Care | Attending: Internal Medicine

## 2020-10-30 ENCOUNTER — Inpatient Hospital Stay (HOSPITAL_COMMUNITY): Payer: Medicare Other | Admitting: Certified Registered Nurse Anesthetist

## 2020-10-30 ENCOUNTER — Encounter (HOSPITAL_COMMUNITY): Payer: Self-pay | Admitting: Internal Medicine

## 2020-10-30 DIAGNOSIS — D500 Iron deficiency anemia secondary to blood loss (chronic): Secondary | ICD-10-CM

## 2020-10-30 DIAGNOSIS — K449 Diaphragmatic hernia without obstruction or gangrene: Secondary | ICD-10-CM

## 2020-10-30 DIAGNOSIS — D649 Anemia, unspecified: Secondary | ICD-10-CM

## 2020-10-30 DIAGNOSIS — K31819 Angiodysplasia of stomach and duodenum without bleeding: Secondary | ICD-10-CM

## 2020-10-30 DIAGNOSIS — T18128A Food in esophagus causing other injury, initial encounter: Secondary | ICD-10-CM

## 2020-10-30 DIAGNOSIS — K921 Melena: Secondary | ICD-10-CM

## 2020-10-30 DIAGNOSIS — K259 Gastric ulcer, unspecified as acute or chronic, without hemorrhage or perforation: Secondary | ICD-10-CM

## 2020-10-30 HISTORY — PX: HOT HEMOSTASIS: SHX5433

## 2020-10-30 HISTORY — PX: ESOPHAGOGASTRODUODENOSCOPY (EGD) WITH PROPOFOL: SHX5813

## 2020-10-30 HISTORY — PX: FOREIGN BODY REMOVAL: SHX962

## 2020-10-30 LAB — CBC WITH DIFFERENTIAL/PLATELET
Abs Immature Granulocytes: 0 10*3/uL (ref 0.00–0.07)
Basophils Absolute: 0 10*3/uL (ref 0.0–0.1)
Basophils Relative: 1 %
Eosinophils Absolute: 0.1 10*3/uL (ref 0.0–0.5)
Eosinophils Relative: 2 %
HCT: 31.1 % — ABNORMAL LOW (ref 36.0–46.0)
Hemoglobin: 9.4 g/dL — ABNORMAL LOW (ref 12.0–15.0)
Immature Granulocytes: 0 %
Lymphocytes Relative: 49 %
Lymphs Abs: 1.5 10*3/uL (ref 0.7–4.0)
MCH: 22.4 pg — ABNORMAL LOW (ref 26.0–34.0)
MCHC: 30.2 g/dL (ref 30.0–36.0)
MCV: 74 fL — ABNORMAL LOW (ref 80.0–100.0)
Monocytes Absolute: 0.3 10*3/uL (ref 0.1–1.0)
Monocytes Relative: 11 %
Neutro Abs: 1.1 10*3/uL — ABNORMAL LOW (ref 1.7–7.7)
Neutrophils Relative %: 37 %
Platelets: 201 10*3/uL (ref 150–400)
RBC: 4.2 MIL/uL (ref 3.87–5.11)
RDW: 19.1 % — ABNORMAL HIGH (ref 11.5–15.5)
WBC: 3 10*3/uL — ABNORMAL LOW (ref 4.0–10.5)
nRBC: 0 % (ref 0.0–0.2)

## 2020-10-30 LAB — TYPE AND SCREEN
ABO/RH(D): A POS
Antibody Screen: NEGATIVE
Unit division: 0

## 2020-10-30 LAB — BPAM RBC
Blood Product Expiration Date: 202207252359
ISSUE DATE / TIME: 202207010855
Unit Type and Rh: 6200

## 2020-10-30 LAB — COMPREHENSIVE METABOLIC PANEL
ALT: 13 U/L (ref 0–44)
AST: 19 U/L (ref 15–41)
Albumin: 3.1 g/dL — ABNORMAL LOW (ref 3.5–5.0)
Alkaline Phosphatase: 64 U/L (ref 38–126)
Anion gap: 7 (ref 5–15)
BUN: 9 mg/dL (ref 8–23)
CO2: 25 mmol/L (ref 22–32)
Calcium: 8.4 mg/dL — ABNORMAL LOW (ref 8.9–10.3)
Chloride: 106 mmol/L (ref 98–111)
Creatinine, Ser: 0.57 mg/dL (ref 0.44–1.00)
GFR, Estimated: >60 mL/min (ref >60)
Glucose, Bld: 85 mg/dL (ref 70–99)
Potassium: 3.5 mmol/L (ref 3.5–5.1)
Sodium: 138 mmol/L (ref 135–145)
Total Bilirubin: 0.9 mg/dL (ref 0.3–1.2)
Total Protein: 5.4 g/dL — ABNORMAL LOW (ref 6.5–8.1)

## 2020-10-30 LAB — C-REACTIVE PROTEIN: CRP: 0.5 mg/dL (ref <1.0)

## 2020-10-30 LAB — D-DIMER, QUANTITATIVE: D-Dimer, Quant: 0.82 ug/mL-FEU — ABNORMAL HIGH (ref 0.00–0.50)

## 2020-10-30 LAB — BRAIN NATRIURETIC PEPTIDE: B Natriuretic Peptide: 233.1 pg/mL — ABNORMAL HIGH (ref 0.0–100.0)

## 2020-10-30 LAB — MAGNESIUM: Magnesium: 2 mg/dL (ref 1.7–2.4)

## 2020-10-30 SURGERY — ESOPHAGOGASTRODUODENOSCOPY (EGD) WITH PROPOFOL
Anesthesia: Monitor Anesthesia Care

## 2020-10-30 MED ORDER — PROPOFOL 10 MG/ML IV BOLUS
INTRAVENOUS | Status: DC | PRN
  Administered 2020-10-30 (×2): 20 mg via INTRAVENOUS

## 2020-10-30 MED ORDER — SALINE SPRAY 0.65 % NA SOLN
1.0000 | NASAL | Status: DC | PRN
  Filled 2020-10-30: qty 44

## 2020-10-30 MED ORDER — LACTATED RINGERS IV SOLN: INTRAVENOUS | Status: DC | PRN

## 2020-10-30 MED ORDER — PROPOFOL 500 MG/50ML IV EMUL
INTRAVENOUS | Status: DC | PRN
  Administered 2020-10-30: 100 ug/kg/min via INTRAVENOUS

## 2020-10-30 SURGICAL SUPPLY — 15 items

## 2020-10-30 NOTE — Progress Notes (Addendum)
PROGRESS NOTE                                                                                                                                                                                                             Patient Demographics:    Shelley Coleman, is a 80 y.o. female, DOB - [birth year], FBP:[id number]  Outpatient Primary MD for the patient is Dorothyann Peng, NP    LOS - 1  Admit date - 10/28/2020    Chief Complaint  Patient presents with   Fatigue       Brief Narrative : 80 year old female with past medical history of hyperlipidemia, restless leg syndrome, diastolic congestive heart failure (Echo 2017 EF 55-60%) who presents to [facility] emergency room with complaints of shortness of breath and weakness-found to have upper GI bleeding with acute blood loss anemia and incidental COVID-19 infection.  See below for further details.    Subjective:   No melena overnight.  No abdominal pain.  No nausea or vomiting.   Assessment  & Plan :   Upper GI bleeding with acute blood loss anemia: No melena overnight-s/p 1 unit of PRBC transfused so far-hemoglobin stable-on PPI-GI planning EGD today.  Counseled patient regarding minimizing/avoiding use of NSAID.  COVID-19 infection: Incidental-no symptoms-not hypoxic.  Remains on Remdesivir x3 days.  HTN: BP stable-continue atenolol-HCTZ remains on hold  HFpEF: Volume status stable-on beta-blocker  History of CVA: Plavix on hold-no focal deficits.  RLS: Continue Requip  Asymptomatic bacteriuria: Urine culture growing gram-negative bacteria-does not appear to have UTI symptoms-monitor off antibiotics.   History of Nissen's fundoplication  Obesity: follow with PCP Estimated body mass index is 30.52 kg/m as calculated from the following:   Height as of this encounter: 5\' 7"  (1.702 m).   Weight as of this encounter: 88.4 kg.          Condition -  Fair  Family Communication  :  daughter Barnett Applebaum [phone] - 10/29/20  Code Status :  Full  Consults  :  GI  PUD Prophylaxis : PPI   Procedures  :            Disposition Plan  :    Status is: Inpatient  Dispo: The patient is from: Home              Anticipated d/c is  to: Home              Patient currently is not medically stable to d/c.   Difficult to place patient No  DVT Prophylaxis  : SCDs  SCDs Start: 10/29/20 0543    Lab Results  Component Value Date   PLT 201 10/30/2020    Diet :  Diet Order             Diet NPO time specified  Diet effective now                    Inpatient Medications  Scheduled Meds:  vitamin C  500 mg Oral Daily   atenolol  50 mg Oral Daily   ferrous sulfate  325 mg Oral BID WC   folic acid  1 mg Oral Daily   pantoprazole (PROTONIX) IV  40 mg Intravenous Q12H   zinc sulfate  220 mg Oral Daily   Continuous Infusions:  remdesivir 100 mg in NS 100 mL 100 mg (10/30/20 1250)   PRN Meds:.acetaminophen, albuterol, ALPRAZolam, guaiFENesin-dextromethorphan, ondansetron **OR** ondansetron (ZOFRAN) IV, polyethylene glycol, rOPINIRole  Antibiotics  :    Anti-infectives (From admission, onward)    Start     Dose/Rate Route Frequency Ordered Stop   10/30/20 1000  remdesivir 100 mg in sodium chloride 0.9 % 100 mL IVPB       See Hyperspace for full Linked Orders Report.   100 mg 200 mL/hr over 30 Minutes Intravenous Daily 10/29/20 0544 11/01/20 0959   10/29/20 0700  remdesivir 200 mg in sodium chloride 0.9% 250 mL IVPB       See Hyperspace for full Linked Orders Report.   200 mg 580 mL/hr over 30 Minutes Intravenous Once 10/29/20 0544 10/29/20 1014        Time Spent in minutes  30   Oren Binet M.D on 10/30/2020 at 1:53 PM  To page go to [email or web address]   [facility] -  Office  [phone]    See all Orders from today for further details    Objective:   Vitals:   10/29/20 2357 10/30/20 0405 10/30/20 0751  10/30/20 1143  BP: (!) 157/94 (!) 150/86 (!) 141/73 (!) 151/65  Pulse: 62 72 96 60  Resp: 17 20 (!) 23 18  Temp: 97.8 F (36.6 C) 97.8 F (36.6 C) 98 F (36.7 C) 98 F (36.7 C)  TempSrc: Oral  Oral Oral  SpO2: 96% 95%  96%  Weight:      Height:        Wt Readings from Last 3 Encounters:  10/29/20 88.4 kg  04/22/20 82.3 kg  10/07/19 81.2 kg    No intake or output data in the 24 hours ending 10/30/20 1353   Physical Exam Gen Exam:Alert awake-not in any distress HEENT:atraumatic, normocephalic Chest: B/L clear to auscultation anteriorly CVS:S1S2 regular Abdomen:soft non tender, non distended Extremities:no edema Neurology: Non focal Skin: no rash      Data Review:    CBC Recent Labs  Lab 10/28/20 1643 10/29/20 0640 10/29/20 1535 10/30/20 0112  WBC 3.9* 3.8*  --  3.0*  HGB 8.6* 7.8* 9.1* 9.4*  HCT 30.8* 26.9* 30.5* 31.1*  PLT 239 208  --  201  MCV 75.9* 73.1*  --  74.0*  MCH 21.2* 21.2*  --  22.4*  MCHC 27.9* 29.0*  --  30.2  RDW 18.9* 18.7*  --  19.1*  LYMPHSABS  --  1.9  --  1.5  MONOABS  --  0.3  --  0.3  EOSABS  --  0.1  --  0.1  BASOSABS  --  0.0  --  0.0     Recent Labs  Lab 10/28/20 1643 10/28/20 2355 10/29/20 0640 10/30/20 0112  NA 138  --  138 138  K 4.2  --  3.7 3.5  CL 107  --  107 106  CO2 24  --  22 25  GLUCOSE 99  --  84 85  BUN 18  --  13 9  CREATININE 0.71  --  0.61 0.57  CALCIUM 8.3*  --  8.4* 8.4*  AST  --   --  20 19  ALT  --   --  11 13  ALKPHOS  --   --  64 64  BILITOT  --   --  0.5 0.9  ALBUMIN  --   --  3.1* 3.1*  MG  --   --   --  2.0  CRP  --   --  0.5 0.5  DDIMER  --   --  0.88* 0.82*  PROCALCITON  --   --  <0.10  --   INR  --  1.1  --   --   BNP  --   --  78.0 233.1*     ------------------------------------------------------------------------------------------------------------------ No results for input(s): CHOL, HDL, LDLCALC, TRIG, CHOLHDL, LDLDIRECT in the last 72 hours.  Lab Results  Component Value  Date   HGBA1C 5.3 11/01/2015   ------------------------------------------------------------------------------------------------------------------ No results for input(s): TSH, T4TOTAL, T3FREE, THYROIDAB in the last 72 hours.  Invalid input(s): FREET3  Cardiac Enzymes No results for input(s): CKMB, TROPONINI, MYOGLOBIN in the last 168 hours.  Invalid input(s): CK ------------------------------------------------------------------------------------------------------------------    Component Value Date/Time   BNP 233.1 (H) 10/30/2020 0112    Micro Results Recent Results (from the past 240 hour(s))  Urine culture     Status: Abnormal (Preliminary result)   Collection Time: 10/28/20 12:49 AM   Specimen: Urine, Random  Result Value Ref Range Status   Specimen Description URINE, RANDOM  Final   Special Requests NONE  Final   Culture (A)  Final    >=100,000 COLONIES/mL GRAM NEGATIVE RODS SUSCEPTIBILITIES TO FOLLOW Performed at [facility], [address] [place], [place] [address]    Report Status PENDING  Incomplete  Resp Panel by RT-PCR (Flu A&B, Covid) Nasopharyngeal Swab     Status: Abnormal   Collection Time: 10/28/20 11:36 PM   Specimen: Nasopharyngeal Swab; Nasopharyngeal(NP) swabs in vial transport medium  Result Value Ref Range Status   SARS Coronavirus 2 by RT PCR POSITIVE (A) NEGATIVE Final    Comment: RESULT CALLED TO, READ BACK BY AND VERIFIED WITH: L BALDWIN RN 10/29/20 0122 JDW (NOTE) SARS-CoV-2 target nucleic acids are DETECTED.  The SARS-CoV-2 RNA is generally detectable in upper respiratory specimens during the acute phase of infection. Positive results are indicative of the presence of the identified virus, but do not rule out bacterial infection or co-infection with other pathogens not detected by the test. Clinical correlation with patient history and other diagnostic information is necessary to determine patient infection status. The expected result  is Negative.  Fact Sheet for Patients: EntrepreneurPulse.com.au  Fact Sheet for Healthcare Providers: IncredibleEmployment.be  This test is not yet approved or cleared by the [place] FDA and  has been authorized for detection and/or diagnosis of SARS-CoV-2 by FDA under an Emergency Use Authorization (EUA).  This EUA will remain in effect (meaning  this test can be used ) for the duration of  the COVID-19 declaration under Section 564(b)(1) of the Act, 21 U.S.C. section 360bbb-3(b)(1), unless the authorization is terminated or revoked sooner.     Influenza A by PCR NEGATIVE NEGATIVE Final   Influenza B by PCR NEGATIVE NEGATIVE Final    Comment: (NOTE) The Xpert Xpress SARS-CoV-2/FLU/RSV plus assay is intended as an aid in the diagnosis of influenza from Nasopharyngeal swab specimens and should not be used as a sole basis for treatment. Nasal washings and aspirates are unacceptable for Xpert Xpress SARS-CoV-2/FLU/RSV testing.  Fact Sheet for Patients: EntrepreneurPulse.com.au  Fact Sheet for Healthcare Providers: IncredibleEmployment.be  This test is not yet approved or cleared by the [place] FDA and has been authorized for detection and/or diagnosis of SARS-CoV-2 by FDA under an Emergency Use Authorization (EUA). This EUA will remain in effect (meaning this test can be used) for the duration of the COVID-19 declaration under Section 564(b)(1) of the Act, 21 U.S.C. section 360bbb-3(b)(1), unless the authorization is terminated or revoked.  Performed at [facility], [place] [address] [place], [place] [address]   Culture, blood (Routine X 2) w Reflex to ID Panel     Status: None (Preliminary result)   Collection Time: 10/29/20  6:25 AM   Specimen: BLOOD LEFT ARM  Result Value Ref Range Status   Specimen Description BLOOD LEFT ARM  Final   Special Requests AEROBIC BOTTLE ONLY Blood Culture  adequate volume  Final   Culture   Final    NO GROWTH 1 DAY Performed at [facility], [place] [address], [place], [place] [address]    Report Status PENDING  Incomplete  Culture, blood (Routine X 2) w Reflex to ID Panel     Status: None (Preliminary result)   Collection Time: 10/29/20  6:32 AM   Specimen: BLOOD  Result Value Ref Range Status   Specimen Description BLOOD LEFT ANTECUBITAL  Final   Special Requests AEROBIC BOTTLE ONLY Blood Culture adequate volume  Final   Culture   Final    NO GROWTH 1 DAY Performed at [facility], [place] [address], [place], [place] [address]    Report Status PENDING  Incomplete    Radiology Reports DG Chest 2 View  Result Date: 10/28/2020 CLINICAL DATA:  Cold-like symptoms for 2 weeks, fatigue EXAM: CHEST - 2 VIEW COMPARISON:  Radiograph 11/01/2015 FINDINGS: Some mild airways thickening and increasing interstitial opacity within the lungs without focal consolidative density. No pneumothorax or visible effusion. Pulmonary vascularity is normally distributed. Stable cardiomediastinal contours. No acute osseous or soft tissue abnormality. Dextrocurvature of the spine is similar to priors. IMPRESSION: Mild airways thickening increasing interstitial opacity in the lungs, can be seen with atypical/viral infections. Electronically Signed   By: Lovena Le M.D.   On: 10/28/2020 18:25   DG Chest Port 1 View  Result Date: 10/29/2020 CLINICAL DATA:  Shortness of breath in a patient who is COVID-19 positive. EXAM: PORTABLE CHEST 1 VIEW COMPARISON:  PA and lateral chest 10/28/2020. FINDINGS: Lungs are clear. Heart size is normal. No pneumothorax or pleural fluid. No acute or focal bony abnormality. Small hiatal hernia noted. IMPRESSION: No acute disease. Electronically Signed   By: Inge Rise M.D.   On: 10/29/2020 08:24

## 2020-10-30 NOTE — Interval H&P Note (Signed)
History and Physical Interval Note:  10/30/2020 3:13 PM  Shelley Coleman  has presented today for surgery, with the diagnosis of melena, anemia.  The various methods of treatment have been discussed with the patient and family. After consideration of risks, benefits and other options for treatment, the patient has consented to  Procedure(s): ESOPHAGOGASTRODUODENOSCOPY (EGD) WITH PROPOFOL (N/A) as a surgical intervention.  The patient's history has been reviewed, patient examined, no change in status, stable for surgery.  I have reviewed the patient's chart and labs.  Questions were answered to the patient's satisfaction.     [name]

## 2020-10-30 NOTE — Op Note (Addendum)
[facility] Patient Name: Shelley Coleman Procedure Date : 10/30/2020 MRN: [id number] Attending MD: Mauri Pole , MD Date of Birth: 10-27-1940 CSN: [id number] Age: 80 Admit Type: Inpatient Procedure:                Upper GI endoscopy Indications:              Suspected upper gastrointestinal bleeding, Melena Providers:                Mauri Pole, MD, Kary Kos RN, RN,                            Benetta Spar, Technician Referring MD:              Medicines:                Monitored Anesthesia Care Complications:            No immediate complications. Estimated Blood Loss:     Estimated blood loss was minimal. Procedure:                Pre-Anesthesia Assessment:                           - Prior to the procedure, a History and Physical                            was performed, and patient medications and                            allergies were reviewed. The patient's tolerance of                            previous anesthesia was also reviewed. The risks                            and benefits of the procedure and the sedation                            options and risks were discussed with the patient.                            All questions were answered, and informed consent                            was obtained. Prior Anticoagulants: The patient                            last took Plavix (clopidogrel) 2 days prior to the                            procedure. ASA Grade Assessment: III - A patient                            with severe systemic disease. After reviewing the  risks and benefits, the patient was deemed in                            satisfactory condition to undergo the procedure.                           After obtaining informed consent, the endoscope was                            passed under direct vision. Throughout the                            procedure, the patient's blood pressure, pulse, and                             oxygen saturations were monitored continuously. The                            GIF-H190 ([id number]) Olympus gastroscope was                            introduced through the mouth, and advanced to the                            second part of duodenum. The upper GI endoscopy was                            accomplished without difficulty. The patient                            tolerated the procedure well. Scope In: Scope Out: Findings:      A large hiatal hernia was present.      Food bezoar was found in the hiatal hernia/ cardia. Removal of food was       accomplished.      A few localized Cameron's erosions with no bleeding and stigmata of       recent bleeding were found in the cardia/sliding hiatal hernia.      A single less than 1 mm angioectasia with typical arborization was found       in the second portion of the duodenum. Coagulation for bleeding       prevention using argon plasma was successful. Impression:               - Large hiatal hernia.                           - Food in the cardia. Removal was successful.                           - Gastric Cameron's erosions with no bleeding and                            stigmata of recent bleeding.                           -  A single angioectasia in the duodenum. Treated                            with argon plasma coagulation (APC). Recommendation:           - Patient has a contact number available for                            emergencies. The signs and symptoms of potential                            delayed complications were discussed with the                            patient. Return to normal activities tomorrow.                            Written discharge instructions were provided to the                            patient.                           - Resume previous diet.                           - Continue present medications.                           - No ibuprofen, naproxen, or other  non-steroidal                            anti-inflammatory drugs.                           - Use Protonix (pantoprazole) 40 mg PO BID for 3                            months followed by once daily.                           - Follow an antireflux regimen.                           - Resume Plavix (clopidogrel) at prior dose in 3                            days. Refer to managing physician for further                            adjustment of therapy.                           - Follow up in GI office in 4-6 weeks on discharge                           -  GI will sign off, available if have any questions Procedure Code(s):        --- Professional ---                           44818, 54, Esophagogastroduodenoscopy, flexible,                            transoral; with control of bleeding, any method                           43247, Esophagogastroduodenoscopy, flexible,                            transoral; with removal of foreign body(s) Diagnosis Code(s):        --- Professional ---                           K44.9, Diaphragmatic hernia without obstruction or                            gangrene                           T18.128A, Food in esophagus causing other injury,                            initial encounter                           K25.9, Gastric ulcer, unspecified as acute or                            chronic, without hemorrhage or perforation                           K31.819, Angiodysplasia of stomach and duodenum                            without bleeding                           K92.1, Melena (includes Hematochezia) CPT copyright 2019 American Medical Association. All rights reserved. The codes documented in this report are preliminary and upon coder review may  be revised to meet current compliance requirements. Mauri Pole, MD 10/30/2020 3:57:31 PM This report has been signed electronically. Number of Addenda: 0

## 2020-10-30 NOTE — Evaluation (Signed)
Occupational Therapy Evaluation Patient Details Name: Shelley Coleman MRN: [id number] DOB: Aug 12, 1940 Today's Date: 10/30/2020    History of Present Illness Pt is 80 yo female who presented on 10/28/20 with weakness and shortness of breath.  Pt found to be COVID + with symptomatic microcytic anemia. Pt with GI consult with Plan for EGD and colonscopy.  Medical hx includes HLD, RLS, CHF, prior back sx, L THA.   Clinical Impression   Pt PTA: Pt independent. Pt currently ambulatory in room for mobility with no AD. Education for energy conservation techniques performed. Pt performing ADL with supervisionA at sink and supervisionA for OOB mobility. Pt with headache at this time. RN aware. Pt having procedure later this PM and wanting to rest. Pt would benefit x1 therapy session s/p procedure for energy conservation and OOB ADL. OT following acutely. VSS on RA.     Follow Up Recommendations  No OT follow up    Equipment Recommendations  3 in 1 bedside commode    Recommendations for Other Services       Precautions / Restrictions Precautions Precautions: None Restrictions Weight Bearing Restrictions: No      Mobility Bed Mobility Overal bed mobility: Modified Independent Bed Mobility: Supine to Sit;Sit to Supine     Supine to sit: Modified independent (Device/Increase time) Sit to supine: Modified independent (Device/Increase time)        Transfers Overall transfer level: Needs assistance Equipment used: None Transfers: Sit to/from Stand Sit to Stand: Supervision              Balance Overall balance assessment: Independent   Sitting balance-Leahy Scale: Normal       Standing balance-Leahy Scale: Good                             ADL either performed or assessed with clinical judgement   ADL Overall ADL's : At baseline                                       General ADL Comments: Pt performing OOB grooming at sink as pt has just worked  with NT for toileting and grooming at sink. Pt ambulatory in room for mobility with no AD. Education for energy conservation techniques performed. Pt having procedure later this PM and wanting to rest.     Vision Baseline Vision/History: Wears glasses Wears Glasses: Reading only Patient Visual Report: No change from baseline Vision Assessment?: No apparent visual deficits     Perception     Praxis      Pertinent Vitals/Pain Pain Assessment: 0-10 Pain Score: 4  Pain Location: headache Pain Descriptors / Indicators: Crushing;Stabbing Pain Intervention(s): Monitored during session;Premedicated before session;Repositioned     Hand Dominance Left   Extremity/Trunk Assessment Upper Extremity Assessment Upper Extremity Assessment: Overall WFL for tasks assessed   Lower Extremity Assessment Lower Extremity Assessment: Overall WFL for tasks assessed   Cervical / Trunk Assessment Cervical / Trunk Assessment: Normal   Communication Communication Communication: No difficulties   Cognition Arousal/Alertness: Awake/alert Behavior During Therapy: WFL for tasks assessed/performed Overall Cognitive Status: Within Functional Limits for tasks assessed                                 General Comments: slow to respond from headache pain  General Comments  VSS on RA. daughter in PPE and in room,    Exercises     Shoulder Instructions      Home Living Family/patient expects to be discharged to:: Private residence Living Arrangements: Alone Available Help at Discharge: Family;Available PRN/intermittently Type of Home: House Home Access: Stairs to enter [employer] of Steps: 1   Home Layout: One level     Bathroom Shower/Tub: Occupational psychologist: Standard     Home Equipment: Walker - standard;Shower seat - built in;Hand held shower head;Grab bars - tub/shower          Prior Functioning/Environment Level of Independence: Equities trader / Transfers Assistance Needed: ambulates in community ADL's / Homemaking Assistance Needed: Independent with ADLs, IADLs , driving   Comments: Reports getting OOB and fatigued over the last month.        OT Problem List: Decreased activity tolerance;Impaired balance (sitting and/or standing);Decreased safety awareness;Pain      OT Treatment/Interventions: Self-care/ADL training;Therapeutic exercise;Energy conservation;DME and/or AE instruction;Therapeutic activities;Cognitive remediation/compensation;Patient/family education;Balance training    OT Goals(Current goals can be found in the care plan section) Acute Rehab OT Goals Patient Stated Goal: regain strength OT Goal Formulation: With patient Time For Goal Achievement: 11/13/20 Potential to Achieve Goals: Good ADL Goals Additional ADL Goal #1: Pt will state 3 energy conservation techniques for OOB ADL and mobility to increase activity tolerance. Additional ADL Goal #2: Pt will perform OOB ADL x10 mins with  modified independence and no cues for safety to increase independence wtih ADL.  OT Frequency: Min 2X/week   Barriers to D/C:            Co-evaluation              AM-PAC OT "6 Clicks" Daily Activity     Outcome Measure Help from another person eating meals?: None Help from another person taking care of personal grooming?: A Little Help from another person toileting, which includes using toliet, bedpan, or urinal?: A Little Help from another person bathing (including washing, rinsing, drying)?: A Little Help from another person to put on and taking off regular upper body clothing?: None Help from another person to put on and taking off regular lower body clothing?: A Little 6 Click Score: 20   End of Session Nurse Communication: Mobility status  Activity Tolerance: Patient tolerated treatment well;Patient limited by pain Patient left: in bed;with call bell/phone within reach;with family/visitor  present;with nursing/sitter in room  OT Visit Diagnosis: Unsteadiness on feet (R26.81)                Time: 7209-4709 OT Time Calculation (min): 21 min Charges:  OT General Charges $OT Visit: 1 Visit OT Evaluation $OT Eval Moderate Complexity: 1 Mod  Jefferey Pica, OTR/L Acute Rehabilitation Services Pager: [phone] Office: [phone]   Augustin Schooling 10/30/2020, 2:36 PM

## 2020-10-30 NOTE — Anesthesia Preprocedure Evaluation (Signed)
Anesthesia Evaluation  Patient identified by MRN, date of birth, ID band Patient awake    Reviewed: Allergy & Precautions, NPO status , Patient's Chart, lab work & pertinent test results  Airway Mallampati: II  TM Distance: >3 FB     Dental   Pulmonary    breath sounds clear to auscultation       Cardiovascular hypertension, +CHF   Rhythm:Regular Rate:Normal     Neuro/Psych  Headaches,  Neuromuscular disease CVA    GI/Hepatic Neg liver ROS, hiatal hernia, GERD  ,  Endo/Other    Renal/GU negative Renal ROS     Musculoskeletal   Abdominal   Peds  Hematology  (+) anemia ,   Anesthesia Other Findings   Reproductive/Obstetrics                             Anesthesia Physical Anesthesia Plan  ASA: 3  Anesthesia Plan: MAC   Post-op Pain Management:    Induction: Intravenous  PONV Risk Score and Plan: Ondansetron and Propofol infusion  Airway Management Planned: Nasal Cannula and Simple Face Mask  Additional Equipment:   Intra-op Plan:   Post-operative Plan:   Informed Consent: I have reviewed the patients History and Physical, chart, labs and discussed the procedure including the risks, benefits and alternatives for the proposed anesthesia with the patient or authorized representative who has indicated his/her understanding and acceptance.     Dental advisory given  Plan Discussed with: CRNA and Anesthesiologist  Anesthesia Plan Comments:         Anesthesia Quick Evaluation

## 2020-10-30 NOTE — Transfer of Care (Signed)
Immediate Anesthesia Transfer of Care Note  Patient: Shelley Coleman  Procedure(s) Performed: ESOPHAGOGASTRODUODENOSCOPY (EGD) WITH PROPOFOL HOT HEMOSTASIS (ARGON PLASMA COAGULATION/BICAP)  Patient Location: Endoscopy Unit  Anesthesia Type:MAC  Level of Consciousness: sedated  Airway & Oxygen Therapy: Patient Spontanous Breathing and Patient connected to nasal cannula oxygen  Post-op Assessment: Report given to RN and Post -op Vital signs reviewed and stable  Post vital signs: Reviewed and stable  Last Vitals:  Vitals Value Taken Time  BP    Temp    Pulse    Resp    SpO2      Last Pain:  Vitals:   10/30/20 1529  TempSrc:   PainSc: 0-No pain         Complications: No notable events documented.

## 2020-10-31 LAB — COMPREHENSIVE METABOLIC PANEL
ALT: 13 U/L (ref 0–44)
AST: 19 U/L (ref 15–41)
Albumin: 3.1 g/dL — ABNORMAL LOW (ref 3.5–5.0)
Alkaline Phosphatase: 62 U/L (ref 38–126)
Anion gap: 7 (ref 5–15)
BUN: 13 mg/dL (ref 8–23)
CO2: 25 mmol/L (ref 22–32)
Calcium: 8.4 mg/dL — ABNORMAL LOW (ref 8.9–10.3)
Chloride: 106 mmol/L (ref 98–111)
Creatinine, Ser: 0.58 mg/dL (ref 0.44–1.00)
GFR, Estimated: >60 mL/min (ref >60)
Glucose, Bld: 100 mg/dL — ABNORMAL HIGH (ref 70–99)
Potassium: 3.6 mmol/L (ref 3.5–5.1)
Sodium: 138 mmol/L (ref 135–145)
Total Bilirubin: 0.5 mg/dL (ref 0.3–1.2)
Total Protein: 5.5 g/dL — ABNORMAL LOW (ref 6.5–8.1)

## 2020-10-31 LAB — CBC WITH DIFFERENTIAL/PLATELET
Abs Immature Granulocytes: 0.01 10*3/uL (ref 0.00–0.07)
Basophils Absolute: 0 10*3/uL (ref 0.0–0.1)
Basophils Relative: 1 %
Eosinophils Absolute: 0.1 10*3/uL (ref 0.0–0.5)
Eosinophils Relative: 1 %
HCT: 30.2 % — ABNORMAL LOW (ref 36.0–46.0)
Hemoglobin: 9.3 g/dL — ABNORMAL LOW (ref 12.0–15.0)
Immature Granulocytes: 0 %
Lymphocytes Relative: 51 %
Lymphs Abs: 1.7 10*3/uL (ref 0.7–4.0)
MCH: 22.6 pg — ABNORMAL LOW (ref 26.0–34.0)
MCHC: 30.8 g/dL (ref 30.0–36.0)
MCV: 73.5 fL — ABNORMAL LOW (ref 80.0–100.0)
Monocytes Absolute: 0.4 10*3/uL (ref 0.1–1.0)
Monocytes Relative: 11 %
Neutro Abs: 1.3 10*3/uL — ABNORMAL LOW (ref 1.7–7.7)
Neutrophils Relative %: 36 %
Platelets: 181 10*3/uL (ref 150–400)
RBC: 4.11 MIL/uL (ref 3.87–5.11)
RDW: 19.7 % — ABNORMAL HIGH (ref 11.5–15.5)
WBC: 3.5 10*3/uL — ABNORMAL LOW (ref 4.0–10.5)
nRBC: 0 % (ref 0.0–0.2)

## 2020-10-31 LAB — URINE CULTURE: Culture: >=100000 — AB

## 2020-10-31 LAB — GLUCOSE, CAPILLARY: Glucose-Capillary: 109 mg/dL — ABNORMAL HIGH (ref 70–99)

## 2020-10-31 MED ORDER — PANTOPRAZOLE SODIUM 40 MG PO TBEC
40.0000 mg | DELAYED_RELEASE_TABLET | Freq: Two times a day (BID) | ORAL | Status: DC
  Administered 2020-10-31 – 2020-11-01 (×2): 40 mg via ORAL
  Filled 2020-10-31 (×2): qty 1

## 2020-10-31 MED ORDER — LACTATED RINGERS IV SOLN: INTRAVENOUS | Status: DC

## 2020-10-31 MED ORDER — ATENOLOL 50 MG PO TABS: 25.0000 mg | ORAL_TABLET | Freq: Every day | ORAL | Status: DC

## 2020-10-31 NOTE — Progress Notes (Signed)
PROGRESS NOTE                                                                                                                                                                                                             Patient Demographics:    Shelley Coleman, is a 80 y.o. female, DOB - 09/13/1940, ZOX:[id number]  Outpatient Primary MD for the patient is Dorothyann Peng, NP    LOS - 2  Admit date - 10/28/2020    Chief Complaint  Patient presents with   Fatigue       Brief Narrative : 80 year old female with past medical history of hyperlipidemia, restless leg syndrome, diastolic congestive heart failure (Echo 2017 EF 55-60%) who presents to [facility] emergency room with complaints of shortness of breath and weakness-found to have upper GI bleeding with acute blood loss anemia and incidental COVID-19 infection.  See below for further details.    Subjective:   No further melena-however got very lightheaded-woozy when she got up this morning.  Orthostatic vital signs were negative.   Assessment  & Plan :   Upper GI bleeding with acute blood loss anemia: No further melena-hemoglobin stable-did receive 1 unit of PRBC on initial presentation.  EGD on 7/2 showed Lysbeth Galas lesions/AVMs-s/p APC.  GI recommending to continue PPI twice daily for 3 months, followed by once daily.    Lightheaded/dizzy: Likely due to orthostatic hypotension-gently hydrate for a few hours.  Decrease atenolol to 25 mg daily.  Ambulate with nursing staff/PT.  Repeat orthostatics on 7/4  COVID-19 infection: Incidental-no symptoms-not hypoxic.  No PNA on CXR.  Remains on Remdesivir x3 days.  HTN: BP stable-on atenolol-decreased dosage to 25 mg daily due to concern for orthostatics.  HCTZ remains on hold  HFpEF: Volume status stable-on beta-blocker  History of CVA: Plavix on hold-no focal deficits.  Per Wallace Going to resume Plavix x3 days from day of  endoscopy.  RLS: Continue Requip  Asymptomatic bacteriuria: Urine culture growing gram-negative bacteria-does not appear to have UTI symptoms-monitor off antibiotics.   History of Nissen's fundoplication  Obesity: follow with PCP Estimated body mass index is 30.8 kg/m as calculated from the following:   Height as of this encounter: 5\' 7"  (1.702 m).   Weight as of this encounter: 89.2 kg.         Condition - Fair  Family Communication  :  daughter Barnett Applebaum [phone] - 7/3  Code Status :  Full  Consults  :  GI  PUD Prophylaxis : PPI   Procedures  :            Disposition Plan  :    Status is: Inpatient  Dispo: The patient is from: Home              Anticipated d/c is to: Home              Patient currently is not medically stable to d/c.   Difficult to place patient No  DVT Prophylaxis  : SCDs  SCDs Start: 10/29/20 0543    Lab Results  Component Value Date   PLT 181 10/31/2020    Diet :  Diet Order             Diet Heart Room service appropriate? No; Fluid consistency: Thin  Diet effective now                    Inpatient Medications  Scheduled Meds:  vitamin C  500 mg Oral Daily   atenolol  50 mg Oral Daily   ferrous sulfate  325 mg Oral BID WC   folic acid  1 mg Oral Daily   pantoprazole (PROTONIX) IV  40 mg Intravenous Q12H   zinc sulfate  220 mg Oral Daily   Continuous Infusions:   PRN Meds:.acetaminophen, albuterol, ALPRAZolam, guaiFENesin-dextromethorphan, ondansetron **OR** ondansetron (ZOFRAN) IV, polyethylene glycol, rOPINIRole, sodium chloride  Antibiotics  :    Anti-infectives (From admission, onward)    Start     Dose/Rate Route Frequency Ordered Stop   10/30/20 1000  remdesivir 100 mg in sodium chloride 0.9 % 100 mL IVPB       See Hyperspace for full Linked Orders Report.   100 mg 200 mL/hr over 30 Minutes Intravenous Daily 10/29/20 0544 10/31/20 1127   10/29/20 0700  remdesivir 200 mg in sodium chloride 0.9% 250 mL  IVPB       See Hyperspace for full Linked Orders Report.   200 mg 580 mL/hr over 30 Minutes Intravenous Once 10/29/20 0544 10/29/20 1014        Time Spent in minutes  30   Oren Binet M.D on 10/31/2020 at 12:38 PM  To page go to [email or web address]   [facility] -  Office  [phone]    See all Orders from today for further details    Objective:   Vitals:   10/31/20 0518 10/31/20 0728 10/31/20 1028 10/31/20 1202  BP: (!) 142/81 (!) 148/73 (!) 152/73 (!) 132/93  Pulse: 64 71 65 77  Resp: 19 (!) 23 20 (!) 21  Temp: 98.4 F (36.9 C) 98.3 F (36.8 C) 98.3 F (36.8 C) 98.3 F (36.8 C)  TempSrc: Oral Oral Oral Oral  SpO2: 95% 96% 95% 96%  Weight: 89.2 kg     Height:        Wt Readings from Last 3 Encounters:  10/31/20 89.2 kg  04/22/20 82.3 kg  10/07/19 81.2 kg     Intake/Output Summary (Last 24 hours) at 10/31/2020 1238 Last data filed at 10/30/2020 1545 Gross per 24 hour  Intake 100 ml  Output --  Net 100 ml     Physical Exam Gen Exam:Alert awake-not in any distress HEENT:atraumatic, normocephalic Chest: B/L clear to auscultation anteriorly CVS:S1S2 regular Abdomen:soft non tender, non distended Extremities:no edema Neurology: Non focal Skin: no rash  Data Review:    CBC Recent Labs  Lab 10/28/20 1643 10/29/20 0640 10/29/20 1535 10/30/20 0112 10/31/20 0205  WBC 3.9* 3.8*  --  3.0* 3.5*  HGB 8.6* 7.8* 9.1* 9.4* 9.3*  HCT 30.8* 26.9* 30.5* 31.1* 30.2*  PLT 239 208  --  201 181  MCV 75.9* 73.1*  --  74.0* 73.5*  MCH 21.2* 21.2*  --  22.4* 22.6*  MCHC 27.9* 29.0*  --  30.2 30.8  RDW 18.9* 18.7*  --  19.1* 19.7*  LYMPHSABS  --  1.9  --  1.5 1.7  MONOABS  --  0.3  --  0.3 0.4  EOSABS  --  0.1  --  0.1 0.1  BASOSABS  --  0.0  --  0.0 0.0     Recent Labs  Lab 10/28/20 1643 10/28/20 2355 10/29/20 0640 10/30/20 0112 10/31/20 0205  NA 138  --  138 138 138  K 4.2  --  3.7 3.5 3.6  CL 107  --  107 106 106  CO2 24  --  22 25 25    GLUCOSE 99  --  84 85 100*  BUN 18  --  13 9 13   CREATININE 0.71  --  0.61 0.57 0.58  CALCIUM 8.3*  --  8.4* 8.4* 8.4*  AST  --   --  20 19 19   ALT  --   --  11 13 13   ALKPHOS  --   --  64 64 62  BILITOT  --   --  0.5 0.9 0.5  ALBUMIN  --   --  3.1* 3.1* 3.1*  MG  --   --   --  2.0  --   CRP  --   --  0.5 0.5  --   DDIMER  --   --  0.88* 0.82*  --   PROCALCITON  --   --  <0.10  --   --   INR  --  1.1  --   --   --   BNP  --   --  78.0 233.1*  --      ------------------------------------------------------------------------------------------------------------------ No results for input(s): CHOL, HDL, LDLCALC, TRIG, CHOLHDL, LDLDIRECT in the last 72 hours.  Lab Results  Component Value Date   HGBA1C 5.3 11/01/2015   ------------------------------------------------------------------------------------------------------------------ No results for input(s): TSH, T4TOTAL, T3FREE, THYROIDAB in the last 72 hours.  Invalid input(s): FREET3  Cardiac Enzymes No results for input(s): CKMB, TROPONINI, MYOGLOBIN in the last 168 hours.  Invalid input(s): CK ------------------------------------------------------------------------------------------------------------------    Component Value Date/Time   BNP 233.1 (H) 10/30/2020 0112    Micro Results Recent Results (from the past 240 hour(s))  Urine culture     Status: Abnormal (Preliminary result)   Collection Time: 10/28/20 12:49 AM   Specimen: Urine, Random  Result Value Ref Range Status   Specimen Description URINE, RANDOM  Final   Special Requests NONE  Final   Culture (A)  Final    >=100,000 COLONIES/mL KLEBSIELLA PNEUMONIAE SUSCEPTIBILITIES TO FOLLOW Performed at [facility], [place] [address] [place], [place] [address]    Report Status PENDING  Incomplete  Resp Panel by RT-PCR (Flu A&B, Covid) Nasopharyngeal Swab     Status: Abnormal   Collection Time: 10/28/20 11:36 PM   Specimen: Nasopharyngeal Swab;  Nasopharyngeal(NP) swabs in vial transport medium  Result Value Ref Range Status   SARS Coronavirus 2 by RT PCR POSITIVE (A) NEGATIVE Final    Comment: RESULT CALLED TO,  READ BACK BY AND VERIFIED WITH: L BALDWIN RN 10/29/20 0122 JDW (NOTE) SARS-CoV-2 target nucleic acids are DETECTED.  The SARS-CoV-2 RNA is generally detectable in upper respiratory specimens during the acute phase of infection. Positive results are indicative of the presence of the identified virus, but do not rule out bacterial infection or co-infection with other pathogens not detected by the test. Clinical correlation with patient history and other diagnostic information is necessary to determine patient infection status. The expected result is Negative.  Fact Sheet for Patients: EntrepreneurPulse.com.au  Fact Sheet for Healthcare Providers: IncredibleEmployment.be  This test is not yet approved or cleared by the [place] FDA and  has been authorized for detection and/or diagnosis of SARS-CoV-2 by FDA under an Emergency Use Authorization (EUA).  This EUA will remain in effect (meaning this test can be used ) for the duration of  the COVID-19 declaration under Section 564(b)(1) of the Act, 21 U.S.C. section 360bbb-3(b)(1), unless the authorization is terminated or revoked sooner.     Influenza A by PCR NEGATIVE NEGATIVE Final   Influenza B by PCR NEGATIVE NEGATIVE Final    Comment: (NOTE) The Xpert Xpress SARS-CoV-2/FLU/RSV plus assay is intended as an aid in the diagnosis of influenza from Nasopharyngeal swab specimens and should not be used as a sole basis for treatment. Nasal washings and aspirates are unacceptable for Xpert Xpress SARS-CoV-2/FLU/RSV testing.  Fact Sheet for Patients: EntrepreneurPulse.com.au  Fact Sheet for Healthcare Providers: IncredibleEmployment.be  This test is not yet approved or cleared by the [place] FDA and has been authorized for detection and/or diagnosis of SARS-CoV-2 by FDA under an Emergency Use Authorization (EUA). This EUA will remain in effect (meaning this test can be used) for the duration of the COVID-19 declaration under Section 564(b)(1) of the Act, 21 U.S.C. section 360bbb-3(b)(1), unless the authorization is terminated or revoked.  Performed at [facility], [place] [address], [place], [place] [address]   Culture, blood (Routine X 2) w Reflex to ID Panel     Status: None (Preliminary result)   Collection Time: 10/29/20  6:25 AM   Specimen: BLOOD LEFT ARM  Result Value Ref Range Status   Specimen Description BLOOD LEFT ARM  Final   Special Requests AEROBIC BOTTLE ONLY Blood Culture adequate volume  Final   Culture   Final    NO GROWTH 2 DAYS Performed at [place] [address] [place], [place] [address]    Report Status PENDING  Incomplete  Culture, blood (Routine X 2) w Reflex to ID Panel     Status: None (Preliminary result)   Collection Time: 10/29/20  6:32 AM   Specimen: BLOOD  Result Value Ref Range Status   Specimen Description BLOOD LEFT ANTECUBITAL  Final   Special Requests AEROBIC BOTTLE ONLY Blood Culture adequate volume  Final   Culture   Final    NO GROWTH 2 DAYS Performed at [facility], [place] [address] [place],  [address]    Report Status PENDING  Incomplete    Radiology Reports DG Chest 2 View  Result Date: 10/28/2020 CLINICAL DATA:  Cold-like symptoms for 2 weeks, fatigue EXAM: CHEST - 2 VIEW COMPARISON:  Radiograph 11/01/2015 FINDINGS: Some mild airways thickening and increasing interstitial opacity within the lungs without focal consolidative density. No pneumothorax or visible effusion. Pulmonary vascularity is normally distributed. Stable cardiomediastinal contours. No acute osseous or soft tissue abnormality. Dextrocurvature of the spine is similar to priors. IMPRESSION: Mild  airways thickening  increasing interstitial opacity in the lungs, can be seen with atypical/viral infections. Electronically Signed   By: Lovena Le M.D.   On: 10/28/2020 18:25   DG Chest Port 1 View  Result Date: 10/29/2020 CLINICAL DATA:  Shortness of breath in a patient who is COVID-19 positive. EXAM: PORTABLE CHEST 1 VIEW COMPARISON:  PA and lateral chest 10/28/2020. FINDINGS: Lungs are clear. Heart size is normal. No pneumothorax or pleural fluid. No acute or focal bony abnormality. Small hiatal hernia noted. IMPRESSION: No acute disease. Electronically Signed   By: Inge Rise M.D.   On: 10/29/2020 08:24

## 2020-11-01 DIAGNOSIS — D500 Iron deficiency anemia secondary to blood loss (chronic): Secondary | ICD-10-CM

## 2020-11-01 LAB — CBC WITH DIFFERENTIAL/PLATELET
Abs Immature Granulocytes: 0.01 10*3/uL (ref 0.00–0.07)
Basophils Absolute: 0 10*3/uL (ref 0.0–0.1)
Basophils Relative: 1 %
Eosinophils Absolute: 0.1 10*3/uL (ref 0.0–0.5)
Eosinophils Relative: 2 %
HCT: 31.3 % — ABNORMAL LOW (ref 36.0–46.0)
Hemoglobin: 9.6 g/dL — ABNORMAL LOW (ref 12.0–15.0)
Immature Granulocytes: 0 %
Lymphocytes Relative: 47 %
Lymphs Abs: 1.9 10*3/uL (ref 0.7–4.0)
MCH: 22.9 pg — ABNORMAL LOW (ref 26.0–34.0)
MCHC: 30.7 g/dL (ref 30.0–36.0)
MCV: 74.5 fL — ABNORMAL LOW (ref 80.0–100.0)
Monocytes Absolute: 0.4 10*3/uL (ref 0.1–1.0)
Monocytes Relative: 10 %
Neutro Abs: 1.6 10*3/uL — ABNORMAL LOW (ref 1.7–7.7)
Neutrophils Relative %: 40 %
Platelets: 195 10*3/uL (ref 150–400)
RBC: 4.2 MIL/uL (ref 3.87–5.11)
RDW: 20.2 % — ABNORMAL HIGH (ref 11.5–15.5)
WBC: 4 10*3/uL (ref 4.0–10.5)
nRBC: 0 % (ref 0.0–0.2)

## 2020-11-01 MED ORDER — CLOPIDOGREL BISULFATE 75 MG PO TABS
75.0000 mg | ORAL_TABLET | Freq: Every day | ORAL | Status: DC
Start: 2020-11-03 — End: 2021-06-17

## 2020-11-01 MED ORDER — ATENOLOL 25 MG PO TABS: 12.5000 mg | ORAL_TABLET | Freq: Every day | ORAL | 0 refills | Status: DC

## 2020-11-01 MED ORDER — FERROUS SULFATE 325 (65 FE) MG PO TABS: 325.0000 mg | ORAL_TABLET | Freq: Two times a day (BID) | ORAL | 0 refills | Status: DC

## 2020-11-01 MED ORDER — PANTOPRAZOLE SODIUM 40 MG PO TBEC: 40.0000 mg | DELAYED_RELEASE_TABLET | Freq: Two times a day (BID) | ORAL | 0 refills | Status: DC

## 2020-11-01 MED ORDER — ATENOLOL 25 MG PO TABS
12.5000 mg | ORAL_TABLET | Freq: Every day | ORAL | Status: DC
  Administered 2020-11-01: 12.5 mg via ORAL
  Filled 2020-11-01: qty 0.5

## 2020-11-01 NOTE — Discharge Summary (Signed)
PATIENT DETAILS Name: Shelley Coleman Age: 80 y.o. Sex: female Date of Birth: 09-May-1940 MRN: [id number]. Admitting Physician: Thurnell Lose, MD [id number], Tommi Rumps, NP  Admit Date: 10/28/2020 Discharge date: 11/01/2020  Recommendations for Outpatient Follow-up:  Follow up with PCP in 1-2 weeks Please obtain CMP/CBC in one week   Admitted From:  Home  Disposition: [place]: No  Equipment/Devices: None  Discharge Condition: Stable  CODE STATUS: FULL CODE  Diet recommendation:  Diet Order             Diet - low sodium heart healthy           Diet Heart Room service appropriate? No; Fluid consistency: Thin  Diet effective now                    Brief Summary: 80 year old female with past medical history of hyperlipidemia, restless leg syndrome, diastolic congestive heart failure (Echo 2017 EF 55-60%) who presents to [facility] emergency room with complaints of shortness of breath and weakness-found to have upper GI bleeding with acute blood loss anemia and incidental COVID-19 infection.  See below for further details.  Brief Hospital Course: Upper GI bleeding with acute blood loss anemia: No further melena-hemoglobin stable-did receive 1 unit of PRBC on initial presentation.  EGD on 7/2 showed Lysbeth Galas lesions/AVMs-s/p APC.  GI recommending to continue PPI twice daily for 3 months, followed by once daily.  She is aware of the need to minimize NSAID use is much as possible.   Lightheaded/dizzy: Likely due to orthostatic hypotension-managed with IV fluids-atenolol dose reduced to 12.5 mg daily-compression stockings placed.  Repeat orthostatic vital signs on 7/4 are much better-patient is now ambulating the in the room without any issues.   COVID-19 infection: Incidental-no symptoms-not hypoxic.  No PNA on CXR.  Completed Remdesivir x3 days.   HTN: BP stable-on atenolol-decreased dosage to 12.5 mg daily due to concern for orthostatics.  HCTZ  remains on hold   HFpEF: Volume status stable-on beta-blocker   History of CVA: Plavix on hold-no focal deficits.  Per [facility] Going to resume Plavix x3 days from day of endoscopy.  RLS: Continue Requip   Asymptomatic bacteriuria: Urine culture growing gram-negative bacteria-does not appear to have UTI symptoms-monitor off antibiotics.   History of Nissen's fundoplication   Obesity: follow with PCP Estimated body mass index is 30.8 kg/m as calculated from the following:   Height as of this encounter: 5\' 7"  (1.702 m).   Weight as of this encounter: 89.2 kg.    Procedures/ 7/2 EGD: Lysbeth Galas lesions, AVM in the duodenum-s/p APC.  Discharge Diagnoses:  Principal Problem:   COVID-19 virus infection Active Problems:   GERD (gastroesophageal reflux disease)   Acute anemia   Restless legs syndrome (RLS)   Essential hypertension   Postural dizziness with presyncope   Chronic diastolic CHF (congestive heart failure) (HCC)   Iron deficiency anemia due to chronic blood loss   Hiatal hernia   Lysbeth Galas ulcer   Discharge Instructions:  Activity:  As tolerated with Full fall precautions use walker/cane & assistance as needed  Discharge Instructions     Call MD for:   Complete by: As directed    Seek immediate medical attention if you have black tarry stools, bloody stools or if you vomit blood.   Call MD for:  difficulty breathing, headache or visual disturbances   Complete by: As directed    Diet - low sodium heart healthy   Complete  by: As directed    Discharge instructions   Complete by: As directed    Follow with Primary MD  Dorothyann Peng, NP in 1-2 weeks  Please get a complete blood count and chemistry panel checked by your Primary MD at your next visit, and again as instructed by your Primary MD.  Get Medicines reviewed and adjusted: Please take all your medications with you for your next visit with your Primary MD  Laboratory/radiological data: Please request your Primary  MD to go over all hospital tests and procedure/radiological results at the follow up, please ask your Primary MD to get all Hospital records sent to his/her office.  In some cases, they will be blood work, cultures and biopsy results pending at the time of your discharge. Please request that your primary care M.D. follows up on these results.  Also Note the following: If you experience worsening of your admission symptoms, develop shortness of breath, life threatening emergency, suicidal or homicidal thoughts you must seek medical attention immediately by calling 911 or calling your MD immediately  if symptoms less severe.  You must read complete instructions/literature along with all the possible adverse reactions/side effects for all the Medicines you take and that have been prescribed to you. Take any new Medicines after you have completely understood and accpet all the possible adverse reactions/side effects.   Do not drive when taking Pain medications or sleeping medications (Benzodaizepines)  Do not take more than prescribed Pain, Sleep and Anxiety Medications. It is not advisable to combine anxiety,sleep and pain medications without talking with your primary care practitioner  Special Instructions: If you have smoked or chewed Tobacco  in the last 2 yrs please stop smoking, stop any regular Alcohol  and or any Recreational drug use.  Wear Seat belts while driving.  Please note: You were cared for by a hospitalist during your hospital stay. Once you are discharged, your primary care physician will handle any further medical issues. Please note that NO REFILLS for any discharge medications will be authorized once you are discharged, as it is imperative that you return to your primary care physician (or establish a relationship with a primary care physician if you do not have one) for your post hospital discharge needs so that they can reassess your need for medications and monitor your lab  values.   1.)  Protonix twice a day for 3 months, followed by once daily dosing  2.)  Your dosage of atenolol has been reduced to 12.5 mg daily due to orthostatic hypotension, please check your blood pressure at home-if your blood pressure readings are in the 767H systolic range-could consider holding atenolol for the day.  If your blood pressure stays persistently elevated-your dosage of atenolol may need to be adjusted by your primary care practitioner.  3.)  5 additional days of isolation for COVID-19 infection.   Increase activity slowly   Complete by: As directed       Allergies as of 11/01/2020       Reactions   Diflunisal    REACTION: rash , swelling  anaphlaxsis   Lipitor [atorvastatin]    Pt had body joint pain   Statins    Myalgia    Tetanus Toxoid    REACTION: arm swelling, fever        Medication List     STOP taking these medications    amoxicillin 500 MG capsule Commonly known as: AMOXIL   ciprofloxacin 500 MG tablet Commonly known as: CIPRO  dexlansoprazole 60 MG capsule Commonly known as: DEXILANT   hydrochlorothiazide 25 MG tablet Commonly known as: HYDRODIURIL   ibuprofen 200 MG tablet Commonly known as: ADVIL       TAKE these medications    acetaminophen 500 MG tablet Commonly known as: TYLENOL Take 1,000 mg by mouth every 6 (six) hours as needed for moderate pain or headache.   ALPRAZolam 1 MG tablet Commonly known as: XANAX TAKE 1 TABLET BY MOUTH AT BEDTIME AS NEEDED What changed: reasons to take this   atenolol 25 MG tablet Commonly known as: TENORMIN Take 0.5 tablets (12.5 mg total) by mouth daily. What changed:  medication strength See the new instructions.   Centrum Silver 50+Women Tabs Take 1 tablet by mouth daily.   PRESERVISION AREDS 2 PO Take 1 tablet by mouth in the morning and at bedtime.   clopidogrel 75 MG tablet Commonly known as: PLAVIX Take 1 tablet (75 mg total) by mouth daily. Start taking on: November 03, 2020 What changed: These instructions start on November 03, 2020. If you are unsure what to do until then, ask your doctor or other care provider.   ferrous sulfate 325 (65 FE) MG tablet Take 1 tablet (325 mg total) by mouth 2 (two) times daily with a meal.   fluticasone 50 MCG/ACT nasal spray Commonly known as: FLONASE Place 2 sprays into both nostrils daily.   nitroGLYCERIN 0.4 MG SL tablet Commonly known as: Nitrostat PLACE ONE TABLET UNDER THE TONGUE EVERY 5 MINUTES AS NEEDED. What changed:  how much to take how to take this when to take this reasons to take this additional instructions   pantoprazole 40 MG tablet Commonly known as: PROTONIX Take 1 tablet (40 mg total) by mouth 2 (two) times daily.   rOPINIRole 3 MG tablet Commonly known as: REQUIP Take 3 mg by mouth at bedtime as needed (rls).   SYSTANE OP Place 1 drop into both eyes daily as needed (dry eyes).   vitamin B-12 500 MCG tablet Commonly known as: CYANOCOBALAMIN Take 500 mcg by mouth daily.        Follow-up Information     Dorothyann Peng, NP. Schedule an appointment as soon as possible for a visit in 1 week(s).   Specialty: Family Medicine Contact information: [place] [address] [phone]         Mauri Pole, MD. Schedule an appointment as soon as possible for a visit in 1 month(s).   Specialty: Gastroenterology Why: Hospital follow up, If you dont hear from them,please give them a call Contact information: [place] [address] [phone]                Allergies  Allergen Reactions   Diflunisal     REACTION: rash , swelling  anaphlaxsis   Lipitor [Atorvastatin]     Pt had body joint pain   Statins     Myalgia     Tetanus Toxoid     REACTION: arm swelling, fever      Consultations:  GI   Other Procedures/Studies: DG Chest 2 View  Result Date: 10/28/2020 CLINICAL DATA:  Cold-like symptoms for 2 weeks, fatigue  EXAM: CHEST - 2 VIEW COMPARISON:  Radiograph 11/01/2015 FINDINGS: Some mild airways thickening and increasing interstitial opacity within the lungs without focal consolidative density. No pneumothorax or visible effusion. Pulmonary vascularity is normally distributed. Stable cardiomediastinal contours. No acute osseous or soft tissue abnormality. Dextrocurvature of the spine is  similar to priors. IMPRESSION: Mild airways thickening increasing interstitial opacity in the lungs, can be seen with atypical/viral infections. Electronically Signed   By: Lovena Le M.D.   On: 10/28/2020 18:25   DG Chest Port 1 View  Result Date: 10/29/2020 CLINICAL DATA:  Shortness of breath in a patient who is COVID-19 positive. EXAM: PORTABLE CHEST 1 VIEW COMPARISON:  PA and lateral chest 10/28/2020. FINDINGS: Lungs are clear. Heart size is normal. No pneumothorax or pleural fluid. No acute or focal bony abnormality. Small hiatal hernia noted. IMPRESSION: No acute disease. Electronically Signed   By: Inge Rise M.D.   On: 10/29/2020 08:24     TODAY-DAY OF DISCHARGE:  Subjective:   Shelley Coleman today has no headache,no chest abdominal pain,no new weakness tingling or numbness, feels much better wants to go home today.   Objective:   Blood pressure (!) 127/95, pulse 66, temperature 97.9 F (36.6 C), temperature source Oral, resp. rate 18, height 5\' 7"  (1.702 m), weight 89.2 kg, SpO2 98 %.  Intake/Output Summary (Last 24 hours) at 11/01/2020 1115 Last data filed at 11/01/2020 0900 Gross per 24 hour  Intake 1079.92 ml  Output --  Net 1079.92 ml   Filed Weights   10/29/20 0415 10/31/20 0518  Weight: 88.4 kg 89.2 kg    Exam: Awake Alert, Oriented *3, No new F.N deficits, Normal affect [place].AT,PERRAL Supple Neck,No JVD, No cervical lymphadenopathy appriciated.  Symmetrical Chest wall movement, Good air movement bilaterally, CTAB RRR,No Gallops,Rubs or new Murmurs, No Parasternal Heave +ve B.Sounds, Abd Soft,  Non tender, No organomegaly appriciated, No rebound -guarding or rigidity. No Cyanosis, Clubbing or edema, No new Rash or bruise   PERTINENT RADIOLOGIC STUDIES: No results found.   PERTINENT LAB RESULTS: CBC: Recent Labs    10/31/20 0205 11/01/20 0130  WBC 3.5* 4.0  HGB 9.3* 9.6*  HCT 30.2* 31.3*  PLT 181 195   CMET CMP     Component Value Date/Time   NA 138 10/31/2020 0205   K 3.6 10/31/2020 0205   CL 106 10/31/2020 0205   CO2 25 10/31/2020 0205   GLUCOSE 100 (H) 10/31/2020 0205   BUN 13 10/31/2020 0205   CREATININE 0.58 10/31/2020 0205   CREATININE 0.77 04/28/2016 1552   CALCIUM 8.4 (L) 10/31/2020 0205   PROT 5.5 (L) 10/31/2020 0205   ALBUMIN 3.1 (L) 10/31/2020 0205   AST 19 10/31/2020 0205   ALT 13 10/31/2020 0205   ALKPHOS 62 10/31/2020 0205   BILITOT 0.5 10/31/2020 0205   GFRNONAA >60 10/31/2020 0205   GFRAA >60 11/02/2015 0524    GFR Estimated Creatinine Clearance: 66.4 mL/min (by C-G formula based on SCr of 0.58 mg/dL). No results for input(s): LIPASE, AMYLASE in the last 72 hours. No results for input(s): CKTOTAL, CKMB, CKMBINDEX, TROPONINI in the last 72 hours. Invalid input(s): POCBNP Recent Labs    10/30/20 0112  DDIMER 0.82*   No results for input(s): HGBA1C in the last 72 hours. No results for input(s): CHOL, HDL, LDLCALC, TRIG, CHOLHDL, LDLDIRECT in the last 72 hours. No results for input(s): TSH, T4TOTAL, T3FREE, THYROIDAB in the last 72 hours.  Invalid input(s): FREET3 No results for input(s): VITAMINB12, FOLATE, FERRITIN, TIBC, IRON, RETICCTPCT in the last 72 hours. Coags: No results for input(s): INR in the last 72 hours.  Invalid input(s): PT Microbiology: Recent Results (from the past 240 hour(s))  Urine culture     Status: Abnormal   Collection Time: 10/28/20 12:49 AM   Specimen: Urine,  Random  Result Value Ref Range Status   Specimen Description URINE, RANDOM  Final   Special Requests   Final    NONE Performed at [facility], [address], [place], [place] [address]    Culture >=100,000 COLONIES/mL KLEBSIELLA PNEUMONIAE (A)  Final   Report Status 10/31/2020 FINAL  Final   Organism ID, Bacteria KLEBSIELLA PNEUMONIAE (A)  Final      Susceptibility   Klebsiella pneumoniae - MIC*    AMPICILLIN >=32 RESISTANT Resistant     CEFAZOLIN <=4 SENSITIVE Sensitive     CEFEPIME <=0.12 SENSITIVE Sensitive     CEFTRIAXONE <=0.25 SENSITIVE Sensitive     CIPROFLOXACIN <=0.25 SENSITIVE Sensitive     GENTAMICIN 2 SENSITIVE Sensitive     IMIPENEM 0.5 SENSITIVE Sensitive     NITROFURANTOIN >=512 RESISTANT Resistant     TRIMETH/SULFA <=20 SENSITIVE Sensitive     AMPICILLIN/SULBACTAM 16 INTERMEDIATE Intermediate     * >=100,000 COLONIES/mL KLEBSIELLA PNEUMONIAE  Resp Panel by RT-PCR (Flu A&B, Covid) Nasopharyngeal Swab     Status: Abnormal   Collection Time: 10/28/20 11:36 PM   Specimen: Nasopharyngeal Swab; Nasopharyngeal(NP) swabs in vial transport medium  Result Value Ref Range Status   SARS Coronavirus 2 by RT PCR POSITIVE (A) NEGATIVE Final    Comment: RESULT CALLED TO, READ BACK BY AND VERIFIED WITH: L BALDWIN RN 10/29/20 0122 JDW (NOTE) SARS-CoV-2 target nucleic acids are DETECTED.  The SARS-CoV-2 RNA is generally detectable in upper respiratory specimens during the acute phase of infection. Positive results are indicative of the presence of the identified virus, but do not rule out bacterial infection or co-infection with other pathogens not detected by the test. Clinical correlation with patient history and other diagnostic information is necessary to determine patient infection status. The expected result is Negative.  Fact Sheet for Patients: EntrepreneurPulse.com.au  Fact Sheet for Healthcare Providers: IncredibleEmployment.be  This test is not yet approved or cleared by the [place] FDA and  has been authorized for detection and/or diagnosis of SARS-CoV-2  by FDA under an Emergency Use Authorization (EUA).  This EUA will remain in effect (meaning this test can be used ) for the duration of  the COVID-19 declaration under Section 564(b)(1) of the Act, 21 U.S.C. section 360bbb-3(b)(1), unless the authorization is terminated or revoked sooner.     Influenza A by PCR NEGATIVE NEGATIVE Final   Influenza B by PCR NEGATIVE NEGATIVE Final    Comment: (NOTE) The Xpert Xpress SARS-CoV-2/FLU/RSV plus assay is intended as an aid in the diagnosis of influenza from Nasopharyngeal swab specimens and should not be used as a sole basis for treatment. Nasal washings and aspirates are unacceptable for Xpert Xpress SARS-CoV-2/FLU/RSV testing.  Fact Sheet for Patients: EntrepreneurPulse.com.au  Fact Sheet for Healthcare Providers: IncredibleEmployment.be  This test is not yet approved or cleared by the [place] FDA and has been authorized for detection and/or diagnosis of SARS-CoV-2 by FDA under an Emergency Use Authorization (EUA). This EUA will remain in effect (meaning this test can be used) for the duration of the COVID-19 declaration under Section 564(b)(1) of the Act, 21 U.S.C. section 360bbb-3(b)(1), unless the authorization is terminated or revoked.  Performed at [facility], [place] [address], [place], [place] [address]   Culture, blood (Routine X 2) w Reflex to ID Panel     Status: None (Preliminary result)   Collection Time: 10/29/20  6:25 AM   Specimen: BLOOD LEFT ARM  Result Value Ref  Range Status   Specimen Description BLOOD LEFT ARM  Final   Special Requests AEROBIC BOTTLE ONLY Blood Culture adequate volume  Final   Culture   Final    NO GROWTH 3 DAYS Performed at [facility], [address] [place], [place] [address]    Report Status PENDING  Incomplete  Culture, blood (Routine X 2) w Reflex to ID Panel     Status: None (Preliminary result)   Collection Time: 10/29/20  6:32 AM    Specimen: BLOOD  Result Value Ref Range Status   Specimen Description BLOOD LEFT ANTECUBITAL  Final   Special Requests AEROBIC BOTTLE ONLY Blood Culture adequate volume  Final   Culture   Final    NO GROWTH 3 DAYS Performed at [facility], [place] [address], [place], [place] [address]    Report Status PENDING  Incomplete    FURTHER DISCHARGE INSTRUCTIONS:  Get Medicines reviewed and adjusted: Please take all your medications with you for your next visit with your Primary MD  Laboratory/radiological data: Please request your Primary MD to go over all hospital tests and procedure/radiological results at the follow up, please ask your Primary MD to get all Hospital records sent to his/her office.  In some cases, they will be blood work, cultures and biopsy results pending at the time of your discharge. Please request that your primary care M.D. goes through all the records of your hospital data and follows up on these results.  Also Note the following: If you experience worsening of your admission symptoms, develop shortness of breath, life threatening emergency, suicidal or homicidal thoughts you must seek medical attention immediately by calling 911 or calling your MD immediately  if symptoms less severe.  You must read complete instructions/literature along with all the possible adverse reactions/side effects for all the Medicines you take and that have been prescribed to you. Take any new Medicines after you have completely understood and accpet all the possible adverse reactions/side effects.   Do not drive when taking Pain medications or sleeping medications (Benzodaizepines)  Do not take more than prescribed Pain, Sleep and Anxiety Medications. It is not advisable to combine anxiety,sleep and pain medications without talking with your primary care practitioner  Special Instructions: If you have smoked or chewed Tobacco  in the last 2 yrs please stop smoking, stop any regular  Alcohol  and or any Recreational drug use.  Wear Seat belts while driving.  Please note: You were cared for by a hospitalist during your hospital stay. Once you are discharged, your primary care physician will handle any further medical issues. Please note that NO REFILLS for any discharge medications will be authorized once you are discharged, as it is imperative that you return to your primary care physician (or establish a relationship with a primary care physician if you do not have one) for your post hospital discharge needs so that they can reassess your need for medications and monitor your lab values.  Total Time spent coordinating discharge including counseling, education and face to face time equals 35 minutes.  Signed: Zaray Gatchel 11/01/2020 11:15 AM

## 2020-11-01 NOTE — Discharge Instructions (Signed)
Person Under Monitoring Name: Shelley Coleman  Location: [place] [address]   Infection Prevention Recommendations for Individuals Confirmed to have, or Being Evaluated for, 2019 Novel Coronavirus (COVID-19) Infection Who Receive Care at Home  Individuals who are confirmed to have, or are being evaluated for, COVID-19 should follow the prevention steps below until a healthcare provider or local or state health department says they can return to normal activities.  Stay home except to get medical care You should restrict activities outside your home, except for getting medical care. Do not go to work, school, or public areas, and do not use public transportation or taxis.  Call ahead before visiting your doctor Before your medical appointment, call the healthcare provider and tell them that you have, or are being evaluated for, COVID-19 infection. This will help the healthcare provider's office take steps to keep other people from getting infected. Ask your healthcare provider to call the local or state health department.  Monitor your symptoms Seek prompt medical attention if your illness is worsening (e.g., difficulty breathing). Before going to your medical appointment, call the healthcare provider and tell them that you have, or are being evaluated for, COVID-19 infection. Ask your healthcare provider to call the local or state health department.  Wear a facemask You should wear a facemask that covers your nose and mouth when you are in the same room with other people and when you visit a healthcare provider. People who live with or visit you should also wear a facemask while they are in the same room with you.  Separate yourself from other people in your home As much as possible, you should stay in a different room from other people in your home. Also, you should use a separate bathroom, if available.  Avoid sharing household items You should not  share dishes, drinking glasses, cups, eating utensils, towels, bedding, or other items with other people in your home. After using these items, you should wash them thoroughly with soap and water.  Cover your coughs and sneezes Cover your mouth and nose with a tissue when you cough or sneeze, or you can cough or sneeze into your sleeve. Throw used tissues in a lined trash can, and immediately wash your hands with soap and water for at least 20 seconds or use an alcohol-based hand rub.  Wash your [facility] your hands often and thoroughly with soap and water for at least 20 seconds. You can use an alcohol-based hand sanitizer if soap and water are not available and if your hands are not visibly dirty. Avoid touching your eyes, nose, and mouth with unwashed hands.   Prevention Steps for Caregivers and Household Members of Individuals Confirmed to have, or Being Evaluated for, COVID-19 Infection Being Cared for in the Home  If you live with, or provide care at home for, a person confirmed to have, or being evaluated for, COVID-19 infection please follow these guidelines to prevent infection:  Follow healthcare provider's instructions Make sure that you understand and can help the patient follow any healthcare provider instructions for all care.  Provide for the patient's basic needs You should help the patient with basic needs in the home and provide support for getting groceries, prescriptions, and other personal needs.  Monitor the patient's symptoms If they are getting sicker, call his or her medical provider and tell them that the patient has, or is being evaluated for, COVID-19 infection. This will help the healthcare provider's office  take steps to keep other people from getting infected. Ask the healthcare provider to call the local or state health department.  Limit the number of people who have contact with the patient If possible, have only one caregiver for the  patient. Other household members should stay in another home or place of residence. If this is not possible, they should stay in another room, or be separated from the patient as much as possible. Use a separate bathroom, if available. Restrict visitors who do not have an essential need to be in the home.  Keep older adults, very young children, and other sick people away from the patient Keep older adults, very young children, and those who have compromised immune systems or chronic health conditions away from the patient. This includes people with chronic heart, lung, or kidney conditions, diabetes, and cancer.  Ensure good ventilation Make sure that shared spaces in the home have good air flow, such as from an air conditioner or an opened window, weather permitting.  Wash your hands often Wash your hands often and thoroughly with soap and water for at least 20 seconds. You can use an alcohol based hand sanitizer if soap and water are not available and if your hands are not visibly dirty. Avoid touching your eyes, nose, and mouth with unwashed hands. Use disposable paper towels to dry your hands. If not available, use dedicated cloth towels and replace them when they become wet.  Wear a facemask and gloves Wear a disposable facemask at all times in the room and gloves when you touch or have contact with the patient's blood, body fluids, and/or secretions or excretions, such as sweat, saliva, sputum, nasal mucus, vomit, urine, or feces.  Ensure the mask fits over your nose and mouth tightly, and do not touch it during use. Throw out disposable facemasks and gloves after using them. Do not reuse. Wash your hands immediately after removing your facemask and gloves. If your personal clothing becomes contaminated, carefully remove clothing and launder. Wash your hands after handling contaminated clothing. Place all used disposable facemasks, gloves, and other waste in a lined container before  disposing them with other household waste. Remove gloves and wash your hands immediately after handling these items.  Do not share dishes, glasses, or other household items with the patient Avoid sharing household items. You should not share dishes, drinking glasses, cups, eating utensils, towels, bedding, or other items with a patient who is confirmed to have, or being evaluated for, COVID-19 infection. After the person uses these items, you should wash them thoroughly with soap and water.  Wash laundry thoroughly Immediately remove and wash clothes or bedding that have blood, body fluids, and/or secretions or excretions, such as sweat, saliva, sputum, nasal mucus, vomit, urine, or feces, on them. Wear gloves when handling laundry from the patient. Read and follow directions on labels of laundry or clothing items and detergent. In general, wash and dry with the warmest temperatures recommended on the label.  Clean all areas the individual has used often Clean all touchable surfaces, such as counters, tabletops, doorknobs, bathroom fixtures, toilets, phones, keyboards, tablets, and bedside tables, every day. Also, clean any surfaces that may have blood, body fluids, and/or secretions or excretions on them. Wear gloves when cleaning surfaces the patient has come in contact with. Use a diluted bleach solution (e.g., dilute bleach with 1 part bleach and 10 parts water) or a household disinfectant with a label that says EPA-registered for coronaviruses. To make a bleach  solution at home, add 1 tablespoon of bleach to 1 quart (4 cups) of water. For a larger supply, add  cup of bleach to 1 gallon (16 cups) of water. Read labels of cleaning products and follow recommendations provided on product labels. Labels contain instructions for safe and effective use of the cleaning product including precautions you should take when applying the product, such as wearing gloves or eye protection and making sure you  have good ventilation during use of the product. Remove gloves and wash hands immediately after cleaning.  Monitor yourself for signs and symptoms of illness Caregivers and household members are considered close contacts, should monitor their health, and will be asked to limit movement outside of the home to the extent possible. Follow the monitoring steps for close contacts listed on the symptom monitoring form.   ? If you have additional questions, contact your local health department or call the epidemiologist on call at [phone] (available 24/7). ? This guidance is subject to change. For the most up-to-date guidance from [facility], please refer to their website: [email or web address]

## 2020-11-01 NOTE — Progress Notes (Signed)
Physical Therapy Treatment/Discharge  Patient Details Name: STARASIA SINKO MRN: [id number] DOB: 04-Jan-1941 Today's Date: 11/01/2020    History of Present Illness Pt is 80 yo female who presented on 10/28/20 with weakness and shortness of breath.  Pt found to be COVID + with symptomatic microcytic anemia. Pt with GI consult with Plan for EGD and colonscopy.  Medical hx includes HLD, RLS, CHF, prior back sx, L THA.    PT Comments    Pt doing well with mobility and no further PT needed.  Ready for dc from PT standpoint.    Follow Up Recommendations  No PT follow up     Equipment Recommendations  None recommended by PT    Recommendations for Other Services       Precautions / Restrictions Precautions Precautions: None    Mobility  Bed Mobility               General bed mobility comments: Pt up on EOB    Transfers Overall transfer level: Independent Equipment used: None Transfers: Sit to/from Stand Sit to Stand: Independent         General transfer comment: Steady to rise  Ambulation/Gait Ambulation/Gait assistance: Independent Gait Distance (Feet): 200 Feet Assistive device: None Gait Pattern/deviations: Step-through pattern Gait velocity: decr due to limited space Gait velocity interpretation: 1.31 - 2.62 ft/sec, indicative of limited community ambulator General Gait Details: Steady gait. Limited to back and forth in room due to covid precautions   Stairs             Wheelchair Mobility    Modified Rankin (Stroke Patients Only)       Balance Overall balance assessment: Independent                                          Cognition Arousal/Alertness: Awake/alert Behavior During Therapy: WFL for tasks assessed/performed Overall Cognitive Status: Within Functional Limits for tasks assessed                                        Exercises      General Comments General comments (skin integrity, edema,  etc.): Instructed in repeated sit to stands for strengthening for home      Pertinent Vitals/Pain Pain Assessment: No/denies pain    Home Living                      Prior Function            PT Goals (current goals can now be found in the care plan section) Acute Rehab PT Goals Patient Stated Goal: regain strength Progress towards PT goals: Goals met/education completed, patient discharged from PT    Frequency           PT Plan Other (comment) (DC from PT)    Co-evaluation              AM-PAC PT "6 Clicks" Mobility   Outcome Measure  Help needed turning from your back to your side while in a flat bed without using bedrails?: None Help needed moving from lying on your back to sitting on the side of a flat bed without using bedrails?: None Help needed moving to and from a bed to a chair (including a wheelchair)?: None Help needed standing  up from a chair using your arms (e.g., wheelchair or bedside chair)?: None Help needed to walk in hospital room?: None Help needed climbing 3-5 steps with a railing? : None 6 Click Score: 24    End of Session   Activity Tolerance: Patient tolerated treatment well Patient left: in bed;with call bell/phone within reach (sitting EOB) Nurse Communication: Mobility status       Time: 8346-2194 PT Time Calculation (min) (ACUTE ONLY): 20 min  Charges:  $Gait Training: 8-22 mins                     [place] Pager [phone] Office [place] 11/01/2020, 10:40 AM

## 2020-11-01 NOTE — TOC Transition Note (Signed)
Transition of Care [facility]) - CM/SW Discharge Note   Patient Details  Name: Shelley Coleman MRN: [id number] Date of Birth: [birth year]  Transition of Care [facility] CM/SW Contact:  Pollie Friar, RN Phone Number: 11/01/2020, 11:55 AM   Clinical Narrative:    Patient is discharging home with self care. No f/u per PT/OT and no DME needs.  Pt has transport home.   Final next level of care: Home/Self Care Barriers to Discharge: No Barriers Identified   Patient Goals and CMS Choice        Discharge Placement                       Discharge Plan and Services                                     Social Determinants of Health (SDOH) Interventions     Readmission Risk Interventions No flowsheet data found.

## 2020-11-01 NOTE — Anesthesia Postprocedure Evaluation (Signed)
Anesthesia Post Note  Patient: Shelley Coleman  Procedure(s) Performed: ESOPHAGOGASTRODUODENOSCOPY (EGD) WITH PROPOFOL HOT HEMOSTASIS (ARGON PLASMA COAGULATION/BICAP) FOREIGN BODY REMOVAL     Anesthesia Post Evaluation No notable events documented.  Last Vitals:  Vitals:   11/01/20 0358 11/01/20 0746  BP: (!) 145/74 (!) 127/95  Pulse: (!) 56 66  Resp: 19 18  Temp: 37 C 36.6 C  SpO2: 94% 98%    Last Pain:  Vitals:   11/01/20 0746  TempSrc: Oral  PainSc:                  Colin Ellers

## 2020-11-01 NOTE — Progress Notes (Signed)
Shelley Coleman to be D/C'd Home per MD order.  Discussed with the patient and all questions answered.   VSS, Skin clean, dry and intact without evidence of skin break down, no evidence of skin tears noted. IV catheter discontinued intact. Site without signs and symptoms of complications. Dressing and pressure applied.   An After Visit Summary was printed and given to the patient. Patient received pharmacy pick up information.   D/c education completed with patient/family including follow up instructions, medication list, d/c activities limitations if indicated, with other d/c instructions as indicated by MD - patient able to verbalize understanding, all questions answered.    Patient instructed to return to ED, call 911, or call MD for any changes in condition.    Patient escorted via [place], and D/C home via private auto.                 Note Details  Author Dene Gentry, RN File Time 09/24/2020  1:26 PM  Author Type Registered Nurse Status Incomplete  Last Editor Dene Gentry, RN Service (none)  [place] # [id number] Admit Date 09/21/2020

## 2020-11-03 LAB — CULTURE, BLOOD (ROUTINE X 2)
Culture: NO GROWTH
Culture: NO GROWTH
Special Requests: ADEQUATE
Special Requests: ADEQUATE

## 2020-11-05 ENCOUNTER — Encounter: Payer: Self-pay | Admitting: Nurse Practitioner

## 2020-11-08 ENCOUNTER — Other Ambulatory Visit: Payer: Self-pay

## 2020-11-09 ENCOUNTER — Encounter: Payer: Self-pay | Admitting: Adult Health

## 2020-11-09 ENCOUNTER — Other Ambulatory Visit: Payer: Self-pay

## 2020-11-09 ENCOUNTER — Ambulatory Visit (INDEPENDENT_AMBULATORY_CARE_PROVIDER_SITE_OTHER): Payer: Medicare Other | Admitting: Adult Health

## 2020-11-09 VITALS — BP 120/78 | HR 60 | Temp 97.9°F | Wt 175.2 lb

## 2020-11-09 DIAGNOSIS — U071 COVID-19: Secondary | ICD-10-CM | POA: Diagnosis not present

## 2020-11-09 DIAGNOSIS — I10 Essential (primary) hypertension: Secondary | ICD-10-CM | POA: Diagnosis not present

## 2020-11-09 DIAGNOSIS — G2581 Restless legs syndrome: Secondary | ICD-10-CM | POA: Diagnosis not present

## 2020-11-09 DIAGNOSIS — D500 Iron deficiency anemia secondary to blood loss (chronic): Secondary | ICD-10-CM | POA: Diagnosis not present

## 2020-11-09 DIAGNOSIS — I5032 Chronic diastolic (congestive) heart failure: Secondary | ICD-10-CM | POA: Diagnosis not present

## 2020-11-09 DIAGNOSIS — I679 Cerebrovascular disease, unspecified: Secondary | ICD-10-CM

## 2020-11-09 LAB — CBC WITH DIFFERENTIAL/PLATELET
Basophils Absolute: 0.1 10*3/uL (ref 0.0–0.1)
Basophils Relative: 1 % (ref 0.0–3.0)
Eosinophils Absolute: 0.1 10*3/uL (ref 0.0–0.7)
Eosinophils Relative: 1.2 % (ref 0.0–5.0)
HCT: 36.6 % (ref 36.0–46.0)
Hemoglobin: 11.5 g/dL — ABNORMAL LOW (ref 12.0–15.0)
Lymphocytes Relative: 27.9 % (ref 12.0–46.0)
Lymphs Abs: 2 10*3/uL (ref 0.7–4.0)
MCHC: 31.4 g/dL (ref 30.0–36.0)
MCV: 74.5 fl — ABNORMAL LOW (ref 78.0–100.0)
Monocytes Absolute: 0.6 10*3/uL (ref 0.1–1.0)
Monocytes Relative: 8.9 % (ref 3.0–12.0)
Neutro Abs: 4.4 10*3/uL (ref 1.4–7.7)
Neutrophils Relative %: 61 % (ref 43.0–77.0)
Platelets: 281 10*3/uL (ref 150.0–400.0)
RBC: 4.92 Mil/uL (ref 3.87–5.11)
RDW: 25.9 % — ABNORMAL HIGH (ref 11.5–15.5)
WBC: 7.2 10*3/uL (ref 4.0–10.5)

## 2020-11-09 LAB — BASIC METABOLIC PANEL
BUN: 20 mg/dL (ref 6–23)
CO2: 29 mEq/L (ref 19–32)
Calcium: 10.2 mg/dL (ref 8.4–10.5)
Chloride: 101 mEq/L (ref 96–112)
Creatinine, Ser: 0.84 mg/dL (ref 0.40–1.20)
GFR: 66.59 mL/min (ref >60.00)
Glucose, Bld: 90 mg/dL (ref 70–99)
Potassium: 4.9 mEq/L (ref 3.5–5.1)
Sodium: 137 mEq/L (ref 135–145)

## 2020-11-09 NOTE — Progress Notes (Signed)
Subjective:    Patient ID: Shelley Coleman, female    DOB: 1940/08/10, 80 y.o.   MRN: [id number]  HPI 80 year old female who  has a past medical history of Anemia, Breast cancer ([place]), Cerebrovascular disease (11/12/2018), Depression, Depression, GERD (gastroesophageal reflux disease), Hiatal hernia, History of GI bleed, mitral valve prolapse, Hyperlipidemia, Irregular heart beat, Neuropathy, Nocturnal leg cramps (09/02/2015), Optic neuritis, Osteopenia, Ovarian cyst, Parotid tumor, Pelvic fracture ([place]), Restless legs syndrome, Shingles (2000), and Stroke ([place]).   She is presenting to the office today for TCM visit  Admit Date 10/28/2020  Discharge Date: 11/01/2020  She presented to [facility] emergency room with complaints of shortness of breath and weakness, she was found to have an upper GI bleeding with acute blood loss anemia and incidental COVID-19 infection.  Upper GI bleeding with acute blood loss anemia  -Had no further melena, hemoglobin stable.  She did receive 1 unit PRBC on initial presentation.  EGD on 7 2 showed Cameron lesions/AVMs-s/p APC.  GI recommending to continue PPI twice daily for 3 months followed by once daily.  Was advised to minimize NSAIDs as much as possible  Headedness/dizzy -Felt likely due to orthostatic hypotension-managed with IV fluids-atenolol dose reduced to 12.5 mg daily.  Repeat orthostatic vitals on 7 4 were much better and patient was ambulating in the room without any issue  COVID-19 infection -Incidental- no symptoms-not hypoxic.  Chest x-ray did not show pneumonia.  Completed remdesivir x3 days.   HTN -BP stable-on atenolol-decrease dosage to 12.5 mg due to concern of orthostatic.  HCTZ remains on hold  HFpEF  -IM status stable-on beta-blocker  History of CVA  -Plavix was held, patient with no focal deficits.  Per Wallace Going to resume Plavix x3 days from day of endoscopy  RLS  - Continued requip   Symptomatic bacteriuria  -Urine culture growing  gram-negative bacteria-does not appear to be having UTI symptoms.  Monitor off antibiotics  Today she reports that she is feeling better but continues to be fatigued.  She has not noticed any blood in her stool but does have some mild abdominal discomfort across to her abdomen today, wills are not as dark as when she was admitted to the hospital.  She has been monitoring her blood pressure at home with readings consistently in the 299M to 426S systolic, she has not restarted HCTZ and continues at 12-1/2 mg of atenolol.  He is trying to stay well-hydrated, she does not have much of an appetite due to loss of taste from COVID but she is trying to snack throughout the day.  A follow-up appointment with gastroenterology on December 06, 2020   Review of Systems  Constitutional:  Positive for fatigue.  HENT: Negative.    Eyes: Negative.   Respiratory: Negative.    Cardiovascular: Negative.   Gastrointestinal: Negative.   Endocrine: Negative.   Genitourinary: Negative.   Musculoskeletal: Negative.   Skin: Negative.   Allergic/Immunologic: Negative.   Neurological: Negative.   Hematological: Negative.   Psychiatric/Behavioral: Negative.    All other systems reviewed and are negative.   Past Medical History:  Diagnosis Date   Anemia    Has had iron infusions   Breast cancer ([place])    right   Cerebrovascular disease 11/12/2018   Depression    Depression    GERD (gastroesophageal reflux disease)    Hiatal hernia    History of GI bleed    Hx of mitral valve prolapse    Hyperlipidemia  Irregular heart beat    Neuropathy    numbness in both feet   Nocturnal leg cramps 09/02/2015   Optic neuritis    left   Osteopenia    Ovarian cyst    Parotid tumor    right   Pelvic fracture (HCC)    Restless legs syndrome    Shingles 2000   Left in V1   Stroke [facility])     Social History   Socioeconomic History   Marital status: Married    Spouse name: Not on file   Number of children: 2    Years of education: 16   Highest education level: Not on file  Occupational History   Occupation: Retired Therapist, sports  Tobacco Use   Smoking status: Never   Smokeless tobacco: Never  Substance and Sexual Activity   Alcohol use: Yes    Alcohol/week: 1.0 standard drink    Types: 1 Glasses of wine per week    Comment: Rarely,socially   Drug use: No   Sexual activity: Not on file  Other Topics Concern   Not on file  Social History Narrative   Retired from nursing    Widowed    Left-handed      Social Determinants of Radio broadcast assistant Strain: Low Risk    Difficulty of Paying Living Expenses: Not hard at all  Food Insecurity: No Food Insecurity   Worried About Charity fundraiser in the Last Year: Never true   Arboriculturist in the Last Year: Never true  Transportation Needs: No Transportation Needs   Lack of Transportation (Medical): No   Lack of Transportation (Non-Medical): No  Physical Activity: Inactive   Days of Exercise per Week: 0 days   Minutes of Exercise per Session: 0 min  Stress: No Stress Concern Present   Feeling of Stress : Not at all  Social Connections: Moderately Isolated   Frequency of Communication with Friends and Family: More than three times a week   Frequency of Social Gatherings with Friends and Family: More than three times a week   Attends Religious Services: More than 4 times per year   Active Member of [employer] or Organizations: No   Attends Archivist Meetings: Never   Marital Status: Widowed  Human resources officer Violence: Not At Risk   Fear of Current or Ex-Partner: No   Emotionally Abused: No   Physically Abused: No   Sexually Abused: No    Past Surgical History:  Procedure Laterality Date   ABDOMINAL HYSTERECTOMY     APPENDECTOMY     BREAST LUMPECTOMY Left    CATARACT EXTRACTION     ESOPHAGEAL MANOMETRY N/A 01/13/2013   Procedure: ESOPHAGEAL MANOMETRY (EM);  Surgeon: Sable Feil, MD;  Location: [facility];  Service:  Endoscopy;  Laterality: N/A;   ESOPHAGOGASTRODUODENOSCOPY (EGD) WITH PROPOFOL N/A 10/30/2020   Procedure: ESOPHAGOGASTRODUODENOSCOPY (EGD) WITH PROPOFOL;  Surgeon: Mauri Pole, MD;  Location: [place] ENDOSCOPY;  Service: Endoscopy;  Laterality: N/A;   FOREIGN BODY REMOVAL  10/30/2020   Procedure: FOREIGN BODY REMOVAL;  Surgeon: Mauri Pole, MD;  Location: [place];  Service: Endoscopy;;   HERNIA REPAIR  08/2009   HOT HEMOSTASIS N/A 10/30/2020   Procedure: HOT HEMOSTASIS (ARGON PLASMA COAGULATION/BICAP);  Surgeon: Mauri Pole, MD;  Location: [place] ENDOSCOPY;  Service: Endoscopy;  Laterality: N/A;   MASTECTOMY Right    SPINE SURGERY     TOTAL HIP ARTHROPLASTY Left     Family History  Problem Relation Age of Onset   Cancer Mother        Angio sarcoma    Heart disease Father    Hypertension Father    Stroke Father        x2   Heart attack Father        x 11   Hernia Brother    Hypertension Brother     Allergies  Allergen Reactions   Diflunisal     REACTION: rash , swelling  anaphlaxsis   Lipitor [Atorvastatin]     Pt had body joint pain   Statins     Myalgia     Tetanus Toxoid     REACTION: arm swelling, fever    Current Outpatient Medications on File Prior to Visit  Medication Sig Dispense Refill   acetaminophen (TYLENOL) 500 MG tablet Take 1,000 mg by mouth every 6 (six) hours as needed for moderate pain or headache.     ALPRAZolam (XANAX) 1 MG tablet TAKE 1 TABLET BY MOUTH AT BEDTIME AS NEEDED 30 tablet 1   atenolol (TENORMIN) 25 MG tablet Take 0.5 tablets (12.5 mg total) by mouth daily. 30 tablet 0   clopidogrel (PLAVIX) 75 MG tablet Take 1 tablet (75 mg total) by mouth daily.     ferrous sulfate 325 (65 FE) MG tablet Take 1 tablet (325 mg total) by mouth 2 (two) times daily with a meal. 60 tablet 0   fluticasone (FLONASE) 50 MCG/ACT nasal spray Place 2 sprays into both nostrils daily. 16 mL 0   Multiple Vitamins-Minerals (CENTRUM SILVER 50+WOMEN) TABS  Take 1 tablet by mouth daily.     Multiple Vitamins-Minerals (PRESERVISION AREDS 2 PO) Take 1 tablet by mouth in the morning and at bedtime.     nitroGLYCERIN (NITROSTAT) 0.4 MG SL tablet PLACE ONE TABLET UNDER THE TONGUE EVERY 5 MINUTES AS NEEDED. 100 tablet 0   pantoprazole (PROTONIX) 40 MG tablet Take 1 tablet (40 mg total) by mouth 2 (two) times daily. 180 tablet 0   Polyethyl Glycol-Propyl Glycol (SYSTANE OP) Place 1 drop into both eyes daily as needed (dry eyes).     rOPINIRole (REQUIP) 3 MG tablet Take 3 mg by mouth at bedtime as needed (rls).     vitamin B-12 (CYANOCOBALAMIN) 500 MCG tablet Take 500 mcg by mouth daily.     No current facility-administered medications on file prior to visit.    BP 120/78 (BP Location: Left Arm, Patient Position: Sitting, Cuff Size: Normal)   Pulse 60   Temp 97.9 F (36.6 C) (Temporal)   Wt 175 lb 3.2 oz (79.5 kg)   SpO2 96%   BMI 27.44 kg/m        Objective:   Physical Exam Vitals and nursing note reviewed.  Constitutional:      Appearance: Normal appearance.  Cardiovascular:     Rate and Rhythm: Normal rate and regular rhythm.     Pulses: Normal pulses.     Heart sounds: Normal heart sounds.  Pulmonary:     Effort: Pulmonary effort is normal.     Breath sounds: Normal breath sounds.  Abdominal:     General: Abdomen is flat. Bowel sounds are normal.     Palpations: Abdomen is soft.     Tenderness: There is abdominal tenderness in the epigastric area.  Genitourinary:    Rectum: Guaiac result negative.  Skin:    General: Skin is warm and dry.     Coloration: Skin is not pale.  Neurological:  General: No focal deficit present.     Mental Status: She is alert and oriented to person, place, and time.  Psychiatric:        Mood and Affect: Mood normal.        Behavior: Behavior normal.        Thought Content: Thought content normal.        Judgment: Judgment normal.      Assessment & Plan:  1. Iron deficiency anemia due to  chronic blood loss -Viewed hospital notes, imaging, labs, and discharge instructions with the patient.  All questions answered to the best of my ability. -Guaiac negative in the office - Continue OTC iron supplements. -Continue Protonix twice daily x3 months and then switch to once daily - Follow-up with gastroenterology as directed - CBC with Differential/Platelet; Future - Basic Metabolic Panel; Future  2. COVID-19 virus infection -Residual loss of taste, should resolve eventually  3. Essential hypertension -BP stable.  Continue with 12.5 mg of atenolol daily.  Continue to monitor blood pressure.  Blood pressure consistently elevated that can start full dose of atenolol  4. Restless legs syndrome (RLS) -Continue Requip  5. Chronic diastolic CHF (congestive heart failure) (HCC) -Continue beta-blocker.  Volume status stable  6. Cerebrovascular disease -Continue Plavix.  Dorothyann Peng, NP

## 2020-11-09 NOTE — Patient Instructions (Addendum)
It was great seeing you   We will check your labs work today   Please let me know if you need anything

## 2020-11-09 NOTE — Addendum Note (Signed)
Addended by: Amanda Cockayne on: 11/09/2020 11:59 AM   Modules accepted: Orders

## 2020-11-17 ENCOUNTER — Telehealth: Payer: Self-pay

## 2020-11-17 NOTE — Telephone Encounter (Signed)
Please advise 

## 2020-11-18 NOTE — Telephone Encounter (Signed)
Called spoke with patient, she stated that she isn't having any pain or diarrhea. Informed patient if experiencing problems go to ED, per message. Voiced understanding.

## 2020-11-18 NOTE — Telephone Encounter (Signed)
Should an appointment be scheduled with you or call GI?

## 2020-11-18 NOTE — Telephone Encounter (Signed)
Called GI, next available appointment for any PA's or NP is August 22nd, next available appointment for any  MD's is in September mainly in October.    Receptionist said if patient is having problems, she can go to ED and get a GI consultation.  Please advise.

## 2020-11-29 ENCOUNTER — Other Ambulatory Visit: Payer: Self-pay | Admitting: Adult Health

## 2020-11-30 NOTE — Telephone Encounter (Signed)
Okay for refill?    LOV 11/09/2020   Last Refill 03/04/2020     30 QTY.  1  Refills

## 2020-12-05 NOTE — Progress Notes (Signed)
12/05/2020 GOLA ARDITO [id number] [birth year]   CHIEF COMPLAINT: Fatigue, abdominal pain   HISTORY OF PRESENT ILLNESS:  Shelley Coleman is a 80 year old female with a past medical history of depression, MVP, hyperlipidemia, breast cancer s/p mastectomy and chemo, neuropathy,  diastolic CHF with LV EF 55 to 60 % in 2017, CVA on Plavix, hepatic steatosis, anemia, hiatal hernia, GERD and s/p Nissen Fundoplication surgery [id number] years ago.   She was seen by Dr. Ardis Hughs during her hospital admission 10/29/2020 due to having dyspnea with anemia and watery loose dark coffee colored stools on Plavix secondary to a past CVA. Her admission Hg level was 7.8. Iron 22. Iron saturation 5%. FOBT +. Covid 19 test was positive, she was asymptomatic. A chest xray was negative. She was transfused with 1 unit of PRBCs.  She underwent an EGD by Dr. Silverio Decamp 10/30/2020 which showed a large hiatal hernia, gastric Cameron erosions without Sigmond of recent bleeding and a single nonbleeding AVM in the duodenum was treated with APC.  She was advised to continue Pantoprazole 40 mg p.o. twice daily for 3 months then daily thereafter, to resume Plavix in 3 days and to follow-up in our office in 4 to 6 weeks.  She was discharged home on 11/01/2020, her hemoglobin level was 9.3 at discharge.  She was seen by her PCP Dorothyann Peng NP for hospital follow-up and laboratory studies were done 11/09/2020  which showed Hemoglobin level up to 11.5 and Hematocrit 36.6.   She presents to our office for GI follow-up.  She continues to feel quite fatigued and she continues to have shortness of breath with exertion.  No chest pain or palpitations.  He stated her SBP was less than 110 on a few readings so she held her Atenolol dose a fes days. Since her hospital discharge, she reported having 2 episodes of upper abdominal pain and dark coffee colored watery diarrhea which occurred on 11/16/2020 and a 2nd similar episode of epigastric pain followed by 3  episodes of dark coffee colored watery diarrhea on 11/18/2020 without further recurrence.  She is now passing a brown formed stool daily.  She denies seeing any black tarry stool or bright red rectal bleeding.  She is on Ferrous Sulfate 325 mg 1 p.o. twice daily.  She remains on Plavix.  No NSAIDs.  She is compliant with taking Pantoprazole 40 mg p.o. twice daily as prescribed.  Her most recent colonoscopy was 07/26/2009 by Dr. Sharlett Iles which showed melanosis coli, the bowel prep was poor.  Colonoscopy 03/12/1996 showed a tortuous sigmoid colon without polyps.  No known family history of esophageal, gastric or colon cancer.    CBC Latest Ref Rng & Units 11/09/2020 11/01/2020 10/31/2020  WBC 4.0 - 10.5 K/uL 7.2 4.0 3.5(L)  Hemoglobin 12.0 - 15.0 g/dL 11.5(L) 9.6(L) 9.3(L)  Hematocrit 36.0 - 46.0 % 36.6 31.3(L) 30.2(L)  Platelets 150.0 - 400.0 K/uL 281.0 195 181    CMP Latest Ref Rng & Units 11/09/2020 10/31/2020 10/30/2020  Glucose 70 - 99 mg/dL 90 100(H) 85  BUN 6 - 23 mg/dL '20 13 9  '$ Creatinine 0.40 - 1.20 mg/dL 0.84 0.58 0.57  Sodium 135 - 145 mEq/L 137 138 138  Potassium 3.5 - 5.1 mEq/L 4.9 3.6 3.5  Chloride 96 - 112 mEq/L 101 106 106  CO2 19 - 32 mEq/L '29 25 25  '$ Calcium 8.4 - 10.5 mg/dL 10.2 8.4(L) 8.4(L)  Total Protein 6.5 - 8.1 g/dL - 5.5(L) 5.4(L)  Total Bilirubin 0.3 - 1.2 mg/dL - 0.5 0.9  Alkaline Phos 38 - 126 U/L - 62 64  AST 15 - 41 U/L - 19 19  ALT 0 - 44 U/L - 13 13     EGD 10/30/2020:  - Large hiatal hernia. - Food in the cardia. Removal was successful. - Gastric Cameron's erosions with no bleeding and stigmata of recent bleeding. - A single angioectasia in the duodenum. Treated with argon plasma coagulation (APC). No ibuprofen, naproxen, or other non-steroidal anti-inflammatory drugs. - Use Protonix (pantoprazole) 40 mg PO BID for 3 months followed by once daily. - Follow an antireflux regimen. - Resume Plavix (clopidogrel) at prior dose in 3 days. Refer to managing physician  for further adjustment of therapy. - Follow up in GI office in 4-6 weeks on discharge  High Resolution Motility Study 02/04/2013: Slightly increased lower esophageal sphincter pressure at slightly increased EGJ relaxation There is no evidence of impaired peristalsis to suggest achalasia, impedance is normal.  EGD 12/18/2012: The duodenal mucosa showed no abnormalities in the bulb and second portion of the duodenum There was mild antral gastropathy noted probably from heavy NSAID use The esophageal fundoplication appeared intact.  There were some erosions in the distal esophagus which were photographed.  The patient was dilated with a 52 [place] Maloney dilator without difficulty.  EGD 07/26/2009: Hiatal hernia Normal duodenum Normal esophagus Normal stomach Small bowel and clo bx done  1. SMALL INTESTINE BIOPSY: BENIGN SMALL BOWEL MUCOSA. NO VILLOUS ATROPHY,   INFLAMMATION OR OTHER ABNORMALITIES  PRESENT  Colonoscopy 07/26/2009 by Dr. Sharlett Iles:  Melanosis Coli  Poor prep  Colonoscopy 03/12/1996: Tortuous sigmoid colon  Possible adhesions   Past Medical History:  Diagnosis Date   Anemia    Has had iron infusions   Breast cancer ([place])    right   Cerebrovascular disease 11/12/2018   Depression    Depression    GERD (gastroesophageal reflux disease)    Hiatal hernia    History of GI bleed    Hx of mitral valve prolapse    Hyperlipidemia    Irregular heart beat    Neuropathy    numbness in both feet   Nocturnal leg cramps 09/02/2015   Optic neuritis    left   Osteopenia    Ovarian cyst    Parotid tumor    right   Pelvic fracture (HCC)    Restless legs syndrome    Shingles 2000   Left in V1   Stroke [facility])    Past Surgical History:  Procedure Laterality Date   ABDOMINAL HYSTERECTOMY     APPENDECTOMY     BREAST LUMPECTOMY Left    CATARACT EXTRACTION     ESOPHAGEAL MANOMETRY N/A 01/13/2013   Procedure: ESOPHAGEAL MANOMETRY (EM);  Surgeon: Sable Feil, MD;   [facility];  Service: Endoscopy;  Laterality: N/A;   ESOPHAGOGASTRODUODENOSCOPY (EGD) WITH PROPOFOL N/A 10/30/2020   Procedure: ESOPHAGOGASTRODUODENOSCOPY (EGD) WITH PROPOFOL;  Surgeon: Mauri Pole, MD;  Location: [place] ENDOSCOPY;  Service: Endoscopy;  Laterality: N/A;   FOREIGN BODY REMOVAL  10/30/2020   Procedure: FOREIGN BODY REMOVAL;  Surgeon: Mauri Pole, MD;  Location: [place];  Service: Endoscopy;;   HERNIA REPAIR  08/2009   HOT HEMOSTASIS N/A 10/30/2020   Procedure: HOT HEMOSTASIS (ARGON PLASMA COAGULATION/BICAP);  Surgeon: Mauri Pole, MD;  Location: [facility] END[facility]COPY;  Service: Endoscopy;  Laterality: N/A;   MASTECTOMY Right    SPINE SURGERY     TOTAL HIP  ARTHROPLASTY Left    Social History: She is widowed.  She is a retired Equities trader.  She has 1 son and 1 daughter.  Non-smoker.  Less than 1 alcoholic beverage daily.  Family History: Father with history of heart disease/MI, hypertension and CVA.  No known family history of esophageal, gastric or colon cancer.  Allergies  Allergen Reactions   Diflunisal     REACTION: rash , swelling  anaphlaxsis   Lipitor [Atorvastatin]     Pt had body joint pain   Statins     Myalgia     Tetanus Toxoid     REACTION: arm swelling, fever      Outpatient Encounter Medications as of 12/06/2020  Medication Sig   acetaminophen (TYLENOL) 500 MG tablet Take 1,000 mg by mouth every 6 (six) hours as needed for moderate pain or headache.   ALPRAZolam (XANAX) 1 MG tablet TAKE 1 TABLET BY MOUTH AT BEDTIME AS NEEDED   atenolol (TENORMIN) 25 MG tablet Take 0.5 tablets (12.5 mg total) by mouth daily.   clopidogrel (PLAVIX) 75 MG tablet Take 1 tablet (75 mg total) by mouth daily.   ferrous sulfate 325 (65 FE) MG tablet Take 1 tablet (325 mg total) by mouth 2 (two) times daily with a meal.   fluticasone (FLONASE) 50 MCG/ACT nasal spray Place 2 sprays into both nostrils daily.   Multiple Vitamins-Minerals (CENTRUM SILVER  50+WOMEN) TABS Take 1 tablet by mouth daily.   Multiple Vitamins-Minerals (PRESERVISION AREDS 2 PO) Take 1 tablet by mouth in the morning and at bedtime.   nitroGLYCERIN (NITROSTAT) 0.4 MG SL tablet PLACE ONE TABLET UNDER THE TONGUE EVERY 5 MINUTES AS NEEDED.   pantoprazole (PROTONIX) 40 MG tablet Take 1 tablet (40 mg total) by mouth 2 (two) times daily.   Polyethyl Glycol-Propyl Glycol (SYSTANE OP) Place 1 drop into both eyes daily as needed (dry eyes).   rOPINIRole (REQUIP) 3 MG tablet Take 3 mg by mouth at bedtime as needed (rls).   vitamin B-12 (CYANOCOBALAMIN) 500 MCG tablet Take 500 mcg by mouth daily.   No facility-administered encounter medications on file as of 12/06/2020.   REVIEW OF SYSTEMS:  Gen: Denies fever, sweats or chills. No weight loss.  CV: Denies chest pain, palpitations or edema. Resp: + DOE. No cough.  GI: See HPI.   GU : Denies urinary burning, blood in urine, increased urinary frequency or incontinence. MS: + Arthritis.  Derm: Denies rash, itchiness, skin lesions or unhealing ulcers. Psych: + Anxiety and depression. No signficant memory loss. Heme: Denies bruising, bleeding. Neuro:  Denies headaches, dizziness or paresthesias. Endo:  Denies any problems with DM, thyroid or adrenal function.  PHYSICAL EXAM: BP 130/76   Pulse 60   Ht '5\' 7"'$  (1.702 m)   Wt 176 lb 9.6 oz (80.1 kg)   BMI 27.66 kg/m   General: 80 year old female in no acute distress. Head: Normocephalic and atraumatic. Eyes:  Sclerae non-icteric, conjunctive pink. Ears: Normal auditory acuity. Mouth: Dentition intact. No ulcers or lesions.  Neck: Supple, no lymphadenopathy or thyromegaly.  Lungs: Clear bilaterally to auscultation without wheezes, crackles or rhonchi. Heart: Regular rate and rhythm. No murmur, rub or gallop appreciated.  Abdomen: Soft, nontender, non distended. No masses. No hepatosplenomegaly. Normoactive bowel sounds x 4 quadrants. Evidence of past TRAM flap with upper  abdominal bulge.  Rectal: Deferred. Musculoskeletal: Symmetrical with no gross deformities. Skin: Warm and dry. No rash or lesions on visible extremities. Extremities: No edema. Neurological: Alert oriented x 4,  no focal deficits.  Psychological:  Alert and cooperative. Normal mood and affect.  ASSESSMENT AND PLAN:  80 year old female admitted to the hospital 10/29/2020 with UGI bleed and anemia. Admission Hg 7.8. Transfused 1 unit of PRBCs -> Hg 9.3. EGD 10/30/2020 identified a large hiatal hernia with gastric Cameron erosions without active bleeding of stigmata of recent bleeding and a single nonbleeding duodenal AVM treated with APC. She had 2 episodes of epigastric pain and coffee colored watery diarrhea post hospital discharge. Persistent fatigue.  -CBC, iron, iron saturation, TIBC, ferritin and BMP -I will consult with Dr. Ardis Hughs to verify if she requires a repeat EGD to assess for [facility] erosion healing vs enteroscopy  to assess for further small bowel AVMs as she had 2 episodes of upper abdominal pain and coffee colored stools since her hospital discharge back on Plavix. If her anemia worsens should she also have a colonoscopy at the age of 6 as her last colonoscopy in 2011 was negative but the bowel prep was poor?  -Continue Ferrous Sulfate 325 mg 1 p.o. twice daily -Continue Pantoprazole 40 mg p.o. twice daily  History of a CVA on Plavix   History of breast cancer     CC:  Dorothyann Peng, NP

## 2020-12-06 ENCOUNTER — Ambulatory Visit (INDEPENDENT_AMBULATORY_CARE_PROVIDER_SITE_OTHER): Payer: Medicare Other | Admitting: Nurse Practitioner

## 2020-12-06 ENCOUNTER — Other Ambulatory Visit (INDEPENDENT_AMBULATORY_CARE_PROVIDER_SITE_OTHER): Payer: Medicare Other

## 2020-12-06 ENCOUNTER — Encounter: Payer: Self-pay | Admitting: Nurse Practitioner

## 2020-12-06 VITALS — BP 130/76 | HR 60 | Ht 67.0 in | Wt 176.6 lb

## 2020-12-06 DIAGNOSIS — K259 Gastric ulcer, unspecified as acute or chronic, without hemorrhage or perforation: Secondary | ICD-10-CM

## 2020-12-06 DIAGNOSIS — D500 Iron deficiency anemia secondary to blood loss (chronic): Secondary | ICD-10-CM

## 2020-12-06 LAB — BASIC METABOLIC PANEL
BUN: 26 mg/dL — ABNORMAL HIGH (ref 6–23)
CO2: 26 mEq/L (ref 19–32)
Calcium: 9.7 mg/dL (ref 8.4–10.5)
Chloride: 103 mEq/L (ref 96–112)
Creatinine, Ser: 0.93 mg/dL (ref 0.40–1.20)
GFR: 58.9 mL/min — ABNORMAL LOW (ref >60.00)
Glucose, Bld: 93 mg/dL (ref 70–99)
Potassium: 4.6 mEq/L (ref 3.5–5.1)
Sodium: 138 mEq/L (ref 135–145)

## 2020-12-06 LAB — CBC
HCT: 38.1 % (ref 36.0–46.0)
Hemoglobin: 12.1 g/dL (ref 12.0–15.0)
MCHC: 31.8 g/dL (ref 30.0–36.0)
MCV: 79.8 fl (ref 78.0–100.0)
Platelets: 242 10*3/uL (ref 150.0–400.0)
RBC: 4.78 Mil/uL (ref 3.87–5.11)
RDW: 27.3 % — ABNORMAL HIGH (ref 11.5–15.5)
WBC: 5.9 10*3/uL (ref 4.0–10.5)

## 2020-12-06 NOTE — Patient Instructions (Addendum)
If you are age 78 or older, your body mass index should be between 23-30. Your Body mass index is 27.66 kg/m. If this is out of the aforementioned range listed, please consider follow up with your Primary Care Provider.  The [facility] providers would like to encourage you to use [facility] to communicate with providers for non-urgent requests or questions.  Due to long hold times on the telephone, sending your provider a message by [facility] may be faster and more efficient way to get a response. Please allow 48 business hours for a response.  Please remember that this is for non-urgent requests/questions.  LABS:  Lab work has been ordered for you today. Our lab is located in the basement. Press "B" on the elevator. The lab is located at the first door on the left as you exit the elevator.  HEALTHCARE LAWS AND MY CHART RESULTS: Due to recent changes in healthcare laws, you may see the results of your imaging and laboratory studies on MyChart before your provider has had a chance to review them.   We understand that in some cases there may be results that are confusing or concerning to you. Not all laboratory results come back in the same time frame and the provider may be waiting for multiple results in order to interpret others.  Please give [place] 48 hours in order for your provider to thoroughly review all the results before contacting the office for clarification of your results.   Further recommendations to be determined after lab results have been reviewed. Continue Pantoprazole 40 MG twice a day.  Please call our office if abdominal pain or dark color stools recur. It was great seeing you today! Thank you for entrusting me with your care and choosing [facility].  Noralyn Pick, CRNP

## 2020-12-07 LAB — IRON,TIBC AND FERRITIN PANEL
%SAT: 9 % (calc) — ABNORMAL LOW (ref 16–45)
Ferritin: 20 ng/mL (ref 16–288)
Iron: 38 ug/dL — ABNORMAL LOW (ref 45–160)
TIBC: 403 mcg/dL (calc) (ref 250–450)

## 2020-12-07 NOTE — Progress Notes (Signed)
I agree with the above note, plan.  Hemoglobin recheck yesterday looks great.  I will think she needs further work-up.  Probably would be a good idea to repeat her CBC in 3 months while she stays on iron every single day.  She should plan to remain on iron for as long as she requires Plavix which is undoubtedly adding to her intermittent low-volume GI bleeding.

## 2020-12-09 ENCOUNTER — Other Ambulatory Visit: Payer: Self-pay | Admitting: *Deleted

## 2020-12-09 DIAGNOSIS — D500 Iron deficiency anemia secondary to blood loss (chronic): Secondary | ICD-10-CM

## 2020-12-16 DIAGNOSIS — H43813 Vitreous degeneration, bilateral: Secondary | ICD-10-CM | POA: Diagnosis not present

## 2020-12-16 DIAGNOSIS — Z961 Presence of intraocular lens: Secondary | ICD-10-CM | POA: Diagnosis not present

## 2020-12-16 DIAGNOSIS — H353112 Nonexudative age-related macular degeneration, right eye, intermediate dry stage: Secondary | ICD-10-CM | POA: Diagnosis not present

## 2020-12-16 DIAGNOSIS — H353221 Exudative age-related macular degeneration, left eye, with active choroidal neovascularization: Secondary | ICD-10-CM | POA: Diagnosis not present

## 2021-01-17 ENCOUNTER — Ambulatory Visit (INDEPENDENT_AMBULATORY_CARE_PROVIDER_SITE_OTHER): Payer: Medicare Other

## 2021-01-17 ENCOUNTER — Other Ambulatory Visit: Payer: Self-pay

## 2021-01-17 VITALS — BP 128/72 | HR 82 | Temp 98.5°F | Ht 67.0 in | Wt 177.0 lb

## 2021-01-17 DIAGNOSIS — Z0000 Encounter for general adult medical examination without abnormal findings: Secondary | ICD-10-CM | POA: Diagnosis not present

## 2021-01-17 NOTE — Patient Instructions (Signed)
Shelley Coleman , Thank you for taking time to come for your Medicare Wellness Visit. I appreciate your ongoing commitment to your health goals. Please review the following plan we discussed and let me know if I can assist you in the future.   Screening recommendations/referrals: Colonoscopy: no longer required  Mammogram: no longer required  Bone Density: 02/27/2020 Recommended yearly ophthalmology/optometry visit for glaucoma screening and checkup Recommended yearly dental visit for hygiene and checkup  Vaccinations: Influenza vaccine: due fall 2022  Pneumococcal vaccine: completed series  Tdap vaccine: allergy  Shingles vaccine: will consider     Advanced directives: will provide copies   Conditions/risks identified: none   Next appointment: none    Preventive Care 67 Years and Older, Female Preventive care refers to lifestyle choices and visits with your health care provider that can promote health and wellness. What does preventive care include? A yearly physical exam. This is also called an annual well check. Dental exams once or twice a year. Routine eye exams. Ask your health care provider how often you should have your eyes checked. Personal lifestyle choices, including: Daily care of your teeth and gums. Regular physical activity. Eating a healthy diet. Avoiding tobacco and drug use. Limiting alcohol use. Practicing safe sex. Taking low-dose aspirin every day. Taking vitamin and mineral supplements as recommended by your health care provider. What happens during an annual well check? The services and screenings done by your health care provider during your annual well check will depend on your age, overall health, lifestyle risk factors, and family history of disease. Counseling  Your health care provider may ask you questions about your: Alcohol use. Tobacco use. Drug use. Emotional well-being. Home and relationship well-being. Sexual activity. Eating  habits. History of falls. Memory and ability to understand (cognition). Work and work Statistician. Reproductive health. Screening  You may have the following tests or measurements: Height, weight, and BMI. Blood pressure. Lipid and cholesterol levels. These may be checked every 5 years, or more frequently if you are over 63 years old. Skin check. Lung cancer screening. You may have this screening every year starting at age 100 if you have a 30-pack-year history of smoking and currently smoke or have quit within the past 15 years. Fecal occult blood test (FOBT) of the stool. You may have this test every year starting at age 34. Flexible sigmoidoscopy or colonoscopy. You may have a sigmoidoscopy every 5 years or a colonoscopy every 10 years starting at age 19. Hepatitis C blood test. Hepatitis B blood test. Sexually transmitted disease (STD) testing. Diabetes screening. This is done by checking your blood sugar (glucose) after you have not eaten for a while (fasting). You may have this done every 1-3 years. Bone density scan. This is done to screen for osteoporosis. You may have this done starting at age 1. Mammogram. This may be done every 1-2 years. Talk to your health care provider about how often you should have regular mammograms. Talk with your health care provider about your test results, treatment options, and if necessary, the need for more tests. Vaccines  Your health care provider may recommend certain vaccines, such as: Influenza vaccine. This is recommended every year. Tetanus, diphtheria, and acellular pertussis (Tdap, Td) vaccine. You may need a Td booster every 10 years. Zoster vaccine. You may need this after age 23. Pneumococcal 13-valent conjugate (PCV13) vaccine. One dose is recommended after age 104. Pneumococcal polysaccharide (PPSV23) vaccine. One dose is recommended after age 22. Talk to your health  care provider about which screenings and vaccines you need and how  often you need them. This information is not intended to replace advice given to you by your health care provider. Make sure you discuss any questions you have with your health care provider. Document Released: 05/14/2015 Document Revised: 01/05/2016 Document Reviewed: 02/16/2015 Elsevier Interactive Patient Education  2017 [place] Prevention in the Home Falls can cause injuries. They can happen to people of all ages. There are many things you can do to make your home safe and to help prevent falls. What can I do on the outside of my home? Regularly fix the edges of walkways and driveways and fix any cracks. Remove anything that might make you trip as you walk through a door, such as a raised step or threshold. Trim any bushes or trees on the path to your home. Use bright outdoor lighting. Clear any walking paths of anything that might make someone trip, such as rocks or tools. Regularly check to see if handrails are loose or broken. Make sure that both sides of any steps have handrails. Any raised decks and porches should have guardrails on the edges. Have any leaves, snow, or ice cleared regularly. Use sand or salt on walking paths during winter. Clean up any spills in your garage right away. This includes oil or grease spills. What can I do in the bathroom? Use night lights. Install grab bars by the toilet and in the tub and shower. Do not use towel bars as grab bars. Use non-skid mats or decals in the tub or shower. If you need to sit down in the shower, use a plastic, non-slip stool. Keep the floor dry. Clean up any water that spills on the floor as soon as it happens. Remove soap buildup in the tub or shower regularly. Attach bath mats securely with double-sided non-slip rug tape. Do not have throw rugs and other things on the floor that can make you trip. What can I do in the bedroom? Use night lights. Make sure that you have a light by your bed that is easy to  reach. Do not use any sheets or blankets that are too big for your bed. They should not hang down onto the floor. Have a firm chair that has side arms. You can use this for support while you get dressed. Do not have throw rugs and other things on the floor that can make you trip. What can I do in the kitchen? Clean up any spills right away. Avoid walking on wet floors. Keep items that you use a lot in easy-to-reach places. If you need to reach something above you, use a strong step stool that has a grab bar. Keep electrical cords out of the way. Do not use floor polish or wax that makes floors slippery. If you must use wax, use non-skid floor wax. Do not have throw rugs and other things on the floor that can make you trip. What can I do with my stairs? Do not leave any items on the stairs. Make sure that there are handrails on both sides of the stairs and use them. Fix handrails that are broken or loose. Make sure that handrails are as long as the stairways. Check any carpeting to make sure that it is firmly attached to the stairs. Fix any carpet that is loose or worn. Avoid having throw rugs at the top or bottom of the stairs. If you do have throw rugs, attach them to  the floor with carpet tape. Make sure that you have a light switch at the top of the stairs and the bottom of the stairs. If you do not have them, ask someone to add them for you. What else can I do to help prevent falls? Wear shoes that: Do not have high heels. Have rubber bottoms. Are comfortable and fit you well. Are closed at the toe. Do not wear sandals. If you use a stepladder: Make sure that it is fully opened. Do not climb a closed stepladder. Make sure that both sides of the stepladder are locked into place. Ask someone to hold it for you, if possible. Clearly mark and make sure that you can see: Any grab bars or handrails. First and last steps. Where the edge of each step is. Use tools that help you move  around (mobility aids) if they are needed. These include: Canes. Walkers. Scooters. Crutches. Turn on the lights when you go into a dark area. Replace any light bulbs as soon as they burn out. Set up your furniture so you have a clear path. Avoid moving your furniture around. If any of your floors are uneven, fix them. If there are any pets around you, be aware of where they are. Review your medicines with your doctor. Some medicines can make you feel dizzy. This can increase your chance of falling. Ask your doctor what other things that you can do to help prevent falls. This information is not intended to replace advice given to you by your health care provider. Make sure you discuss any questions you have with your health care provider. Document Released: 02/11/2009 Document Revised: 09/23/2015 Document Reviewed: 05/22/2014 Elsevier Interactive Patient Education  2017 [employer].

## 2021-01-17 NOTE — Progress Notes (Signed)
Subjective:   Shelley Coleman is a 80 y.o. female who presents for Medicare Annual (Subsequent) preventive examination.  Review of Systems    N/a       Objective:    There were no vitals filed for this visit. There is no height or weight on file to calculate BMI.  Advanced Directives 10/28/2020 11/27/2019 11/15/2016 04/27/2016 04/27/2016 11/02/2015 11/04/2014  Does Patient Have a Medical Advance Directive? No No;Yes No;Yes Yes Yes Yes Yes  Type of Advance Directive - [place];Living will [place];Living will [place];Living will  Does patient want to make changes to medical advance directive? - No - Patient declined No - Patient declined - - No - Patient declined -  Copy of [place] in Chart? - No - copy requested No - copy requested - - No - copy requested No - copy requested  Would patient like information on creating a medical advance directive? No - Patient declined - No - Patient declined - - - -    Current Medications (verified) Outpatient Encounter Medications as of 01/17/2021  Medication Sig   acetaminophen (TYLENOL) 500 MG tablet Take 1,000 mg by mouth every 6 (six) hours as needed for moderate pain or headache.   ALPRAZolam (XANAX) 1 MG tablet TAKE 1 TABLET BY MOUTH AT BEDTIME AS NEEDED   atenolol (TENORMIN) 25 MG tablet Take 0.5 tablets (12.5 mg total) by mouth daily.   clopidogrel (PLAVIX) 75 MG tablet Take 1 tablet (75 mg total) by mouth daily.   ferrous sulfate 325 (65 FE) MG tablet Take 1 tablet (325 mg total) by mouth 2 (two) times daily with a meal.   Multiple Vitamins-Minerals (CENTRUM SILVER 50+WOMEN) TABS Take 1 tablet by mouth daily.   Multiple Vitamins-Minerals (PRESERVISION AREDS 2 PO) Take 1 tablet by mouth in the morning and at bedtime.   nitroGLYCERIN (NITROSTAT) 0.4 MG SL tablet PLACE ONE TABLET UNDER THE TONGUE EVERY 5 MINUTES AS NEEDED.   pantoprazole  (PROTONIX) 40 MG tablet Take 1 tablet (40 mg total) by mouth 2 (two) times daily.   Polyethyl Glycol-Propyl Glycol (SYSTANE OP) Place 1 drop into both eyes daily as needed (dry eyes).   rOPINIRole (REQUIP) 3 MG tablet Take 3 mg by mouth at bedtime as needed (rls).   vitamin B-12 (CYANOCOBALAMIN) 500 MCG tablet Take 500 mcg by mouth daily.   No facility-administered encounter medications on file as of 01/17/2021.    Allergies (verified) Diflunisal, Lipitor [atorvastatin], Statins, and Tetanus toxoid   History: Past Medical History:  Diagnosis Date   Anemia    Has had iron infusions   Breast cancer ([place])    right   Cerebrovascular disease 11/12/2018   Depression    Depression    GERD (gastroesophageal reflux disease)    Hiatal hernia    History of GI bleed    Hx of mitral valve prolapse    Hyperlipidemia    Irregular heart beat    Neuropathy    numbness in both feet   Nocturnal leg cramps 09/02/2015   Optic neuritis    left   Osteopenia    Ovarian cyst    Parotid tumor    right   Pelvic fracture (HCC)    Restless legs syndrome    Shingles 2000   Left in V1   Stroke [facility])    Past Surgical History:  Procedure Laterality Date   ABDOMINAL HYSTERECTOMY  APPENDECTOMY     BREAST LUMPECTOMY Left    CATARACT EXTRACTION     ESOPHAGEAL MANOMETRY N/A 01/13/2013   Procedure: ESOPHAGEAL MANOMETRY (EM);  Surgeon: Sable Feil, MD;  Location: [facility];  Service: Endoscopy;  Laterality: N/A;   ESOPHAGOGASTRODUODENOSCOPY (EGD) WITH PROPOFOL N/A 10/30/2020   Procedure: ESOPHAGOGASTRODUODENOSCOPY (EGD) WITH PROPOFOL;  Surgeon: Mauri Pole, MD;  Location: [facility];  Service: Endoscopy;  Laterality: N/A;   FOREIGN BODY REMOVAL  10/30/2020   Procedure: FOREIGN BODY REMOVAL;  Surgeon: Mauri Pole, MD;  Location: [place];  Service: Endoscopy;;   HERNIA REPAIR  08/2009   HOT HEMOSTASIS N/A 10/30/2020   Procedure: HOT HEMOSTASIS (ARGON PLASMA COAGULATION/BICAP);   Surgeon: Mauri Pole, MD;  Location[facility] END[facility]COPY;  Service: Endoscopy;  Laterality: N/A;   MASTECTOMY Right    SPINE SURGERY     TOTAL HIP ARTHROPLASTY Left    Family History  Problem Relation Age of Onset   Cancer Mother        Angio sarcoma    Heart disease Father    Hypertension Father    Stroke Father        x2   Heart attack Father        x 11   Hernia Brother    Hypertension Brother    Social History   Socioeconomic History   Marital status: Married    Spouse name: Not on file   Number of children: 2   Years of education: 16   Highest education level: Not on file  Occupational History   Occupation: Retired Therapist, sports  Tobacco Use   Smoking status: Never   Smokeless tobacco: Never  Vaping Use   Vaping Use: Never used  Substance and Sexual Activity   Alcohol use: Yes    Alcohol/week: 1.0 standard drink    Types: 1 Glasses of wine per week    Comment: Rarely,socially   Drug use: No   Sexual activity: Not on file  Other Topics Concern   Not on file  Social History Narrative   Retired from nursing    Widowed    Left-handed      Social Determinants of Health   Financial Resource Strain: Not on file  Food Insecurity: Not on file  Transportation Needs: Not on file  Physical Activity: Not on file  Stress: Not on file  Social Connections: Not on file    Tobacco Counseling Counseling given: Not Answered   Clinical Intake:                 Diabetic?no         Activities of Daily Living In your present state of health, do you have any difficulty performing the following activities: 10/31/2020  Hearing? Y  Vision? N  Difficulty concentrating or making decisions? N  Walking or climbing stairs? Y  Dressing or bathing? N  Doing errands, shopping? N  Some recent data might be hidden    Patient Care Team: Dorothyann Peng, NP as PCP - General (Family Medicine) Kathie Rhodes, MD (Inactive) as Consulting Physician (Urology)  Indicate any  recent Medical Services you may have received from other than [facility] providers in the past year (date may be approximate).     Assessment:   This is a routine wellness examination for [name].  Hearing/Vision screen No results found.  Dietary issues and exercise activities discussed:     Goals Addressed   None    Depression Screen PHQ 2/9 Scores 11/27/2019  02/05/2017 04/27/2016 02/17/2015 02/04/2015 09/14/2014 06/03/2014  PHQ - 2 Score 1 2 1 4 6 2 1   PHQ- 9 Score 2 6 4 14 18 13  -    Fall Risk Fall Risk  11/27/2019 03/21/2018 02/05/2017 09/13/2016 04/27/2016  Falls in the past year? 0 1 Yes No Yes  Comment - [facility]i Telephone Survey: data to providers prior to load - - -  Number falls in past yr: 0 1 2 or more - -  Comment - Emmi Telephone Survey Actual Response = 2 - - -  Injury with Fall? 0 1 Yes - -  Comment - - - - -  Risk Factor Category  - - High Fall Risk - -  Risk for fall due to : Medication side effect - Impaired balance/gait;Impaired mobility - -  Risk for fall due to: Comment - - - - -  Follow up Falls evaluation completed;Falls prevention discussed - Falls evaluation completed;Education provided;Falls prevention discussed - -    FALL RISK PREVENTION PERTAINING TO THE HOME:  Any stairs in or around the home? Yes  If so, are there any without handrails? No  Home free of loose throw rugs in walkways, pet beds, electrical cords, etc? Yes  Adequate lighting in your home to reduce risk of falls? Yes   ASSISTIVE DEVICES UTILIZED TO PREVENT FALLS:  Life alert? No  Use of a cane, walker or w/c? No  Grab bars in the bathroom? No  Shower chair or bench in shower? No  Elevated toilet seat or a handicapped toilet? No   TIMED UP AND GO:  Was the test performed? Yes .  Length of time to ambulate 10 feet: 10 sec.   Gait steady and fast without use of assistive device  Cognitive Function: Normal cognitive status assessed by direct observation by this Nurse Health Advisor. No  abnormalities found.   MMSE - Mini Mental State Exam 04/27/2016 04/27/2016 09/02/2015  Not completed: (No Data) (No Data) -  Orientation to time - - 5  Orientation to Place - - 5  Registration - - 3  Attention/ Calculation - - 5  Recall - - 2  Language- name 2 objects - - 2  Language- repeat - - 1  Language- follow 3 step command - - 3  Language- read & follow direction - - 1  Write a sentence - - 1  Copy design - - 1  Total score - - 29     6CIT Screen 11/27/2019  What Year? 0 points  What month? 0 points  What time? 0 points  Count back from 20 0 points  Months in reverse 0 points  Repeat phrase 2 points  Total Score 2    Immunizations Immunization History  Administered Date(s) Administered   Influenza Split 01/11/2012   Influenza Whole 01/22/2009   Influenza, High Dose Seasonal PF 04/06/2014, 01/25/2015, 02/22/2016, 02/04/2017, 01/14/2019, 03/05/2020   Influenza,inj,Quad PF,6+ Mos 01/22/2013   Influenza,trivalent, recombinat, inj, PF 03/31/2011   Influenza-Unspecified 03/01/2018   PFIZER(Purple Top)SARS-COV-2 Vaccination 06/05/2019, 07/01/2019, 03/05/2020   Pneumococcal Conjugate-13 01/25/2015   Pneumococcal Polysaccharide-23 09/16/2007   Pneumococcal-Unspecified 01/14/2019   Td 05/01/2006   Zoster, Live 09/16/2007    TDAP status: Due, Education has been provided regarding the importance of this vaccine. Advised may receive this vaccine at local pharmacy or Health Dept. Aware to provide a copy of the vaccination record if obtained from local pharmacy or Health Dept. Verbalized acceptance and understanding.  Flu Vaccine status: Up to date  Pneumococcal vaccine status: Up to date  Covid-19 vaccine status: Completed vaccines  Qualifies for Shingles Vaccine? Yes   Zostavax completed No   Shingrix Completed?: No.    Education has been provided regarding the importance of this vaccine. Patient has been advised to call insurance company to determine out of pocket  expense if they have not yet received this vaccine. Advised may also receive vaccine at local pharmacy or Health Dept. Verbalized acceptance and understanding.  Screening Tests Health Maintenance  Topic Date Due   Hepatitis C Screening  Never done   Zoster Vaccines- Shingrix (1 of 2) Never done   TETANUS/TDAP  05/01/2016   COVID-19 Vaccine (4 - Booster for [employer] series) 05/28/2020   INFLUENZA VACCINE  11/29/2020   DEXA SCAN  Completed   HPV VACCINES  Aged Out    Health Maintenance  Health Maintenance Due  Topic Date Due   Hepatitis C Screening  Never done   Zoster Vaccines- Shingrix (1 of 2) Never done   TETANUS/TDAP  05/01/2016   COVID-19 Vaccine (4 - Booster for [employer] series) 05/28/2020   INFLUENZA VACCINE  11/29/2020    Colorectal cancer screening: No longer required.   Mammogram status: No longer required due to age.  Bone Density status: Completed 02/27/2020. Results reflect: Bone density results: OSTEOPENIA. Repeat every 02/27/2020 years.  Lung Cancer Screening: (Low Dose CT Chest recommended if Age 26-80 years, 30 pack-year currently smoking OR have quit w/in 15years.) does not qualify.   Lung Cancer Screening Referral: n/a  Additional Screening:  Hepatitis C Screening: does not qualify;   Vision Screening: Recommended annual ophthalmology exams for early detection of glaucoma and other disorders of the eye. Is the patient up to date with their annual eye exam?  Yes  Who is the provider or what is the name of the office in which the patient attends annual eye exams? Dr.Stoneciffer If pt is not established with a provider, would they like to be referred to a provider to establish care? No .   Dental Screening: Recommended annual dental exams for proper oral hygiene  Community Resource Referral / Chronic Care Management: CRR required this visit?  No   CCM required this visit?  No      Plan:     I have personally reviewed and noted the following in the  patient's chart:   Medical and social history Use of alcohol, tobacco or illicit drugs  Current medications and supplements including opioid prescriptions.  Functional ability and status Nutritional status Physical activity Advanced directives List of other physicians Hospitalizations, surgeries, and ER visits in previous 12 months Vitals Screenings to include cognitive, depression, and falls Referrals and appointments  In addition, I have reviewed and discussed with patient certain preventive protocols, quality metrics, and best practice recommendations. A written personalized care plan for preventive services as well as general preventive health recommendations were provided to patient.     Randel Pigg, LPN   [id number]   Nurse Notes: none

## 2021-02-17 DIAGNOSIS — H353221 Exudative age-related macular degeneration, left eye, with active choroidal neovascularization: Secondary | ICD-10-CM | POA: Diagnosis not present

## 2021-02-17 DIAGNOSIS — Z961 Presence of intraocular lens: Secondary | ICD-10-CM | POA: Diagnosis not present

## 2021-02-17 DIAGNOSIS — H353112 Nonexudative age-related macular degeneration, right eye, intermediate dry stage: Secondary | ICD-10-CM | POA: Diagnosis not present

## 2021-02-25 DIAGNOSIS — Z23 Encounter for immunization: Secondary | ICD-10-CM | POA: Diagnosis not present

## 2021-03-14 ENCOUNTER — Other Ambulatory Visit (INDEPENDENT_AMBULATORY_CARE_PROVIDER_SITE_OTHER): Payer: Medicare Other

## 2021-03-14 DIAGNOSIS — D500 Iron deficiency anemia secondary to blood loss (chronic): Secondary | ICD-10-CM

## 2021-03-14 LAB — CBC WITH DIFFERENTIAL/PLATELET
Basophils Absolute: 0.1 10*3/uL (ref 0.0–0.1)
Basophils Relative: 1.2 % (ref 0.0–3.0)
Eosinophils Absolute: 0.1 10*3/uL (ref 0.0–0.7)
Eosinophils Relative: 2.2 % (ref 0.0–5.0)
HCT: 42.1 % (ref 36.0–46.0)
Hemoglobin: 13.8 g/dL (ref 12.0–15.0)
Lymphocytes Relative: 36.7 % (ref 12.0–46.0)
Lymphs Abs: 2 10*3/uL (ref 0.7–4.0)
MCHC: 32.9 g/dL (ref 30.0–36.0)
MCV: 87.8 fl (ref 78.0–100.0)
Monocytes Absolute: 0.4 10*3/uL (ref 0.1–1.0)
Monocytes Relative: 7.3 % (ref 3.0–12.0)
Neutro Abs: 2.8 10*3/uL (ref 1.4–7.7)
Neutrophils Relative %: 52.6 % (ref 43.0–77.0)
Platelets: 220 10*3/uL (ref 150.0–400.0)
RBC: 4.79 Mil/uL (ref 3.87–5.11)
RDW: 14.9 % (ref 11.5–15.5)
WBC: 5.3 10*3/uL (ref 4.0–10.5)

## 2021-03-14 LAB — IBC + FERRITIN
Ferritin: 15.1 ng/mL (ref 10.0–291.0)
Iron: 73 ug/dL (ref 42–145)
Saturation Ratios: 16.8 % — ABNORMAL LOW (ref 20.0–50.0)
TIBC: 434 ug/dL (ref 250.0–450.0)
Transferrin: 310 mg/dL (ref 212.0–360.0)

## 2021-03-15 LAB — IRON AND TIBC
Iron Saturation: 20 % (ref 15–55)
Iron: 72 ug/dL (ref 27–139)
Total Iron Binding Capacity: 358 ug/dL (ref 250–450)
UIBC: 286 ug/dL (ref 118–369)

## 2021-03-18 ENCOUNTER — Other Ambulatory Visit: Payer: Self-pay

## 2021-03-18 DIAGNOSIS — D500 Iron deficiency anemia secondary to blood loss (chronic): Secondary | ICD-10-CM

## 2021-03-25 ENCOUNTER — Other Ambulatory Visit: Payer: Self-pay | Admitting: Adult Health

## 2021-03-31 ENCOUNTER — Other Ambulatory Visit: Payer: Self-pay | Admitting: Adult Health

## 2021-03-31 MED ORDER — ATENOLOL 25 MG PO TABS: 12.5000 mg | ORAL_TABLET | Freq: Every day | ORAL | 0 refills | Status: DC

## 2021-04-18 ENCOUNTER — Encounter: Payer: Self-pay | Admitting: Adult Health

## 2021-04-18 ENCOUNTER — Ambulatory Visit (INDEPENDENT_AMBULATORY_CARE_PROVIDER_SITE_OTHER): Payer: Medicare Other | Admitting: Adult Health

## 2021-04-18 VITALS — BP 136/83 | HR 68 | Ht 67.0 in | Wt 178.6 lb

## 2021-04-18 DIAGNOSIS — G609 Hereditary and idiopathic neuropathy, unspecified: Secondary | ICD-10-CM | POA: Diagnosis not present

## 2021-04-18 DIAGNOSIS — G2581 Restless legs syndrome: Secondary | ICD-10-CM

## 2021-04-18 DIAGNOSIS — G8929 Other chronic pain: Secondary | ICD-10-CM | POA: Diagnosis not present

## 2021-04-18 DIAGNOSIS — M5441 Lumbago with sciatica, right side: Secondary | ICD-10-CM

## 2021-04-18 NOTE — Progress Notes (Signed)
PATIENT: Shelley Coleman DOB: 1940/09/09  REASON FOR VISIT: follow up HISTORY FROM: patient PRIMARY NEUROLOGIST:   HISTORY OF PRESENT ILLNESS: Today 04/18/21:  Shelley Coleman is a 80 year old female with a history of peripheral neuropathy, restless leg syndrome and back pain.  She returns today for follow-up.  She feels that her symptoms have gotten worse.  She had back surgery in 2014 and states over time she feels that her back pain has gotten worse.  States that it is in the lower back and bilaterally.  Occasionally she will have pain that goes down the right leg.  She feels that the numbness burning and tingling in both feet have gotten worse.  She states that she used a hair dryer to dry her toenails but could not feel the heat.  She notices that her balance is off but has not had any falls.  She has a hard time sleeping at night due to restless legs.  Sometimes she will lay awake 1 to 2 hours before she is able to go to sleep.  She states that taking Requip nightly makes her drowsy the next morning.  According to the patient she has tried taking Requip several different ways such as earlier in the afternoon.  She states that she had spinal injections prior to 2014-she noticed that she had limited movement in her right toes after injections.  States that she is unable to turn or curl her toes.  HISTORY Shelley Coleman is a 80 year old right-handed white female with a history of a peripheral neuropathy and some low back pain.  The patient also suffers from restless leg syndrome.  She has had a sensitive stomach, she has gone off many of her medications because of this.  She takes 1 to 2 mg of Requip in the evening, if she takes more than this she has a hangover feeling the next morning.  She is not sleeping well, she has fatigue during the day.  She has started vitamin B12.  She has burning in the feet at night.  In the past, she got no benefit from gabapentin, Lyrica, or Cymbalta.  She takes melatonin at  night.  She tries to walk 1 to 1/2 miles daily, she does have some shortness of breath and she goes up hills.  She returns to this office for an evaluation   REVIEW OF SYSTEMS: Out of a complete 14 system review of symptoms, the patient complains only of the following symptoms, and all other reviewed systems are negative.  ALLERGIES: Allergies  Allergen Reactions   Diflunisal     REACTION: rash , swelling  anaphlaxsis   Lipitor [Atorvastatin]     Pt had body joint pain   Statins     Myalgia     Tetanus Toxoid     REACTION: arm swelling, fever    HOME MEDICATIONS: Outpatient Medications Prior to Visit  Medication Sig Dispense Refill   acetaminophen (TYLENOL) 500 MG tablet Take 1,000 mg by mouth every 6 (six) hours as needed for moderate pain or headache.     ALPRAZolam (XANAX) 1 MG tablet TAKE 1 TABLET BY MOUTH AT BEDTIME AS NEEDED 30 tablet 2   atenolol (TENORMIN) 25 MG tablet Take 0.5 tablets (12.5 mg total) by mouth daily. 45 tablet 0   clopidogrel (PLAVIX) 75 MG tablet Take 1 tablet (75 mg total) by mouth daily.     ferrous sulfate 325 (65 FE) MG tablet Take 1 tablet (325 mg total) by mouth 2 (  two) times daily with a meal. 60 tablet 0   Multiple Vitamins-Minerals (CENTRUM SILVER 50+WOMEN) TABS Take 1 tablet by mouth daily.     Multiple Vitamins-Minerals (PRESERVISION AREDS 2 PO) Take 1 tablet by mouth in the morning and at bedtime.     nitroGLYCERIN (NITROSTAT) 0.4 MG SL tablet PLACE ONE TABLET UNDER THE TONGUE EVERY 5 MINUTES AS NEEDED. 100 tablet 0   pantoprazole (PROTONIX) 40 MG tablet Take 1 tablet (40 mg total) by mouth 2 (two) times daily. 180 tablet 0   Polyethyl Glycol-Propyl Glycol (SYSTANE OP) Place 1 drop into both eyes daily as needed (dry eyes).     rOPINIRole (REQUIP) 3 MG tablet Take 3 mg by mouth at bedtime as needed (rls).     vitamin B-12 (CYANOCOBALAMIN) 500 MCG tablet Take 500 mcg by mouth daily.     No facility-administered medications prior to visit.     PAST MEDICAL HISTORY: Past Medical History:  Diagnosis Date   Anemia    Has had iron infusions   Breast cancer ([place])    right   Cerebrovascular disease 11/12/2018   Depression    Depression    GERD (gastroesophageal reflux disease)    Hiatal hernia    History of GI bleed    Hx of mitral valve prolapse    Hyperlipidemia    Irregular heart beat    Neuropathy    numbness in both feet   Nocturnal leg cramps 09/02/2015   Optic neuritis    left   Osteopenia    Ovarian cyst    Parotid tumor    right   Pelvic fracture ([facility])    Restless legs syndrome    Shingles 2000   Left in V1   Stroke ([place])     PAST SURGICAL HISTORY: Past Surgical History:  Procedure Laterality Date   ABDOMINAL HYSTERECTOMY     APPENDECTOMY     BREAST LUMPECTOMY Left    CATARACT EXTRACTION     ESOPHAGEAL MANOMETRY N/A 01/13/2013   Procedure: ESOPHAGEAL MANOMETRY (EM);  Surgeon: Sable Feil, MD;  Location: [facility];  Service: Endoscopy;  Laterality: N/A;   ESOPHAGOGASTRODUODENOSCOPY (EGD) WITH PROPOFOL N/A 10/30/2020   Procedure: ESOPHAGOGASTRODUODENOSCOPY (EGD) WITH PROPOFOL;  Surgeon: Mauri Pole, MD;  Location: [place] ENDOSCOPY;  Service: Endoscopy;  Laterality: N/A;   FOREIGN BODY REMOVAL  10/30/2020   Procedure: FOREIGN BODY REMOVAL;  Surgeon: Mauri Pole, MD;  Location: [place];  Service: Endoscopy;;   HERNIA REPAIR  08/2009   HOT HEMOSTASIS N/A 10/30/2020   Procedure: HOT HEMOSTASIS (ARGON PLASMA COAGULATION/BICAP);  Surgeon: Mauri Pole, MD;  Location[facility]COPY;  Service: Endoscopy;  Laterality: N/A;   MASTECTOMY Right    SPINE SURGERY     TOTAL HIP ARTHROPLASTY Left     FAMILY HISTORY: Family History  Problem Relation Age of Onset   Cancer Mother        Angio sarcoma    Heart disease Father    Hypertension Father    Stroke Father        x2   Heart attack Father        x 11   Hernia Brother    Hypertension Brother    Neuropathy Neg Hx    Restless  legs syndrome Neg Hx     SOCIAL HISTORY: Social History   Socioeconomic History   Marital status: Married    Spouse name: Not on file   Number of children: 2   Years of  education: 16   Highest education level: Not on file  Occupational History   Occupation: Retired Therapist, sports  Tobacco Use   Smoking status: Never   Smokeless tobacco: Never  Vaping Use   Vaping Use: Never used  Substance and Sexual Activity   Alcohol use: Yes    Alcohol/week: 1.0 standard drink    Types: 1 Glasses of wine per week    Comment: Rarely,socially   Drug use: No   Sexual activity: Not on file  Other Topics Concern   Not on file  Social History Narrative   Retired from nursing    Widowed    Left-handed      Social Determinants of Radio broadcast assistant Strain: Low Risk    Difficulty of Paying Living Expenses: Not hard at all  Food Insecurity: No Food Insecurity   Worried About Charity fundraiser in the Last Year: Never true   Arboriculturist in the Last Year: Never true  Transportation Needs: No Transportation Needs   Lack of Transportation (Medical): No   Lack of Transportation (Non-Medical): No  Physical Activity: Inactive   Days of Exercise per Week: 0 days   Minutes of Exercise per Session: 0 min  Stress: No Stress Concern Present   Feeling of Stress : Only a little  Social Connections: Moderately Isolated   Frequency of Communication with Friends and Family: Three times a week   Frequency of Social Gatherings with Friends and Family: Three times a week   Attends Religious Services: More than 4 times per year   Active Member of Clubs or Organizations: No   Attends Archivist Meetings: Never   Marital Status: Widowed  Human resources officer Violence: Not At Risk   Fear of Current or Ex-Partner: No   Emotionally Abused: No   Physically Abused: No   Sexually Abused: No      PHYSICAL EXAM  Vitals:   04/18/21 1451  BP: 136/83  Pulse: 68  Weight: 178 lb 9.6 oz (81 kg)   Height: 5\' 7"  (1.702 m)   Body mass index is 27.97 kg/m.  Generalized: Well developed, in no acute distress   Neurological examination  Mentation: Alert oriented to time, place, history taking. Follows all commands speech and language fluent Cranial nerve II-XII: Pupils were equal round reactive to light. Extraocular movements were full, visual field were full on confrontational test. Facial sensation and strength were normal. Uvula tongue midline. Head turning and shoulder shrug  were normal and symmetric. Motor: The motor testing reveals 5 over 5 strength of all 4 extremities. Good symmetric motor tone is noted throughout.  Sensory: Sensory testing is intact to soft touch on all 4 extremities. No evidence of extinction is noted.  Coordination: Cerebellar testing reveals good finger-nose-finger and heel-to-shin bilaterally.  Gait and station: Patient is able to stand without assistance.  Tandem gait not attempted.  Some difficulty with heel and toe walking.  Romberg is unsteady but negative Reflexes: Deep tendon reflexes are symmetric and normal bilaterally.   DIAGNOSTIC DATA (LABS, IMAGING, TESTING) - I reviewed patient records, labs, notes, testing and imaging myself where available.  Lab Results  Component Value Date   WBC 5.3 03/14/2021   HGB 13.8 03/14/2021   HCT 42.1 03/14/2021   MCV 87.8 03/14/2021   PLT 220.0 03/14/2021      Component Value Date/Time   NA 138 12/06/2020 1211   K 4.6 12/06/2020 1211   CL 103 12/06/2020 1211  CO2 26 12/06/2020 1211   GLUCOSE 93 12/06/2020 1211   BUN 26 (H) 12/06/2020 1211   CREATININE 0.93 12/06/2020 1211   CREATININE 0.77 04/28/2016 1552   CALCIUM 9.7 12/06/2020 1211   PROT 5.5 (L) 10/31/2020 0205   ALBUMIN 3.1 (L) 10/31/2020 0205   AST 19 10/31/2020 0205   ALT 13 10/31/2020 0205   ALKPHOS 62 10/31/2020 0205   BILITOT 0.5 10/31/2020 0205   GFRNONAA >60 10/31/2020 0205   GFRAA >60 11/02/2015 0524   Lab Results  Component  Value Date   CHOL 252 (H) 08/28/2019   HDL 56.60 08/28/2019   LDLCALC 158 (H) 08/28/2019   LDLDIRECT 168.5 02/22/2012   TRIG 186.0 (H) 08/28/2019   CHOLHDL 4 08/28/2019   Lab Results  Component Value Date   HGBA1C 5.3 11/01/2015   Lab Results  Component Value Date   VITAMINB12 439 10/29/2020   Lab Results  Component Value Date   TSH 2.35 08/28/2019      ASSESSMENT AND PLAN 80 y.o. year old female  has a past medical history of Anemia, Breast cancer ([place]), Cerebrovascular disease (11/12/2018), Depression, Depression, GERD (gastroesophageal reflux disease), Hiatal hernia, History of GI bleed, mitral valve prolapse, Hyperlipidemia, Irregular heart beat, Neuropathy, Nocturnal leg cramps (09/02/2015), Optic neuritis, Osteopenia, Ovarian cyst, Parotid tumor, Pelvic fracture ([place]), Restless legs syndrome, Shingles (2000), and Stroke ([place]). here with:  1.  Restless leg syndrome  -Encouraged the patient to try taking Requip early in the afternoon to see if that prevents symptoms at bedtime and her from being drowsy the next day  2.  Low back pain  -MRI of the lumbar spine to look for any changes or nerve root compression that could be contributing to her symptoms  3.  Peripheral neuropathy  -Has tried and failed multiple medications in the past including Cymbalta, Lyrica. - monitor symptoms for now  FU 6 months or sooner if needed.      Ward Givens, MSN, NP-C 04/18/2021, 3:08 PM [facility] [address] [place], [place] [address] [phone]

## 2021-04-18 NOTE — Patient Instructions (Signed)
Your Plan:  MRI of lower back Try taking Requip earlier in the evening If your symptoms worsen or you develop new symptoms please let us know.   Thank you for coming to see [place] at [facility]. I hope we have been able to provide you high quality care today.  You may receive a patient satisfaction survey over the next few weeks. We would appreciate your feedback and comments so that we may continue to improve ourselves and the health of our patients.

## 2021-04-20 ENCOUNTER — Telehealth: Payer: Self-pay | Admitting: Adult Health

## 2021-04-20 NOTE — Chronic Care Management (AMB) (Signed)
°  Chronic Care Management   Note  04/20/2021 Name: TONJIA PARILLO MRN: [id number] DOB: 03/17/1941  KATLYNE NISHIDA is a 80 y.o. year old female who is a primary care patient of Dorothyann Peng, NP. I reached out to Dennison Bulla by phone today in response to a referral sent by Ms. Martin Majestic PCP, Dorothyann Peng, NP.   Ms. Hitz was given information about Chronic Care Management services today including:  CCM service includes personalized support from designated clinical staff supervised by her physician, including individualized plan of care and coordination with other care providers 24/7 contact phone numbers for assistance for urgent and routine care needs. Service will only be billed when office clinical staff spend 20 minutes or more in a month to coordinate care. Only one practitioner may furnish and bill the service in a calendar month. The patient may stop CCM services at any time (effective at the end of the month) by phone call to the office staff.   Patient agreed to services and verbal consent obtained.   Follow up plan: pt aware of deductible   Tatjana Dellinger Upstream Scheduler

## 2021-05-05 DIAGNOSIS — S0512XA Contusion of eyeball and orbital tissues, left eye, initial encounter: Secondary | ICD-10-CM | POA: Diagnosis not present

## 2021-05-05 DIAGNOSIS — W19XXXA Unspecified fall, initial encounter: Secondary | ICD-10-CM | POA: Diagnosis not present

## 2021-05-05 DIAGNOSIS — H353221 Exudative age-related macular degeneration, left eye, with active choroidal neovascularization: Secondary | ICD-10-CM | POA: Diagnosis not present

## 2021-05-05 DIAGNOSIS — H353112 Nonexudative age-related macular degeneration, right eye, intermediate dry stage: Secondary | ICD-10-CM | POA: Diagnosis not present

## 2021-05-05 DIAGNOSIS — Z961 Presence of intraocular lens: Secondary | ICD-10-CM | POA: Diagnosis not present

## 2021-05-05 DIAGNOSIS — H43813 Vitreous degeneration, bilateral: Secondary | ICD-10-CM | POA: Diagnosis not present

## 2021-05-10 ENCOUNTER — Ambulatory Visit
Admission: RE | Admit: 2021-05-10 | Discharge: 2021-05-10 | Disposition: A | Discharge status: Home / Self Care | Payer: Medicare Other | Source: Ambulatory Visit | Attending: Adult Health | Admitting: Adult Health

## 2021-05-10 ENCOUNTER — Other Ambulatory Visit: Payer: Self-pay

## 2021-05-10 DIAGNOSIS — M5441 Lumbago with sciatica, right side: Secondary | ICD-10-CM

## 2021-05-10 DIAGNOSIS — G8929 Other chronic pain: Secondary | ICD-10-CM | POA: Diagnosis not present

## 2021-05-10 MED ORDER — GADOBENATE DIMEGLUMINE 529 MG/ML IV SOLN
16.0000 mL | Freq: Once | INTRAVENOUS | Status: AC | PRN
  Administered 2021-05-10: 16 mL via INTRAVENOUS

## 2021-05-11 ENCOUNTER — Encounter: Payer: Self-pay | Admitting: Adult Health

## 2021-05-11 ENCOUNTER — Ambulatory Visit (INDEPENDENT_AMBULATORY_CARE_PROVIDER_SITE_OTHER): Payer: Medicare Other | Admitting: Adult Health

## 2021-05-11 VITALS — BP 140/80 | Temp 97.7°F | Ht 67.0 in | Wt 178.0 lb

## 2021-05-11 DIAGNOSIS — I10 Essential (primary) hypertension: Secondary | ICD-10-CM

## 2021-05-11 DIAGNOSIS — Z23 Encounter for immunization: Secondary | ICD-10-CM | POA: Diagnosis not present

## 2021-05-11 DIAGNOSIS — K2100 Gastro-esophageal reflux disease with esophagitis, without bleeding: Secondary | ICD-10-CM | POA: Diagnosis not present

## 2021-05-11 DIAGNOSIS — R5383 Other fatigue: Secondary | ICD-10-CM | POA: Diagnosis not present

## 2021-05-11 DIAGNOSIS — R0609 Other forms of dyspnea: Secondary | ICD-10-CM

## 2021-05-11 LAB — BASIC METABOLIC PANEL
BUN: 22 mg/dL (ref 6–23)
CO2: 30 mEq/L (ref 19–32)
Calcium: 9.9 mg/dL (ref 8.4–10.5)
Chloride: 102 mEq/L (ref 96–112)
Creatinine, Ser: 0.62 mg/dL (ref 0.40–1.20)
GFR: 85.04 mL/min (ref >60.00)
Glucose, Bld: 89 mg/dL (ref 70–99)
Potassium: 4 mEq/L (ref 3.5–5.1)
Sodium: 138 mEq/L (ref 135–145)

## 2021-05-11 LAB — TSH: TSH: 1.33 u[IU]/mL (ref 0.35–5.50)

## 2021-05-11 MED ORDER — ATENOLOL 50 MG PO TABS: 50.0000 mg | ORAL_TABLET | Freq: Every day | ORAL | 3 refills | Status: DC

## 2021-05-11 NOTE — Progress Notes (Signed)
Subjective:    Patient ID: Shelley Coleman, female    DOB: 05-11-1940, 81 y.o.   MRN: [id number]  HPI 81 year old female who  has a past medical history of Anemia, Breast cancer ([place]), Cerebrovascular disease (11/12/2018), Depression, Depression, GERD (gastroesophageal reflux disease), Hiatal hernia, History of GI bleed, mitral valve prolapse, Hyperlipidemia, Irregular heart beat, Neuropathy, Nocturnal leg cramps (09/02/2015), Optic neuritis, Osteopenia, Ovarian cyst, Parotid tumor, Pelvic fracture ([place]), Restless legs syndrome, Shingles (2000), and Stroke ([place]).  She presents to the office today for multiple issues.  First issue is that of elevated blood pressure.  Back in July 2022 she had a hospital admission for upper GI bleed.  At this time she was experiencing lightheaded and dizziness and thought to be from orthostatic hypotension.  She was managed with IV fluids, her atenolol was reduced to 12.5 mg daily and HCTZ was held.  At home she has been checking her blood pressure and getting readings from 1 30-1 80 over 80s to 90s.  She will have spikes were her blood pressure increases and she will develop a headache.  She has been titrating her atenolol at home taking upwards of 50 mg( her old dose) helps lower her blood pressure back down to normal levels.  BP Readings from Last 3 Encounters:  05/11/21 140/80  04/18/21 136/83  01/17/21 128/72     Externally, since July when she was in the hospital and also incidentally diagnosed with COVID-19 she has had chronic fatigue as well as dyspnea on exertion.  She does eat healthy and tries to exercise but has noticed that with exercise, going up incline, pulling her-bends to the road, etc. she experiences shortness of breath and fatigue.  She does have a very strong history of cardiac disease with her father and 2 brothers who have had multiple stents placed as well as multiple MIs.  She denies chest pain.  Her iron levels were stable back in November  2022.  She is also porting continued GERD symptoms, in the past was on Dexilant but insurance stopped paying for it and she could not afford the co-pay of $300.  She was placed on Protonix after her GI bleed but since being on Protonix for the last 6 months she has not had much relief.  Has burning in her esophagus daily, no matter what she eats.  She does have a follow-up appointment with her gastroenterologist next month and will discuss this then.  Review of Systems See HPI   Past Medical History:  Diagnosis Date   Anemia    Has had iron infusions   Breast cancer ([place])    right   Cerebrovascular disease 11/12/2018   Depression    Depression    GERD (gastroesophageal reflux disease)    Hiatal hernia    History of GI bleed    Hx of mitral valve prolapse    Hyperlipidemia    Irregular heart beat    Neuropathy    numbness in both feet   Nocturnal leg cramps 09/02/2015   Optic neuritis    left   Osteopenia    Ovarian cyst    Parotid tumor    right   Pelvic fracture ([facility])    Restless legs syndrome    Shingles 2000   Left in V1   Stroke [facility])     Social History   Socioeconomic History   Marital status: Married    Spouse name: Not on file   Number of children:  2   Years of education: 43   Highest education level: Not on file  Occupational History   Occupation: Retired Therapist, sports  Tobacco Use   Smoking status: Never   Smokeless tobacco: Never  Vaping Use   Vaping Use: Never used  Substance and Sexual Activity   Alcohol use: Yes    Alcohol/week: 1.0 standard drink    Types: 1 Glasses of wine per week    Comment: Rarely,socially   Drug use: No   Sexual activity: Not on file  Other Topics Concern   Not on file  Social History Narrative   Retired from nursing    Widowed    Left-handed      Social Determinants of Radio broadcast assistant Strain: Low Risk    Difficulty of Paying Living Expenses: Not hard at all  Food Insecurity: No Food Insecurity   Worried About  Charity fundraiser in the Last Year: Never true   Arboriculturist in the Last Year: Never true  Transportation Needs: No Transportation Needs   Lack of Transportation (Medical): No   Lack of Transportation (Non-Medical): No  Physical Activity: Inactive   Days of Exercise per Week: 0 days   Minutes of Exercise per Session: 0 min  Stress: No Stress Concern Present   Feeling of Stress : Only a little  Social Connections: Moderately Isolated   Frequency of Communication with Friends and Family: Three times a week   Frequency of Social Gatherings with Friends and Family: Three times a week   Attends Religious Services: More than 4 times per year   Active Member of Clubs or Organizations: No   Attends Archivist Meetings: Never   Marital Status: Widowed  Human resources officer Violence: Not At Risk   Fear of Current or Ex-Partner: No   Emotionally Abused: No   Physically Abused: No   Sexually Abused: No    Past Surgical History:  Procedure Laterality Date   ABDOMINAL HYSTERECTOMY     APPENDECTOMY     BREAST LUMPECTOMY Left    CATARACT EXTRACTION     ESOPHAGEAL MANOMETRY N/A 01/13/2013   Procedure: ESOPHAGEAL MANOMETRY (EM);  Surgeon: Sable Feil, MD;  [facility];  Service: Endoscopy;  Laterality: N/A;   ESOPHAGOGASTRODUODENOSCOPY (EGD) WITH PROPOFOL N/A 10/30/2020   Procedure: ESOPHAGOGASTRODUODENOSCOPY (EGD) WITH PROPOFOL;  Surgeon: Mauri Pole, MD;  Location: [place] ENDOSCOPY;  Service: Endoscopy;  Laterality: N/A;   FOREIGN BODY REMOVAL  10/30/2020   Procedure: FOREIGN BODY REMOVAL;  Surgeon: Mauri Pole, MD;  Location: [place];  Service: Endoscopy;;   HERNIA REPAIR  08/2009   HOT HEMOSTASIS N/A 10/30/2020   Procedure: HOT HEMOSTASIS (ARGON PLASMA COAGULATION/BICAP);  Surgeon: Mauri Pole, MD;  Location: [facility] ENDOSCOPY;  Service: Endoscopy;  Laterality: N/A;   MASTECTOMY Right    SPINE SURGERY     TOTAL HIP ARTHROPLASTY Left     Family  History  Problem Relation Age of Onset   Cancer Mother        Angio sarcoma    Heart disease Father    Hypertension Father    Stroke Father        x2   Heart attack Father        x 11   Hernia Brother    Hypertension Brother    Neuropathy Neg Hx    Restless legs syndrome Neg Hx     Allergies  Allergen Reactions   Diflunisal  REACTION: rash , swelling  anaphlaxsis   Lipitor [Atorvastatin]     Pt had body joint pain   Statins     Myalgia     Tetanus Toxoid     REACTION: arm swelling, fever    Current Outpatient Medications on File Prior to Visit  Medication Sig Dispense Refill   acetaminophen (TYLENOL) 500 MG tablet Take 1,000 mg by mouth every 6 (six) hours as needed for moderate pain or headache.     ALPRAZolam (XANAX) 1 MG tablet TAKE 1 TABLET BY MOUTH AT BEDTIME AS NEEDED 30 tablet 2   clopidogrel (PLAVIX) 75 MG tablet Take 1 tablet (75 mg total) by mouth daily.     ferrous sulfate 325 (65 FE) MG tablet Take 1 tablet (325 mg total) by mouth 2 (two) times daily with a meal. 60 tablet 0   Multiple Vitamins-Minerals (CENTRUM SILVER 50+WOMEN) TABS Take 1 tablet by mouth daily.     Multiple Vitamins-Minerals (PRESERVISION AREDS 2 PO) Take 1 tablet by mouth in the morning and at bedtime.     nitroGLYCERIN (NITROSTAT) 0.4 MG SL tablet PLACE ONE TABLET UNDER THE TONGUE EVERY 5 MINUTES AS NEEDED. 100 tablet 0   pantoprazole (PROTONIX) 40 MG tablet Take 1 tablet (40 mg total) by mouth 2 (two) times daily. 180 tablet 0   Polyethyl Glycol-Propyl Glycol (SYSTANE OP) Place 1 drop into both eyes daily as needed (dry eyes).     rOPINIRole (REQUIP) 3 MG tablet Take 3 mg by mouth at bedtime as needed (rls).     vitamin B-12 (CYANOCOBALAMIN) 500 MCG tablet Take 500 mcg by mouth daily.     No current facility-administered medications on file prior to visit.    BP 140/80    Temp 97.7 F (36.5 C) (Oral)    Ht 5\' 7"  (1.702 m)    Wt 178 lb (80.7 kg)    BMI 27.88 kg/m       Objective:    Physical Exam Vitals and nursing note reviewed.  Constitutional:      Appearance: Normal appearance.  Cardiovascular:     Rate and Rhythm: Normal rate and regular rhythm.     Pulses: Normal pulses.     Heart sounds: Normal heart sounds.  Pulmonary:     Effort: Pulmonary effort is normal.     Breath sounds: Normal breath sounds.  Abdominal:     General: Abdomen is flat. Bowel sounds are normal.     Palpations: Abdomen is soft.  Musculoskeletal:        General: Normal range of motion.     Right lower leg: No edema.     Left lower leg: No edema.  Skin:    General: Skin is warm and dry.  Neurological:     General: No focal deficit present.     Mental Status: She is alert and oriented to person, place, and time.  Psychiatric:        Mood and Affect: Mood normal.        Behavior: Behavior normal.        Thought Content: Thought content normal.        Judgment: Judgment normal.       Assessment & Plan:  1. Essential hypertension  -We will increase atenolol back to 50 mg.  She will continue to monitor her blood pressure at home.  Follow-up in 30 days for CPE - atenolol (TENORMIN) 50 MG tablet; Take 1 tablet (50 mg total) by mouth daily.  Dispense: 90 tablet; Refill: 3  2. DOE (dyspnea on exertion) -Due to strong family history of cardiac disease, will refer to cardiology for further evaluation. - TSH; Future - Basic Metabolic Panel; Future - Ambulatory referral to Cardiology - Basic Metabolic Panel - TSH  3. Other fatigue  - TSH; Future - Basic Metabolic Panel; Future - Ambulatory referral to Cardiology  4. Gastroesophageal reflux disease with esophagitis without hemorrhage - Continue Protonix - Follow up with GI   Dorothyann Peng, NP

## 2021-05-11 NOTE — Patient Instructions (Addendum)
It was great seeing you today   I am going to increase your Atenolol to 50 mg daily.   I am going to check some blood work on you and will refer you to cardiology for further evaluation   Please schedule a physical in about 30 days.

## 2021-05-18 ENCOUNTER — Telehealth: Payer: Self-pay | Admitting: Adult Health

## 2021-05-18 NOTE — Telephone Encounter (Signed)
Mrs. Shelley Coleman  MRI of lumbar spine showed posterior decompression fusion at L4-5 with metal hardware.  There is multilevel degenerative changes, but no significant canal or foraminal narrowing.   There was incidental findings of right renal cyst 2.3 cm.   I have forwarded results to your primary care Nafziger, Tommi Rumps, NP, you may continue follow-up with her for your renal cyst.   Marcial Pacas, M.D. Ph.D.   [facility] [place], [place] [address] Phone: [phone] Fax:      [phone]  Written by Marcial Pacas, MD on 05/16/2021  5:07 PM EST

## 2021-05-18 NOTE — Telephone Encounter (Signed)
I called pt and gave her the results of her MRI.  She verbalized understanding.  She has appts with cardiology for SOB on exertion.  Feels fatigued/ tired all the time. She has multiple appts with providers in February.  Will follow up with pcp for renal cyst.  (She relayed that she has known about that previously).  She appreciated call.

## 2021-05-18 NOTE — Telephone Encounter (Signed)
Pt is asking for a call with results to MRI just as soon as they are available.

## 2021-06-02 ENCOUNTER — Ambulatory Visit (INDEPENDENT_AMBULATORY_CARE_PROVIDER_SITE_OTHER): Payer: Medicare Other | Admitting: Cardiology

## 2021-06-02 ENCOUNTER — Encounter (HOSPITAL_BASED_OUTPATIENT_CLINIC_OR_DEPARTMENT_OTHER): Payer: Self-pay | Admitting: Cardiology

## 2021-06-02 ENCOUNTER — Other Ambulatory Visit: Payer: Self-pay

## 2021-06-02 VITALS — BP 128/84 | HR 69 | Ht 67.0 in | Wt 177.1 lb

## 2021-06-02 DIAGNOSIS — R5382 Chronic fatigue, unspecified: Secondary | ICD-10-CM | POA: Diagnosis not present

## 2021-06-02 DIAGNOSIS — T466X5A Adverse effect of antihyperlipidemic and antiarteriosclerotic drugs, initial encounter: Secondary | ICD-10-CM

## 2021-06-02 DIAGNOSIS — R072 Precordial pain: Secondary | ICD-10-CM

## 2021-06-02 DIAGNOSIS — Z8673 Personal history of transient ischemic attack (TIA), and cerebral infarction without residual deficits: Secondary | ICD-10-CM | POA: Diagnosis not present

## 2021-06-02 DIAGNOSIS — R079 Chest pain, unspecified: Secondary | ICD-10-CM

## 2021-06-02 DIAGNOSIS — R0609 Other forms of dyspnea: Secondary | ICD-10-CM | POA: Diagnosis not present

## 2021-06-02 DIAGNOSIS — I10 Essential (primary) hypertension: Secondary | ICD-10-CM

## 2021-06-02 DIAGNOSIS — M791 Myalgia, unspecified site: Secondary | ICD-10-CM | POA: Diagnosis not present

## 2021-06-02 NOTE — Patient Instructions (Addendum)
Medication Instructions:  Your Physician recommend you continue on your current medication as directed.    *If you need a refill on your cardiac medications before your next appointment, please call your pharmacy*   Lab Work: None ordered today   Testing/Procedures: Cardiac CT Angiography (CTA), is a special type of CT scan that uses a computer to produce multi-dimensional views of major blood vessels throughout the body. In CT angiography, a contrast material is injected through an IV to help visualize the blood vessels  [facility]  Follow-Up: At [facility], you and your health needs are our priority.  As part of our continuing mission to provide you with exceptional heart care, we have created designated Provider Care Teams.  These Care Teams include your primary Cardiologist (physician) and Advanced Practice Providers (APPs -  Physician Assistants and Nurse Practitioners) who all work together to provide you with the care you need, when you need it.  We recommend signing up for the patient portal called "MyChart".  Sign up information is provided on this After Visit Summary.  MyChart is used to connect with patients for Virtual Visits (Telemedicine).  Patients are able to view lab/test results, encounter notes, upcoming appointments, etc.  Non-urgent messages can be sent to your provider as well.   To learn more about what you can do with MyChart, go to [email or web address].    Your next appointment:   1 year(s)  The format for your next appointment:   In Person  Provider:   Buford Dresser, MD     Your cardiac CT will be scheduled at one of the below locations:   [facility] [address] [place], [place] [address] [phone]  If scheduled at [facility], please arrive at the [facility] main entrance (entrance A) of [facility] 30 minutes prior to test start time. You can use the FREE valet parking offered at the main  entrance (encouraged to control the heart rate for the test) Proceed to the [facility] Radiology Department (first floor) to check-in and test prep.  If scheduled at [facility], please arrive 15 mins early for check-in and test prep.  Please follow these instructions carefully (unless otherwise directed):  On the Night Before the Test: Be sure to Drink plenty of water. Do not consume any caffeinated/decaffeinated beverages or chocolate 12 hours prior to your test. Do not take any antihistamines 12 hours prior to your test.   On the Day of the Test: Drink plenty of water until 1 hour prior to the test. Do not eat any food 4 hours prior to the test. You may take your regular medications prior to the test.  Take Atenolol 75 mg two hours prior to test. FEMALES- please wear underwire-free bra if available, avoid dresses & tight clothing       After the Test: Drink plenty of water. After receiving IV contrast, you may experience a mild flushed feeling. This is normal. On occasion, you may experience a mild rash up to 24 hours after the test. This is not dangerous. If this occurs, you can take Benadryl 25 mg and increase your fluid intake. If you experience trouble breathing, this can be serious. If it is severe call 911 IMMEDIATELY. If it is mild, please call our office. If you take any of these medications: Glipizide/Metformin, Avandament, Glucavance, please do not take 48 hours after completing test unless otherwise instructed.  We will call to schedule your test 2-4  weeks out understanding that some insurance companies will need an authorization prior to the service being performed.   For non-scheduling related questions, please contact the cardiac imaging nurse navigator should you have any questions/concerns: Marchia Bond, Cardiac Imaging Nurse Navigator Gordy Clement, Cardiac Imaging Nurse Navigator [facility] Direct Office  Dial: [phone]   For scheduling needs, including cancellations and rescheduling, please call [place], [phone].

## 2021-06-02 NOTE — Progress Notes (Signed)
Cardiology Office Note:    Date:  06/02/2021   ID:  Jayme, Mednick [birth year], MRN [id number]  PCP:  Dorothyann Peng, NP  Cardiologist:  Buford Dresser, MD  Referring MD: Dorothyann Peng, NP   CC: new patient evaluation for dyspnea on exertion  History of Present Illness:    Shelley Coleman is a 81 y.o. female with a hx of hypertension, hyperlipidemia, aortic atherosclerosis, prior CVA, history of GI bleed 10/2020 who is seen as a new consult at the request of Dorothyann Peng, NP for the evaluation and management of dyspnea on exertion.  Note from 05/11/21 from [facility] reviewed. Has noted shortness of breath and fatigue with exertion since her Covid infection 10/2020. Lives alone, takes care of her ADLs but has to rest after exertion. Can walk up to a mile but then needs to stop. Has to walk her dog.  Concern today is that she has occasional chest pain. Has also had neck and arm pain. Has had 3-4 episodes of this. Nonexertional, notices most when she lays down in bed.  Rare palpitations, chronic.  Has a strong family history of CAD. Father had 57 MI (first age 69) and 2 CVA. Both brothers have stents. Mother died of angiosarcoma at age 12.  Children are worried that she is always fatigued. She does have restless leg, doesn't sleep well. Has had two strokes, has neuropathy in her feet. Had a fall about a month ago, mechanical fall, felt like feet got tangled, fell and hit her face.  Former [employer].  Denies chest pain, shortness of breath at rest or with normal exertion. No PND, orthopnea, LE edema or unexpected weight gain. No syncope.   Past Medical History:  Diagnosis Date   Anemia    Has had iron infusions   Breast cancer ([place])    right   Cerebrovascular disease 11/12/2018   Depression    Depression    GERD (gastroesophageal reflux disease)    Hiatal hernia    History of GI bleed    Hx of mitral valve prolapse    Hyperlipidemia    Irregular heart beat     Neuropathy    numbness in both feet   Nocturnal leg cramps 09/02/2015   Optic neuritis    left   Osteopenia    Ovarian cyst    Parotid tumor    right   Pelvic fracture (HCC)    Restless legs syndrome    Shingles 2000   Left in V1   Stroke [facility])     Past Surgical History:  Procedure Laterality Date   ABDOMINAL HYSTERECTOMY     APPENDECTOMY     BREAST LUMPECTOMY Left    CATARACT EXTRACTION     ESOPHAGEAL MANOMETRY N/A 01/13/2013   Procedure: ESOPHAGEAL MANOMETRY (EM);  Surgeon: Sable Feil, MD;  Location: [facility] ENDOSCOPY;  Service: Endoscopy;  Laterality: N/A;   ESOPHAGOGASTRODUODENOSCOPY (EGD) WITH PROPOFOL N/A 10/30/2020   Procedure: ESOPHAGOGASTRODUODENOSCOPY (EGD) WITH PROPOFOL;  Surgeon: Mauri Pole, MD;  Location: [facility];  Service: Endoscopy;  Laterality: N/A;   FOREIGN BODY REMOVAL  10/30/2020   Procedure: FOREIGN BODY REMOVAL;  Surgeon: Mauri Pole, MD;  Location: [facility];  Service: Endoscopy;;   HERNIA REPAIR  08/2009   HOT HEMOSTASIS N/A 10/30/2020   Procedure: HOT HEMOSTASIS (ARGON PLASMA COAGULATION/BICAP);  Surgeon: Mauri Pole, MD;  Location: [facility] ENDOSCOPY;  Service: Endoscopy;  Laterality: N/A;   MASTECTOMY Right    SPINE SURGERY  TOTAL HIP ARTHROPLASTY Left     Current Medications: Current Outpatient Medications on File Prior to Visit  Medication Sig   acetaminophen (TYLENOL) 500 MG tablet Take 1,000 mg by mouth every 6 (six) hours as needed for moderate pain or headache.   ALPRAZolam (XANAX) 1 MG tablet Take 0.5 mg by mouth at bedtime as needed for anxiety.   atenolol (TENORMIN) 50 MG tablet Take 1 tablet (50 mg total) by mouth daily.   clopidogrel (PLAVIX) 75 MG tablet Take 1 tablet (75 mg total) by mouth daily.   ferrous sulfate 325 (65 FE) MG tablet Take 1 tablet (325 mg total) by mouth 2 (two) times daily with a meal.   Multiple Vitamins-Minerals (CENTRUM SILVER 50+WOMEN) TABS Take 1 tablet by mouth daily.   Multiple  Vitamins-Minerals (PRESERVISION AREDS 2 PO) Take 1 tablet by mouth in the morning and at bedtime.   nitroGLYCERIN (NITROSTAT) 0.4 MG SL tablet PLACE ONE TABLET UNDER THE TONGUE EVERY 5 MINUTES AS NEEDED.   pantoprazole (PROTONIX) 40 MG tablet Take 1 tablet (40 mg total) by mouth 2 (two) times daily.   Polyethyl Glycol-Propyl Glycol (SYSTANE OP) Place 1 drop into both eyes daily as needed (dry eyes).   rOPINIRole (REQUIP) 3 MG tablet Take 3 mg by mouth at bedtime as needed (rls).   vitamin B-12 (CYANOCOBALAMIN) 500 MCG tablet Take 500 mcg by mouth daily.   No current facility-administered medications on file prior to visit.     Allergies:   Diflunisal, Lipitor [atorvastatin], Statins, and Tetanus toxoid   Social History   Tobacco Use   Smoking status: Never   Smokeless tobacco: Never  Vaping Use   Vaping Use: Never used  Substance Use Topics   Alcohol use: Yes    Alcohol/week: 1.0 standard drink    Types: 1 Glasses of wine per week    Comment: Rarely,socially   Drug use: No    Family History: family history includes Cancer in her mother; Heart attack in her father; Heart disease in her father; Hernia in her brother; Hypertension in her brother and father; Stroke in her father. There is no history of Neuropathy or Restless legs syndrome.  Father had 73 MI (first age 36) and 2 CVA. Both brothers have stents. Mother died of angiosarcoma at age 48.  ROS:   Please see the history of present illness.  Additional pertinent ROS: Constitutional: Negative for chills, fever, night sweats, unintentional weight loss  HENT: Negative for ear pain and hearing loss.   Eyes: Negative for loss of vision and eye pain.  Respiratory: Negative for cough, sputum, wheezing.   Cardiovascular: See HPI. Gastrointestinal: Negative for abdominal pain, melena, and hematochezia.  Genitourinary: Negative for dysuria and hematuria.  Musculoskeletal: Negative for falls and myalgias.  Skin: Negative for itching  and rash.  Neurological: Negative for focal weakness, focal sensory changes and loss of consciousness.  Endo/Heme/Allergies: Does not bruise/bleed easily.     EKGs/Labs/Other Studies Reviewed:    The following studies were reviewed today: Echo 2017 Left ventricle: The cavity size was normal. There was mild focal    basal hypertrophy of the septum. Systolic function was normal.    The estimated ejection fraction was in the range of 55% to 60%.    Wall motion was normal; there were no regional wall motion    abnormalities. Doppler parameters are consistent with abnormal    left ventricular relaxation (grade 1 diastolic dysfunction).   Impressions:   - Normal LV systolic function;  grade 1 diastolic dysfunction;    trace TR.   EKG:  EKG is personally reviewed.   06/02/21: NSR at 69 bpm  Recent Labs: 10/30/2020: B Natriuretic Peptide 233.1; Magnesium 2.0 10/31/2020: ALT 13 03/14/2021: Hemoglobin 13.8; Platelets 220.0 05/11/2021: BUN 22; Creatinine, Ser 0.62; Potassium 4.0; Sodium 138; TSH 1.33  Recent Lipid Panel    Component Value Date/Time   CHOL 252 (H) 08/28/2019 1206   TRIG 186.0 (H) 08/28/2019 1206   HDL 56.60 08/28/2019 1206   CHOLHDL 4 08/28/2019 1206   VLDL 37.2 08/28/2019 1206   LDLCALC 158 (H) 08/28/2019 1206   LDLDIRECT 168.5 02/22/2012 0853    Physical Exam:    VS:  BP 128/84 (BP Location: Right Arm, Patient Position: Sitting, Cuff Size: Normal)    Pulse 69    Ht 5\' 7"  (1.702 m)    Wt 177 lb 1.6 oz (80.3 kg)    BMI 27.74 kg/m     Wt Readings from Last 3 Encounters:  06/02/21 177 lb 1.6 oz (80.3 kg)  05/11/21 178 lb (80.7 kg)  04/18/21 178 lb 9.6 oz (81 kg)    GEN: Well nourished, well developed in no acute distress HEENT: Normal, moist mucous membranes NECK: No JVD CARDIAC: regular rhythm, normal S1 and S2, no rubs or gallops. No murmur. VASCULAR: Radial and DP pulses 2+ bilaterally. No carotid bruits RESPIRATORY:  Clear to auscultation without rales, wheezing  or rhonchi  ABDOMEN: Soft, non-tender, non-distended MUSCULOSKELETAL:  Ambulates independently SKIN: Warm and dry, no edema NEUROLOGIC:  Alert and oriented x 3. No focal neuro deficits noted. PSYCHIATRIC:  Normal affect    ASSESSMENT:    1. Dyspnea on exertion   2. Chest pain of uncertain etiology   3. Chronic fatigue   4. History of CVA (cerebrovascular accident)   5. Myalgia due to statin   6. Essential hypertension   7. Precordial pain    PLAN:    Fatigue Chest discomfort, nonexertional Dyspnea on exertion -has been in the last year, since GI bleed and Covid (incidental Covid positive, only fever) -last echo 2017 reviewed -discussed treadmill stress, nuclear stress/lexiscan, and CT coronary angiography. Discussed pros and cons of each, including but not limited to false positive/false negative risk, radiation risk, and risk of IV contrast dye. Based on shared decision making, decision was made to pursue CT coronary angiography. I do not have prior CT chest images to determine whether coronary calcium is present. She does understand that significant calcification would limit the interpretation of the test -had BMET 05/11/21, normal -will have her take 75 mg atenolol before her test -she does not feel that atenolol affects her energy at all, but if testing unremarkable could trial change to a non-beta-blocker management of hypertension -we also discussed possibility of chronotropic incompetence playing a role, which would be another reason to trial coming off beta blocker -will be able to evaluate EF grossly on CT. If there is concern for abnormalities, would pursue echocardiogram -her father had 52 MI's, she would prefer anatomic test over functional, hence reason for CT -if cardiac workup unrevealing, does have significant GI history as potential etiology for pain as well  Hypertension -typically well controlled, though when atenolol was reduced to 10.9 mg her diastolic number was  over 100. Gradually increased to 50 mg daily. -has history of arrhythmia/PVC, reason atenolol was initially started, but found to have thyroid issues around the same time  History of CVA Hyperlipidemia -had myalgia on statin -on clopidogrel -we discussed  statin alternatives today. She would like to use the results of the CT to determine aggressiveness of therapy.  Cardiac risk counseling and prevention recommendations: -recommend heart healthy/Mediterranean diet, with whole grains, fruits, vegetable, fish, lean meats, nuts, and olive oil. Limit salt. -recommend moderate walking, 3-5 times/week for 30-50 minutes each session. Aim for at least 150 minutes.week. Goal should be pace of 3 miles/hours, or walking 1.5 miles in 30 minutes -recommend avoidance of tobacco products. Avoid excess alcohol.  Plan for follow up: 1 year if CT unremarkable, sooner if CT shows significant CAD  Total time of encounter: 60 minutes total time of encounter, including 47 minutes spent in face-to-face patient care. This time includes coordination of care and counseling regarding symptoms, options for stress testing. Remainder of non-face-to-face time involved reviewing chart documents/testing relevant to the patient encounter and documentation in the medical record.  Buford Dresser, MD, PhD, [facility]    Medication Adjustments/Labs and Tests Ordered: Current medicines are reviewed at length with the patient today.  Concerns regarding medicines are outlined above.  Orders Placed This Encounter  Procedures   CT CORONARY MORPH W/CTA COR W/SCORE W/CA W/CM &/OR WO/CM   EKG 12-Lead   No orders of the defined types were placed in this encounter.   Patient Instructions  Medication Instructions:  Your Physician recommend you continue on your current medication as directed.    *If you need a refill on your cardiac medications before your next appointment, please call your  pharmacy*   Lab Work: None ordered today   Testing/Procedures: Cardiac CT Angiography (CTA), is a special type of CT scan that uses a computer to produce multi-dimensional views of major blood vessels throughout the body. In CT angiography, a contrast material is injected through an IV to help visualize the blood vessels  [facility]  Follow-Up: At [facility], you and your health needs are our priority.  As part of our continuing mission to provide you with exceptional heart care, we have created designated Provider Care Teams.  These Care Teams include your primary Cardiologist (physician) and Advanced Practice Providers (APPs -  Physician Assistants and Nurse Practitioners) who all work together to provide you with the care you need, when you need it.  We recommend signing up for the patient portal called "MyChart".  Sign up information is provided on this After Visit Summary.  MyChart is used to connect with patients for Virtual Visits (Telemedicine).  Patients are able to view lab/test results, encounter notes, upcoming appointments, etc.  Non-urgent messages can be sent to your provider as well.   To learn more about what you can do with MyChart, go to [email or web address].    Your next appointment:   1 year(s)  The format for your next appointment:   In Person  Provider:   Buford Dresser, MD     Your cardiac CT will be scheduled at one of the below locations:   [facility] [address] [place], [place] [address] [phone]  If scheduled at [facility], please arrive at the [facility] main entrance (entrance A) of [facility] 30 minutes prior to test start time. You can use the FREE valet parking offered at the main entrance (encouraged to control the heart rate for the test) Proceed to the [facility] Radiology Department (first floor) to check-in and test prep.  If scheduled at [facility],  please arrive 15 mins early for check-in  and test prep.  Please follow these instructions carefully (unless otherwise directed):  On the Night Before the Test: Be sure to Drink plenty of water. Do not consume any caffeinated/decaffeinated beverages or chocolate 12 hours prior to your test. Do not take any antihistamines 12 hours prior to your test.   On the Day of the Test: Drink plenty of water until 1 hour prior to the test. Do not eat any food 4 hours prior to the test. You may take your regular medications prior to the test.  Take Atenolol 75 mg two hours prior to test. FEMALES- please wear underwire-free bra if available, avoid dresses & tight clothing       After the Test: Drink plenty of water. After receiving IV contrast, you may experience a mild flushed feeling. This is normal. On occasion, you may experience a mild rash up to 24 hours after the test. This is not dangerous. If this occurs, you can take Benadryl 25 mg and increase your fluid intake. If you experience trouble breathing, this can be serious. If it is severe call 911 IMMEDIATELY. If it is mild, please call our office. If you take any of these medications: Glipizide/Metformin, Avandament, Glucavance, please do not take 48 hours after completing test unless otherwise instructed.  We will call to schedule your test 2-4 weeks out understanding that some insurance companies will need an authorization prior to the service being performed.   For non-scheduling related questions, please contact the cardiac imaging nurse navigator should you have any questions/concerns: Marchia Bond, Cardiac Imaging Nurse Navigator Gordy Clement, Cardiac Imaging Nurse Navigator [facility] Direct Office Dial: [phone]   For scheduling needs, including cancellations and rescheduling, please call [place], [phone].    Signed, Buford Dresser, MD PhD 06/02/2021 6:44 PM    [place]

## 2021-06-15 ENCOUNTER — Encounter: Payer: Self-pay | Admitting: Adult Health

## 2021-06-15 ENCOUNTER — Telehealth (HOSPITAL_COMMUNITY): Payer: Self-pay | Admitting: *Deleted

## 2021-06-15 ENCOUNTER — Ambulatory Visit (INDEPENDENT_AMBULATORY_CARE_PROVIDER_SITE_OTHER): Payer: Medicare Other | Admitting: Adult Health

## 2021-06-15 VITALS — BP 122/80 | HR 70 | Temp 98.6°F | Ht 65.5 in | Wt 173.0 lb

## 2021-06-15 DIAGNOSIS — D500 Iron deficiency anemia secondary to blood loss (chronic): Secondary | ICD-10-CM

## 2021-06-15 DIAGNOSIS — E782 Mixed hyperlipidemia: Secondary | ICD-10-CM

## 2021-06-15 DIAGNOSIS — Z8673 Personal history of transient ischemic attack (TIA), and cerebral infarction without residual deficits: Secondary | ICD-10-CM | POA: Diagnosis not present

## 2021-06-15 DIAGNOSIS — I10 Essential (primary) hypertension: Secondary | ICD-10-CM | POA: Diagnosis not present

## 2021-06-15 DIAGNOSIS — G2581 Restless legs syndrome: Secondary | ICD-10-CM | POA: Diagnosis not present

## 2021-06-15 DIAGNOSIS — R0609 Other forms of dyspnea: Secondary | ICD-10-CM | POA: Diagnosis not present

## 2021-06-15 DIAGNOSIS — Z1159 Encounter for screening for other viral diseases: Secondary | ICD-10-CM | POA: Diagnosis not present

## 2021-06-15 DIAGNOSIS — F5101 Primary insomnia: Secondary | ICD-10-CM | POA: Diagnosis not present

## 2021-06-15 DIAGNOSIS — R5383 Other fatigue: Secondary | ICD-10-CM

## 2021-06-15 DIAGNOSIS — G4700 Insomnia, unspecified: Secondary | ICD-10-CM | POA: Diagnosis not present

## 2021-06-15 DIAGNOSIS — K2100 Gastro-esophageal reflux disease with esophagitis, without bleeding: Secondary | ICD-10-CM | POA: Diagnosis not present

## 2021-06-15 LAB — LIPID PANEL
Cholesterol: 263 mg/dL — ABNORMAL HIGH (ref 0–200)
HDL: 60.7 mg/dL (ref >39.00)
NonHDL: 202.28
Total CHOL/HDL Ratio: 4
Triglycerides: 211 mg/dL — ABNORMAL HIGH (ref 0.0–149.0)
VLDL: 42.2 mg/dL — ABNORMAL HIGH (ref 0.0–40.0)

## 2021-06-15 LAB — COMPREHENSIVE METABOLIC PANEL
ALT: 12 U/L (ref 0–35)
AST: 16 U/L (ref 0–37)
Albumin: 4.1 g/dL (ref 3.5–5.2)
Alkaline Phosphatase: 76 U/L (ref 39–117)
BUN: 25 mg/dL — ABNORMAL HIGH (ref 6–23)
CO2: 28 mEq/L (ref 19–32)
Calcium: 9.6 mg/dL (ref 8.4–10.5)
Chloride: 104 mEq/L (ref 96–112)
Creatinine, Ser: 0.7 mg/dL (ref 0.40–1.20)
GFR: 82.53 mL/min (ref >60.00)
Glucose, Bld: 78 mg/dL (ref 70–99)
Potassium: 4 mEq/L (ref 3.5–5.1)
Sodium: 139 mEq/L (ref 135–145)
Total Bilirubin: 0.5 mg/dL (ref 0.2–1.2)
Total Protein: 7.5 g/dL (ref 6.0–8.3)

## 2021-06-15 LAB — CBC WITH DIFFERENTIAL/PLATELET
Basophils Absolute: 0 10*3/uL (ref 0.0–0.1)
Basophils Relative: 0.7 % (ref 0.0–3.0)
Eosinophils Absolute: 0.1 10*3/uL (ref 0.0–0.7)
Eosinophils Relative: 1.9 % (ref 0.0–5.0)
HCT: 40.1 % (ref 36.0–46.0)
Hemoglobin: 13 g/dL (ref 12.0–15.0)
Lymphocytes Relative: 30.8 % (ref 12.0–46.0)
Lymphs Abs: 1.4 10*3/uL (ref 0.7–4.0)
MCHC: 32.4 g/dL (ref 30.0–36.0)
MCV: 87.1 fl (ref 78.0–100.0)
Monocytes Absolute: 0.6 10*3/uL (ref 0.1–1.0)
Monocytes Relative: 11.9 % (ref 3.0–12.0)
Neutro Abs: 2.6 10*3/uL (ref 1.4–7.7)
Neutrophils Relative %: 54.7 % (ref 43.0–77.0)
Platelets: 233 10*3/uL (ref 150.0–400.0)
RBC: 4.61 Mil/uL (ref 3.87–5.11)
RDW: 14.4 % (ref 11.5–15.5)
WBC: 4.7 10*3/uL (ref 4.0–10.5)

## 2021-06-15 LAB — TSH: TSH: 1.45 u[IU]/mL (ref 0.35–5.50)

## 2021-06-15 LAB — IBC + FERRITIN
Ferritin: 8.5 ng/mL — ABNORMAL LOW (ref 10.0–291.0)
Iron: 103 ug/dL (ref 42–145)
Saturation Ratios: 24 % (ref 20.0–50.0)
TIBC: 429.8 ug/dL (ref 250.0–450.0)
Transferrin: 307 mg/dL (ref 212.0–360.0)

## 2021-06-15 LAB — LDL CHOLESTEROL, DIRECT: Direct LDL: 185 mg/dL

## 2021-06-15 MED ORDER — TRAZODONE HCL 50 MG PO TABS
25.0000 mg | ORAL_TABLET | Freq: Every evening | ORAL | 3 refills | Status: DC | PRN
Start: 2021-06-15 — End: 2021-12-06

## 2021-06-15 NOTE — Telephone Encounter (Signed)
Attempted to call patient regarding upcoming cardiac CT appointment. °Left message on voicemail with name and callback number ° °Nafeesah Lapaglia RN Navigator Cardiac Imaging °[facility] °[phone] Office °[phone] Cell ° °

## 2021-06-15 NOTE — Telephone Encounter (Signed)
Reaching out to patient to offer assistance regarding upcoming cardiac imaging study; pt verbalizes understanding of appt date/time, parking situation and where to check in, pre-test NPO status and medications ordered, and verified current allergies; name and call back number provided for further questions should they arise  Gordy Clement RN Navigator Cardiac Imaging [facility] [phone] office [phone] cell  Patient to take 75mg  atenolol two hours prior to cardiac CT scan. She is aware to arrive at 1:30pm for her 2pm scan.  Right arm restriction for IV.

## 2021-06-15 NOTE — Progress Notes (Signed)
Subjective:    Patient ID: Shelley Coleman, female    DOB: 18-Nov-1940, 81 y.o.   MRN: [id number]  HPI Patient presents for yearly preventative medicine examination. She is a pleasant 81 year old female who  has a past medical history of Anemia, Breast cancer ([place]), Cerebrovascular disease (11/12/2018), Depression, Depression, GERD (gastroesophageal reflux disease), Hiatal hernia, History of GI bleed, mitral valve prolapse, Hyperlipidemia, Irregular heart beat, Neuropathy, Nocturnal leg cramps (09/02/2015), Optic neuritis, Osteopenia, Ovarian cyst, Parotid tumor, Pelvic fracture ([place]), Restless legs syndrome, Shingles (2000), and Stroke ([place]).  Essential Hypertension -currently managed with atenolol 50 mg daily. Denies dizziness, lightheadedness, blurred vision, or headaches.  BP Readings from Last 3 Encounters:  06/15/21 122/80  06/02/21 128/84  05/11/21 140/80   Insomnia-takes Xanax 0.5 to 1 mg as needed.  She uses it sparingly. Does not feel like she sleeps well. Has been waking up in the middle of the night.   Restless leg syndrome-is prescribed Requip by neurology.  Takes this as needed  GERD- Is seen by GI. Currently prescribed Protonix. Does not feel as though this works at all. Dexilant worked much better but insurance stopped paying for it. Has f/u with GI next week   Hyperlipidemia-intolerant to multiple statins Lab Results  Component Value Date   CHOL 252 (H) 08/28/2019   HDL 56.60 08/28/2019   LDLCALC 158 (H) 08/28/2019   LDLDIRECT 168.5 02/22/2012   TRIG 186.0 (H) 08/28/2019   CHOLHDL 4 08/28/2019   History of CVA x 2-is currently on Plavix 75 mg daily.  She denies issues with bleeding  DOE/Fatigue -has been evaluated by cardiology.  Has CT coronary angiography scheduled for tomorrow.  Normal can consider changing her blood pressure medication off of a beta-blocker to see if this helps with her chronic fatigue and DOE.  All immunizations and health maintenance protocols were  reviewed with the patient and needed orders were placed.  Appropriate screening laboratory values were ordered for the patient including screening of hyperlipidemia, renal function and hepatic function.  Medication reconciliation,  past medical history, social history, problem list and allergies were reviewed in detail with the patient  Goals were established with regard to weight loss, exercise, and  diet in compliance with medications Wt Readings from Last 3 Encounters:  06/15/21 173 lb (78.5 kg)  06/02/21 177 lb 1.6 oz (80.3 kg)  05/11/21 178 lb (80.7 kg)     Review of Systems  Constitutional:  Positive for fatigue.  HENT:  Positive for hearing loss.   Eyes: Negative.   Respiratory:  Positive for shortness of breath.   Cardiovascular: Negative.   Gastrointestinal: Negative.   Endocrine: Negative.   Genitourinary: Negative.   Musculoskeletal: Negative.   Skin: Negative.   Allergic/Immunologic: Negative.   Neurological:  Positive for weakness.  Hematological: Negative.   Psychiatric/Behavioral:  Positive for sleep disturbance.    Past Medical History:  Diagnosis Date   Anemia    Has had iron infusions   Breast cancer ([place])    right   Cerebrovascular disease 11/12/2018   Depression    Depression    GERD (gastroesophageal reflux disease)    Hiatal hernia    History of GI bleed    Hx of mitral valve prolapse    Hyperlipidemia    Irregular heart beat    Neuropathy    numbness in both feet   Nocturnal leg cramps 09/02/2015   Optic neuritis    left   Osteopenia    Ovarian  cyst    Parotid tumor    right   Pelvic fracture ([facility])    Restless legs syndrome    Shingles 2000   Left in V1   Stroke [facility])     Social History   Socioeconomic History   Marital status: Married    Spouse name: Not on file   Number of children: 2   Years of education: 16   Highest education level: Not on file  Occupational History   Occupation: Retired Therapist, sports  Tobacco Use   Smoking status:  Never   Smokeless tobacco: Never  Vaping Use   Vaping Use: Never used  Substance and Sexual Activity   Alcohol use: Yes    Alcohol/week: 1.0 standard drink    Types: 1 Glasses of wine per week    Comment: Rarely,socially   Drug use: No   Sexual activity: Not on file  Other Topics Concern   Not on file  Social History Narrative   Retired from nursing    Widowed    Left-handed      Social Determinants of Radio broadcast assistant Strain: Low Risk    Difficulty of Paying Living Expenses: Not hard at all  Food Insecurity: No Food Insecurity   Worried About Charity fundraiser in the Last Year: Never true   Arboriculturist in the Last Year: Never true  Transportation Needs: No Transportation Needs   Lack of Transportation (Medical): No   Lack of Transportation (Non-Medical): No  Physical Activity: Inactive   Days of Exercise per Week: 0 days   Minutes of Exercise per Session: 0 min  Stress: No Stress Concern Present   Feeling of Stress : Only a little  Social Connections: Moderately Isolated   Frequency of Communication with Friends and Family: Three times a week   Frequency of Social Gatherings with Friends and Family: Three times a week   Attends Religious Services: More than 4 times per year   Active Member of Clubs or Organizations: No   Attends Archivist Meetings: Never   Marital Status: Widowed  Human resources officer Violence: Not At Risk   Fear of Current or Ex-Partner: No   Emotionally Abused: No   Physically Abused: No   Sexually Abused: No    Past Surgical History:  Procedure Laterality Date   ABDOMINAL HYSTERECTOMY     APPENDECTOMY     BREAST LUMPECTOMY Left    CATARACT EXTRACTION     ESOPHAGEAL MANOMETRY N/A 01/13/2013   Procedure: ESOPHAGEAL MANOMETRY (EM);  Surgeon: Sable Feil, MD;  Location: [facility];  Service: Endoscopy;  Laterality: N/A;   ESOPHAGOGASTRODUODENOSCOPY (EGD) WITH PROPOFOL N/A 10/30/2020   Procedure:  ESOPHAGOGASTRODUODENOSCOPY (EGD) WITH PROPOFOL;  Surgeon: Mauri Pole, MD;  Location: [place] [facility];  Service: Endoscopy;  Laterality: N/A;   FOREIGN BODY REMOVAL  10/30/2020   Procedure: FOREIGN BODY REMOVAL;  Surgeon: Mauri Pole, MD;  Location: [place];  Service: Endoscopy;;   HERNIA REPAIR  08/2009   HOT HEMOSTASIS N/A 10/30/2020   Procedure: HOT HEMOSTASIS (ARGON PLASMA COAGULATION/BICAP);  Surgeon: Mauri Pole, MD;  [facility];  Service: Endoscopy;  Laterality: N/A;   MASTECTOMY Right    SPINE SURGERY     TOTAL HIP ARTHROPLASTY Left     Family History  Problem Relation Age of Onset   Cancer Mother        Angio sarcoma    Heart disease Father    Hypertension Father  Stroke Father        x2   Heart attack Father        x 11   Hernia Brother    Hypertension Brother    Neuropathy Neg Hx    Restless legs syndrome Neg Hx     Allergies  Allergen Reactions   Diflunisal     REACTION: rash , swelling  anaphlaxsis   Lipitor [Atorvastatin]     Pt had body joint pain   Statins     Myalgia     Tetanus Toxoid     REACTION: arm swelling, fever    Current Outpatient Medications on File Prior to Visit  Medication Sig Dispense Refill   acetaminophen (TYLENOL) 500 MG tablet Take 1,000 mg by mouth every 6 (six) hours as needed for moderate pain or headache.     ALPRAZolam (XANAX) 1 MG tablet Take 0.5 mg by mouth at bedtime as needed for anxiety.     atenolol (TENORMIN) 50 MG tablet Take 1 tablet (50 mg total) by mouth daily. 90 tablet 3   clopidogrel (PLAVIX) 75 MG tablet Take 1 tablet (75 mg total) by mouth daily.     ferrous sulfate 325 (65 FE) MG tablet Take 1 tablet (325 mg total) by mouth 2 (two) times daily with a meal. 60 tablet 0   Multiple Vitamins-Minerals (CENTRUM SILVER 50+WOMEN) TABS Take 1 tablet by mouth daily.     Multiple Vitamins-Minerals (PRESERVISION AREDS 2 PO) Take 1 tablet by mouth in the morning and at bedtime.      nitroGLYCERIN (NITROSTAT) 0.4 MG SL tablet PLACE ONE TABLET UNDER THE TONGUE EVERY 5 MINUTES AS NEEDED. 100 tablet 0   pantoprazole (PROTONIX) 40 MG tablet Take 1 tablet (40 mg total) by mouth 2 (two) times daily. 180 tablet 0   Polyethyl Glycol-Propyl Glycol (SYSTANE OP) Place 1 drop into both eyes daily as needed (dry eyes).     rOPINIRole (REQUIP) 3 MG tablet Take 3 mg by mouth at bedtime as needed (rls).     vitamin B-12 (CYANOCOBALAMIN) 500 MCG tablet Take 500 mcg by mouth daily.     No current facility-administered medications on file prior to visit.    BP 122/80    Pulse 70    Temp 98.6 F (37 C) (Oral)    Ht 5' 5.5" (1.664 m)    Wt 173 lb (78.5 kg)    SpO2 99%    BMI 28.35 kg/m       Objective:   Physical Exam Vitals and nursing note reviewed.  Constitutional:      General: She is not in acute distress.    Appearance: Normal appearance. She is well-developed. She is not ill-appearing.  HENT:     Head: Normocephalic and atraumatic.     Right Ear: Tympanic membrane, ear canal and external ear normal. There is no impacted cerumen.     Left Ear: Tympanic membrane, ear canal and external ear normal. There is no impacted cerumen.     Nose: Nose normal. No congestion or rhinorrhea.     Mouth/Throat:     Mouth: Mucous membranes are moist.     Pharynx: Oropharynx is clear. No oropharyngeal exudate or posterior oropharyngeal erythema.  Eyes:     General:        Right eye: No discharge.        Left eye: No discharge.     Extraocular Movements: Extraocular movements intact.     Conjunctiva/sclera: Conjunctivae normal.  Pupils: Pupils are equal, round, and reactive to light.  Neck:     Thyroid: No thyromegaly.     Vascular: No carotid bruit.     Trachea: No tracheal deviation.  Cardiovascular:     Rate and Rhythm: Normal rate and regular rhythm.     Pulses: Normal pulses.     Heart sounds: Normal heart sounds. No murmur heard.   No friction rub. No gallop.  Pulmonary:      Effort: Pulmonary effort is normal. No respiratory distress.     Breath sounds: Normal breath sounds. No stridor. No wheezing, rhonchi or rales.  Chest:     Chest wall: No tenderness.  Abdominal:     General: Abdomen is flat. Bowel sounds are normal. There is no distension.     Palpations: Abdomen is soft. There is no mass.     Tenderness: There is no abdominal tenderness. There is no right CVA tenderness, left CVA tenderness, guarding or rebound.     Hernia: No hernia is present.  Musculoskeletal:        General: No swelling, tenderness, deformity or signs of injury. Normal range of motion.     Cervical back: Normal range of motion and neck supple.     Right lower leg: No edema.     Left lower leg: No edema.  Lymphadenopathy:     Cervical: No cervical adenopathy.  Skin:    General: Skin is warm and dry.     Coloration: Skin is not jaundiced or pale.     Findings: No bruising, erythema, lesion or rash.  Neurological:     General: No focal deficit present.     Mental Status: She is alert and oriented to person, place, and time.     Cranial Nerves: No cranial nerve deficit.     Sensory: No sensory deficit.     Motor: No weakness.     Coordination: Coordination normal.     Gait: Gait normal.     Deep Tendon Reflexes: Reflexes normal.  Psychiatric:        Mood and Affect: Mood normal.        Behavior: Behavior normal.        Thought Content: Thought content normal.        Judgment: Judgment normal.      Assessment & Plan:  1. Essential hypertension - Well controlled - CBC with Differential/Platelet; Future - Comprehensive metabolic panel; Future - Lipid panel; Future - TSH; Future - IBC + Ferritin; Future - CBC with Differential/Platelet - Comprehensive metabolic panel - Lipid panel - TSH - IBC + Ferritin  2. Gastroesophageal reflux disease with esophagitis without hemorrhage - Follow up with GI as directed - CBC with Differential/Platelet; Future - Comprehensive  metabolic panel; Future - Lipid panel; Future - TSH; Future - IBC + Ferritin; Future - CBC with Differential/Platelet - Comprehensive metabolic panel - Lipid panel - TSH - IBC + Ferritin  3. Restless legs syndrome (RLS)  - CBC with Differential/Platelet; Future - Comprehensive metabolic panel; Future - Lipid panel; Future - TSH; Future - IBC + Ferritin; Future - CBC with Differential/Platelet - Comprehensive metabolic panel - Lipid panel - TSH - IBC + Ferritin  4. Iron deficiency anemia due to chronic blood loss - Consider oral iron  - CBC with Differential/Platelet; Future - Comprehensive metabolic panel; Future - Lipid panel; Future - TSH; Future - IBC + Ferritin; Future - CBC with Differential/Platelet - Comprehensive metabolic panel - Lipid panel - TSH -  IBC + Ferritin  5. Mixed hyperlipidemia  - CBC with Differential/Platelet; Future - Comprehensive metabolic panel; Future - Lipid panel; Future - TSH; Future - IBC + Ferritin; Future - CBC with Differential/Platelet - Comprehensive metabolic panel - Lipid panel - TSH - IBC + Ferritin   6. H/O: CVA (cerebrovascular accident) - Continue Plavix  - CBC with Differential/Platelet; Future - Comprehensive metabolic panel; Future - Lipid panel; Future - TSH; Future - IBC + Ferritin; Future - CBC with Differential/Platelet - Comprehensive metabolic panel - Lipid panel - TSH - IBC + Ferritin  7. Need for hepatitis C screening test  - Hep C Antibody; Future - Hep C Antibody  8. Insomnia, unspecified type - Will trial her on Trazodone. She knows not to take this medication with xanax  - traZODone (DESYREL) 50 MG tablet; Take 0.5-1 tablets (25-50 mg total) by mouth at bedtime as needed for sleep.  Dispense: 30 tablet; Refill: 3  9. DOE (dyspnea on exertion)   10. Other fatigue - Consider changing BP medication  - CBC with Differential/Platelet; Future - Comprehensive metabolic panel; Future -  Lipid panel; Future - TSH; Future - IBC + Ferritin; Future - CBC with Differential/Platelet - Comprehensive metabolic panel - Lipid panel - TSH - IBC + Ferritin  Dorothyann Peng, NP

## 2021-06-15 NOTE — Patient Instructions (Addendum)
It was great seeing you today   We will follow up with you regarding your lab work   Please let me know if you need anything   

## 2021-06-16 ENCOUNTER — Other Ambulatory Visit: Payer: Self-pay

## 2021-06-16 ENCOUNTER — Ambulatory Visit (HOSPITAL_COMMUNITY)
Admission: RE | Admit: 2021-06-16 | Discharge: 2021-06-16 | Disposition: A | Discharge status: Home / Self Care | Payer: Medicare Other | Source: Ambulatory Visit | Attending: Cardiology | Admitting: Cardiology

## 2021-06-16 ENCOUNTER — Encounter (HOSPITAL_COMMUNITY): Payer: Self-pay

## 2021-06-16 DIAGNOSIS — R072 Precordial pain: Secondary | ICD-10-CM | POA: Insufficient documentation

## 2021-06-16 DIAGNOSIS — R931 Abnormal findings on diagnostic imaging of heart and coronary circulation: Secondary | ICD-10-CM | POA: Diagnosis not present

## 2021-06-16 DIAGNOSIS — I2510 Atherosclerotic heart disease of native coronary artery without angina pectoris: Secondary | ICD-10-CM

## 2021-06-16 DIAGNOSIS — R079 Chest pain, unspecified: Secondary | ICD-10-CM | POA: Diagnosis not present

## 2021-06-16 DIAGNOSIS — I700 Atherosclerosis of aorta: Secondary | ICD-10-CM | POA: Insufficient documentation

## 2021-06-16 LAB — HEPATITIS C ANTIBODY
Hepatitis C Ab: NONREACTIVE
SIGNAL TO CUT-OFF: <0.02 (ref <1.00)

## 2021-06-16 MED ORDER — NITROGLYCERIN 0.4 MG SL SUBL
SUBLINGUAL_TABLET | SUBLINGUAL | Status: AC
  Filled 2021-06-16: qty 2

## 2021-06-16 MED ORDER — NITROGLYCERIN 0.4 MG SL SUBL
0.8000 mg | SUBLINGUAL_TABLET | Freq: Once | SUBLINGUAL | Status: AC
Start: 2021-06-16 — End: 2021-06-16
  Administered 2021-06-16: 0.8 mg via SUBLINGUAL

## 2021-06-16 MED ORDER — IOHEXOL 350 MG/ML SOLN
95.0000 mL | Freq: Once | INTRAVENOUS | Status: AC | PRN
  Administered 2021-06-16: 95 mL via INTRAVENOUS

## 2021-06-17 MED ORDER — EZETIMIBE 10 MG PO TABS
10.0000 mg | ORAL_TABLET | Freq: Every day | ORAL | 0 refills | Status: DC
Start: 2021-06-17 — End: 2021-08-17

## 2021-06-17 MED ORDER — CLOPIDOGREL BISULFATE 75 MG PO TABS: 75.0000 mg | ORAL_TABLET | Freq: Every day | ORAL | 3 refills | Status: DC

## 2021-06-17 NOTE — Addendum Note (Signed)
Addended by: Nilda Riggs on: 06/17/2021 09:59 AM   Modules accepted: Orders

## 2021-06-17 NOTE — Addendum Note (Signed)
Addended by: Nilda Riggs on: 06/17/2021 10:13 AM   Modules accepted: Orders

## 2021-06-20 ENCOUNTER — Telehealth: Payer: Medicare Other

## 2021-06-20 ENCOUNTER — Telehealth: Payer: Self-pay | Admitting: Gastroenterology

## 2021-06-20 NOTE — Telephone Encounter (Signed)
The pt had recent labs with PCP and wanted to confirm if she will need any further labs for Dr Ardis Hughs.  Per Dr Ardis Hughs the pt needed CBC prior to appt and her PCP did include that with labs done on 2/15.  She will not come in now for any additional labs and will keep appt as planned for 2/22

## 2021-06-20 NOTE — Telephone Encounter (Signed)
Inbound call from patient stated that she is coming in on 2/22 to see Dr. Ardis Hughs and that she was told that she needed to come in today for lab work.  Patient states that she had blood work done last week for her physical. Patient is seeking advice if she really needs to come in today for labs again. Please advise.

## 2021-06-22 ENCOUNTER — Telehealth: Payer: Self-pay

## 2021-06-22 ENCOUNTER — Encounter: Payer: Self-pay | Admitting: Gastroenterology

## 2021-06-22 ENCOUNTER — Ambulatory Visit (INDEPENDENT_AMBULATORY_CARE_PROVIDER_SITE_OTHER): Payer: Medicare Other | Admitting: Gastroenterology

## 2021-06-22 DIAGNOSIS — D500 Iron deficiency anemia secondary to blood loss (chronic): Secondary | ICD-10-CM

## 2021-06-22 DIAGNOSIS — K449 Diaphragmatic hernia without obstruction or gangrene: Secondary | ICD-10-CM | POA: Diagnosis not present

## 2021-06-22 DIAGNOSIS — R1013 Epigastric pain: Secondary | ICD-10-CM | POA: Diagnosis not present

## 2021-06-22 MED ORDER — FAMOTIDINE 20 MG PO TABS: 20.0000 mg | ORAL_TABLET | Freq: Two times a day (BID) | ORAL | Status: DC

## 2021-06-22 MED ORDER — OMEPRAZOLE 40 MG PO CPDR: DELAYED_RELEASE_CAPSULE | ORAL | 3 refills | Status: DC

## 2021-06-22 NOTE — Patient Instructions (Addendum)
If you are age 79 or older, your body mass index should be between 23-30. Your Body mass index is 29.19 kg/m. If this is out of the aforementioned range listed, please consider follow up with your Primary Care Provider. ________________________________________________________  The [facility] providers would like to encourage you to use [facility] to communicate with providers for non-urgent requests or questions.  Due to long hold times on the telephone, sending your provider a message by [facility] may be a faster and more efficient way to get a response.  Please allow 48 business hours for a response.  Please remember that this is for non-urgent requests.  _______________________________________________________  Dennis Bast have been scheduled for a colonoscopy. Please follow written instructions given to you at your visit today.  Please pick up your prep supplies at the pharmacy within the next 1-3 days. If you use inhalers (even only as needed), please bring them with you on the day of your procedure.  We have sent the following medications to your pharmacy for you to pick up at your convenience:  START: omeprazole 40mg  one capsule shortly before breakfast and dinner meal each day.  Please purchase the following medications over the counter and take as directed:  START: famotidine 20mg  one tablet at bedtime each night.  Due to recent changes in healthcare laws, you may see the results of your imaging and laboratory studies on MyChart before your provider has had a chance to review them.  We understand that in some cases there may be results that are confusing or concerning to you. Not all laboratory results come back in the same time frame and the provider may be waiting for multiple results in order to interpret others.  Please give [place] 48 hours in order for your provider to thoroughly review all the results before contacting the office for clarification of your results.   Thank you for entrusting me with  your care and choosing [facility].  Dr Ardis Hughs

## 2021-06-22 NOTE — Telephone Encounter (Signed)
Pre-operative Risk Assessment     Request for surgical clearance:     Endoscopy Procedure  What type of surgery is being performed?     Colonoscopy  When is this surgery scheduled?     07-20-21  What type of clearance is required ?   Pharmacy  Are there any medications that need to be held prior to surgery and how long? Plavix x 5 days  Practice name and name of physician performing surgery?      [facility]  What is your office phone and fax number?      Phone- [phone]  [phone]  Anesthesia type (None, local, MAC, general) ?       MAC

## 2021-06-22 NOTE — Telephone Encounter (Signed)
Patient advised that she has been given clearance to hold Plavix 5 days prior to colonoscopy scheduled for 07-20-21.  Patient advised to take last dose of Plavix on 07-14-21, and she will be advised when to restart Plavix by Dr Ardis Hughs after the procedure.  Patient agreed to plan and verbalized understanding.  No further questions.

## 2021-06-22 NOTE — Progress Notes (Signed)
Review of pertinent gastrointestinal problems: 1.  Routine risk for colon cancer.  Colonoscopy March 2011 Dr. Sharlett Iles had a poor prep, no polyps noted. 2.  Iron deficiency anemia: Hospitalization July 2022 hemoglobin 7.1.  EGD July 2022 showed a large hiatal hernia, Cameron's erosion.  Also duodenal AVM which was treated with APC.  Plavix daily likely contributes.  Hemoglobin repeat check August 2022 normal, November 2022 normal, February 2023 normal.  HPI: This is a very pleasant 81 year old woman   She was last here in our office August 2022 and she saw [facility].  That was for follow-up from a hospital stay the month prior for iron deficiency anemia.  Her anemia was felt very possibly due to large hiatal hernia, Cameron's erosion, duodenal AVM noted by EGD July 2022.  Her weight is up 2 pounds since her last office visit here 6 months ago.  She has overall been miserable for the past several months with significant indigestion.  She has discomfort in her chest that are worse after eating.  She will describe food occasionally hanging catching as it goes down.  She has constant indigestion.  Previously she was on Dexilant and it cost her $300 a month but it did seem to help a lot of her upper GI symptoms.  More recently she was on Protonix twice daily when she left the hospital.  She stopped that medicine and rightly has been taking omeprazole 20 mg pills once daily or twice daily.  This does not seem to help her indigestion in her chest discomforts.  She will take a lot of Rolaids.  She has been on iron 1 pill once daily for quite a while.  This was weaned down from twice daily a couple months ago.  She has no significant issues with her bowels.  No obvious GI bleeding.  Colon cancer does not run in her family.  She had cardiac testing last week with a cardiac CT.  I can see the results in epic but I cannot interpret it very well at all.  There are no notes from her cardiologist about the  results.  She takes Plavix once daily.   ROS: complete GI ROS as described in HPI, all other review negative.  Constitutional:  No unintentional weight loss   Past Medical History:  Diagnosis Date   Anemia    Has had iron infusions   Breast cancer ([facility])    right   Cerebrovascular disease 11/12/2018   Depression    Depression    GERD (gastroesophageal reflux disease)    Hiatal hernia    History of GI bleed    Hx of mitral valve prolapse    Hyperlipidemia    Irregular heart beat    Neuropathy    numbness in both feet   Nocturnal leg cramps 09/02/2015   Optic neuritis    left   Osteopenia    Ovarian cyst    Parotid tumor    right   Pelvic fracture (H[facility])    Restless legs syndrome    Shingles 2000   Left in V1   Stroke [facility])     Past Surgical History:  Procedure Laterality Date   ABDOMINAL HYSTERECTOMY     APPENDECTOMY     BREAST LUMPECTOMY Left    CATARACT EXTRACTION     ESOPHAGEAL MANOMETRY N/A 01/13/2013   Procedure: ESOPHAGEAL MANOMETRY (EM);  Surgeon: Sable Feil, MD;  Location: [facility] END[facility]COPY;  Service: Endoscopy;  Laterality: N/A;   ESOPHAGOGASTRODUODENOSCOPY (EGD) WITH PROPOFOL  N/A 10/30/2020   Procedure: ESOPHAGOGASTRODUODENOSCOPY (EGD) WITH PROPOFOL;  Surgeon: Mauri Pole, MD;  Location: [facility]COPY;  Service: Endoscopy;  Laterality: N/A;   FOREIGN BODY REMOVAL  10/30/2020   Procedure: FOREIGN BODY REMOVAL;  Surgeon: Mauri Pole, MD;  Location: [facility];  Service: Endoscopy;;   HERNIA REPAIR  08/2009   HOT HEMOSTASIS N/A 10/30/2020   Procedure: HOT HEMOSTASIS (ARGON PLASMA COAGULATION/BICAP);  Surgeon: Mauri Pole, MD;  Location: [facility] ENDOSCOPY;  Service: Endoscopy;  Laterality: N/A;   MASTECTOMY Right    SPINE SURGERY     TOTAL HIP ARTHROPLASTY Left     Current Outpatient Medications  Medication Sig Dispense Refill   acetaminophen (TYLENOL) 500 MG tablet Take 1,000 mg by mouth every 6 (six) hours as needed for moderate pain or  headache.     atenolol (TENORMIN) 50 MG tablet Take 1 tablet (50 mg total) by mouth daily. 90 tablet 3   clopidogrel (PLAVIX) 75 MG tablet Take 1 tablet (75 mg total) by mouth daily. 90 tablet 3   ezetimibe (ZETIA) 10 MG tablet Take 1 tablet (10 mg total) by mouth daily. 90 tablet 0   ferrous sulfate 325 (65 FE) MG tablet Take 325 mg by mouth daily with breakfast.     Multiple Vitamins-Minerals (CENTRUM SILVER 50+WOMEN) TABS Take 1 tablet by mouth daily.     Multiple Vitamins-Minerals (PRESERVISION AREDS 2 PO) Take 1 tablet by mouth in the morning and at bedtime.     nitroGLYCERIN (NITROSTAT) 0.4 MG SL tablet PLACE ONE TABLET UNDER THE TONGUE EVERY 5 MINUTES AS NEEDED. 100 tablet 0   Polyethyl Glycol-Propyl Glycol (SYSTANE OP) Place 1 drop into both eyes daily as needed (dry eyes).     rOPINIRole (REQUIP) 3 MG tablet Take 3 mg by mouth at bedtime as needed (rls).     traZODone (DESYREL) 50 MG tablet Take 0.5-1 tablets (25-50 mg total) by mouth at bedtime as needed for sleep. 30 tablet 3   vitamin B-12 (CYANOCOBALAMIN) 500 MCG tablet Take 500 mcg by mouth daily.     No current facility-administered medications for this visit.    Allergies as of 06/22/2021 - Review Complete 06/22/2021  Allergen Reaction Noted   Diflunisal  03/07/2007   Lipitor [atorvastatin]  09/13/2016   Statins  03/13/2018   Tetanus toxoid  03/07/2007    Family History  Problem Relation Age of Onset   Cancer Mother        Angio sarcoma    Heart disease Father    Hypertension Father    Stroke Father        x2   Heart attack Father        x 11   Hernia Brother    Hypertension Brother    Neuropathy Neg Hx    Restless legs syndrome Neg Hx    Colon cancer Neg Hx    Esophageal cancer Neg Hx    Pancreatic cancer Neg Hx    Stomach cancer Neg Hx     Social History   Socioeconomic History   Marital status: Married    Spouse name: Not on file   Number of children: 2   Years of education: 16   Highest education  level: Not on file  Occupational History   Occupation: Retired Therapist, sports  Tobacco Use   Smoking status: Never   Smokeless tobacco: Never  Vaping Use   Vaping Use: Never used  Substance and Sexual Activity   Alcohol use: Yes  Alcohol/week: 1.0 standard drink    Types: 1 Glasses of wine per week    Comment: Rarely,socially   Drug use: No   Sexual activity: Not on file  Other Topics Concern   Not on file  Social History Narrative   Retired from nursing    Widowed    Left-handed      Social Determinants of Radio broadcast assistant Strain: Low Risk    Difficulty of Paying Living Expenses: Not hard at all  Food Insecurity: No Food Insecurity   Worried About Charity fundraiser in the Last Year: Never true   Arboriculturist in the Last Year: Never true  Transportation Needs: No Transportation Needs   Lack of Transportation (Medical): No   Lack of Transportation (Non-Medical): No  Physical Activity: Inactive   Days of Exercise per Week: 0 days   Minutes of Exercise per Session: 0 min  Stress: No Stress Concern Present   Feeling of Stress : Only a little  Social Connections: Moderately Isolated   Frequency of Communication with Friends and Family: Three times a week   Frequency of Social Gatherings with Friends and Family: Three times a week   Attends Religious Services: More than 4 times per year   Active Member of Clubs or Organizations: No   Attends Archivist Meetings: Never   Marital Status: Widowed  Human resources officer Violence: Not At Risk   Fear of Current or Ex-Partner: No   Emotionally Abused: No   Physically Abused: No   Sexually Abused: No     Physical Exam: BP 130/78    Pulse 66    Ht 5' 5.5" (1.664 m)    Wt 178 lb 2 oz (80.8 kg)    SpO2 97%    BMI 29.19 kg/m  Constitutional: generally well-appearing Psychiatric: alert and oriented x3 Abdomen: soft, nontender, nondistended, no obvious ascites, no peritoneal signs, normal bowel sounds No peripheral  edema noted in lower extremities  Assessment and plan: 81 y.o. female with large hiatal hernia, recent iron deficiency anemia, chronic chest discomforts, indigestion  First her severe iron deficiency anemia was attributed to Cameron's erosions from a large hiatal hernia and a single duodenal AVM.  Those indeed might be the cause however it has been over 10 years since her last colonoscopy and I explained to her that we should make sure that there are no significant colon lesions that could have been contributing as well.  This means she will need a colonoscopy at her soonest convenience.  She will need to hold her Plavix for 5 days prior we will make sure that her cardiologist are okay with her holding that medicine.  Second I do think there is a good chance that the large hiatal hernia is causing a lot of her upper GI, chest discomforts.  I explained to her that it would be ideal if we can control the symptoms with medicines alone.  If medicines do not help and she is still very bothered by symptoms from a hiatal hernia, especially a large 1, then surgery will have to be entertained.  To that end I am putting her on omeprazole 40 mg pills 1 pill twice daily shortly for her breakfast and dinner meals.  I am also asking that she start taking a famotidine 20 mg pill at bedtime every night.  Please see the "Patient Instructions" section for addition details about the plan.  Owens Loffler, MD [facility] 06/22/2021, 11:05 AM  Total time on date of encounter was 45 minutes (this included time spent preparing to see the patient reviewing records; obtaining and/or reviewing separately obtained history; performing a medically appropriate exam and/or evaluation; counseling and educating the patient and family if present; ordering medications, tests or procedures if applicable; and documenting clinical information in the health record).

## 2021-06-23 ENCOUNTER — Telehealth: Payer: Self-pay | Admitting: Cardiology

## 2021-06-23 NOTE — Telephone Encounter (Signed)
Patient is calling for her CT scan results.

## 2021-06-23 NOTE — Telephone Encounter (Signed)
Rn returned call to patient and let her know that preliminary reports available, but still need to be interpreted by MD

## 2021-06-30 ENCOUNTER — Telehealth: Payer: Self-pay | Admitting: Pharmacist

## 2021-06-30 NOTE — Chronic Care Management (AMB) (Signed)
? ? ?Chronic Care Management ?Pharmacy Assistant  ? ?Name: Shelley Coleman  MRN: [id number] DOB: 1940-09-26 ? ?Reason for Encounter: Chart prep for Initial Encounter with Shelley Coleman Clinical Pharmacist on 07/05/21 at 11 am via phone call. ?  ?Conditions to be addressed/monitored: ?CHF, HTN, HLD, Anxiety, Depression, GERD, and Anemia ? ?Recent office visits:  ?06/15/21 Shelley Peng, NP - Patient presented for essential hypertension and other concerns. Prescribed Ezetimibe 10 mg & Trazodone 25-50 mg ? ?05/11/21 Shelley Peng, NP - Patient presented for Essential hypertension and other concerns. Increased Atenolol to 50 mg daily. ? ?01/17/21 Shelley Pigg, LPN - Patient presented for Medicare Annual Wellness Exam. No medication changes. ? ?Recent consult visits:  ?06/22/21 Shelley Banister, MD Shelley Coleman) - Patient presented for Iron deficiency anemia due to chronic blood loss and other concerns. Prescribed Famotidine 20 mg, Omeprazole 40 mg. Changed Ferrous Sulfate to 325 mg. Stopped Alprazolam and Pantoprazole. ? ?06/02/21 Shelley Dresser, MD (Cardiology) - Patient presented for dyspnea on exertion and other concerns. Changed Alprazolam to 0.5 mg PRN ? ?05/05/21 Shelley Larve, MD (Ophthalmology) - Patient presented for Avastin injection. No medication changes noted. ? ?04/18/21 Shelley Givens, NP (Neurology) - Patient presented for Chronic bilateral low back pain with right sided sciatica and other concerns. No medication changes. ? ? ?Hospital visits:  ?None in previous 6 months ? ?Medications: ?Outpatient Encounter Medications as of 06/30/2021  ?Medication Sig  ? acetaminophen (TYLENOL) 500 MG tablet Take 1,000 mg by mouth every 6 (six) hours as needed for moderate pain or headache.  ? atenolol (TENORMIN) 50 MG tablet Take 1 tablet (50 mg total) by mouth daily.  ? clopidogrel (PLAVIX) 75 MG tablet Take 1 tablet (75 mg total) by mouth daily.  ? ezetimibe (ZETIA) 10 MG tablet Take 1 tablet (10 mg total) by mouth  daily.  ? famotidine (PEPCID) 20 MG tablet Take 1 tablet (20 mg total) by mouth 2 (two) times daily.  ? ferrous sulfate 325 (65 FE) MG tablet Take 325 mg by mouth daily with breakfast.  ? Multiple Vitamins-Minerals (CENTRUM SILVER 50+WOMEN) TABS Take 1 tablet by mouth daily.  ? Multiple Vitamins-Minerals (PRESERVISION AREDS 2 PO) Take 1 tablet by mouth in the morning and at bedtime.  ? nitroGLYCERIN (NITROSTAT) 0.4 MG SL tablet PLACE ONE TABLET UNDER THE TONGUE EVERY 5 MINUTES AS NEEDED.  ? omeprazole (PRILOSEC) 40 MG capsule Take one capsule shortly before breakfast and dinner meal each day  ? Polyethyl Glycol-Propyl Glycol (SYSTANE OP) Place 1 drop into both eyes daily as needed (dry eyes).  ? rOPINIRole (REQUIP) 3 MG tablet Take 3 mg by mouth at bedtime as needed (rls).  ? traZODone (DESYREL) 50 MG tablet Take 0.5-1 tablets (25-50 mg total) by mouth at bedtime as needed for sleep.  ? vitamin B-12 (CYANOCOBALAMIN) 500 MCG tablet Take 500 mcg by mouth daily.  ? ?No facility-administered encounter medications on file as of 06/30/2021.  ?Fill History : ?ATENOLOL 50MG        TAB 05/11/2021 90  ? ?CLOPIDOGREL  75 MG TABS 06/17/2021 90  ? ?EZETIMIBE  10 MG TABS 06/17/2021 90  ? ?SV IRON 65MG  TAB 11/01/2020 30  ? ?OMEPRAZOLE DR 40MG   CAP 06/22/2021 45  ? ? ? ?Have you seen any other providers since your last visit? Patient reports she has seen Gastro. ? ?Any changes in your medications or health? Patient reports other than the medication changes from [facility] no. ? ?Any side effects from any medications? Patient  reports she is often extremely tired and slow moving and does not know why. ? ?Do you have an symptoms or problems not managed by your medications? Patient reports she does not think the Trazodone works as well as it could. She reports when using half tab she still has trouble falling asleep and with the whole tab she feels funny in her head. She reports she has recently started this medication. ? ?Any concerns about  your health right now? Patient reports she has been working on her taxes and calculating her medical miles. 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 ? ?Has your provider asked that you check blood pressure, blood sugar, or follow special diet at home? Patient reports she is checking her blood pressures at home and has an arm cuff. ? ?Do you get any type of exercise on a regular basis? Patient reports she lives alone now that her husband has passed, she is active in keeping up the home and preparing meals now that the weather is warmer she has been working as much as tolerated in the yard as the sun makes her feel better. ? ?Can you think of a goal you would like to reach for your health? Patient reports she would like to figure out or remedy the fatigue. ? ?Do you have any problems getting your medications? Patient reports she likes using the [facility] near her home, she reports personal relationships with the staff there, she states she has had issues with the office renewing her medications in a timely manner and the pharmacy has been kind enough to loan her a few pills of what is needed whilst they wait on the approvals. She reports that mail order has been an issue of most her neighbors getting medications delivered to the wrong place so she does not trust it. She reports her medications are however costly to her She had an OOP total of aver 1000.00 for the year.. ? ?Is there anything that you would like to discuss during the appointment? Patient reports not at this time ? ?Patient aware to have available medications and blood pressure readings for call ? ? ? ? ?Care Gaps: ?BP- 103/78 ( 06/22/21) ?AWV- 9/22 ?Zoster Vaccine - Overdue ?TDAP - Overdue ? ?Star Rating Drugs: ?None ? ? ?Ned Clines CMA ?Clinical Pharmacist Assistant ?[phone] ? ?

## 2021-07-05 ENCOUNTER — Telehealth: Payer: Medicare Other

## 2021-07-05 NOTE — Chronic Care Management (AMB) (Signed)
Per MP Call to patient to offer later appointment date, patient accepted. ? ? ?Ned Clines CMA ?Clinical Pharmacist Assistant ?[phone] ? ?

## 2021-07-05 NOTE — Progress Notes (Unsigned)
Chronic Care Management Pharmacy Note  07/05/2021 Name:  Shelley Coleman MRN:  [id number] DOB:  Oct 06, 1940  Summary: ***  Recommendations/Changes made from today's visit: ***  Plan: ***   Subjective: Shelley Coleman is an 81 y.o. year old female who is a primary patient of Dorothyann Peng, NP.  The CCM team was consulted for assistance with disease management and care coordination needs.    Engaged with patient by telephone for initial visit in response to provider referral for pharmacy case management and/or care coordination services.   Consent to Services:  The patient was given the following information about Chronic Care Management services today, agreed to services, and gave verbal consent: 1. CCM service includes personalized support from designated clinical staff supervised by the primary care provider, including individualized plan of care and coordination with other care providers 2. 24/7 contact phone numbers for assistance for urgent and routine care needs. 3. Service will only be billed when office clinical staff spend 20 minutes or more in a month to coordinate care. 4. Only one practitioner may furnish and bill the service in a calendar month. 5.The patient may stop CCM services at any time (effective at the end of the month) by phone call to the office staff. 6. The patient will be responsible for cost sharing (co-pay) of up to 20% of the service fee (after annual deductible is met). Patient agreed to services and consent obtained.  Patient Care Team: Dorothyann Peng, NP as PCP - General (Family Medicine) Buford Dresser, MD as PCP - Cardiology (Cardiology) Kathie Rhodes, MD (Inactive) as Consulting Physician (Urology) Viona Gilmore, [facility] as Pharmacist (Pharmacist)  Recent office visits: 06/15/21 Dorothyann Peng, NP - Patient presented for essential hypertension and other concerns. Prescribed Ezetimibe 10 mg & Trazodone 25-50 mg   05/11/21 Dorothyann Peng, NP - Patient  presented for Essential hypertension and other concerns. Increased Atenolol to 50 mg daily.   01/17/21 Randel Pigg, LPN - Patient presented for Medicare Annual Wellness Exam. No medication changes.  Recent consult visits: 06/22/21 Milus Banister, MD Gertie Fey) - Patient presented for Iron deficiency anemia due to chronic blood loss and other concerns. Prescribed Famotidine 20 mg, Omeprazole 40 mg. Changed Ferrous Sulfate to 325 mg. Stopped Alprazolam and Pantoprazole.   06/02/21 Buford Dresser, MD (Cardiology) - Patient presented for dyspnea on exertion and other concerns. Changed Alprazolam to 0.5 mg PRN   05/05/21 Rella Larve, MD (Ophthalmology) - Patient presented for Avastin injection. No medication changes noted.   04/18/21 Ward Givens, NP (Neurology) - Patient presented for Chronic bilateral low back pain with right sided sciatica and other concerns. No medication changes.  Hospital visits: None in previous 6 months   Objective:  Lab Results  Component Value Date   CREATININE 0.70 06/15/2021   BUN 25 (H) 06/15/2021   GFR 82.53 06/15/2021   GFRNONAA >60 10/31/2020   GFRAA >60 11/02/2015   NA 139 06/15/2021   K 4.0 06/15/2021   CALCIUM 9.6 06/15/2021   CO2 28 06/15/2021   GLUCOSE 78 06/15/2021    Lab Results  Component Value Date/Time   HGBA1C 5.3 11/01/2015 05:08 AM   GFR 82.53 06/15/2021 10:13 AM   GFR 85.04 05/11/2021 02:53 PM    Last diabetic Eye exam: No results found for: HMDIABEYEEXA  Last diabetic Foot exam: No results found for: HMDIABFOOTEX   Lab Results  Component Value Date   CHOL 263 (H) 06/15/2021   HDL 60.70 06/15/2021   LDLCALC  158 (H) 08/28/2019   LDLDIRECT 185.0 06/15/2021   TRIG 211.0 (H) 06/15/2021   CHOLHDL 4 06/15/2021    Hepatic Function Latest Ref Rng & Units 06/15/2021 10/31/2020 10/30/2020  Total Protein 6.0 - 8.3 g/dL 7.5 5.5(L) 5.4(L)  Albumin 3.5 - 5.2 g/dL 4.1 3.1(L) 3.1(L)  AST 0 - 37 U/L 16 19 19   ALT 0 - 35 U/L  12 13 13   Alk Phosphatase 39 - 117 U/L 76 62 64  Total Bilirubin 0.2 - 1.2 mg/dL 0.5 0.5 0.9  Bilirubin, Direct 0.0 - 0.3 mg/dL - - -    Lab Results  Component Value Date/Time   TSH 1.45 06/15/2021 10:13 AM   TSH 1.33 05/11/2021 02:53 PM   FREET4 0.77 11/04/2015 02:25 PM    CBC Latest Ref Rng & Units 06/15/2021 03/14/2021 12/06/2020  WBC 4.0 - 10.5 K/uL 4.7 5.3 5.9  Hemoglobin 12.0 - 15.0 g/dL 13.0 13.8 12.1  Hematocrit 36.0 - 46.0 % 40.1 42.1 38.1  Platelets 150.0 - 400.0 K/uL 233.0 220.0 242.0    Lab Results  Component Value Date/Time   VD25OH 17 (L) 02/25/2009 09:33 PM    Clinical ASCVD: Yes  The ASCVD Risk score (Arnett DK, et al., 2019) failed to calculate for the following reasons:   The patient has a prior MI or stroke diagnosis    Depression screen [facility] 2/9 06/15/2021 01/17/2021 01/17/2021  Decreased Interest 1 0 0  Down, Depressed, Hopeless 0 0 0  PHQ - 2 Score 1 0 0  Altered sleeping 3 - -  Tired, decreased energy 3 - -  Change in appetite 1 - -  Feeling bad or failure about yourself  1 - -  Trouble concentrating 0 - -  Moving slowly or fidgety/restless 0 - -  Suicidal thoughts 0 - -  PHQ-9 Score 9 - -  Difficult doing work/chores Not difficult at all - -     ***Other: (CHADS2VASc if Afib, MMRC or CAT for COPD, ACT, DEXA)  Social History   Tobacco Use  Smoking Status Never  Smokeless Tobacco Never   BP Readings from Last 3 Encounters:  06/22/21 130/78  06/16/21 102/67  06/15/21 122/80   Pulse Readings from Last 3 Encounters:  06/22/21 66  06/16/21 66  06/15/21 70   Wt Readings from Last 3 Encounters:  06/22/21 178 lb 2 oz (80.8 kg)  06/15/21 173 lb (78.5 kg)  06/02/21 177 lb 1.6 oz (80.3 kg)   BMI Readings from Last 3 Encounters:  06/22/21 29.19 kg/m  06/15/21 28.35 kg/m  06/02/21 27.74 kg/m    Assessment/Interventions: Review of patient past medical history, allergies, medications, health status, including review of consultants reports,  laboratory and other test data, was performed as part of comprehensive evaluation and provision of chronic care management services.   SDOH:  (Social Determinants of Health) assessments and interventions performed: {yes/no:20286}  SDOH Screenings   Alcohol Screen: Low Risk    Last Alcohol Screening Score (AUDIT): 0  Depression (PHQ2-9): Medium Risk   PHQ-2 Score: 9  Financial Resource Strain: Low Risk    Difficulty of Paying Living Expenses: Not hard at all  Food Insecurity: No Food Insecurity   Worried About Charity fundraiser in the Last Year: Never true   Ran Out of Food in the Last Year: Never true  Housing: Low Risk    Last Housing Risk Score: 0  Physical Activity: Inactive   Days of Exercise per Week: 0 days   Minutes of  Exercise per Session: 0 min  Social Connections: Moderately Isolated   Frequency of Communication with Friends and Family: Three times a week   Frequency of Social Gatherings with Friends and Family: Three times a week   Attends Religious Services: More than 4 times per year   Active Member of Clubs or Organizations: No   Attends Archivist Meetings: Never   Marital Status: Widowed  Stress: No Stress Concern Present   Feeling of Stress : Only a little  Tobacco Use: Low Risk    Smoking Tobacco Use: Never   Smokeless Tobacco Use: Never   Passive Exposure: Not on file  Transportation Needs: No Transportation Needs   Lack of Transportation (Medical): No   Lack of Transportation (Non-Medical): No   Patient reports she does not think the Trazodone works as well as it could. She reports when using half tab she still has trouble falling asleep and with the whole tab she feels funny in her head. She reports she has recently started this medication.  Patient reports she is often extremely tired and slow moving and does not know why.  Patient reports she lives alone now that her husband has passed, she is active in keeping up the home and preparing  meals now that the weather is warmer she has been working as much as tolerated in the yard as the sun makes her feel better.  Patient reports she likes using the [facility] near her home, she reports personal relationships with the staff there, she states she has had issues with the office renewing her medications in a timely manner and the pharmacy has been kind enough to loan her a few pills of what is needed whilst they wait on the approvals. She reports that mail order has been an issue of most her neighbors getting medications delivered to the wrong place so she does not trust it. She reports her medications are however costly to her She had an OOP total of aver 1000.00 for the year.Marland Kitchen  CCM Care Plan  Allergies  Allergen Reactions   Diflunisal     REACTION: rash , swelling  anaphlaxsis   Lipitor [Atorvastatin]     Pt had body joint pain   Statins     Myalgia     Tetanus Toxoid     REACTION: arm swelling, fever    Medications Reviewed Today     Reviewed by Milus Banister, MD (Physician) on 06/22/21 at 1102  Med List Status: <None>   Medication Order Taking? Sig Documenting Provider Last Dose Status Informant  acetaminophen (TYLENOL) 500 MG tablet [id number] Yes Take 1,000 mg by mouth every 6 (six) hours as needed for moderate pain or headache. [provider] Taking Active Self  atenolol (TENORMIN) 50 MG tablet [id number] Yes Take 1 tablet (50 mg total) by mouth daily. Nafziger, Tommi Rumps, NP Taking Active   clopidogrel (PLAVIX) 75 MG tablet [id number] Yes Take 1 tablet (75 mg total) by mouth daily. Nafziger, Tommi Rumps, NP Taking Active   ezetimibe (ZETIA) 10 MG tablet [id number] Yes Take 1 tablet (10 mg total) by mouth daily. Nafziger, Tommi Rumps, NP Taking Active   ferrous sulfate 325 (65 FE) MG tablet [id number] Yes Take 325 mg by mouth daily with breakfast. [provider] Taking Active   Multiple Vitamins-Minerals (CENTRUM SILVER 50+WOMEN) TABS [id number] Yes Take 1 tablet by mouth  daily. [provider] Taking Active Self  Multiple Vitamins-Minerals (PRESERVISION AREDS 2 PO) [id number] Yes Take 1 tablet by mouth in  the morning and at bedtime. [provider] Taking Active Self  nitroGLYCERIN (NITROSTAT) 0.4 MG SL tablet [id number] Yes PLACE ONE TABLET UNDER THE TONGUE EVERY 5 MINUTES AS NEEDED. Dorothyann Peng, NP Taking Active Self  Polyethyl Glycol-Propyl Glycol (SYSTANE OP) [id number] Yes Place 1 drop into both eyes daily as needed (dry eyes). [provider] Taking Active Self  rOPINIRole (REQUIP) 3 MG tablet [id number] Yes Take 3 mg by mouth at bedtime as needed (rls). [provider] Taking Active Self  traZODone (DESYREL) 50 MG tablet [id number] Yes Take 0.5-1 tablets (25-50 mg total) by mouth at bedtime as needed for sleep. Nafziger, Tommi Rumps, NP Taking Active   vitamin B-12 (CYANOCOBALAMIN) 500 MCG tablet [id number] Yes Take 500 mcg by mouth daily. [provider] Taking Active Self            Patient Active Problem List   Diagnosis Date Noted   Iron deficiency anemia due to chronic blood loss    Hiatal hernia    Cameron ulcer    Postural dizziness with presyncope 10/29/2020   COVID-19 virus infection 10/29/2020   Chronic diastolic CHF (congestive heart failure) ([place]) 10/29/2020   Cerebrovascular disease 11/12/2018   Paresthesia 11/12/2015   Right arm numbness    Complicated migraine    Anxiety state    Acute ischemic stroke (HCC) 11/01/2015   Intermittent tingling of right hand and foot    HLD (hyperlipidemia)    Nocturnal leg cramps 09/02/2015   Essential hypertension 01/25/2015   Dysplastic nevus of trunk 04/01/2012   Anxiety and depression 03/19/2012   Thoracic or lumbosacral neuritis or radiculitis, unspecified 01/10/2012   Scoliosis (and kyphoscoliosis), idiopathic 01/10/2012   Malignant neoplasm of breast (female), unspecified site 01/10/2012   Essential and other specified forms of tremor 01/10/2012    Optic neuritis, unspecified 01/10/2012   Other specified visual disturbances 01/10/2012   Restless legs syndrome (RLS) 01/10/2012   Pain in limb 01/10/2012   Lumbosacral root lesions, not elsewhere classified 01/10/2012   Acute anemia 11/14/2011   Back pain 06/27/2011   Dysphasia 06/27/2011   Hereditary and idiopathic peripheral neuropathy 07/13/2009   CARCINOMA, BASAL CELL, BACK 10/05/2007   BREAST CANCER, HX OF 08/01/2007   GERD (gastroesophageal reflux disease) 03/07/2007    Immunization History  Administered Date(s) Administered   Influenza Split 01/11/2012   Influenza Whole 01/22/2009   Influenza, High Dose Seasonal PF 04/06/2014, 01/25/2015, 02/22/2016, 02/04/2017, 01/14/2019, 03/05/2020   Influenza,inj,Quad PF,6+ Mos 01/22/2013   Influenza,trivalent, recombinat, inj, PF 03/31/2011   Influenza-Unspecified 03/01/2018, 02/25/2021   PFIZER Comirnaty(Gray Top)Covid-19 Tri-Sucrose Vaccine 05/11/2021   PFIZER(Purple Top)SARS-COV-2 Vaccination 06/05/2019, 07/01/2019, 03/05/2020   Pneumococcal Conjugate-13 01/25/2015   Pneumococcal Polysaccharide-23 09/16/2007   Pneumococcal-Unspecified 01/14/2019   Td 05/01/2006   Zoster, Live 09/16/2007    Conditions to be addressed/monitored:  Hypertension, Hyperlipidemia, GERD, and Restless legs syndrome, Insomnia, History of strokes  There are no care plans that you recently modified to display for this patient.   Current Barriers:  {pharmacybarriers:24917}  Pharmacist Clinical Goal(s):  Patient will {PHARMACYGOALCHOICES:24921} through collaboration with PharmD and provider.   Interventions: 1:1 collaboration with Dorothyann Peng, NP regarding development and update of comprehensive plan of care as evidenced by provider attestation and co-signature Inter-disciplinary care team collaboration (see longitudinal plan of care) Comprehensive medication review performed; medication list updated in electronic medical record BP Readings from  Last 3 Encounters:  06/22/21 130/78  06/16/21 102/67  06/15/21 122/80    Hypertension (BP goal <130/80) -{US controlled/uncontrolled:25276} -Current  treatment: Atenolol 50 mg 1 tablet daily -Medications previously tried: ***  -Current home readings: *** -Current dietary habits: *** -Current exercise habits: *** -{ACTIONS;DENIES/REPORTS:21021675::"Denies"} hypotensive/hypertensive symptoms -Educated on {CCM BP Counseling:25124} -Counseled to monitor BP at home ***, document, and provide log at future appointments -{CCMPHARMDINTERVENTION:25122}  Hyperlipidemia: (LDL goal < 70) -Uncontrolled -Current treatment: Ezetimibe 10 mg 1 tablet daily -Medications previously tried: statins  -Current dietary patterns: *** -Current exercise habits: *** -Educated on {CCM HLD Counseling:25126} -{CCMPHARMDINTERVENTION:25122}  History of multiple strokes (Goal: ***) -{US controlled/uncontrolled:25276} -Current treatment  Clopidogrel 75 mg 1 tablet daily -Medications previously tried: ***  -{CCMPHARMDINTERVENTION:25122}  Insomnia (Goal: ***) -{US controlled/uncontrolled:25276} -Current treatment  Trazodone 50 mg 1/2-1 tablet at bedtime -Medications previously tried: ***  -{CCMPHARMDINTERVENTION:25122}  Restless legs syndrome (Goal: ***) -{US controlled/uncontrolled:25276} -Current treatment  Ropinirole 3 mg  1 tablet at bedtime as needed -Medications previously tried: ***  -{CCMPHARMDINTERVENTION:25122}  GERD (Goal: ***) -{US controlled/uncontrolled:25276} -Current treatment  Omeprazole 40 mg 1 capsule twice daily Famotidine 20 mg 1 tablet twice daily -Medications previously tried: Building surveyor (cost), pantoprazole (ineffective) -{CCMPHARMDINTERVENTION:25122}   Health Maintenance -Vaccine gaps: shingrix, tetanus -Current therapy:  Multivitamin 1 tablet daily Preservision twice daily Nitroglycerin 0.4 mg SL tablet as needed Systane eye drops as needed Vitamin B12 500 mcg 1  tablet daily Acetaminophen 500 mg 1 tablet as needed -Educated on {ccm supplement counseling:25128} -{CCM Patient satisfied:25129} -{CCMPHARMDINTERVENTION:25122}  Patient Goals/Self-Care Activities Patient will:  - {pharmacypatientgoals:24919}  Follow Up Plan: {CM FOLLOW UP WVXU:27670}   Medication Assistance: {MEDASSISTANCEINFO:25044}  Compliance/Adherence/Medication fill history: Care Gaps: Shingrix, tetanus BP- 103/78 ( 06/22/21)  Star-Rating Drugs: None  Patient's preferred pharmacy is:  [facility] [address], [place] - [address] [place] [place] [address] Phone: [phone] Fax: [phone]  Uses pill box? {Yes or If no, why not?:20788} Pt endorses ***% compliance  We discussed: {Pharmacy options:24294} Patient decided to: {US Pharmacy [facility]  Care Plan and Follow Up Patient Decision:  {FOLLOWUP:24991}  Plan: {CM FOLLOW UP TVIF:12527}  [name], PharmD, [facility] Clinical Pharmacist {MP practice sites:26434} [phone]

## 2021-07-20 ENCOUNTER — Ambulatory Visit (AMBULATORY_SURGERY_CENTER): Payer: Medicare Other | Admitting: Gastroenterology

## 2021-07-20 ENCOUNTER — Encounter: Payer: Self-pay | Admitting: Gastroenterology

## 2021-07-20 VITALS — BP 102/69 | HR 67 | Temp 98.0°F | Resp 14 | Ht 65.0 in | Wt 178.0 lb

## 2021-07-20 DIAGNOSIS — D500 Iron deficiency anemia secondary to blood loss (chronic): Secondary | ICD-10-CM

## 2021-07-20 DIAGNOSIS — D509 Iron deficiency anemia, unspecified: Secondary | ICD-10-CM | POA: Diagnosis not present

## 2021-07-20 MED ORDER — SODIUM CHLORIDE 0.9 % IV SOLN: 500.0000 mL | Freq: Once | INTRAVENOUS | Status: DC

## 2021-07-20 NOTE — Progress Notes (Signed)
Pt's states no medical or surgical changes since previsit or office visit. 

## 2021-07-20 NOTE — Op Note (Signed)
[facility] ?Patient Name: Shelley Coleman ?Procedure Date: 07/20/2021 2:56 PM ?MRN: [id number] ?Endoscopist: Milus Banister , MD ?Age: 81 ?Referring MD:  ?Date of Birth: June 08, 1940 ?Gender: Female ?Account #: [id number] ?Procedure:                Colonoscopy ?Indications:              Iron deficiency anemia ?Medicines:                Monitored Anesthesia Care ?Procedure:                Pre-Anesthesia Assessment: ?                          - Prior to the procedure, a History and Physical  ?                          was performed, and patient medications and  ?                          allergies were reviewed. The patient's tolerance of  ?                          previous anesthesia was also reviewed. The risks  ?                          and benefits of the procedure and the sedation  ?                          options and risks were discussed with the patient.  ?                          All questions were answered, and informed consent  ?                          was obtained. Prior Anticoagulants: The patient has  ?                          taken Plavix (clopidogrel), last dose was 5 days  ?                          prior to procedure. ASA Grade Assessment: III - A  ?                          patient with severe systemic disease. After  ?                          reviewing the risks and benefits, the patient was  ?                          deemed in satisfactory condition to undergo the  ?                          procedure. ?  After obtaining informed consent, the colonoscope  ?                          was passed under direct vision. Throughout the  ?                          procedure, the patient's blood pressure, pulse, and  ?                          oxygen saturations were monitored continuously. The  ?                          Olympus CF-[id number] (Serial# [id number]) Colonoscope was  ?                          introduced through the anus and advanced to the the  ?                           cecum, identified by appendiceal orifice and  ?                          ileocecal valve. The colonoscopy was performed  ?                          without difficulty. The patient tolerated the  ?                          procedure well. The quality of the bowel  ?                          preparation was good. The ileocecal valve,  ?                          appendiceal orifice, and rectum were photographed. ?Scope In: 3:12:52 PM ?Scope Out: 3:26:30 PM ?Scope Withdrawal Time: 0 hours 8 minutes 8 seconds  ?Total Procedure Duration: 0 hours 13 minutes 38 seconds  ?Findings:                 Internal hemorrhoids were found. The hemorrhoids  ?                          were small. ?                          The exam was otherwise without abnormality on  ?                          direct and retroflexion views. ?Complications:            No immediate complications. Estimated blood loss:  ?                          None. ?Estimated Blood Loss:     Estimated blood loss: none. ?Impression:               - Internal hemorrhoids. ?                          -  The examination was otherwise normal on direct  ?                          and retroflexion views. ?                          - No polyps or cancers. ?Recommendation:           - Patient has a contact number available for  ?                          emergencies. The signs and symptoms of potential  ?                          delayed complications were discussed with the  ?                          patient. Return to normal activities tomorrow.  ?                          Written discharge instructions were provided to the  ?                          patient. ?                          - Resume previous diet. ?                          - You can resume your plavix today. ?                          - You do not need any further colon cancer  ?                          screening tests (including stool testing). These  ?                          types of tests generally  stop around age 17-80. ?Milus Banister, MD ?07/20/2021 3:29:00 PM ?This report has been signed electronically. ?

## 2021-07-20 NOTE — Progress Notes (Signed)
D.T. vital signs. °

## 2021-07-20 NOTE — Patient Instructions (Signed)
Handout on hemorrhoids given. ? ?May resume Plavix today. ? ?YOU HAD AN ENDOSCOPIC PROCEDURE TODAY AT [facility]:   Refer to the procedure report that was given to you for any specific questions about what was found during the examination.  If the procedure report does not answer your questions, please call your gastroenterologist to clarify.  If you requested that your care partner not be given the details of your procedure findings, then the procedure report has been included in a sealed envelope for you to review at your convenience later. ? ?YOU SHOULD EXPECT: Some feelings of bloating in the abdomen. Passage of more gas than usual.  Walking can help get rid of the air that was put into your GI tract during the procedure and reduce the bloating. If you had a lower endoscopy (such as a colonoscopy or flexible sigmoidoscopy) you may notice spotting of blood in your stool or on the toilet paper. If you underwent a bowel prep for your procedure, you may not have a normal bowel movement for a few days. ? ?Please Note:  You might notice some irritation and congestion in your nose or some drainage.  This is from the oxygen used during your procedure.  There is no need for concern and it should clear up in a day or so. ? ?SYMPTOMS TO REPORT IMMEDIATELY: ? ?Following lower endoscopy (colonoscopy or flexible sigmoidoscopy): ? Excessive amounts of blood in the stool ? Significant tenderness or worsening of abdominal pains ? Swelling of the abdomen that is new, acute ? Fever of 100?F or higher ? ? ?For urgent or emergent issues, a gastroenterologist can be reached at any hour by calling [phone]. ?Do not use MyChart messaging for urgent concerns.  ? ? ?DIET:  We do recommend a small meal at first, but then you may proceed to your regular diet.  Drink plenty of fluids but you should avoid alcoholic beverages for 24 hours. ? ?ACTIVITY:  You should plan to take it easy for the rest of today and you  should NOT DRIVE or use heavy machinery until tomorrow (because of the sedation medicines used during the test).   ? ?FOLLOW UP: ?Our staff will call the number listed on your records 48-72 hours following your procedure to check on you and address any questions or concerns that you may have regarding the information given to you following your procedure. If we do not reach you, we will leave a message.  We will attempt to reach you two times.  During this call, we will ask if you have developed any symptoms of COVID 19. If you develop any symptoms (ie: fever, flu-like symptoms, shortness of breath, cough etc.) before then, please call [phone].  If you test positive for Covid 19 in the 2 weeks post procedure, please call and report this information to [place].   ? ?If any biopsies were taken you will be contacted by phone or by letter within the next 1-3 weeks.  Please call us at [phone] if you have not heard about the biopsies in 3 weeks.  ? ? ?SIGNATURES/CONFIDENTIALITY: ?You and/or your care partner have signed paperwork which will be entered into your electronic medical record.  These signatures attest to the fact that that the information above on your After Visit Summary has been reviewed and is understood.  Full responsibility of the confidentiality of this discharge information lies with you and/or your care-partner.  ?

## 2021-07-20 NOTE — Progress Notes (Signed)
PT taken to PACU. Monitors in place. VSS. Report given to RN. 

## 2021-07-20 NOTE — Progress Notes (Signed)
?  The recent H&P (dated 06/22/21) was reviewed, the patient was examined and there is no change in the patients condition since that H&P was completed. ? ? ?Milus Banister  07/20/2021, 2:31 PM ? ?

## 2021-07-21 NOTE — Progress Notes (Signed)
?Cardiology Office Note:   ? ?Date:  07/25/2021  ? ?ID:  Shelley Coleman, DOB [birth year], MRN [id number] ? ?PCP:  Dorothyann Peng, NP  ?Cardiologist:  Buford Dresser, MD ? ?Referring MD: Dorothyann Peng, NP  ? ?CC: Follow-up ? ?History of Present Illness:   ? ?Shelley Coleman is a 81 y.o. female with a hx of hypertension, hyperlipidemia, aortic atherosclerosis, prior CVA, history of GI bleed 10/2020 who is seen for follow-up. I initially met her 06/02/2021 as a new consult at the request of Dorothyann Peng, NP for the evaluation and management of dyspnea on exertion. ? ?CV risk factors: Has a strong family history of CAD. Father had 7 MI's (first age 52) and 2 CVA. Both brothers have stents. Mother died of angiosarcoma at age 43. ? ?Former Quarry manager. ? ?Today: ?Since her last visit she had a cardiac/coronary CT 06/16/21, showing a coronary calcium score of 440. This was 80th percentile for age, sex, and race matched control. Also found an aortic valve calcium score of 153. There was concern for a significant stenosis in the mRCA, and this was borderline on FFR. We discussed these results in detail today. ? ?Overall, she is feeling better. Recently saw her gastroenterologist and had several medication changes. She notes this quieted her stomach. In the past year her GI issues have been severe. ? ?Generally she feels very fatigued, beginning as soon as she gets out of bed. When she stands up she feels like "a weight is on me." ? ?Sometimes when she lies in bed at night in a straight, prone position, she notices some palpitations. ? ?For exercise, she is trying to walk as she is able. She has worked up to 1 mile. Aside from her fatigue she has no significant exertional symptoms. ? ?Years ago she was started on nitroglycerin prn. She has not needed to use this lately. Also, she is no longer taking Pepcid. When she was taking a statin previously, she developed severe body aches and myalgias. ? ?She denies any chest pain,  shortness of breath, or peripheral edema. No lightheadedness, headaches, syncope, orthopnea, or PND. ? ? ?Past Medical History:  ?Diagnosis Date  ? Anemia   ? Has had iron infusions  ? Breast cancer ([place])   ? right  ? Cerebrovascular disease 11/12/2018  ? Depression   ? Depression   ? GERD (gastroesophageal reflux disease)   ? Hiatal hernia   ? History of GI bleed   ? Hx of mitral valve prolapse   ? Hyperlipidemia   ? Irregular heart beat   ? Neuropathy   ? numbness in both feet  ? Nocturnal leg cramps 09/02/2015  ? Optic neuritis   ? left  ? Osteopenia   ? Ovarian cyst   ? Parotid tumor   ? right  ? Pelvic fracture (HCC)   ? Restless legs syndrome   ? Shingles 2000  ? Left in V1  ? Stroke [facility])   ? ? ?Past Surgical History:  ?Procedure Laterality Date  ? ABDOMINAL HYSTERECTOMY    ? APPENDECTOMY    ? BREAST LUMPECTOMY Left   ? CATARACT EXTRACTION    ? ESOPHAGEAL MANOMETRY N/A 01/13/2013  ? Procedure: ESOPHAGEAL MANOMETRY (EM);  Surgeon: Sable Feil, MD;  Location: [facility] ENDOSCOPY;  Service: Endoscopy;  Laterality: N/A;  ? ESOPHAGOGASTRODUODENOSCOPY (EGD) WITH PROPOFOL N/A 10/30/2020  ? Procedure: ESOPHAGOGASTRODUODENOSCOPY (EGD) WITH PROPOFOL;  Surgeon: Mauri Pole, MD;  Location: [facility] ENDOSCOPY;  Service: Endoscopy;  Laterality: N/A;  ?  FOREIGN BODY REMOVAL  10/30/2020  ? Procedure: FOREIGN BODY REMOVAL;  Surgeon: Mauri Pole, MD;  Location: [facility] ENDOSCOPY;  Service: Endoscopy;;  ? HERNIA REPAIR  08/2009  ? HOT HEMOSTASIS N/A 10/30/2020  ? Procedure: HOT HEMOSTASIS (ARGON PLASMA COAGULATION/BICAP);  Surgeon: Mauri Pole, MD;  Location: [facility] ENDOSCOPY;  Service: Endoscopy;  Laterality: N/A;  ? MASTECTOMY Right   ? SPINE SURGERY    ? TOTAL HIP ARTHROPLASTY Left   ? ? ?Current Medications: ?Current Outpatient Medications on File Prior to Visit  ?Medication Sig  ? acetaminophen (TYLENOL) 500 MG tablet Take 1,000 mg by mouth every 6 (six) hours as needed for moderate pain or headache.  ? atenolol (TENORMIN)  50 MG tablet Take 1 tablet (50 mg total) by mouth daily.  ? clopidogrel (PLAVIX) 75 MG tablet Take 1 tablet (75 mg total) by mouth daily.  ? ezetimibe (ZETIA) 10 MG tablet Take 1 tablet (10 mg total) by mouth daily.  ? ferrous sulfate 325 (65 FE) MG tablet Take 325 mg by mouth daily with breakfast.  ? Multiple Vitamins-Minerals (CENTRUM SILVER 50+WOMEN) TABS Take 1 tablet by mouth daily.  ? Multiple Vitamins-Minerals (PRESERVISION AREDS 2 PO) Take 1 tablet by mouth in the morning and at bedtime.  ? nitroGLYCERIN (NITROSTAT) 0.4 MG SL tablet PLACE ONE TABLET UNDER THE TONGUE EVERY 5 MINUTES AS NEEDED.  ? omeprazole (PRILOSEC) 40 MG capsule Take one capsule shortly before breakfast and dinner meal each day  ? Polyethyl Glycol-Propyl Glycol (SYSTANE OP) Place 1 drop into both eyes daily as needed (dry eyes).  ? rOPINIRole (REQUIP) 3 MG tablet Take 3 mg by mouth at bedtime as needed (rls).  ? traZODone (DESYREL) 50 MG tablet Take 0.5-1 tablets (25-50 mg total) by mouth at bedtime as needed for sleep.  ? vitamin B-12 (CYANOCOBALAMIN) 500 MCG tablet Take 500 mcg by mouth daily.  ? ?Current Facility-Administered Medications on File Prior to Visit  ?Medication  ? 0.9 %  sodium chloride infusion  ?  ? ?Allergies:   Diflunisal, Lipitor [atorvastatin], Statins, and Tetanus toxoid  ? ?Social History  ? ?Tobacco Use  ? Smoking status: Never  ? Smokeless tobacco: Never  ?Vaping Use  ? Vaping Use: Never used  ?Substance Use Topics  ? Alcohol use: Yes  ?  Alcohol/week: 1.0 standard drink  ?  Types: 1 Glasses of wine per week  ?  Comment: Rarely,socially  ? Drug use: No  ? ? ?Family History: ?family history includes Cancer in her mother; Heart attack in her father; Heart disease in her father; Hernia in her brother; Hypertension in her brother and father; Stroke in her father. There is no history of Neuropathy, Restless legs syndrome, Colon cancer, Esophageal cancer, Pancreatic cancer, or Stomach cancer.  Father had 15 MI (first age  49) and 2 CVA. Both brothers have stents. Mother died of angiosarcoma at age 25. ? ? ?ROS:   ?Please see the history of present illness. ?(+) Fatigue ?All other systems are reviewed and negative.  ? ? ?EKGs/Labs/Other Studies Reviewed:   ? ?The following studies were reviewed today: ? ?Cardiac CTA 06/16/2021: ?FINDINGS: ?Scan was triggered in the descending thoracic aorta. Axial ?non-contrast 3 mm slices were carried out through the heart. The ?data set was analyzed on a dedicated work station and scored using ?the Agatson method. Gantry rotation speed was 250 msecs and ?collimation was .6 mm. 0.8 mg of sl NTG was given. The 3D data set ?was reconstructed in 5% intervals  of the 67-82 % of the R-R cycle. ?Diastolic phases were analyzed on a dedicated work station using ?MPR, MIP and VRT modes. The patient received 95 cc of contrast. ?  ?Aorta:  Normal size.  Aortic atherosclerosis.  No dissection. ?  ?Main Pulmonary Artery: Normal size of the pulmonary artery. ?  ?Aortic Valve:  Tri-leaflet.  Aortic valve calcium score 153. ?  ?Coronary Arteries:  Normal coronary origin.  Right dominance. ?  ?Coronary Calcium Score: ?  ?Left main: 0 ?  ?Left anterior descending artery: 266 ?  ?Left circumflex artery: 0 ?  ?Right coronary artery: 174 ?  ?Total: 440 ?  ?Percentile: 80th for age, sex, and race matched control. ?  ?RCA is a large dominant artery that gives rise to PDA and PLA. Mild ?calcified plaque in the proximal RCA. Moderate soft stenosis in the ?mid RCA. Moderate mixed stenosis in the distal RCA. Mild mixed ?plaques in PDA and PLA branches. ?  ?Left main is a large artery that gives rise to LAD and LCX arteries. ?Mild soft plaque in the distal vessel. ?  ?LAD is a large vessel that gives rise to two diagonal vessels. Mild ?mixed plaque in the proximal vessel. Moderate mixed plaque in the ?mid vessel. Mild calcified D1 disease. Mild calcified D2 disease ?before vessel bifurcation. ?  ?LCX is a non-dominant artery.  There is no significant plaque. Very ?small vessel. ?  ?Other findings: ?  ?Normal pulmonary vein drainage into the left atrium. ?  ?Normal left atrial appendage without a thrombus. ?  ?Cannot exclude small P

## 2021-07-22 ENCOUNTER — Telehealth: Payer: Self-pay | Admitting: *Deleted

## 2021-07-22 NOTE — Telephone Encounter (Signed)
?  Follow up Call- ? ? ?  07/20/2021  ?  2:17 PM  ?Call back number  ?Post procedure Call Back phone  # [phone]  ?Permission to leave phone message Yes  ?  ? ?Patient questions: ? ?Do you have a fever, pain , or abdominal swelling? No. ?Pain Score  0 * ? ?Have you tolerated food without any problems? Yes.   ? ?Have you been able to return to your normal activities? Yes.   ? ?Do you have any questions about your discharge instructions: ?Diet   No. ?Medications  No. ?Follow up visit  No. ? ?Do you have questions or concerns about your Care? No. ? ?Actions: ?* If pain score is 4 or above: ?No action needed, pain <4. ? ? ?

## 2021-07-25 ENCOUNTER — Telehealth: Payer: Self-pay | Admitting: Cardiology

## 2021-07-25 ENCOUNTER — Other Ambulatory Visit: Payer: Self-pay

## 2021-07-25 ENCOUNTER — Ambulatory Visit (INDEPENDENT_AMBULATORY_CARE_PROVIDER_SITE_OTHER): Payer: Medicare Other | Admitting: Cardiology

## 2021-07-25 ENCOUNTER — Encounter (HOSPITAL_BASED_OUTPATIENT_CLINIC_OR_DEPARTMENT_OTHER): Payer: Self-pay | Admitting: Cardiology

## 2021-07-25 VITALS — BP 136/82 | HR 77 | Ht 67.0 in | Wt 175.5 lb

## 2021-07-25 DIAGNOSIS — I25118 Atherosclerotic heart disease of native coronary artery with other forms of angina pectoris: Secondary | ICD-10-CM

## 2021-07-25 DIAGNOSIS — T466X5A Adverse effect of antihyperlipidemic and antiarteriosclerotic drugs, initial encounter: Secondary | ICD-10-CM

## 2021-07-25 DIAGNOSIS — T466X5D Adverse effect of antihyperlipidemic and antiarteriosclerotic drugs, subsequent encounter: Secondary | ICD-10-CM

## 2021-07-25 DIAGNOSIS — Z712 Person consulting for explanation of examination or test findings: Secondary | ICD-10-CM | POA: Diagnosis not present

## 2021-07-25 DIAGNOSIS — M791 Myalgia, unspecified site: Secondary | ICD-10-CM

## 2021-07-25 DIAGNOSIS — Z8673 Personal history of transient ischemic attack (TIA), and cerebral infarction without residual deficits: Secondary | ICD-10-CM | POA: Diagnosis not present

## 2021-07-25 DIAGNOSIS — I10 Essential (primary) hypertension: Secondary | ICD-10-CM | POA: Diagnosis not present

## 2021-07-25 NOTE — Patient Instructions (Signed)
Medication Instructions:  ?Please take Nexletol 180 mg (samples)  daily for 2 weeks. If you are able to tolerate, call our office and we will send in a prescription. ? ? ?*If you need a refill on your cardiac medications before your next appointment, please call your pharmacy* ? ? ?Lab Work: ?None ordered today ? ? ?Testing/Procedures: ?None ordered today ? ? ?Follow-Up: ?At [facility], you and your health needs are our priority.  As part of our continuing mission to provide you with exceptional heart care, we have created designated Provider Care Teams.  These Care Teams include your primary Cardiologist (physician) and Advanced Practice Providers (APPs -  Physician Assistants and Nurse Practitioners) who all work together to provide you with the care you need, when you need it. ? ?We recommend signing up for the patient portal called "MyChart".  Sign up information is provided on this After Visit Summary.  MyChart is used to connect with patients for Virtual Visits (Telemedicine).  Patients are able to view lab/test results, encounter notes, upcoming appointments, etc.  Non-urgent messages can be sent to your provider as well.   ?To learn more about what you can do with MyChart, go to [email or web address].   ? ?Your next appointment:   ?6 month(s) ? ?The format for your next appointment:   ?In Person ? ?Provider:   ?Buford Dresser, MD{ ? ?

## 2021-07-25 NOTE — Telephone Encounter (Signed)
RN  returned call to patient to review medications. P[atient seen today and was given Nexletol samples. Patient is to trial Nexletol for 2 weeks to hopefully transition to [place].  ?  ? ? ?

## 2021-07-25 NOTE — Telephone Encounter (Signed)
Patient would like to speak to Dr. Judeth Cornfield nurse to go over her medication list. Please advise.  ?

## 2021-07-26 ENCOUNTER — Telehealth: Payer: Self-pay | Admitting: Pharmacist

## 2021-07-26 NOTE — Chronic Care Management (AMB) (Signed)
? ? ?  Chronic Care Management ?Pharmacy Assistant  ? ?Name: Shelley Coleman  MRN: [id number] DOB: [birth year] ?07/26/21 APPOINTMENT REMINDER ? ? ? ?Patient was reminded to have all medications, supplements and any blood glucose and blood pressure readings available for review with [name], Pharm. D, for telephone visit on 07/27/21 at 11. ? ? ? ?Care Gaps: ?BP- 103/78 ( 06/22/21) ?AWV- 9/22 ?Zoster Vaccine - Overdue ?TDAP - Overdue ?  ?Star Rating Drugs: ?None ? ? ? ? ? ?Medications: ?Outpatient Encounter Medications as of 07/26/2021  ?Medication Sig  ? acetaminophen (TYLENOL) 500 MG tablet Take 1,000 mg by mouth every 6 (six) hours as needed for moderate pain or headache.  ? atenolol (TENORMIN) 50 MG tablet Take 1 tablet (50 mg total) by mouth daily.  ? clopidogrel (PLAVIX) 75 MG tablet Take 1 tablet (75 mg total) by mouth daily.  ? ezetimibe (ZETIA) 10 MG tablet Take 1 tablet (10 mg total) by mouth daily.  ? ferrous sulfate 325 (65 FE) MG tablet Take 325 mg by mouth daily with breakfast.  ? Multiple Vitamins-Minerals (CENTRUM SILVER 50+WOMEN) TABS Take 1 tablet by mouth daily.  ? Multiple Vitamins-Minerals (PRESERVISION AREDS 2 PO) Take 1 tablet by mouth in the morning and at bedtime.  ? nitroGLYCERIN (NITROSTAT) 0.4 MG SL tablet PLACE ONE TABLET UNDER THE TONGUE EVERY 5 MINUTES AS NEEDED.  ? omeprazole (PRILOSEC) 40 MG capsule Take one capsule shortly before breakfast and dinner meal each day  ? Polyethyl Glycol-Propyl Glycol (SYSTANE OP) Place 1 drop into both eyes daily as needed (dry eyes).  ? rOPINIRole (REQUIP) 3 MG tablet Take 3 mg by mouth at bedtime as needed (rls).  ? traZODone (DESYREL) 50 MG tablet Take 0.5-1 tablets (25-50 mg total) by mouth at bedtime as needed for sleep.  ? vitamin B-12 (CYANOCOBALAMIN) 500 MCG tablet Take 500 mcg by mouth daily.  ? ?Facility-Administered Encounter Medications as of 07/26/2021  ?Medication  ? 0.9 %  sodium chloride infusion  ? ? ? ?Ned Clines CMA ?Clinical Pharmacist  Assistant ?[phone] ? ?

## 2021-07-26 NOTE — Progress Notes (Deleted)
? ?Chronic Care Management ?Pharmacy Note ? ?07/26/2021 ?Name:  Shelley Coleman MRN:  [id number] DOB:  Oct 17, 1940 ? ?Summary: ?*** ? ?Recommendations/Changes made from today's visit: ?*** ? ?Plan: ?*** ? ? ?Subjective: ?Shelley Coleman is an 81 y.o. year old female who is a primary patient of Nafziger, Tommi Rumps, NP.  The CCM team was consulted for assistance with disease management and care coordination needs.   ? ?Engaged with patient by telephone for initial visit in response to provider referral for pharmacy case management and/or care coordination services.  ? ?Consent to Services:  ?The patient was given the following information about Chronic Care Management services today, agreed to services, and gave verbal consent: 1. CCM service includes personalized support from designated clinical staff supervised by the primary care provider, including individualized plan of care and coordination with other care providers 2. 24/7 contact phone numbers for assistance for urgent and routine care needs. 3. Service will only be billed when office clinical staff spend 20 minutes or more in a month to coordinate care. 4. Only one practitioner may furnish and bill the service in a calendar month. 5.The patient may stop CCM services at any time (effective at the end of the month) by phone call to the office staff. 6. The patient will be responsible for cost sharing (co-pay) of up to 20% of the service fee (after annual deductible is met). Patient agreed to services and consent obtained. ? ?Patient Care Team: ?Dorothyann Peng, NP as PCP - General (Family Medicine) ?Buford Dresser, MD as PCP - Cardiology (Cardiology) ?Kathie Rhodes, MD (Inactive) as Consulting Physician (Urology) ?Viona Gilmore, [facility] as Pharmacist (Pharmacist) ? ?Recent office visits: ?06/15/21 Dorothyann Peng, NP - Patient presented for essential hypertension and other concerns. Prescribed Ezetimibe 10 mg & Trazodone 25-50 mg ?  ?05/11/21 Dorothyann Peng, NP -  Patient presented for Essential hypertension and other concerns. Increased Atenolol to 50 mg daily. ?  ?01/17/21 Randel Pigg, LPN - Patient presented for Medicare Annual Wellness Exam. No medication changes. ? ?Recent consult visits: ?07/25/21 Cardiology telephone encounter: Prescribed Nexletol with samples. Provided 2 weeks of samples and plan to transition to Nexlizet after. ? ?07/25/21 Buford Dresser, MD (cardiology): Patient presented for CAD follow up.  ? ?06/22/21 Milus Banister, MD Gertie [facility]ey) - Patient presented for Iron deficiency anemia due to chronic blood loss and other concerns. Prescribed Famotidine 20 mg, Omeprazole 40 mg. Changed Ferrous Sulfate to 325 mg. Stopped Alprazolam and Pantoprazole. ?  ?06/02/21 Buford Dresser, MD (Cardiology) - Patient presented for dyspnea on exertion and other concerns. Changed Alprazolam to 0.5 mg PRN ?  ?05/05/21 Rella Larve, MD (Ophthalmology) - Patient presented for Avastin injection. No medication changes noted. ?  ?04/18/21 Ward Givens, NP (Neurology) - Patient presented for Chronic bilateral low back pain with right sided sciatica and other concerns. No medication changes. ? ?Hospital visits: ?None in previous 6 months ? ? ?Objective: ? ?Lab Results  ?Component Value Date  ? CREATININE 0.70 06/15/2021  ? BUN 25 (H) 06/15/2021  ? GFR 82.53 06/15/2021  ? GFRNONAA >60 10/31/2020  ? GFRAA >60 11/02/2015  ? NA 139 06/15/2021  ? K 4.0 06/15/2021  ? CALCIUM 9.6 06/15/2021  ? CO2 28 06/15/2021  ? GLUCOSE 78 06/15/2021  ? ? ?Lab Results  ?Component Value Date/Time  ? HGBA1C 5.3 11/01/2015 05:08 AM  ? GFR 82.53 06/15/2021 10:13 AM  ? GFR 85.04 05/11/2021 02:53 PM  ?  ?Last diabetic Eye exam: No results found  for: HMDIABEYEEXA  ?Last diabetic Foot exam: No results found for: HMDIABFOOTEX  ? ?Lab Results  ?Component Value Date  ? CHOL 263 (H) 06/15/2021  ? HDL 60.70 06/15/2021  ? LDLCALC 158 (H) 08/28/2019  ? LDLDIRECT 185.0 06/15/2021  ? TRIG 211.0 (H)  06/15/2021  ? CHOLHDL 4 06/15/2021  ? ? ? ?  Latest Ref Rng & Units 06/15/2021  ? 10:13 AM 10/31/2020  ?  2:05 AM 10/30/2020  ?  1:12 AM  ?Hepatic Function  ?Total Protein 6.0 - 8.3 g/dL 7.5   5.5   5.4    ?Albumin 3.5 - 5.2 g/dL 4.1   3.1   3.1    ?AST 0 - 37 U/L 16   19   19     ?ALT 0 - 35 U/L 12   13   13     ?Alk Phosphatase 39 - 117 U/L 76   62   64    ?Total Bilirubin 0.2 - 1.2 mg/dL 0.5   0.5   0.9    ? ? ?Lab Results  ?Component Value Date/Time  ? TSH 1.45 06/15/2021 10:13 AM  ? TSH 1.33 05/11/2021 02:53 PM  ? FREET4 0.77 11/04/2015 02:25 PM  ? ? ? ?  Latest Ref Rng & Units 06/15/2021  ? 10:13 AM 03/14/2021  ?  2:11 PM 12/06/2020  ? 12:11 PM  ?CBC  ?WBC 4.0 - 10.5 K/uL 4.7   5.3   5.9    ?Hemoglobin 12.0 - 15.0 g/dL 13.0   13.8   12.1    ?Hematocrit 36.0 - 46.0 % 40.1   42.1   38.1    ?Platelets 150.0 - 400.0 K/uL 233.0   220.0   242.0    ? ? ?Lab Results  ?Component Value Date/Time  ? VD25OH 17 (L) 02/25/2009 09:33 PM  ? ? ?Clinical ASCVD: Yes  ?The ASCVD Risk score (Arnett DK, et al., 2019) failed to calculate for the following reasons: ?  The patient has a prior MI or stroke diagnosis   ? ? ?  06/15/2021  ?  9:34 AM 01/17/2021  ?  1:28 PM 01/17/2021  ?  1:20 PM  ?Depression screen PHQ 2/9  ?Decreased Interest 1 0 0  ?Down, Depressed, Hopeless 0 0 0  ?PHQ - 2 Score 1 0 0  ?Altered sleeping 3    ?Tired, decreased energy 3    ?Change in appetite 1    ?Feeling bad or failure about yourself  1    ?Trouble concentrating 0    ?Moving slowly or fidgety/restless 0    ?Suicidal thoughts 0    ?PHQ-9 Score 9    ?Difficult doing work/chores Not difficult at all    ?  ? ?***Other: (CHADS2VASc if Afib, MMRC or CAT for COPD, ACT, DEXA) ? ?Social History  ? ?Tobacco Use  ?Smoking Status Never  ?Smokeless Tobacco Never  ? ?BP Readings from Last 3 Encounters:  ?07/25/21 136/82  ?07/20/21 102/69  ?06/22/21 130/78  ? ?Pulse Readings from Last 3 Encounters:  ?07/25/21 77  ?07/20/21 67  ?06/22/21 66  ? ?Wt Readings from Last 3 Encounters:   ?07/25/21 175 lb 8 oz (79.6 kg)  ?07/20/21 178 lb (80.7 kg)  ?06/22/21 178 lb 2 oz (80.8 kg)  ? ?BMI Readings from Last 3 Encounters:  ?07/25/21 27.49 kg/m?  ?07/20/21 29.62 kg/m?  ?06/22/21 29.19 kg/m?  ? ? ?Assessment/Interventions: Review of patient past medical history, allergies, medications, health status, including review of consultants reports, laboratory  and other test data, was performed as part of comprehensive evaluation and provision of chronic care management services.  ? ?SDOH:  (Social Determinants of Health) assessments and interventions performed: {yes/no:20286} ? ?SDOH Screenings  ? ?Alcohol Screen: Low Risk   ? Last Alcohol Screening Score (AUDIT): 0  ?Depression (PHQ2-9): Medium Risk  ? PHQ-2 Score: 9  ?Financial Resource Strain: Low Risk   ? Difficulty of Paying Living Expenses: Not hard at all  ?Food Insecurity: No Food Insecurity  ? Worried About Charity fundraiser in the Last Year: Never true  ? Ran Out of Food in the Last Year: Never true  ?Housing: Low Risk   ? Last Housing Risk Score: 0  ?Physical Activity: Inactive  ? Days of Exercise per Week: 0 days  ? Minutes of Exercise per Session: 0 min  ?Social Connections: Moderately Isolated  ? Frequency of Communication with Friends and Family: Three times a week  ? Frequency of Social Gatherings with Friends and Family: Three times a week  ? Attends Religious Services: More than 4 times per year  ? Active Member of Clubs or Organizations: No  ? Attends Archivist Meetings: Never  ? Marital Status: Widowed  ?Stress: No Stress Concern Present  ? Feeling of Stress : Only a little  ?Tobacco Use: Low Risk   ? Smoking Tobacco Use: Never  ? Smokeless Tobacco Use: Never  ? Passive Exposure: Not on file  ?Transportation Needs: No Transportation Needs  ? Lack of Transportation (Medical): No  ? Lack of Transportation (Non-Medical): No  ? ?Patient reports she does not think the Trazodone works as well as it could. She reports when using half  tab she still has trouble falling asleep and with the whole tab she feels funny in her head. She reports she has recently started this medication. ? ?Patient reports she is often extremely tired and slow mov

## 2021-07-27 ENCOUNTER — Ambulatory Visit (INDEPENDENT_AMBULATORY_CARE_PROVIDER_SITE_OTHER): Payer: Medicare Other | Admitting: Pharmacist

## 2021-07-27 ENCOUNTER — Other Ambulatory Visit: Payer: Self-pay | Admitting: Adult Health

## 2021-07-27 DIAGNOSIS — I10 Essential (primary) hypertension: Secondary | ICD-10-CM

## 2021-07-27 DIAGNOSIS — E559 Vitamin D deficiency, unspecified: Secondary | ICD-10-CM

## 2021-07-27 DIAGNOSIS — E782 Mixed hyperlipidemia: Secondary | ICD-10-CM

## 2021-07-27 NOTE — Patient Instructions (Signed)
Hi Sherial, ? ?It was great to get to meet you over the telephone! Below is a summary of some of the topics we discussed.  ? ?Make sure to get at least 2 servings of dairy in every day in order to make sure you are getting the right amount of calcium. ? ?Please reach out to me if you have any questions or need anything before our follow up! ? ?Best, ?Maddie ? ?[name], PharmD, BCACP ?Clinical Pharmacist ?Therapist, music at [place] ?[phone] ? ? Visit Information ? ? Goals Addressed   ?None ?  ? ?Patient Care Plan: [place]  ?  ? ?Problem Identified: Problem: Hypertension, Hyperlipidemia, GERD, and Restless legs syndrome, Insomnia, History of strokes   ?  ? ?Long-Range Goal: Patient-Specific Goal   ?Start Date: 07/27/2021  ?Expected End Date: 07/28/2022  ?This Visit's Progress: On track  ?Priority: High  ?Note:   ?Current Barriers:  ?Unable to independently monitor therapeutic efficacy ?Unable to achieve control of cholesterol  ? ?Pharmacist Clinical Goal(s):  ?Patient will achieve adherence to monitoring guidelines and medication adherence to achieve therapeutic efficacy ?achieve control of cholesterol as evidenced by next lipid panel  through collaboration with PharmD and provider.  ? ?Interventions: ?1:1 collaboration with Dorothyann Peng, NP regarding development and update of comprehensive plan of care as evidenced by provider attestation and co-signature ?Inter-disciplinary care team collaboration (see longitudinal plan of care) ?Comprehensive medication review performed; medication list updated in electronic medical record ? ?Hypertension (BP goal <140/90) ?-Controlled ?-Current treatment: ?Atenolol 50 mg 1 tablet daily - Appropriate, Effective, Safe, Accessible ?-Medications previously tried: none  ?-Current home readings: 120-140/70-80s (checking a few times a week; arm cuff) - has brought it into check it against other ?-Current dietary habits: doesn't use too much ?-Current  exercise habits: minimal ?-Denies hypotensive/hypertensive symptoms ?-Educated on BP goals and benefits of medications for prevention of heart attack, stroke and kidney damage; ?Importance of home blood pressure monitoring; ?Proper BP monitoring technique; ?-Counseled to monitor BP at home weekly, document, and provide log at future appointments ?-Counseled on diet and exercise extensively ?Recommended to continue current medication ? ?Hyperlipidemia: (LDL goal < 70) ?-Uncontrolled ?-Current treatment: ?Ezetimibe 10 mg 1 tablet daily - Appropriate, Query effective, Safe, Accessible ?Bempedoic acid 180 mg 1 tablet daily - Appropriate, Query effective, Safe, Accessible ?-Medications previously tried: statins (joint pain)  ?-Current dietary patterns: doesn't eat red meat or fried foods; does eat cheese; doesn't eat a lot of fruits and vegetables  ?-Current exercise habits: walking some  ?-Educated on Cholesterol goals;  ?Importance of limiting foods high in cholesterol; ?Exercise goal of 150 minutes per week; ?-Counseled on diet and exercise extensively ?Recommended to continue current medication ? ?History of multiple strokes (Goal: prevent stroke) ?-Controlled ?-Current treatment  ?Clopidogrel 75 mg 1 tablet daily - Appropriate, Effective, Safe, Accessible ?-Medications previously tried: none  ?-Recommended to continue current medication ?Counseled on monitoring for signs of bleeding such as unexplained and excessive bleeding from a cut or injury, easy or excessive bruising, blood in urine or stools, and nosebleeds without a known cause. ? ?Restless legs syndrome (Goal: minimize symptoms) ?-Uncontrolled ?-Current treatment: ?Ropinirole 3 mg 1 tablet at bedtime as needed (just taking one of the 1 mg tablets) - Appropriate, Query effective, Query Safe, Accessible ?-Medications previously tried: none  ?-Collaborated with neurology to consider pramipexole. ? ?Insomnia (Goal: improve quality and quantity of  sleep) ?-Uncontrolled ?-Current treatment  ?Trazodone 50 mg 1/2-1 tablet at bedtime - Appropriate, Effective,  Safe, Accessible ?-Medications previously tried: melatonin  ?-Recommended to continue current medication ?Recommended practicing good sleep hygiene by setting a sleep schedule and maintaining it, avoid excessive napping, following a nightly routine, avoiding screen time for 30-60 minutes before going to bed, and making the bedroom a cool, quiet and dark space. ? ?Osteoporosis (Goal prevent fractures) ?-Uncontrolled ?-Last DEXA Scan: 2021  ? T-Score femoral neck: -2.4 ? T-Score total hip: n/a ? T-Score lumbar spine: n/a ? T-Score forearm radius: -3.2 ? 10-year probability of major osteoporotic fracture: n/a  ? 10-year probability of hip fracture: n/a ?-Patient is a candidate for pharmacologic treatment due to T-Score < -2.5 in femoral neck ?-Current treatment  ?Multivitamin 1 tablet daily (300 mg of Calcium, vitamin D 1000 units)  - Appropriate, Query effective, Safe, Accessible ?-Medications previously tried: none  ?-Recommend [phone] units of vitamin D daily. Recommend 1200 mg of calcium daily from dietary and supplemental sources. Recommend weight-bearing and muscle strengthening exercises for building and maintaining bone density. ?-Counseled on getting at least 2 servings of dairy per day to get the right amount of calcium between diet and supplements. ?Recommended repeat vitamin D level and starting Prolia. ? ? ?GERD (Goal: minimize symptoms) ?-Controlled ?-Current treatment: ?Omeprazole 40 mg 1 capsule twice daily - Appropriate, Effective, Query Safe, Accessible ?Famotidine 20 mg 1 tablet once daily - not taking ?-Medications previously tried: Dexilant (cost), pantoprazole (ineffective) ?-Recommended to continue current medication ?Counseled on non-pharmacologic management of symptoms such as elevating the head of your bed, avoiding eating 2-3 hours before bed, avoiding triggering foods such as  acidic, spicy, or fatty foods, eating smaller meals, and wearing clothes that are loose around the waist. ?-Recommended switching PPI to alternative to avoid drug interaction with clopidogrel. ? ?Health Maintenance ?-Vaccine gaps: shingrix, tetanus ?-Current therapy:  ?Multivitamin 1 tablet daily ?Preservision twice daily ?Nitroglycerin 0.4 mg SL tablet as needed ?Systane eye drops as needed ?Vitamin B12 3000 mcg 1 tablet twice daily ?Acetaminophen 500 mg 1 tablet as needed ?Ferrous sulfate 325 mg 1 tablet once daily ?-Educated on Herbal supplement research is limited and benefits usually cannot be proven ?Cost vs benefit of each product must be carefully weighed by individual consumer ?-Patient is satisfied with current therapy and denies issues ?-Recommended to continue current medication ? ?Patient Goals/Self-Care Activities ?Patient will:  ?- take medications as prescribed as evidenced by patient report and record review ?check blood pressure weekly, document, and provide at future appointments ? ?Follow Up Plan: The care management team will reach out to the patient again over the next 7 days.  ? ?  ? ? ?Ms. Poppell was given information about Chronic Care Management services today including:  ?CCM service includes personalized support from designated clinical staff supervised by her physician, including individualized plan of care and coordination with other care providers ?24/7 contact phone numbers for assistance for urgent and routine care needs. ?Standard insurance, coinsurance, copays and deductibles apply for chronic care management only during months in which we provide at least 20 minutes of these services. Most insurances cover these services at 100%, however patients may be responsible for any copay, coinsurance and/or deductible if applicable. This service may help you avoid the need for more expensive face-to-face services. ?Only one practitioner may furnish and bill the service in a calendar  month. ?The patient may stop CCM services at any time (effective at the end of the month) by phone call to the office staff. ? ?Patient agreed to services and verbal consent obtained.  ? ?  Patient verbalizes understanding of instructi

## 2021-07-27 NOTE — Progress Notes (Signed)
? ?Chronic Care Management ?Pharmacy Note ? ?07/27/2021 ?Name:  Shelley Coleman MRN:  [id number] DOB:  1941-01-14 ? ?Summary: ?BP mostly at goal < 140/90 ?LDL not at goal < 70 ? ?Recommendations/Changes made from today's visit: ?-Recommend repeat lipid panel in 2-3 months ?-Recommend switching omeprazole to alternative PPI due to drug interaction with clopidogrel ?-Consider switching ropinirole to pramipexole due to lack of efficacy and side effects ?-Recommend repeat vitamin D level ?-Recommend starting Prolia ? ? ?Plan: ?Review repeat lipid panel ?Follow up after discussion with PCP ? ? ?Subjective: ?Shelley Coleman is an 81 y.o. year old female who is a primary patient of Nafziger, Tommi Rumps, NP.  The CCM team was consulted for assistance with disease management and care coordination needs.   ? ?Engaged with patient by telephone for initial visit in response to provider referral for pharmacy case management and/or care coordination services.  ? ?Consent to Services:  ?The patient was given the following information about Chronic Care Management services today, agreed to services, and gave verbal consent: 1. CCM service includes personalized support from designated clinical staff supervised by the primary care provider, including individualized plan of care and coordination with other care providers 2. 24/7 contact phone numbers for assistance for urgent and routine care needs. 3. Service will only be billed when office clinical staff spend 20 minutes or more in a month to coordinate care. 4. Only one practitioner may furnish and bill the service in a calendar month. 5.The patient may stop CCM services at any time (effective at the end of the month) by phone call to the office staff. 6. The patient will be responsible for cost sharing (co-pay) of up to 20% of the service fee (after annual deductible is met). Patient agreed to services and consent obtained. ? ?Patient Care Team: ?Dorothyann Peng, NP as PCP - General (Family  Medicine) ?Buford Dresser, MD as PCP - Cardiology (Cardiology) ?Kathie Rhodes, MD (Inactive) as Consulting Physician (Urology) ?Viona Gilmore, [facility] as Pharmacist (Pharmacist) ? ?Recent office visits: ?06/15/21 Dorothyann Peng, NP - Patient presented for essential hypertension and other concerns. Prescribed Ezetimibe 10 mg & Trazodone 25-50 mg ?  ?05/11/21 Dorothyann Peng, NP - Patient presented for Essential hypertension and other concerns. Increased Atenolol to 50 mg daily. ?  ?01/17/21 Randel Pigg, LPN - Patient presented for Medicare Annual Wellness Exam. No medication changes. ? ?Recent consult visits: ?07/25/21 Cardiology telephone encounter: Prescribed Nexletol with samples. Provided 2 weeks of samples and plan to transition to Nexlizet after. ? ?07/25/21 Buford Dresser, MD (cardiology): Patient presented for CAD follow up.  ? ?06/22/21 Milus Banister, MD Gertie Fey) - Patient presented for Iron deficiency anemia due to chronic blood loss and other concerns. Prescribed Famotidine 20 mg, Omeprazole 40 mg. Changed Ferrous Sulfate to 325 mg. Stopped Alprazolam and Pantoprazole. ?  ?06/02/21 Buford Dresser, MD (Cardiology) - Patient presented for dyspnea on exertion and other concerns. Changed Alprazolam to 0.5 mg PRN ?  ?05/05/21 Rella Larve, MD (Ophthalmology) - Patient presented for Avastin injection. No medication changes noted. ?  ?04/18/21 Ward Givens, NP (Neurology) - Patient presented for Chronic bilateral low back pain with right sided sciatica and other concerns. No medication changes. ? ?Hospital visits: ?None in previous 6 months ? ? ?Objective: ? ?Lab Results  ?Component Value Date  ? CREATININE 0.70 06/15/2021  ? BUN 25 (H) 06/15/2021  ? GFR 82.53 06/15/2021  ? GFRNONAA >60 10/31/2020  ? GFRAA >60 11/02/2015  ? NA 139 06/15/2021  ?  K 4.0 06/15/2021  ? CALCIUM 9.6 06/15/2021  ? CO2 28 06/15/2021  ? GLUCOSE 78 06/15/2021  ? ? ?Lab Results  ?Component Value Date/Time  ?  HGBA1C 5.3 11/01/2015 05:08 AM  ? GFR 82.53 06/15/2021 10:13 AM  ? GFR 85.04 05/11/2021 02:53 PM  ?  ?Last diabetic Eye exam: No results found for: HMDIABEYEEXA  ?Last diabetic Foot exam: No results found for: HMDIABFOOTEX  ? ?Lab Results  ?Component Value Date  ? CHOL 263 (H) 06/15/2021  ? HDL 60.70 06/15/2021  ? LDLCALC 158 (H) 08/28/2019  ? LDLDIRECT 185.0 06/15/2021  ? TRIG 211.0 (H) 06/15/2021  ? CHOLHDL 4 06/15/2021  ? ? ? ?  Latest Ref Rng & Units 06/15/2021  ? 10:13 AM 10/31/2020  ?  2:05 AM 10/30/2020  ?  1:12 AM  ?Hepatic Function  ?Total Protein 6.0 - 8.3 g/dL 7.5   5.5   5.4    ?Albumin 3.5 - 5.2 g/dL 4.1   3.1   3.1    ?AST 0 - 37 U/L 16   19   19     ?ALT 0 - 35 U/L 12   13   13     ?Alk Phosphatase 39 - 117 U/L 76   62   64    ?Total Bilirubin 0.2 - 1.2 mg/dL 0.5   0.5   0.9    ? ? ?Lab Results  ?Component Value Date/Time  ? TSH 1.45 06/15/2021 10:13 AM  ? TSH 1.33 05/11/2021 02:53 PM  ? FREET4 0.77 11/04/2015 02:25 PM  ? ? ? ?  Latest Ref Rng & Units 06/15/2021  ? 10:13 AM 03/14/2021  ?  2:11 PM 12/06/2020  ? 12:11 PM  ?CBC  ?WBC 4.0 - 10.5 K/uL 4.7   5.3   5.9    ?Hemoglobin 12.0 - 15.0 g/dL 13.0   13.8   12.1    ?Hematocrit 36.0 - 46.0 % 40.1   42.1   38.1    ?Platelets 150.0 - 400.0 K/uL 233.0   220.0   242.0    ? ? ?Lab Results  ?Component Value Date/Time  ? VD25OH 17 (L) 02/25/2009 09:33 PM  ? ? ?Clinical ASCVD: Yes  ?The ASCVD Risk score (Arnett DK, et al., 2019) failed to calculate for the following reasons: ?  The patient has a prior MI or stroke diagnosis   ? ? ?  06/15/2021  ?  9:34 AM 01/17/2021  ?  1:28 PM 01/17/2021  ?  1:20 PM  ?Depression screen PHQ 2/9  ?Decreased Interest 1 0 0  ?Down, Depressed, Hopeless 0 0 0  ?PHQ - 2 Score 1 0 0  ?Altered sleeping 3    ?Tired, decreased energy 3    ?Change in appetite 1    ?Feeling bad or failure about yourself  1    ?Trouble concentrating 0    ?Moving slowly or fidgety/restless 0    ?Suicidal thoughts 0    ?PHQ-9 Score 9    ?Difficult doing work/chores Not  difficult at all    ?  ? ? ?Social History  ? ?Tobacco Use  ?Smoking Status Never  ?Smokeless Tobacco Never  ? ?BP Readings from Last 3 Encounters:  ?07/25/21 136/82  ?07/20/21 102/69  ?06/22/21 130/78  ? ?Pulse Readings from Last 3 Encounters:  ?07/25/21 77  ?07/20/21 67  ?06/22/21 66  ? ?Wt Readings from Last 3 Encounters:  ?07/25/21 175 lb 8 oz (79.6 kg)  ?07/20/21 178 lb (80.7 kg)  ?  06/22/21 178 lb 2 oz (80.8 kg)  ? ?BMI Readings from Last 3 Encounters:  ?07/25/21 27.49 kg/m?  ?07/20/21 29.62 kg/m?  ?06/22/21 29.19 kg/m?  ? ? ?Assessment/Interventions: Review of patient past medical history, allergies, medications, health status, including review of consultants reports, laboratory and other test data, was performed as part of comprehensive evaluation and provision of chronic care management services.  ? ?SDOH:  (Social Determinants of Health) assessments and interventions performed: Yes ?SDOH Interventions   ? ?Flowsheet Row Most Recent Value  ?SDOH Interventions   ?Financial Strain Interventions Intervention Not Indicated  ?Transportation Interventions Intervention Not Indicated  ? ?  ? ?SDOH Screenings  ? ?Alcohol Screen: Low Risk   ? Last Alcohol Screening Score (AUDIT): 0  ?Depression (PHQ2-9): Medium Risk  ? PHQ-2 Score: 9  ?Financial Resource Strain: Low Risk   ? Difficulty of Paying Living Expenses: Not very hard  ?Food Insecurity: No Food Insecurity  ? Worried About Charity fundraiser in the Last Year: Never true  ? Ran Out of Food in the Last Year: Never true  ?Housing: Low Risk   ? Last Housing Risk Score: 0  ?Physical Activity: Inactive  ? Days of Exercise per Week: 0 days  ? Minutes of Exercise per Session: 0 min  ?Social Connections: Moderately Isolated  ? Frequency of Communication with Friends and Family: Three times a week  ? Frequency of Social Gatherings with Friends and Family: Three times a week  ? Attends Religious Services: More than 4 times per year  ? Active Member of Clubs or  Organizations: No  ? Attends Archivist Meetings: Never  ? Marital Status: Widowed  ?Stress: No Stress Concern Present  ? Feeling of Stress : Only a little  ?Tobacco Use: Low Risk   ? Smoking Tobacco Use: Nev

## 2021-07-29 ENCOUNTER — Telehealth: Payer: Self-pay

## 2021-07-29 DIAGNOSIS — I10 Essential (primary) hypertension: Secondary | ICD-10-CM | POA: Diagnosis not present

## 2021-07-29 DIAGNOSIS — E782 Mixed hyperlipidemia: Secondary | ICD-10-CM | POA: Diagnosis not present

## 2021-07-29 MED ORDER — PANTOPRAZOLE SODIUM 40 MG PO TBEC: 40.0000 mg | DELAYED_RELEASE_TABLET | Freq: Two times a day (BID) | ORAL | 11 refills | Status: DC

## 2021-07-29 NOTE — Telephone Encounter (Signed)
I have sent the prescription to the pt pharmacy.  Will you be contacting the pt? If not, I can call her and let her know.   ?

## 2021-07-29 NOTE — Telephone Encounter (Signed)
Called patient to make her aware of the change from omeprazole to pantoprazole to avoid the drug interaction with Plavix. Pt already took omeprazole this morning but will switch to pantoprazole this evening after she gets it from the pharmacy.  ?

## 2021-07-29 NOTE — Telephone Encounter (Signed)
-----   Message from Milus Banister, MD sent at 07/29/2021 10:53 AM EDT ----- ?Regarding: RE: PPI and Plavix drug interaction ?Got it, will do. ? ? ?Shelley Coleman, ?See below. Pharmacy is recommending we change her PPI.  CAn you contact her and let her know about their recommendations, lets call in pantoprazole '40mg'$  pills, one pill BID before meals, disp 1 month, 1 year of refills.  Thanks ? ? ?----- Message ----- ?From: Viona Gilmore, [facility] ?Sent: 07/28/2021   4:41 PM EDT ?To: Milus Banister, MD ?Subject: RE: PPI and Plavix drug interaction           ? ?Thank you for the prompt response! Is there any way you or someone in your office can send this in? I am unable to unfortunately. If not, I can ask her PCP. ? ?Let me know either way! ?Thanks again, ?Maddie ?----- Message ----- ?From: Milus Banister, MD ?Sent: 07/28/2021   8:04 AM EDT ?To: Viona Gilmore, RPH ?Subject: RE: PPI and Plavix drug interaction           ? ?Got it, thanks. ? ?Ok to change her to pantoprazole '40mg'$  pills, one pill shortly before BF and dinner meals, disp 1 month with one year of refills.  Thanks ? ? ?----- Message ----- ?From: Viona Gilmore, [facility] ?Sent: 07/27/2021   2:47 PM EDT ?To: Milus Banister, MD ?Subject: PPI and Plavix drug interaction               ? ?Hi, ? ?I am a pharmacist working in [name]' PCP's office and went through her medications with her today. I know she is currently having a lot of success with the omeprazole but it's not recommended to be used with the Plavix that she is already on as it can decrease the efficacy of Plavix. I know she has tried other PPIs before that do not have that risk for drug interaction (pantoprazole and Dexilant) so it makes it more tricky. She said she only ever tried pantoprazole once a day so I wasn't sure if trying BID with famotidine would be helpful or not? I saw the Dexilant was stopped due to cost. She isn't taking famotidine right now and never started it if this plays into your  recommendation. ? ?Please let me know what you think! ?Best, ?Maddie ? ?[name], PharmD, BCACP ?Clinical Pharmacist ?Therapist, music at [place] ?[phone] ? ? ? ? ? ? ?

## 2021-08-04 DIAGNOSIS — Z853 Personal history of malignant neoplasm of breast: Secondary | ICD-10-CM | POA: Diagnosis not present

## 2021-08-04 DIAGNOSIS — H353112 Nonexudative age-related macular degeneration, right eye, intermediate dry stage: Secondary | ICD-10-CM | POA: Diagnosis not present

## 2021-08-04 DIAGNOSIS — H43813 Vitreous degeneration, bilateral: Secondary | ICD-10-CM | POA: Diagnosis not present

## 2021-08-04 DIAGNOSIS — Z961 Presence of intraocular lens: Secondary | ICD-10-CM | POA: Diagnosis not present

## 2021-08-04 DIAGNOSIS — S0512XD Contusion of eyeball and orbital tissues, left eye, subsequent encounter: Secondary | ICD-10-CM | POA: Diagnosis not present

## 2021-08-04 DIAGNOSIS — H353221 Exudative age-related macular degeneration, left eye, with active choroidal neovascularization: Secondary | ICD-10-CM | POA: Diagnosis not present

## 2021-08-16 NOTE — Telephone Encounter (Signed)
If tolerating Nexletol without difficulty - please send Rx for Nexlizet one tablet daily. She will stop Zetia and Bempedoic acid tablets as the Nexlizet is these together in a combination tablet.  ? ?Loel Dubonnet, NP  ?

## 2021-08-16 NOTE — Telephone Encounter (Signed)
Patient called because she has finished the Nexletol samples and is not sure what the next step is  ?

## 2021-08-17 MED ORDER — NEXLIZET 180-10 MG PO TABS
ORAL_TABLET | ORAL | 3 refills | Status: DC
Start: 2021-08-17 — End: 2022-03-29

## 2021-08-17 NOTE — Addendum Note (Signed)
Addended by: Gerald Stabs on: 08/17/2021 09:01 AM ? ? Modules accepted: Orders ? ?

## 2021-08-17 NOTE — Telephone Encounter (Signed)
RN called patient and she tolerated the Nexletol well, RN to call in prescription for patient ? ? ? ? ?"If tolerating Nexletol without difficulty - please send Rx for Nexlizet one tablet daily. She will stop Zetia and Bempedoic acid tablets as the Nexlizet is these together in a combination tablet.  ?  ?Loel Dubonnet, NP " ?

## 2021-09-05 ENCOUNTER — Telehealth (HOSPITAL_BASED_OUTPATIENT_CLINIC_OR_DEPARTMENT_OTHER): Payer: Self-pay

## 2021-09-05 NOTE — Telephone Encounter (Signed)
ROUTING BACK TO Elonda Husky RN: ?Was this the information you used? ?Called the pharmacy to obtain insurance information is as follows: ?Id: [id number] ?Bin: [id number] ?Pcn: [id number] ?Group: [id number] ?

## 2021-09-05 NOTE — Telephone Encounter (Signed)
Prior auth completed on cover my meds and awaiting determination  ?

## 2021-09-05 NOTE — Telephone Encounter (Signed)
Recived PA request from sure scripts with KEY: [id number] ? ?PA attempted on Covermymeds but patient insurance not coming up upon entry.  ? ?Routing to [employer] for PA help.  ?

## 2021-10-17 ENCOUNTER — Other Ambulatory Visit: Payer: Self-pay | Admitting: *Deleted

## 2021-10-17 ENCOUNTER — Encounter: Payer: Self-pay | Admitting: Adult Health

## 2021-10-17 ENCOUNTER — Ambulatory Visit (INDEPENDENT_AMBULATORY_CARE_PROVIDER_SITE_OTHER): Payer: Medicare Other | Admitting: Adult Health

## 2021-10-17 VITALS — BP 128/81 | HR 68 | Ht 62.0 in | Wt 172.4 lb

## 2021-10-17 DIAGNOSIS — G2581 Restless legs syndrome: Secondary | ICD-10-CM | POA: Diagnosis not present

## 2021-10-17 DIAGNOSIS — I25118 Atherosclerotic heart disease of native coronary artery with other forms of angina pectoris: Secondary | ICD-10-CM

## 2021-10-17 DIAGNOSIS — G609 Hereditary and idiopathic neuropathy, unspecified: Secondary | ICD-10-CM | POA: Diagnosis not present

## 2021-10-17 NOTE — Progress Notes (Signed)
PATIENT: Shelley Coleman DOB: April 05, 1941  REASON FOR VISIT: follow up HISTORY FROM: patient PRIMARY NEUROLOGIST: Dr. Jannifer Franklin- will get established with another neurologist  Chief Complaint  Patient presents with   Follow-up    Room 18 - alone. RLS is still bothersome at night. She is taking ropinirole '3mg'$ , one tab 5pm and 10pm, before bed. Symptoms prevent her from getting a good night of sleep. Neuropathy is unchanged, mostly in her feet (burning, pins & needles). She is now using trazodone '50mg'$ , 0.5 tab QHS  but it does not work as well as alprazolam (both prescribed by PCP).      HISTORY OF PRESENT ILLNESS: Today 10/17/21: Shelley Coleman is a 81 year old female with a history of peripheral neuropathy, restless leg syndrome and back pain.  She returns today for follow-up. Continues to having pins and needle sensation in the bottom of feet. Disrupts her sleep at night. Tried multiple medications in the past. Has had two falls in the last year.  She states both falls occurred on her carpet in the den.  Fortunately no injuries.  She states that she also struggles with restless legs at night.  She is unsure if she is got the 3 mg tablet of Requip or the 1 mg tablet of Requip.  She returns today for an evaluation.  04/18/21: Shelley Coleman is a 81 year old female with a history of peripheral neuropathy, restless leg syndrome and back pain.  She returns today for follow-up.  She feels that her symptoms have gotten worse.  She had back surgery in 2014 and states over time she feels that her back pain has gotten worse.  States that it is in the lower back and bilaterally.  Occasionally she will have pain that goes down the right leg.  She feels that the numbness burning and tingling in both feet have gotten worse.  She states that she used a hair dryer to dry her toenails but could not feel the heat.  She notices that her balance is off but has not had any falls.  She has a hard time sleeping at night due to  restless legs.  Sometimes she will lay awake 1 to 2 hours before she is able to go to sleep.  She states that taking Requip nightly makes her drowsy the next morning.  According to the patient she has tried taking Requip several different ways such as earlier in the afternoon.  She states that she had spinal injections prior to 2014-she noticed that she had limited movement in her right toes after injections.  States that she is unable to turn or curl her toes.  HISTORY Shelley Coleman is a 81 year old right-handed white female with a history of a peripheral neuropathy and some low back pain.  The patient also suffers from restless leg syndrome.  She has had a sensitive stomach, she has gone off many of her medications because of this.  She takes 1 to 2 mg of Requip in the evening, if she takes more than this she has a hangover feeling the next morning.  She is not sleeping well, she has fatigue during the day.  She has started vitamin B12.  She has burning in the feet at night.  In the past, she got no benefit from gabapentin, Lyrica, or Cymbalta.  She takes melatonin at night.  She tries to walk 1 to 1/2 miles daily, she does have some shortness of breath and she goes up hills.  She returns to  this office for an evaluation   REVIEW OF SYSTEMS: Out of a complete 14 system review of symptoms, the patient complains only of the following symptoms, and all other reviewed systems are negative.  ALLERGIES: Allergies  Allergen Reactions   Diflunisal     REACTION: rash , swelling  anaphlaxsis   Lipitor [Atorvastatin]     Pt had body joint pain   Statins     Myalgia     Tetanus Toxoid     REACTION: arm swelling, fever    HOME MEDICATIONS: Outpatient Medications Prior to Visit  Medication Sig Dispense Refill   acetaminophen (TYLENOL) 500 MG tablet Take 1,000 mg by mouth every 6 (six) hours as needed for moderate pain or headache.     atenolol (TENORMIN) 50 MG tablet Take 1 tablet (50 mg total) by mouth  daily. 90 tablet 3   Bempedoic Acid-Ezetimibe (NEXLIZET) 180-10 MG TABS Please take one tablet daily. 90 tablet 3   clopidogrel (PLAVIX) 75 MG tablet Take 1 tablet (75 mg total) by mouth daily. 90 tablet 3   Cyanocobalamin 3000 MCG CAPS Take 3,000 mcg by mouth in the morning and at bedtime.     ferrous sulfate 325 (65 FE) MG tablet Take 325 mg by mouth daily with breakfast.     Multiple Vitamins-Minerals (CENTRUM SILVER 50+WOMEN) TABS Take 1 tablet by mouth daily.     Multiple Vitamins-Minerals (PRESERVISION AREDS 2 PO) Take 1 tablet by mouth in the morning and at bedtime.     nitroGLYCERIN (NITROSTAT) 0.4 MG SL tablet PLACE ONE TABLET UNDER THE TONGUE EVERY 5 MINUTES AS NEEDED. 100 tablet 0   Polyethyl Glycol-Propyl Glycol (SYSTANE OP) Place 1 drop into both eyes daily as needed (dry eyes).     rOPINIRole (REQUIP) 3 MG tablet Take 3 mg by mouth at bedtime as needed (rls).     traZODone (DESYREL) 50 MG tablet Take 0.5-1 tablets (25-50 mg total) by mouth at bedtime as needed for sleep. 30 tablet 3   pantoprazole (PROTONIX) 40 MG tablet Take 1 tablet (40 mg total) by mouth 2 (two) times daily. 60 tablet 11   0.9 %  sodium chloride infusion      No facility-administered medications prior to visit.    PAST MEDICAL HISTORY: Past Medical History:  Diagnosis Date   Anemia    Has had iron infusions   Breast cancer ([place])    right   Cerebrovascular disease 11/12/2018   Depression    Depression    GERD (gastroesophageal reflux disease)    Hiatal hernia    History of GI bleed    Hx of mitral valve prolapse    Hyperlipidemia    Irregular heart beat    Neuropathy    numbness in both feet   Nocturnal leg cramps 09/02/2015   Optic neuritis    left   Osteopenia    Ovarian cyst    Parotid tumor    right   Pelvic fracture ([facility])    Restless legs syndrome    Shingles 2000   Left in V1   Stroke ([place])     PAST SURGICAL HISTORY: Past Surgical History:  Procedure Laterality Date   ABDOMINAL  HYSTERECTOMY     APPENDECTOMY     BREAST LUMPECTOMY Left    CATARACT EXTRACTION     ESOPHAGEAL MANOMETRY N/A 01/13/2013   Procedure: ESOPHAGEAL MANOMETRY (EM);  Surgeon: Sable Feil, MD;  Location: [facility];  Service: Endoscopy;  Laterality: N/A;   ESOPHAGOGASTRODUODENOSCOPY (  EGD) WITH PROPOFOL N/A 10/30/2020   Procedure: ESOPHAGOGASTRODUODENOSCOPY (EGD) WITH PROPOFOL;  Surgeon: Mauri Pole, MD;  Location: [facility];  Service: Endoscopy;  Laterality: N/A;   FOREIGN BODY REMOVAL  10/30/2020   Procedure: FOREIGN BODY REMOVAL;  Surgeon: Mauri Pole, MD;  Location: [facility];  Service: Endoscopy;;   HERNIA REPAIR  08/2009   HOT HEMOSTASIS N/A 10/30/2020   Procedure: HOT HEMOSTASIS (ARGON PLASMA COAGULATION/BICAP);  Surgeon: Mauri Pole, MD;  Location: [facility] END[facility]COPY;  Service: Endoscopy;  Laterality: N/A;   MASTECTOMY Right    SPINE SURGERY     TOTAL HIP ARTHROPLASTY Left     FAMILY HISTORY: Family History  Problem Relation Age of Onset   Cancer Mother        Angio sarcoma    Heart disease Father    Hypertension Father    Stroke Father        x2   Heart attack Father        x 11   Hernia Brother    Hypertension Brother    Neuropathy Neg Hx    Restless legs syndrome Neg Hx    Colon cancer Neg Hx    Esophageal cancer Neg Hx    Pancreatic cancer Neg Hx    Stomach cancer Neg Hx     SOCIAL HISTORY: Social History   Socioeconomic History   Marital status: Married    Spouse name: Not on file   Number of children: 2   Years of education: 16   Highest education level: Not on file  Occupational History   Occupation: Retired Therapist, sports  Tobacco Use   Smoking status: Never   Smokeless tobacco: Never  Vaping Use   Vaping Use: Never used  Substance and Sexual Activity   Alcohol use: Yes    Alcohol/week: 1.0 standard drink of alcohol    Types: 1 Glasses of wine per week    Comment: Rarely,socially   Drug use: No   Sexual activity: Not on file  Other  Topics Concern   Not on file  Social History Narrative   Retired from nursing    Widowed    Left-handed      Social Determinants of Health   Financial Resource Strain: Low Risk  (07/27/2021)   Overall Financial Resource Strain (CARDIA)    Difficulty of Paying Living Expenses: Not very hard  Food Insecurity: No Food Insecurity (01/17/2021)   Hunger Vital Sign    Worried About Running Out of Food in the Last Year: Never true    Ran Out of Food in the Last Year: Never true  Transportation Needs: No Transportation Needs (07/27/2021)   PRAPARE - Hydrologist (Medical): No    Lack of Transportation (Non-Medical): No  Physical Activity: Inactive (01/17/2021)   Exercise Vital Sign    Days of Exercise per Week: 0 days    Minutes of Exercise per Session: 0 min  Stress: No Stress Concern Present (01/17/2021)   [place]    Feeling of Stress : Only a little  Social Connections: Moderately Isolated (01/17/2021)   Social Connection and Isolation Panel [NHANES]    Frequency of Communication with Friends and Family: Three times a week    Frequency of Social Gatherings with Friends and Family: Three times a week    Attends Religious Services: More than 4 times per year    Active Member of Clubs or Organizations: No  Attends Archivist Meetings: Never    Marital Status: Widowed  Intimate Partner Violence: Not At Risk (01/17/2021)   Humiliation, Afraid, Rape, and Kick questionnaire    Fear of Current or Ex-Partner: No    Emotionally Abused: No    Physically Abused: No    Sexually Abused: No      PHYSICAL EXAM  Vitals:   10/17/21 1352  BP: 128/81  Pulse: 68  Weight: 172 lb 6.4 oz (78.2 kg)  Height: '5\' 2"'$  (1.575 m)   Body mass index is 31.53 kg/m.  Generalized: Well developed, in no acute distress   Neurological examination  Mentation: Alert oriented to time, place, history  taking. Follows all commands speech and language fluent Cranial nerve II-XII: Pupils were equal round reactive to light. Extraocular movements were full, visual field were full on confrontational test. Facial sensation and strength were normal. Uvula tongue midline. Head turning and shoulder shrug  were normal and symmetric. Motor: The motor testing reveals 5 over 5 strength of all 4 extremities with exception slightly weaker in the right lower extremity 4/5 strength.  Ankle weakness noted on the right good symmetric motor tone is noted throughout.  Sensory: Sensory testing is intact to soft touch on all 4 extremities. No evidence of extinction is noted.  Coordination: Cerebellar testing reveals good finger-nose-finger and heel-to-shin bilaterally.  Gait and station: Patient is able to stand without assistance.  Tandem gait not attempted.     DIAGNOSTIC DATA (LABS, IMAGING, TESTING) - I reviewed patient records, labs, notes, testing and imaging myself where available.  Lab Results  Component Value Date   WBC 4.7 06/15/2021   HGB 13.0 06/15/2021   HCT 40.1 06/15/2021   MCV 87.1 06/15/2021   PLT 233.0 06/15/2021      Component Value Date/Time   NA 139 06/15/2021 1013   K 4.0 06/15/2021 1013   CL 104 06/15/2021 1013   CO2 28 06/15/2021 1013   GLUCOSE 78 06/15/2021 1013   BUN 25 (H) 06/15/2021 1013   CREATININE 0.70 06/15/2021 1013   CREATININE 0.77 04/28/2016 1552   CALCIUM 9.6 06/15/2021 1013   PROT 7.5 06/15/2021 1013   ALBUMIN 4.1 06/15/2021 1013   AST 16 06/15/2021 1013   ALT 12 06/15/2021 1013   ALKPHOS 76 06/15/2021 1013   BILITOT 0.5 06/15/2021 1013   GFRNONAA >60 10/31/2020 0205   GFRAA >60 11/02/2015 0524   Lab Results  Component Value Date   CHOL 263 (H) 06/15/2021   HDL 60.70 06/15/2021   LDLCALC 158 (H) 08/28/2019   LDLDIRECT 185.0 06/15/2021   TRIG 211.0 (H) 06/15/2021   CHOLHDL 4 06/15/2021   Lab Results  Component Value Date   HGBA1C 5.3 11/01/2015    Lab Results  Component Value Date   VITAMINB12 439 10/29/2020   Lab Results  Component Value Date   TSH 1.45 06/15/2021      ASSESSMENT AND PLAN 81 y.o. year old female  has a past medical history of Anemia, Breast cancer ([place]), Cerebrovascular disease (11/12/2018), Depression, Depression, GERD (gastroesophageal reflux disease), Hiatal hernia, History of GI bleed, mitral valve prolapse, Hyperlipidemia, Irregular heart beat, Neuropathy, Nocturnal leg cramps (09/02/2015), Optic neuritis, Osteopenia, Ovarian cyst, Parotid tumor, Pelvic fracture ([place]), Restless legs syndrome, Shingles (2000), and Stroke ([place]). here with:  1.  Restless leg syndrome  -Patient unsure what dose of Requip she is on.  She is going to check her prescription at home and call us back with the dosing  2. .  Peripheral neuropathy  -Has tried and failed multiple medications in the past including Cymbalta, Lyrica, gabapentin -Order sent to [facility] for compounded cream  Patient was previously established with Dr. Jannifer Franklin.  She will get established with another neurologist I recommended Dr. Krista Blue.  FU 6 months or sooner if needed.      Ward Givens, MSN, NP-C 10/17/2021, 1:58 PM [facility] [address], [place], [place] [address] [phone]

## 2021-10-21 ENCOUNTER — Telehealth: Payer: Self-pay | Admitting: Adult Health

## 2021-11-03 DIAGNOSIS — H35363 Drusen (degenerative) of macula, bilateral: Secondary | ICD-10-CM | POA: Diagnosis not present

## 2021-11-03 DIAGNOSIS — H353112 Nonexudative age-related macular degeneration, right eye, intermediate dry stage: Secondary | ICD-10-CM | POA: Diagnosis not present

## 2021-11-03 DIAGNOSIS — H353221 Exudative age-related macular degeneration, left eye, with active choroidal neovascularization: Secondary | ICD-10-CM | POA: Diagnosis not present

## 2021-11-03 DIAGNOSIS — H3589 Other specified retinal disorders: Secondary | ICD-10-CM | POA: Diagnosis not present

## 2021-11-18 ENCOUNTER — Telehealth: Payer: Self-pay | Admitting: Pharmacist

## 2021-11-18 NOTE — Chronic Care Management (AMB) (Signed)
Chronic Care Management Pharmacy Assistant   Name: Shelley Coleman  MRN: [id number] DOB: 20-Jan-1941  Reason for Encounter: Disease State   Conditions to be addressed/monitored: General Assessment  Recent office visits:  None   Recent consult visits:  10/17/21 Ward Givens, NP (Neurology) - Patient presented for Hereditary and idiopathic peripheral neuropathy and other concerns. Stopped Sodium Chloride.  08/04/21 Rella Larve, MD (Ophthalmology) - Patient presented for Injection and other concerns. No medication changes.  Hospital visits:  None in previous 6 months  Medications: Outpatient Encounter Medications as of 11/18/2021  Medication Sig   acetaminophen (TYLENOL) 500 MG tablet Take 1,000 mg by mouth every 6 (six) hours as needed for moderate pain or headache.   atenolol (TENORMIN) 50 MG tablet Take 1 tablet (50 mg total) by mouth daily.   Bempedoic Acid-Ezetimibe (NEXLIZET) 180-10 MG TABS Please take one tablet daily.   clopidogrel (PLAVIX) 75 MG tablet Take 1 tablet (75 mg total) by mouth daily.   Cyanocobalamin 3000 MCG CAPS Take 3,000 mcg by mouth in the morning and at bedtime.   ferrous sulfate 325 (65 FE) MG tablet Take 325 mg by mouth daily with breakfast.   Multiple Vitamins-Minerals (CENTRUM SILVER 50+WOMEN) TABS Take 1 tablet by mouth daily.   Multiple Vitamins-Minerals (PRESERVISION AREDS 2 PO) Take 1 tablet by mouth in the morning and at bedtime.   nitroGLYCERIN (NITROSTAT) 0.4 MG SL tablet PLACE ONE TABLET UNDER THE TONGUE EVERY 5 MINUTES AS NEEDED.   NONFORMULARY OR COMPOUNDED ITEM as needed. Formula 25: Doxepin 3% Amantadine 3% Dextromethorphan 2% Lidocaine 2%     pantoprazole (PROTONIX) 40 MG tablet Take 1 tablet (40 mg total) by mouth 2 (two) times daily.   Polyethyl Glycol-Propyl Glycol (SYSTANE OP) Place 1 drop into both eyes daily as needed (dry eyes).   rOPINIRole (REQUIP) 3 MG tablet Take 3 mg by mouth at bedtime as needed (rls).    traZODone (DESYREL) 50 MG tablet Take 0.5-1 tablets (25-50 mg total) by mouth at bedtime as needed for sleep.   No facility-administered encounter medications on file as of 11/18/2021.   [place] for General Review Call  Adherence Review:  Does the Clinical Pharmacist Assistant have access to adherence rates? Yes Adherence rates for STAR metric medications  : none  Disease State Questions:  Able to connect with Patient? Yes Did patient have any problems with their health recently? No  Have you had any admissions or emergency room visits or worsening of your condition(s) since last visit? No  Have you had any visits with new specialists or providers since your last visit? No Explain: Have you had any new health care problem(s) since your last visit? No  Have you run out of any of your medications since you last spoke with clinical pharmacist? No  Are there any medications you are not taking as prescribed? Yes Patient reports she isnt really using the trazodone, she reports it does not work well for her she stays awake for hours after using and in the morning she feels bad like she has a hangover She has tried taking half a tab and tried a whole tab ( on different days). She reports she used Xanax PRN prior but that was changed to Trazodone and shed like to go back, it got her to sleep within 15 min and no hangover feeling the next day. Are you having any issues or side effects with your medications? No  Do you have any  other health concerns or questions you want to discuss with your Clinical Pharmacist before your next visit? No  Are there any health concerns that you feel we can do a better job addressing? No  Are you having any problems with any of the following since the last visit: (select all that apply)  Sleeping  Details: See above 12. Any falls since last visit? No  13. Any increased or uncontrolled pain since last visit? No  Details: Patient reports she has  some stomach pains and issues but is following them closely with Gastro.   14 Additional Details? Concerns about Xanax communicated to MP for assistance, advised patient I would let her know what she thinks, she was in agreement.  Per Watt Climes Patient would need to make an appointment with Provider for approval of switch back, patient made aware.  Care Gaps: Zoster Vaccine - Overdue TDAP - Overdue COVID Booster - Overdue BP- 128/81 10/17/21 AWV- 9/22   Star Rating Drugs: None    Ned Clines CMA Clinical Pharmacist Assistant [phone]

## 2021-12-01 ENCOUNTER — Telehealth: Payer: Self-pay | Admitting: Adult Health

## 2021-12-01 ENCOUNTER — Other Ambulatory Visit: Payer: Self-pay | Admitting: Adult Health

## 2021-12-01 NOTE — Telephone Encounter (Signed)
Can you please schedule pt for appt. Thanks

## 2021-12-01 NOTE — Telephone Encounter (Signed)
2 nights ago felt lump in left breast requests mammogram

## 2021-12-01 NOTE — Telephone Encounter (Signed)
Called pt no answer °

## 2021-12-06 ENCOUNTER — Encounter: Payer: Self-pay | Admitting: Adult Health

## 2021-12-06 ENCOUNTER — Ambulatory Visit (INDEPENDENT_AMBULATORY_CARE_PROVIDER_SITE_OTHER): Payer: Medicare Other | Admitting: Adult Health

## 2021-12-06 VITALS — BP 130/80 | HR 71 | Temp 98.3°F | Ht 62.0 in | Wt 174.0 lb

## 2021-12-06 DIAGNOSIS — N63 Unspecified lump in unspecified breast: Secondary | ICD-10-CM | POA: Diagnosis not present

## 2021-12-06 DIAGNOSIS — F5101 Primary insomnia: Secondary | ICD-10-CM | POA: Diagnosis not present

## 2021-12-06 DIAGNOSIS — I25118 Atherosclerotic heart disease of native coronary artery with other forms of angina pectoris: Secondary | ICD-10-CM

## 2021-12-06 DIAGNOSIS — Z853 Personal history of malignant neoplasm of breast: Secondary | ICD-10-CM | POA: Diagnosis not present

## 2021-12-06 MED ORDER — ALPRAZOLAM 1 MG PO TABS: 0.5000 mg | ORAL_TABLET | Freq: Two times a day (BID) | ORAL | 1 refills | Status: DC | PRN

## 2021-12-06 NOTE — Progress Notes (Signed)
Subjective:    Patient ID: Shelley Coleman, female    DOB: [birth year], 81 y.o.   MRN: [id number]  HPI  81 year old female who  has a past medical history of Anemia, Breast cancer ([place]), Cerebrovascular disease (11/12/2018), Depression, Depression, GERD (gastroesophageal reflux disease), Hiatal hernia, History of GI bleed, mitral valve prolapse, Hyperlipidemia, Irregular heart beat, Neuropathy, Nocturnal leg cramps (09/02/2015), Optic neuritis, Osteopenia, Ovarian cyst, Parotid tumor, Pelvic fracture ([place]), Restless legs syndrome, Shingles (2000), and Stroke ([place]).  She has a history of right sided breast cancer sp mastectomy. Two nights ago after her bath she was doing a self breast exam and felt some irregularities that may have been a nodule. Her last mammogram was in 2021.    She would also like to go back on Xanax for insomnia. We trialed her on Trazodone 25-50 mg. She reports that she has been trying it but 25 mg is not enough to get her to sleep and 50 mg is too much and she wakes up with a hang over effect. She slept much better on xanax.   Review of Systems See HPI   Past Medical History:  Diagnosis Date   Anemia    Has had iron infusions   Breast cancer ([place])    right   Cerebrovascular disease 11/12/2018   Depression    Depression    GERD (gastroesophageal reflux disease)    Hiatal hernia    History of GI bleed    Hx of mitral valve prolapse    Hyperlipidemia    Irregular heart beat    Neuropathy    numbness in both feet   Nocturnal leg cramps 09/02/2015   Optic neuritis    left   Osteopenia    Ovarian cyst    Parotid tumor    right   Pelvic fracture ([facility])    Restless legs syndrome    Shingles 2000   Left in V1   Stroke [facility])     Social History   Socioeconomic History   Marital status: Married    Spouse name: Not on file   Number of children: 2   Years of education: 16   Highest education level: Not on file  Occupational History   Occupation: Retired Therapist, sports   Tobacco Use   Smoking status: Never   Smokeless tobacco: Never  Vaping Use   Vaping Use: Never used  Substance and Sexual Activity   Alcohol use: Yes    Alcohol/week: 1.0 standard drink of alcohol    Types: 1 Glasses of wine per week    Comment: Rarely,socially   Drug use: No   Sexual activity: Not on file  Other Topics Concern   Not on file  Social History Narrative   Retired from nursing    Widowed    Left-handed      Social Determinants of Health   Financial Resource Strain: Low Risk  (07/27/2021)   Overall Financial Resource Strain (CARDIA)    Difficulty of Paying Living Expenses: Not very hard  Food Insecurity: No Food Insecurity (01/17/2021)   Hunger Vital Sign    Worried About Running Out of Food in the Last Year: Never true    Ran Out of Food in the Last Year: Never true  Transportation Needs: No Transportation Needs (07/27/2021)   PRAPARE - Hydrologist (Medical): No    Lack of Transportation (Non-Medical): No  Physical Activity: Inactive (01/17/2021)   Exercise Vital Sign  Days of Exercise per Week: 0 days    Minutes of Exercise per Session: 0 min  Stress: No Stress Concern Present (01/17/2021)   [place]    Feeling of Stress : Only a little  Social Connections: Moderately Isolated (01/17/2021)   Social Connection and Isolation Panel [NHANES]    Frequency of Communication with Friends and Family: Three times a week    Frequency of Social Gatherings with Friends and Family: Three times a week    Attends Religious Services: More than 4 times per year    Active Member of Clubs or Organizations: No    Attends Archivist Meetings: Never    Marital Status: Widowed  Intimate Partner Violence: Not At Risk (01/17/2021)   Humiliation, Afraid, Rape, and Kick questionnaire    Fear of Current or Ex-Partner: No    Emotionally Abused: No    Physically Abused: No     Sexually Abused: No    Past Surgical History:  Procedure Laterality Date   ABDOMINAL HYSTERECTOMY     APPENDECTOMY     BREAST LUMPECTOMY Left    CATARACT EXTRACTION     ESOPHAGEAL MANOMETRY N/A 01/13/2013   Procedure: ESOPHAGEAL MANOMETRY (EM);  Surgeon: Sable Feil, MD;  Location: [facility];  Service: Endoscopy;  Laterality: N/A;   ESOPHAGOGASTRODUODENOSCOPY (EGD) WITH PROPOFOL N/A 10/30/2020   Procedure: ESOPHAGOGASTRODUODENOSCOPY (EGD) WITH PROPOFOL;  Surgeon: Mauri Pole, MD;  Location: [facility];  Service: Endoscopy;  Laterality: N/A;   FOREIGN BODY REMOVAL  10/30/2020   Procedure: FOREIGN BODY REMOVAL;  Surgeon: Mauri Pole, MD;  Location: [place];  Service: Endoscopy;;   HERNIA REPAIR  08/2009   HOT HEMOSTASIS N/A 10/30/2020   Procedure: HOT HEMOSTASIS (ARGON PLASMA COAGULATION/BICAP);  Surgeon: Mauri Pole, MD;  Location: [facility] ENDOSCOPY;  Service: Endoscopy;  Laterality: N/A;   MASTECTOMY Right    SPINE SURGERY     TOTAL HIP ARTHROPLASTY Left     Family History  Problem Relation Age of Onset   Cancer Mother        Angio sarcoma    Heart disease Father    Hypertension Father    Stroke Father        x2   Heart attack Father        x 11   Hernia Brother    Hypertension Brother    Neuropathy Neg Hx    Restless legs syndrome Neg Hx    Colon cancer Neg Hx    Esophageal cancer Neg Hx    Pancreatic cancer Neg Hx    Stomach cancer Neg Hx     Allergies  Allergen Reactions   Diflunisal     REACTION: rash , swelling  anaphlaxsis   Lipitor [Atorvastatin]     Pt had body joint pain   Statins     Myalgia     Tetanus Toxoid     REACTION: arm swelling, fever    Current Outpatient Medications on File Prior to Visit  Medication Sig Dispense Refill   acetaminophen (TYLENOL) 500 MG tablet Take 1,000 mg by mouth every 6 (six) hours as needed for moderate pain or headache.     atenolol (TENORMIN) 50 MG tablet Take 1 tablet (50 mg total) by  mouth daily. 90 tablet 3   Bempedoic Acid-Ezetimibe (NEXLIZET) 180-10 MG TABS Please take one tablet daily. 90 tablet 3   clopidogrel (PLAVIX) 75 MG tablet Take 1 tablet (75 mg total)  by mouth daily. 90 tablet 3   Cyanocobalamin 3000 MCG CAPS Take 3,000 mcg by mouth in the morning and at bedtime.     ferrous sulfate 325 (65 FE) MG tablet Take 325 mg by mouth daily with breakfast.     Multiple Vitamins-Minerals (CENTRUM SILVER 50+WOMEN) TABS Take 1 tablet by mouth daily.     Multiple Vitamins-Minerals (PRESERVISION AREDS 2 PO) Take 1 tablet by mouth in the morning and at bedtime.     nitroGLYCERIN (NITROSTAT) 0.4 MG SL tablet PLACE ONE TABLET UNDER THE TONGUE EVERY 5 MINUTES AS NEEDED. 100 tablet 0   NONFORMULARY OR COMPOUNDED ITEM as needed. Formula 25: Doxepin 3% Amantadine 3% Dextromethorphan 2% Lidocaine 2%       Polyethyl Glycol-Propyl Glycol (SYSTANE OP) Place 1 drop into both eyes daily as needed (dry eyes).     rOPINIRole (REQUIP) 3 MG tablet Take 3 mg by mouth at bedtime as needed (rls).     traZODone (DESYREL) 50 MG tablet Take 0.5-1 tablets (25-50 mg total) by mouth at bedtime as needed for sleep. 30 tablet 3   pantoprazole (PROTONIX) 40 MG tablet Take 1 tablet (40 mg total) by mouth 2 (two) times daily. 60 tablet 11   No current facility-administered medications on file prior to visit.    BP 130/80   Pulse 71   Temp 98.3 F (36.8 C) (Oral)   Ht '5\' 2"'$  (1.575 m)   Wt 174 lb (78.9 kg)   SpO2 98%   BMI 31.83 kg/m       Objective:   Physical Exam Vitals and nursing note reviewed.  Constitutional:      Appearance: Normal appearance.  Chest:  Breasts:    Left: Mass present.     Comments: Questionable mass at 6 o clock position.  Musculoskeletal:        General: Normal range of motion.  Skin:    General: Skin is warm and dry.     Capillary Refill: Capillary refill takes less than 2 seconds.  Neurological:     General: No focal deficit present.     Mental Status:  She is alert.       Assessment & Plan:  1. Breast nodule  - MM Digital Diagnostic Unilat L; Future  2. History of breast cancer in female  - MM Digital Diagnostic Unilat L; Future  3. Primary insomnia  - ALPRAZolam (XANAX) 1 MG tablet; Take 0.5-1 tablets (0.5-1 mg total) by mouth 2 (two) times daily as needed for anxiety.  Dispense: 30 tablet; Refill: 1  Dorothyann Peng, NP

## 2021-12-15 ENCOUNTER — Ambulatory Visit
Admission: RE | Admit: 2021-12-15 | Discharge: 2021-12-15 | Disposition: A | Discharge status: Home / Self Care | Payer: Medicare Other | Source: Ambulatory Visit | Attending: Adult Health | Admitting: Adult Health

## 2021-12-15 ENCOUNTER — Other Ambulatory Visit: Payer: Self-pay | Admitting: Adult Health

## 2021-12-15 DIAGNOSIS — Z853 Personal history of malignant neoplasm of breast: Secondary | ICD-10-CM

## 2021-12-15 DIAGNOSIS — N63 Unspecified lump in unspecified breast: Secondary | ICD-10-CM

## 2021-12-15 DIAGNOSIS — R928 Other abnormal and inconclusive findings on diagnostic imaging of breast: Secondary | ICD-10-CM | POA: Diagnosis not present

## 2021-12-15 DIAGNOSIS — N6002 Solitary cyst of left breast: Secondary | ICD-10-CM | POA: Diagnosis not present

## 2022-01-16 ENCOUNTER — Telehealth: Payer: Self-pay | Admitting: Pharmacist

## 2022-01-16 NOTE — Chronic Care Management (AMB) (Signed)
Chronic Care Management Pharmacy Assistant   Name: Shelley Coleman  MRN: [id number] DOB: [birth year]  Reason for Encounter: Disease State   Conditions to be addressed/monitored: HLD  Recent office visits:  12/06/21 Dorothyann Peng, NP - Patient presented for Breast nodule and other concerns. Stopped Trazodone. Prescribed Alprazolam.  Recent consult visits:  None   Hospital visits:  None in previous 6 months  Medications: Outpatient Encounter Medications as of 01/16/2022  Medication Sig   acetaminophen (TYLENOL) 500 MG tablet Take 1,000 mg by mouth every 6 (six) hours as needed for moderate pain or headache.   ALPRAZolam (XANAX) 1 MG tablet Take 0.5-1 tablets (0.5-1 mg total) by mouth 2 (two) times daily as needed for anxiety.   atenolol (TENORMIN) 50 MG tablet Take 1 tablet (50 mg total) by mouth daily.   Bempedoic Acid-Ezetimibe (NEXLIZET) 180-10 MG TABS Please take one tablet daily.   clopidogrel (PLAVIX) 75 MG tablet Take 1 tablet (75 mg total) by mouth daily.   Cyanocobalamin 3000 MCG CAPS Take 3,000 mcg by mouth in the morning and at bedtime.   ferrous sulfate 325 (65 FE) MG tablet Take 325 mg by mouth daily with breakfast.   Multiple Vitamins-Minerals (CENTRUM SILVER 50+WOMEN) TABS Take 1 tablet by mouth daily.   Multiple Vitamins-Minerals (PRESERVISION AREDS 2 PO) Take 1 tablet by mouth in the morning and at bedtime.   nitroGLYCERIN (NITROSTAT) 0.4 MG SL tablet PLACE ONE TABLET UNDER THE TONGUE EVERY 5 MINUTES AS NEEDED.   NONFORMULARY OR COMPOUNDED ITEM as needed. Formula 25: Doxepin 3% Amantadine 3% Dextromethorphan 2% Lidocaine 2%     pantoprazole (PROTONIX) 40 MG tablet Take 1 tablet (40 mg total) by mouth 2 (two) times daily.   Polyethyl Glycol-Propyl Glycol (SYSTANE OP) Place 1 drop into both eyes daily as needed (dry eyes).   rOPINIRole (REQUIP) 3 MG tablet Take 3 mg by mouth at bedtime as needed (rls).   No facility-administered encounter medications on file as of  01/16/2022.  01/16/2022 Name: Shelley Coleman MRN: [id number] DOB: 04-04-1941 Shelley Coleman is a 81 y.o. year old female who is a primary care patient of Dorothyann Peng, NP.  Comprehensive medication review performed; Spoke to patient regarding cholesterol  Lipid Panel    Component Value Date/Time   CHOL 263 (H) 06/15/2021 1013   TRIG 211.0 (H) 06/15/2021 1013   HDL 60.70 06/15/2021 1013   LDLCALC 158 (H) 08/28/2019 1206   LDLDIRECT 185.0 06/15/2021 1013    10-year ASCVD risk score: The ASCVD Risk score (Arnett DK, et al., 2019) failed to calculate for the following reasons:   The patient has a prior MI or stroke diagnosis  Current antihyperlipidemic regimen:  Ezetimibe 10 mg 1 tablet daily - Appropriate, Query effective, Safe, Accessible Bempedoic acid 180 mg 1 tablet daily - Appropriate, Query effective, Safe, Accessible Previous antihyperlipidemic medications tried: statins (joint pain)  ASCVD risk enhancing conditions: age >39 and HTN What recent interventions/DTPs have been made by any provider to improve Cholesterol control since last CPP Visit: Patient reports no changes nor new issues or concerns. She reports the Aprazolam does now help her get to sleep although she does not sleep through the night it is an improvement. Any recent hospitalizations or ED visits since last visit with CPP? No What diet changes have been made to improve Cholesterol?  Patient reports no changes. What exercise is being done to improve Cholesterol?  Patient reports she is still walking some.    Adherence  Review: Does the patient have >5 day gap between last estimated fill dates? No      Care Gaps: Zoster Vaccine - Overdue TDAP - Overdue COVID Booster - Overdue Flu Vaccine - Overdue CCM- PT  Declined at this time AWV- 9/22 BP- 130/80 12/06/21  Star Rating Drugs: None     Ned Clines CMA Clinical Pharmacist Assistant [phone]

## 2022-01-19 ENCOUNTER — Ambulatory Visit (INDEPENDENT_AMBULATORY_CARE_PROVIDER_SITE_OTHER): Payer: Medicare Other

## 2022-01-19 VITALS — Ht 67.5 in | Wt 174.0 lb

## 2022-01-19 DIAGNOSIS — Z0000 Encounter for general adult medical examination without abnormal findings: Secondary | ICD-10-CM

## 2022-01-19 NOTE — Progress Notes (Signed)
Subjective:   Shelley Coleman is a 81 y.o. female who presents for Medicare Annual (Subsequent) preventive examination.  Review of Systems    Virtual Visit via Telephone Note  I connected with  Shelley Coleman on 01/19/22 at 12:30 PM EDT by telephone and verified that I am speaking with the correct person using two identifiers.  Location: Patient: Home Provider: Office Persons participating in the virtual visit: patient/Nurse Health Advisor   I discussed the limitations, risks, security and privacy concerns of performing an evaluation and management service by telephone and the availability of in person appointments. The patient expressed understanding and agreed to proceed.  Interactive audio and video telecommunications were attempted between this nurse and patient, however failed, due to patient having technical difficulties OR patient did not have access to video capability.  We continued and completed visit with audio only.  Some vital signs may be absent or patient reported.   Criselda Peaches, LPN  Cardiac Risk Factors include: advanced age (>37mn, >>69women);hypertension     Objective:    Today's Vitals   01/19/22 1237  Weight: 174 lb (78.9 kg)  Height: 5' 7.5" (1.715 m)   Body mass index is 26.85 kg/m.     01/19/2022   12:46 PM 01/17/2021    1:41 PM 10/28/2020   11:50 PM 11/27/2019    2:52 PM 11/15/2016    7:21 PM 04/27/2016    3:40 PM 04/27/2016    2:59 PM  Advanced Directives  Does Patient Have a Medical Advance Directive? Yes Yes No No;Yes No;Yes Yes Yes  Type of [place]   Does patient want to make changes to medical advance directive?    No - Patient declined No - Patient declined    Copy of [place]? No - copy requested No - copy requested  No - copy requested No - copy  requested    Would patient like information on creating a medical advance directive?   No - Patient declined  No - Patient declined      Current Medications (verified) Outpatient Encounter Medications as of 01/19/2022  Medication Sig   acetaminophen (TYLENOL) 500 MG tablet Take 1,000 mg by mouth every 6 (six) hours as needed for moderate pain or headache.   ALPRAZolam (XANAX) 1 MG tablet Take 0.5-1 tablets (0.5-1 mg total) by mouth 2 (two) times daily as needed for anxiety.   atenolol (TENORMIN) 50 MG tablet Take 1 tablet (50 mg total) by mouth daily.   Bempedoic Acid-Ezetimibe (NEXLIZET) 180-10 MG TABS Please take one tablet daily.   clopidogrel (PLAVIX) 75 MG tablet Take 1 tablet (75 mg total) by mouth daily.   Cyanocobalamin 3000 MCG CAPS Take 3,000 mcg by mouth in the morning and at bedtime.   ferrous sulfate 325 (65 FE) MG tablet Take 325 mg by mouth daily with breakfast.   Multiple Vitamins-Minerals (CENTRUM SILVER 50+WOMEN) TABS Take 1 tablet by mouth daily.   Multiple Vitamins-Minerals (PRESERVISION AREDS 2 PO) Take 1 tablet by mouth in the morning and at bedtime.   nitroGLYCERIN (NITROSTAT) 0.4 MG SL tablet PLACE ONE TABLET UNDER THE TONGUE EVERY 5 MINUTES AS NEEDED.   NONFORMULARY OR COMPOUNDED ITEM as needed. Formula 25: Doxepin 3% Amantadine 3% Dextromethorphan 2% Lidocaine 2%     pantoprazole (PROTONIX) 40 MG tablet Take 1 tablet (40 mg  total) by mouth 2 (two) times daily.   Polyethyl Glycol-Propyl Glycol (SYSTANE OP) Place 1 drop into both eyes daily as needed (dry eyes).   rOPINIRole (REQUIP) 3 MG tablet Take 3 mg by mouth at bedtime as needed (rls).   No facility-administered encounter medications on file as of 01/19/2022.    Allergies (verified) Diflunisal, Lipitor [atorvastatin], Statins, and Tetanus toxoid   History: Past Medical History:  Diagnosis Date   Anemia    Has had iron infusions   Breast cancer ([facility])    right   Cerebrovascular disease 11/12/2018    Depression    Depression    GERD (gastroesophageal reflux disease)    Hiatal hernia    History of GI bleed    Hx of mitral valve prolapse    Hyperlipidemia    Irregular heart beat    Neuropathy    numbness in both feet   Nocturnal leg cramps 09/02/2015   Optic neuritis    left   Osteopenia    Ovarian cyst    Parotid tumor    right   Pelvic fracture (HCC)    Restless legs syndrome    Shingles 2000   Left in V1   Stroke [facility])    Past Surgical History:  Procedure Laterality Date   ABDOMINAL HYSTERECTOMY     APPENDECTOMY     BREAST LUMPECTOMY Left    CATARACT EXTRACTION     ESOPHAGEAL MANOMETRY N/A 01/13/2013   Procedure: ESOPHAGEAL MANOMETRY (EM);  Surgeon: Sable Feil, MD;  Location: [facility];  Service: Endoscopy;  Laterality: N/A;   ESOPHAGOGASTRODUODENOSCOPY (EGD) WITH PROPOFOL N/A 10/30/2020   Procedure: ESOPHAGOGASTRODUODENOSCOPY (EGD) WITH PROPOFOL;  Surgeon: Mauri Pole, MD;  Location: [facility]COPY;  Service: Endoscopy;  Laterality: N/A;   FOREIGN BODY REMOVAL  10/30/2020   Procedure: FOREIGN BODY REMOVAL;  Surgeon: Mauri Pole, MD;  Location: [facility];  Service: Endoscopy;;   HERNIA REPAIR  08/2009   HOT HEMOSTASIS N/A 10/30/2020   Procedure: HOT HEMOSTASIS (ARGON PLASMA COAGULATION/BICAP);  Surgeon: Mauri Pole, MD;  Location: [facility] ENDOSCOPY;  Service: Endoscopy;  Laterality: N/A;   MASTECTOMY Right    SPINE SURGERY     TOTAL HIP ARTHROPLASTY Left    Family History  Problem Relation Age of Onset   Cancer Mother        Angio sarcoma    Heart disease Father    Hypertension Father    Stroke Father        x2   Heart attack Father        x 11   Hernia Brother    Hypertension Brother    Neuropathy Neg Hx    Restless legs syndrome Neg Hx    Colon cancer Neg Hx    Esophageal cancer Neg Hx    Pancreatic cancer Neg Hx    Stomach cancer Neg Hx    Social History   Socioeconomic History   Marital status: Married    Spouse name: Not  on file   Number of children: 2   Years of education: 16   Highest education level: Not on file  Occupational History   Occupation: Retired Therapist, sports  Tobacco Use   Smoking status: Never   Smokeless tobacco: Never  Vaping Use   Vaping Use: Never used  Substance and Sexual Activity   Alcohol use: Yes    Alcohol/week: 1.0 standard drink of alcohol    Types: 1 Glasses of wine per week    Comment:  Rarely,socially   Drug use: No   Sexual activity: Not on file  Other Topics Concern   Not on file  Social History Narrative   Retired from nursing    Widowed    Left-handed      Social Determinants of Health   Financial Resource Strain: Low Risk  (01/19/2022)   Overall Financial Resource Strain (CARDIA)    Difficulty of Paying Living Expenses: Not hard at all  Food Insecurity: No Food Insecurity (01/19/2022)   Hunger Vital Sign    Worried About Running Out of Food in the Last Year: Never true    Ran Out of Food in the Last Year: Never true  Transportation Needs: No Transportation Needs (01/19/2022)   PRAPARE - Hydrologist (Medical): No    Lack of Transportation (Non-Medical): No  Physical Activity: Inactive (01/19/2022)   Exercise Vital Sign    Days of Exercise per Week: 0 days    Minutes of Exercise per Session: 0 min  Stress: No Stress Concern Present (01/19/2022)   [place]    Feeling of Stress : Not at all  Social Connections: Moderately Integrated (01/19/2022)   Social Connection and Isolation Panel [NHANES]    Frequency of Communication with Friends and Family: More than three times a week    Frequency of Social Gatherings with Friends and Family: More than three times a week    Attends Religious Services: More than 4 times per year    Active Member of [employer] or Organizations: Yes    Attends Archivist Meetings: More than 4 times per year    Marital Status: Widowed     Tobacco Counseling Counseling given: Not Answered   Clinical Intake:  Pre-visit preparation completed: No  Diabetic?  No  Interpreter Needed?: No  Information entered by :: Rolene Arbour LPN   Activities of Daily Living    01/19/2022   12:44 PM  In your present state of health, do you have any difficulty performing the following activities:  Hearing? 1  Comment Wears hearing aids  Vision? 0  Difficulty concentrating or making decisions? 0  Walking or climbing stairs? 0  Dressing or bathing? 0  Doing errands, shopping? 0  Preparing Food and eating ? N  Using the Toilet? N  In the past six months, have you accidently leaked urine? N  Do you have problems with loss of bowel control? N  Managing your Medications? N  Managing your Finances? N  Housekeeping or managing your Housekeeping? N    Patient Care Team: Dorothyann Peng, NP as PCP - General (Family Medicine) Buford Dresser, MD as PCP - Cardiology (Cardiology) Kathie Rhodes, MD (Inactive) as Consulting Physician (Urology) Viona Gilmore, [facility] as Pharmacist (Pharmacist)  Indicate any recent Medical Services you may have received from other than [facility] providers in the past year (date may be approximate).     Assessment:   This is a routine wellness examination for Maynard.  Hearing/Vision screen Hearing Screening - Comments:: Wears hearing aids Vision Screening - Comments:: Wears rx glasses - up to date with routine eye exams with  [facility]  Dietary issues and exercise activities discussed: Current Exercise Habits: The patient does not participate in regular exercise at present, Exercise limited by: None identified   Goals Addressed               This Visit's Progress     Stay  Healthy (pt-stated)         Depression Screen    01/19/2022   12:43 PM 06/15/2021    9:34 AM 01/17/2021    1:28 PM 01/17/2021    1:20 PM 11/27/2019    2:56 PM 02/05/2017    9:47 AM 04/27/2016    3:00  PM  PHQ 2/9 Scores  PHQ - 2 Score 0 1 0 0 '1 2 1  '$ PHQ- 9 Score  '9   2 6 4    '$ Fall Risk    01/19/2022   12:45 PM 01/17/2021    1:43 PM 11/27/2019    2:54 PM 03/21/2018    9:53 AM 02/05/2017    9:47 AM  Fall Risk   Falls in the past year? 1 0 0 1   Comment    Emmi Telephone Survey: data to providers prior to load   Number falls in past yr: 0 0 0 1   Comment    Emmi Telephone Survey Actual Response = 2   Injury with Fall? 0 0 0 1   Risk Factor Category      High Fall Risk  Risk for fall due to : No Fall Risks No Fall Risks Medication side effect  Impaired balance/gait;Impaired mobility  Follow up Falls prevention discussed Falls evaluation completed Falls evaluation completed;Falls prevention discussed  Falls evaluation completed;Education provided;Falls prevention discussed    FALL RISK PREVENTION PERTAINING TO THE HOME:  Any stairs in or around the home? No  If so, are there any without handrails? No  Home free of loose throw rugs in walkways, pet beds, electrical cords, etc? Yes  Adequate lighting in your home to reduce risk of falls? Yes   ASSISTIVE DEVICES UTILIZED TO PREVENT FALLS:  Life alert? No  Use of a cane, walker or w/c? No  Grab bars in the bathroom? Yes  Shower chair or bench in shower? Yes  Elevated toilet seat or a handicapped toilet? Yes   TIMED UP AND GO:  Was the test performed? No . Audio Visit   Cognitive Function:    09/02/2015   12:38 PM  MMSE - Mini Mental State Exam  Orientation to time 5  Orientation to Place 5  Registration 3  Attention/ Calculation 5  Recall 2  Language- name 2 objects 2  Language- repeat 1  Language- follow 3 step command 3  Language- read & follow direction 1  Write a sentence 1  Copy design 1  Total score 29        01/19/2022   12:46 PM 11/27/2019    2:58 PM  6CIT Screen  What Year? 0 points 0 points  What month? 0 points 0 points  What time? 0 points 0 points  Count back from 20 0 points 0 points  Months in  reverse 0 points 0 points  Repeat phrase 0 points 2 points  Total Score 0 points 2 points    Immunizations Immunization History  Administered Date(s) Administered   Influenza Split 01/11/2012   Influenza Whole 01/22/2009   Influenza, High Dose Seasonal PF 04/06/2014, 01/25/2015, 02/22/2016, 02/04/2017, 01/14/2019, 03/05/2020   Influenza,inj,Quad PF,6+ Mos 01/22/2013   Influenza,trivalent, recombinat, inj, PF 03/31/2011   Influenza-Unspecified 03/01/2018, 02/25/2021   PFIZER Comirnaty(Gray Top)Covid-19 Tri-Sucrose Vaccine 05/11/2021   PFIZER(Purple Top)SARS-COV-2 Vaccination 06/05/2019, 07/01/2019, 03/05/2020   Pneumococcal Conjugate-13 01/25/2015   Pneumococcal Polysaccharide-23 09/16/2007   Pneumococcal-Unspecified 01/14/2019   Td 05/01/2006   Zoster, Live 09/16/2007    TDAP status: Due, Education has  been provided regarding the importance of this vaccine. Advised may receive this vaccine at local pharmacy or Health Dept. Aware to provide a copy of the vaccination record if obtained from local pharmacy or Health Dept. Verbalized acceptance and understanding.  Flu Vaccine status: Up to date  Pneumococcal vaccine status: Up to date  Covid-19 vaccine status: Completed vaccines  Qualifies for Shingles Vaccine? Yes   Zostavax completed No   Shingrix Completed?: No.    Education has been provided regarding the importance of this vaccine. Patient has been advised to call insurance company to determine out of pocket expense if they have not yet received this vaccine. Advised may also receive vaccine at local pharmacy or Health Dept. Verbalized acceptance and understanding.  Screening Tests Health Maintenance  Topic Date Due   COVID-19 Vaccine (5 - Pfizer risk series) 02/04/2022 (Originally 07/06/2021)   Zoster Vaccines- Shingrix (1 of 2) 04/20/2022 (Originally 07/04/1959)   INFLUENZA VACCINE  07/30/2022 (Originally 11/29/2021)   TETANUS/TDAP  01/20/2023 (Originally 05/01/2016)   Pneumonia  Vaccine 73+ Years old  Completed   DEXA SCAN  Completed   Hepatitis C Screening  Completed   HPV VACCINES  Aged Out    Health Maintenance  There are no preventive care reminders to display for this patient.   Colorectal cancer screening: No longer required.   Mammogram status: No longer required due to Age.  Bone Density status: Completed 02/27/20. Results reflect: Bone density results: OSTEOPOROSIS. Repeat every   years.  Lung Cancer Screening: (Low Dose CT Chest recommended if Age 49-80 years, 30 pack-year currently smoking OR have quit w/in 15years.) does not qualify.     Additional Screening:  Hepatitis C Screening: does qualify; Completed 06/15/21  Vision Screening: Recommended annual ophthalmology exams for early detection of glaucoma and other disorders of the eye. Is the patient up to date with their annual eye exam?  Yes  Who is the provider or what is the name of the office in which the patient attends annual eye exams? [facility] If pt is not established with a provider, would they like to be referred to a provider to establish care? No .   Dental Screening: Recommended annual dental exams for proper oral hygiene  Community Resource Referral / Chronic Care Management:  CRR required this visit?  No   CCM required this visit?  No      Plan:     I have personally reviewed and noted the following in the patient's chart:   Medical and social history Use of alcohol, tobacco or illicit drugs  Current medications and supplements including opioid prescriptions. Patient is not currently taking opioid prescriptions. Functional ability and status Nutritional status Physical activity Advanced directives List of other physicians Hospitalizations, surgeries, and ER visits in previous 12 months Vitals Screenings to include cognitive, depression, and falls Referrals and appointments  In addition, I have reviewed and discussed with patient certain  preventive protocols, quality metrics, and best practice recommendations. A written personalized care plan for preventive services as well as general preventive health recommendations were provided to patient.     Criselda Peaches, LPN   [id number]   Nurse Notes: None

## 2022-01-19 NOTE — Patient Instructions (Addendum)
Shelley Coleman , Thank you for taking time to come for your Medicare Wellness Visit. I appreciate your ongoing commitment to your health goals. Please review the following plan we discussed and let me know if I can assist you in the future.   These are the goals we discussed:  Goals       Decrease the likelihood of falling      Exercise 3x per week (30 min per time)      patient      Continue to recover from stroke and regain independence       Patient Stated      I will continue to walk a mile daily with my dog      Stay Healthy (pt-stated)        This is a list of the screening recommended for you and due dates:  Health Maintenance  Topic Date Due   COVID-19 Vaccine (5 - Pfizer risk series) 02/04/2022*   Zoster (Shingles) Vaccine (1 of 2) 04/20/2022*   Flu Shot  07/30/2022*   Tetanus Vaccine  01/20/2023*   Pneumonia Vaccine  Completed   DEXA scan (bone density measurement)  Completed   Hepatitis C Screening: USPSTF Recommendation to screen - Ages 81-79 yo.  Completed   HPV Vaccine  Aged Out  *Topic was postponed. The date shown is not the original due date.    Advanced directives: Please bring a copy of your health care power of attorney and living will to the office to be added to your chart at your convenience.   Conditions/risks identified: None  Next appointment: Follow up in one year for your annual wellness visit    Preventive Care 65 Years and Older, Female Preventive care refers to lifestyle choices and visits with your health care provider that can promote health and wellness. What does preventive care include? A yearly physical exam. This is also called an annual well check. Dental exams once or twice a year. Routine eye exams. Ask your health care provider how often you should have your eyes checked. Personal lifestyle choices, including: Daily care of your teeth and gums. Regular physical activity. Eating a healthy diet. Avoiding tobacco and drug  use. Limiting alcohol use. Practicing safe sex. Taking low-dose aspirin every day. Taking vitamin and mineral supplements as recommended by your health care provider. What happens during an annual well check? The services and screenings done by your health care provider during your annual well check will depend on your age, overall health, lifestyle risk factors, and family history of disease. Counseling  Your health care provider may ask you questions about your: Alcohol use. Tobacco use. Drug use. Emotional well-being. Home and relationship well-being. Sexual activity. Eating habits. History of falls. Memory and ability to understand (cognition). Work and work Statistician. Reproductive health. Screening  You may have the following tests or measurements: Height, weight, and BMI. Blood pressure. Lipid and cholesterol levels. These may be checked every 5 years, or more frequently if you are over 72 years old. Skin check. Lung cancer screening. You may have this screening every year starting at age 67 if you have a 30-pack-year history of smoking and currently smoke or have quit within the past 15 years. Fecal occult blood test (FOBT) of the stool. You may have this test every year starting at age 68. Flexible sigmoidoscopy or colonoscopy. You may have a sigmoidoscopy every 5 years or a colonoscopy every 10 years starting at age 48. Hepatitis C blood test. Hepatitis B  blood test. Sexually transmitted disease (STD) testing. Diabetes screening. This is done by checking your blood sugar (glucose) after you have not eaten for a while (fasting). You may have this done every 1-3 years. Bone density scan. This is done to screen for osteoporosis. You may have this done starting at age 34. Mammogram. This may be done every 1-2 years. Talk to your health care provider about how often you should have regular mammograms. Talk with your health care provider about your test results, treatment  options, and if necessary, the need for more tests. Vaccines  Your health care provider may recommend certain vaccines, such as: Influenza vaccine. This is recommended every year. Tetanus, diphtheria, and acellular pertussis (Tdap, Td) vaccine. You may need a Td booster every 10 years. Zoster vaccine. You may need this after age 52. Pneumococcal 13-valent conjugate (PCV13) vaccine. One dose is recommended after age 46. Pneumococcal polysaccharide (PPSV23) vaccine. One dose is recommended after age 40. Talk to your health care provider about which screenings and vaccines you need and how often you need them. This information is not intended to replace advice given to you by your health care provider. Make sure you discuss any questions you have with your health care provider. Document Released: 05/14/2015 Document Revised: 01/05/2016 Document Reviewed: 02/16/2015 Elsevier Interactive Patient Education  2017 [place] Prevention in the Home Falls can cause injuries. They can happen to people of all ages. There are many things you can do to make your home safe and to help prevent falls. What can I do on the outside of my home? Regularly fix the edges of walkways and driveways and fix any cracks. Remove anything that might make you trip as you walk through a door, such as a raised step or threshold. Trim any bushes or trees on the path to your home. Use bright outdoor lighting. Clear any walking paths of anything that might make someone trip, such as rocks or tools. Regularly check to see if handrails are loose or broken. Make sure that both sides of any steps have handrails. Any raised decks and porches should have guardrails on the edges. Have any leaves, snow, or ice cleared regularly. Use sand or salt on walking paths during winter. Clean up any spills in your garage right away. This includes oil or grease spills. What can I do in the bathroom? Use night lights. Install grab  bars by the toilet and in the tub and shower. Do not use towel bars as grab bars. Use non-skid mats or decals in the tub or shower. If you need to sit down in the shower, use a plastic, non-slip stool. Keep the floor dry. Clean up any water that spills on the floor as soon as it happens. Remove soap buildup in the tub or shower regularly. Attach bath mats securely with double-sided non-slip rug tape. Do not have throw rugs and other things on the floor that can make you trip. What can I do in the bedroom? Use night lights. Make sure that you have a light by your bed that is easy to reach. Do not use any sheets or blankets that are too big for your bed. They should not hang down onto the floor. Have a firm chair that has side arms. You can use this for support while you get dressed. Do not have throw rugs and other things on the floor that can make you trip. What can I do in the kitchen? Clean up any spills  right away. Avoid walking on wet floors. Keep items that you use a lot in easy-to-reach places. If you need to reach something above you, use a strong step stool that has a grab bar. Keep electrical cords out of the way. Do not use floor polish or wax that makes floors slippery. If you must use wax, use non-skid floor wax. Do not have throw rugs and other things on the floor that can make you trip. What can I do with my stairs? Do not leave any items on the stairs. Make sure that there are handrails on both sides of the stairs and use them. Fix handrails that are broken or loose. Make sure that handrails are as long as the stairways. Check any carpeting to make sure that it is firmly attached to the stairs. Fix any carpet that is loose or worn. Avoid having throw rugs at the top or bottom of the stairs. If you do have throw rugs, attach them to the floor with carpet tape. Make sure that you have a light switch at the top of the stairs and the bottom of the stairs. If you do not have them,  ask someone to add them for you. What else can I do to help prevent falls? Wear shoes that: Do not have high heels. Have rubber bottoms. Are comfortable and fit you well. Are closed at the toe. Do not wear sandals. If you use a stepladder: Make sure that it is fully opened. Do not climb a closed stepladder. Make sure that both sides of the stepladder are locked into place. Ask someone to hold it for you, if possible. Clearly mark and make sure that you can see: Any grab bars or handrails. First and last steps. Where the edge of each step is. Use tools that help you move around (mobility aids) if they are needed. These include: Canes. Walkers. Scooters. Crutches. Turn on the lights when you go into a dark area. Replace any light bulbs as soon as they burn out. Set up your furniture so you have a clear path. Avoid moving your furniture around. If any of your floors are uneven, fix them. If there are any pets around you, be aware of where they are. Review your medicines with your doctor. Some medicines can make you feel dizzy. This can increase your chance of falling. Ask your doctor what other things that you can do to help prevent falls. This information is not intended to replace advice given to you by your health care provider. Make sure you discuss any questions you have with your health care provider. Document Released: 02/11/2009 Document Revised: 09/23/2015 Document Reviewed: 05/22/2014 Elsevier Interactive Patient Education  2017 [employer].

## 2022-01-25 ENCOUNTER — Ambulatory Visit (INDEPENDENT_AMBULATORY_CARE_PROVIDER_SITE_OTHER): Payer: Medicare Other | Admitting: Cardiology

## 2022-01-25 ENCOUNTER — Encounter (HOSPITAL_BASED_OUTPATIENT_CLINIC_OR_DEPARTMENT_OTHER): Payer: Self-pay | Admitting: Cardiology

## 2022-01-25 VITALS — BP 122/74 | HR 65 | Ht 67.5 in | Wt 179.9 lb

## 2022-01-25 DIAGNOSIS — T466X5A Adverse effect of antihyperlipidemic and antiarteriosclerotic drugs, initial encounter: Secondary | ICD-10-CM | POA: Diagnosis not present

## 2022-01-25 DIAGNOSIS — R5382 Chronic fatigue, unspecified: Secondary | ICD-10-CM | POA: Diagnosis not present

## 2022-01-25 DIAGNOSIS — Z79899 Other long term (current) drug therapy: Secondary | ICD-10-CM | POA: Diagnosis not present

## 2022-01-25 DIAGNOSIS — I25118 Atherosclerotic heart disease of native coronary artery with other forms of angina pectoris: Secondary | ICD-10-CM

## 2022-01-25 DIAGNOSIS — M791 Myalgia, unspecified site: Secondary | ICD-10-CM

## 2022-01-25 DIAGNOSIS — Z8673 Personal history of transient ischemic attack (TIA), and cerebral infarction without residual deficits: Secondary | ICD-10-CM

## 2022-01-25 DIAGNOSIS — I10 Essential (primary) hypertension: Secondary | ICD-10-CM | POA: Diagnosis not present

## 2022-01-25 MED ORDER — NITROGLYCERIN 0.4 MG SL SUBL: SUBLINGUAL_TABLET | SUBLINGUAL | 0 refills | Status: DC

## 2022-01-25 NOTE — Patient Instructions (Signed)
Medication Instructions:  Your Physician recommend you continue on your current medication as directed.    *If you need a refill on your cardiac medications before your next appointment, please call your pharmacy*   Lab Work: Your provider has recommended lab work (fasting lipid) Please have this collected at [facility] [place]. The lab is open 8:00 am - 4:30 pm. Please avoid 12:00p - 1:00p for lunch hour. You do not need an appointment. Please go to [address] [place], [place] [phone]. This is in the Primary Care office on the 3rd floor, let them know you are there for blood work and they will direct you to the lab.  If you have labs (blood work) drawn today and your tests are completely normal, you will receive your results only by: [place] (if you have MyChart) OR A paper copy in the mail If you have any lab test that is abnormal or we need to change your treatment, we will call you to review the results.   Testing/Procedures: None ordered today   Follow-Up: At [facility], you and your health needs are our priority.  As part of our continuing mission to provide you with exceptional heart care, we have created designated Provider Care Teams.  These Care Teams include your primary Cardiologist (physician) and Advanced Practice Providers (APPs -  Physician Assistants and Nurse Practitioners) who all work together to provide you with the care you need, when you need it.  We recommend signing up for the patient portal called "MyChart".  Sign up information is provided on this After Visit Summary.  MyChart is used to connect with patients for Virtual Visits (Telemedicine).  Patients are able to view lab/test results, encounter notes, upcoming appointments, etc.  Non-urgent messages can be sent to your provider as well.   To learn more about what you can do with MyChart, go to [email or web address].    Your next appointment:   6  month(s)  The format for your next appointment:   In Person  Provider:   Buford Dresser, MD

## 2022-01-25 NOTE — Progress Notes (Signed)
Cardiology Office Note:    Date:  01/25/2022   ID:  Shelley, Coleman 08/22/1940, MRN [id number]  PCP:  Dorothyann Peng, NP  Cardiologist:  Buford Dresser, MD  Referring MD: Dorothyann Peng, NP   CC: Follow-up  History of Present Illness:    Shelley Coleman is a 81 y.o. female with a hx of hypertension, hyperlipidemia, aortic atherosclerosis, prior CVA, history of GI bleed 10/2020 who is seen for follow-up. I initially met her 06/02/2021 as a new consult at the request of Dorothyann Peng, NP for the evaluation and management of dyspnea on exertion.  CV risk factors: Has a strong family history of CAD. Father had 92 MI's (first age 36) and 2 CVA. Both brothers have stents. Mother died of angiosarcoma at age 61.  Former [employer].  Prior to her last visit she had a cardiac/coronary CT 06/16/21, showing a coronary calcium score of 440. And an aortic valve calcium score of 153. There was concern for a significant stenosis in the mRCA, and this was borderline on FFR. She saw her gastroenterologist and had several medication changes before the visit. Her GI issues had been severe. Generally she felt very fatigued, that began as soon as she gets out of bed. She noticed some palpitations when she was in prone position. For exercise, she walked as she is able. Aside from her fatigue she had no significant exertional symptoms. Years ago she was started on nitroglycerin prn, which did use. No was no longer taking Pepcid, and when she was taking a statin, she developed severe body aches and myalgias.  Today: She states that she has a lack of energy and motivation, which has been an ongoing issue for a long time. A normal day for her is doing chores, yard work, taking care of her pets, going to the grocery store, and exercising by walking daily. She is experiencing significant stress from family problems and her Nexlizet being cost prohibitive.   Every once in a while she experiences a "pulse in my neck"  associated with some mild upper chest/neck discomfort. When this occurs she takes a nitroglycerin, which helps relieve her symptoms. Most recently this occurred at least 2 months ago.  She endorses a little bit of swelling in her feet. She also complains of a large muscle knot in the region of her right anterior tibia inferior to the knee.  At night she doesn't sleep well at all. When she is unable to fall asleep she will take 1/2 tablet of Xanax and a couple of Advil.  Additionally she is concerned about her macular degeneration and hearing loss. Her therapy includes eye injections every 3 months, and she wears hearing aids.  She denies any shortness of breath, lightheadedness, headaches, syncope, orthopnea, or PND.    Past Medical History:  Diagnosis Date   Anemia    Has had iron infusions   Breast cancer ([place])    right   Cerebrovascular disease 11/12/2018   Depression    Depression    GERD (gastroesophageal reflux disease)    Hiatal hernia    History of GI bleed    Hx of mitral valve prolapse    Hyperlipidemia    Irregular heart beat    Neuropathy    numbness in both feet   Nocturnal leg cramps 09/02/2015   Optic neuritis    left   Osteopenia    Ovarian cyst    Parotid tumor    right   Pelvic fracture ([place])  Restless legs syndrome    Shingles 2000   Left in V1   Stroke [facility])     Past Surgical History:  Procedure Laterality Date   ABDOMINAL HYSTERECTOMY     APPENDECTOMY     BREAST LUMPECTOMY Left    CATARACT EXTRACTION     ESOPHAGEAL MANOMETRY N/A 01/13/2013   Procedure: ESOPHAGEAL MANOMETRY (EM);  Surgeon: Sable Feil, MD;  Location: [facility];  Service: Endoscopy;  Laterality: N/A;   ESOPHAGOGASTRODUODENOSCOPY (EGD) WITH PROPOFOL N/A 10/30/2020   Procedure: ESOPHAGOGASTRODUODENOSCOPY (EGD) WITH PROPOFOL;  Surgeon: Mauri Pole, MD;  Location: [place] [facility];  Service: Endoscopy;  Laterality: N/A;   FOREIGN BODY REMOVAL  10/30/2020   Procedure: FOREIGN  BODY REMOVAL;  Surgeon: Mauri Pole, MD;  Location: [place];  Service: Endoscopy;;   HERNIA REPAIR  08/2009   HOT HEMOSTASIS N/A 10/30/2020   Procedure: HOT HEMOSTASIS (ARGON PLASMA COAGULATION/BICAP);  Surgeon: Mauri Pole, MD;  Location: [facility] ENDOSCOPY;  Service: Endoscopy;  Laterality: N/A;   MASTECTOMY Right    SPINE SURGERY     TOTAL HIP ARTHROPLASTY Left     Current Medications: Current Outpatient Medications on File Prior to Visit  Medication Sig   acetaminophen (TYLENOL) 500 MG tablet Take 1,000 mg by mouth every 6 (six) hours as needed for moderate pain or headache.   ALPRAZolam (XANAX) 1 MG tablet Take 0.5-1 tablets (0.5-1 mg total) by mouth 2 (two) times daily as needed for anxiety.   atenolol (TENORMIN) 50 MG tablet Take 1 tablet (50 mg total) by mouth daily.   Bempedoic Acid-Ezetimibe (NEXLIZET) 180-10 MG TABS Please take one tablet daily.   clopidogrel (PLAVIX) 75 MG tablet Take 1 tablet (75 mg total) by mouth daily.   Cyanocobalamin 3000 MCG CAPS Take 3,000 mcg by mouth in the morning and at bedtime.   ferrous sulfate 325 (65 FE) MG tablet Take 325 mg by mouth daily with breakfast.   Multiple Vitamins-Minerals (CENTRUM SILVER 50+WOMEN) TABS Take 1 tablet by mouth daily.   Multiple Vitamins-Minerals (PRESERVISION AREDS 2 PO) Take 1 tablet by mouth in the morning and at bedtime.   NONFORMULARY OR COMPOUNDED ITEM as needed. Formula 25: Doxepin 3% Amantadine 3% Dextromethorphan 2% Lidocaine 2%     Polyethyl Glycol-Propyl Glycol (SYSTANE OP) Place 1 drop into both eyes daily as needed (dry eyes).   rOPINIRole (REQUIP) 3 MG tablet Take 3 mg by mouth at bedtime as needed (rls).   pantoprazole (PROTONIX) 40 MG tablet Take 1 tablet (40 mg total) by mouth 2 (two) times daily.   No current facility-administered medications on file prior to visit.     Allergies:   Diflunisal, Lipitor [atorvastatin], Statins, and Tetanus toxoid   Social History   Tobacco Use    Smoking status: Never   Smokeless tobacco: Never  Vaping Use   Vaping Use: Never used  Substance Use Topics   Alcohol use: Yes    Alcohol/week: 1.0 standard drink of alcohol    Types: 1 Glasses of wine per week    Comment: Rarely,socially   Drug use: No    Family History: family history includes Cancer in her mother; Heart attack in her father; Heart disease in her father; Hernia in her brother; Hypertension in her brother and father; Stroke in her father. There is no history of Neuropathy, Restless legs syndrome, Colon cancer, Esophageal cancer, Pancreatic cancer, or Stomach cancer.  Father had 85 MI (first age 62) and 2 CVA. Both brothers have stents. Mother  died of angiosarcoma at age 2.   ROS:   Please see the history of present illness. (+) Fatigue/malaise (+) Stress (+) Edema of bilateral feet (+) Rare "pulse" sensation and discomfort of neck All other systems are reviewed and negative.    EKGs/Labs/Other Studies Reviewed:    The following studies were reviewed today:  Cardiac CTA 06/16/2021: FINDINGS: Scan was triggered in the descending thoracic aorta. Axial non-contrast 3 mm slices were carried out through the heart. The data set was analyzed on a dedicated work station and scored using the [place]. Gantry rotation speed was 250 msecs and collimation was .6 mm. 0.8 mg of sl NTG was given. The 3D data set was reconstructed in 5% intervals of the 67-82 % of the R-R cycle. Diastolic phases were analyzed on a dedicated work station using MPR, MIP and VRT modes. The patient received 95 cc of contrast.   Aorta:  Normal size.  Aortic atherosclerosis.  No dissection.   Main Pulmonary Artery: Normal size of the pulmonary artery.   Aortic Valve:  Tri-leaflet.  Aortic valve calcium score 153.   Coronary Arteries:  Normal coronary origin.  Right dominance.   Coronary Calcium Score:   Left main: 0   Left anterior descending artery: 266   Left circumflex  artery: 0   Right coronary artery: 174   Total: 440   Percentile: 80th for age, sex, and race matched control.   RCA is a large dominant artery that gives rise to PDA and PLA. Mild calcified plaque in the proximal RCA. Moderate soft stenosis in the mid RCA. Moderate mixed stenosis in the distal RCA. Mild mixed plaques in PDA and PLA branches.   Left main is a large artery that gives rise to LAD and LCX arteries. Mild soft plaque in the distal vessel.   LAD is a large vessel that gives rise to two diagonal vessels. Mild mixed plaque in the proximal vessel. Moderate mixed plaque in the mid vessel. Mild calcified D1 disease. Mild calcified D2 disease before vessel bifurcation.   LCX is a non-dominant artery. There is no significant plaque. Very small vessel.   Other findings:   Normal pulmonary vein drainage into the left atrium.   Normal left atrial appendage without a thrombus.   Cannot exclude small PFO.   Extra-cardiac findings: See attached radiology report for non-cardiac structures.   IMPRESSION: 1. Coronary calcium score of 440. This was 80th percentile for age, sex, and race matched control.   2. Normal coronary origin with right dominance.   3. Aortic valve calcium score 153.   4. Aortic atherosclerosis.   5. CAD-RADS 3. Moderate stenosis RCA and LAD. Consider symptom-guided anti-ischemic pharmacotherapy as well as risk factor modification per guideline directed care. Additional analysis with CT FFR will be submitted.  Echo 2017 Left ventricle: The cavity size was normal. There was mild focal    basal hypertrophy of the septum. Systolic function was normal.    The estimated ejection fraction was in the range of 55% to 60%.    Wall motion was normal; there were no regional wall motion    abnormalities. Doppler parameters are consistent with abnormal    left ventricular relaxation (grade 1 diastolic dysfunction).   Impressions:   - Normal LV systolic  function; grade 1 diastolic dysfunction;    trace TR.   EKG:  EKG is personally reviewed.   01/25/2022: EKG was not ordered. 07/25/2021: EKG was not ordered. 06/02/21: NSR at 69 bpm  Recent Labs: 06/15/2021: ALT 12; BUN 25; Creatinine, Ser 0.70; Hemoglobin 13.0; Platelets 233.0; Potassium 4.0; Sodium 139; TSH 1.45   Recent Lipid Panel    Component Value Date/Time   CHOL 263 (H) 06/15/2021 1013   TRIG 211.0 (H) 06/15/2021 1013   HDL 60.70 06/15/2021 1013   CHOLHDL 4 06/15/2021 1013   VLDL 42.2 (H) 06/15/2021 1013   LDLCALC 158 (H) 08/28/2019 1206   LDLDIRECT 185.0 06/15/2021 1013    Physical Exam:    VS:  BP 122/74 (BP Location: Left Arm, Patient Position: Sitting, Cuff Size: Large)   Pulse 65   Ht 5' 7.5" (1.715 m)   Wt 179 lb 14.4 oz (81.6 kg)   SpO2 95%   BMI 27.76 kg/m     Wt Readings from Last 3 Encounters:  01/25/22 179 lb 14.4 oz (81.6 kg)  01/19/22 174 lb (78.9 kg)  12/06/21 174 lb (78.9 kg)    GEN: Well nourished, well developed in no acute distress HEENT: Normal, moist mucous membranes NECK: No JVD CARDIAC: regular rhythm, normal S1 and S2, no rubs or gallops. 1/6 systolic murmur. VASCULAR: Radial and DP pulses 2+ bilaterally. No carotid bruits RESPIRATORY:  Clear to auscultation without rales, wheezing or rhonchi  ABDOMEN: Soft, non-tender, non-distended MUSCULOSKELETAL:  Ambulates independently SKIN: Warm and dry, no edema NEUROLOGIC:  Alert and oriented x 3. No focal neuro deficits noted. PSYCHIATRIC:  Normal affect    ASSESSMENT:    1. History of CVA (cerebrovascular accident)   2. Coronary artery disease of native artery of native heart with stable angina pectoris ([place])   3. Essential hypertension   4. Myalgia due to statin   5. Chronic fatigue   6. Medication management      PLAN:    Coronary artery disease, with borderline significant stenosis in the mRCA -based on results of CT coronary. Discussed that her fatigue with exertion may be stable  angina. Reviewed when/how to use SL NG and when to call 911 -discussed medical management, indications for cardiac cath/stent  Hypertension -typically well controlled, though when atenolol was reduced to 93.2 mg her diastolic number was over 671. Gradually increased to 50 mg daily. -has history of arrhythmia/PVC, reason atenolol was initially started, but found to have thyroid issues around the same time  History of CVA Hyperlipidemia -had myalgia on statin, entire family has issues with them -on clopidogrel -nexlizet is expensive for her. Will reach out to our pharmacy team to see if she qualifies for PCSK9i coverage given her statin intolerance and history of ASCVD.  Cardiac risk counseling and prevention recommendations: -recommend heart healthy/Mediterranean diet, with whole grains, fruits, vegetable, fish, lean meats, nuts, and olive oil. Limit salt. -recommend moderate walking, 3-5 times/week for 30-50 minutes each session. Aim for at least 150 minutes.week. Goal should be pace of 3 miles/hours, or walking 1.5 miles in 30 minutes -recommend avoidance of tobacco products. Avoid excess alcohol.  Plan for follow up: 6 months or sooner as needed.  Buford Dresser, MD, PhD, [facility]    Medication Adjustments/Labs and Tests Ordered: Current medicines are reviewed at length with the patient today.  Concerns regarding medicines are outlined above.   Orders Placed This Encounter  Procedures   Lipid panel   Meds ordered this encounter  Medications   nitroGLYCERIN (NITROSTAT) 0.4 MG SL tablet    Sig: PLACE ONE TABLET UNDER THE TONGUE EVERY 5 MINUTES AS NEEDED.    Dispense:  300 each  Refill:  0   Patient Instructions  Medication Instructions:  Your Physician recommend you continue on your current medication as directed.    *If you need a refill on your cardiac medications before your next appointment, please call your pharmacy*   Lab Work: Your  provider has recommended lab work (fasting lipid) Please have this collected at [facility] at [place]. The lab is open 8:00 am - 4:30 pm. Please avoid 12:00p - 1:00p for lunch hour. You do not need an appointment. Please go to [address] [place], [place] [address]. This is in the Primary Care office on the 3rd floor, let them know you are there for blood work and they will direct you to the lab.  If you have labs (blood work) drawn today and your tests are completely normal, you will receive your results only by: [place] (if you have MyChart) OR A paper copy in the mail If you have any lab test that is abnormal or we need to change your treatment, we will call you to review the results.   Testing/Procedures: None ordered today   Follow-Up: At [facility], you and your health needs are our priority.  As part of our continuing mission to provide you with exceptional heart care, we have created designated Provider Care Teams.  These Care Teams include your primary Cardiologist (physician) and Advanced Practice Providers (APPs -  Physician Assistants and Nurse Practitioners) who all work together to provide you with the care you need, when you need it.  We recommend signing up for the patient portal called "MyChart".  Sign up information is provided on this After Visit Summary.  MyChart is used to connect with patients for Virtual Visits (Telemedicine).  Patients are able to view lab/test results, encounter notes, upcoming appointments, etc.  Non-urgent messages can be sent to your provider as well.   To learn more about what you can do with MyChart, go to [email or web address].    Your next appointment:   6 month(s)  The format for your next appointment:   In Person  Provider:   Buford Dresser, MD             [facility],acting as a scribe for Buford Dresser, MD.,have documented all relevant documentation on the  behalf of Buford Dresser, MD,as directed by  Buford Dresser, MD while in the presence of Buford Dresser, MD.  I, Buford Dresser, MD, have reviewed all documentation for this visit. The documentation on 01/25/22 for the exam, diagnosis, procedures, and orders are all accurate and complete.   Signed, Buford Dresser, MD PhD 01/25/2022     [place]

## 2022-01-30 DIAGNOSIS — Z79899 Other long term (current) drug therapy: Secondary | ICD-10-CM | POA: Diagnosis not present

## 2022-01-31 ENCOUNTER — Telehealth: Payer: Self-pay | Admitting: Cardiology

## 2022-01-31 LAB — LIPID PANEL
Chol/HDL Ratio: 2.7 ratio (ref 0.0–4.4)
Cholesterol, Total: 155 mg/dL (ref 100–199)
HDL: 57 mg/dL (ref >39)
LDL Chol Calc (NIH): 74 mg/dL (ref 0–99)
Triglycerides: 141 mg/dL (ref 0–149)
VLDL Cholesterol Cal: 24 mg/dL (ref 5–40)

## 2022-01-31 NOTE — Telephone Encounter (Signed)
Pt c/o medication issue:  1. Name of Medication: leqvio  2. How are you currently taking this medication (dosage and times per day)?   3. Are you having a reaction (difficulty breathing--STAT)? no  4. What is your medication issue? Asencion Partridge from [employer] states the form for [employer] they received has an NPI number listed that does not match the location and name of the practice. She says the NPI number listed was: [id number] and pulls up [facility] name and address, but the name on the form was [facility] [address] She states if the office needs to use this NPI then the address and name listed on the form needs to be changed. She says once they match with what come up in their registry they can go forward with the benefits review. If there are any questions she can be reached at: [phone]

## 2022-01-31 NOTE — Telephone Encounter (Signed)
Hey looks like this is a patient of yalls, did you do this paperwork for her?

## 2022-02-02 DIAGNOSIS — H353221 Exudative age-related macular degeneration, left eye, with active choroidal neovascularization: Secondary | ICD-10-CM | POA: Diagnosis not present

## 2022-02-02 DIAGNOSIS — H353112 Nonexudative age-related macular degeneration, right eye, intermediate dry stage: Secondary | ICD-10-CM | POA: Diagnosis not present

## 2022-02-02 DIAGNOSIS — S0512XD Contusion of eyeball and orbital tissues, left eye, subsequent encounter: Secondary | ICD-10-CM | POA: Diagnosis not present

## 2022-02-02 DIAGNOSIS — Z961 Presence of intraocular lens: Secondary | ICD-10-CM | POA: Diagnosis not present

## 2022-02-02 DIAGNOSIS — H43813 Vitreous degeneration, bilateral: Secondary | ICD-10-CM | POA: Diagnosis not present

## 2022-02-02 NOTE — Telephone Encounter (Signed)
Information corrected with Marion Downer

## 2022-02-07 ENCOUNTER — Other Ambulatory Visit: Payer: Self-pay | Admitting: Pharmacist Clinician (PhC)/ Clinical Pharmacy Specialist

## 2022-02-07 DIAGNOSIS — I25118 Atherosclerotic heart disease of native coronary artery with other forms of angina pectoris: Secondary | ICD-10-CM

## 2022-02-07 MED ORDER — NITROGLYCERIN 0.4 MG SL SUBL: SUBLINGUAL_TABLET | SUBLINGUAL | 12 refills | Status: DC

## 2022-02-07 NOTE — Progress Notes (Signed)
Leqvio orders placed

## 2022-02-08 ENCOUNTER — Telehealth: Payer: Self-pay | Admitting: Pharmacy Technician

## 2022-02-08 NOTE — Telephone Encounter (Addendum)
Dr.  Harrell Gave, Shelley Coleman note:  Auth Submission: NO AUTH NEEDED Payer: MEDICARE A/B and AARP Medication & CPT/J Code(s) submitted: Leqvio ([place]) [phone] Route of submission (phone, fax, portal):  Phone # Fax # Auth type: Buy/Bill Units/visits requested: X3 DOSES Reference numberLynda Coleman- [id number] Approval from: 02/08/22 to 04/30/22  Medicare will cover 80% and AARP will pick-up remaining 20%. Patient has met deductible.  Patient will scheduled as soon as possible.  Medicare and supplement coverage reviewed and approval extended to 03/01/23

## 2022-02-16 ENCOUNTER — Ambulatory Visit (INDEPENDENT_AMBULATORY_CARE_PROVIDER_SITE_OTHER): Payer: Medicare Other

## 2022-02-16 VITALS — BP 110/65 | HR 57 | Temp 97.6°F | Resp 18 | Ht 67.0 in | Wt 176.8 lb

## 2022-02-16 DIAGNOSIS — E782 Mixed hyperlipidemia: Secondary | ICD-10-CM

## 2022-02-16 MED ORDER — INCLISIRAN SODIUM 284 MG/1.5ML ~~LOC~~ SOSY
284.0000 mg | PREFILLED_SYRINGE | Freq: Once | SUBCUTANEOUS | Status: AC
  Administered 2022-02-16: 284 mg via SUBCUTANEOUS
  Filled 2022-02-16: qty 1.5

## 2022-02-16 NOTE — Progress Notes (Signed)
Diagnosis: Hyperlipidemia  Provider:  Marshell Garfinkel MD  Procedure: Injection  Leqvio (inclisiran), Dose: 284 mg, Site: subcutaneous, Number of injections: 1  Post Care: Observation period completed  Discharge: Condition: Good, Destination: Home . AVS provided to patient.   Performed by:  Cleophus Molt, RN

## 2022-02-27 ENCOUNTER — Encounter (HOSPITAL_BASED_OUTPATIENT_CLINIC_OR_DEPARTMENT_OTHER): Payer: Self-pay | Admitting: Cardiology

## 2022-03-06 ENCOUNTER — Telehealth: Payer: Self-pay | Admitting: Gastroenterology

## 2022-03-06 NOTE — Telephone Encounter (Signed)
Inbound call from patient requesting an apt due to her having really black stool. Patient also has been experiencing abdominal pain. Patient scheduled for 12/12 with a pa. Please advise.  Thank you

## 2022-03-07 NOTE — Telephone Encounter (Signed)
The pt had 1 episode of epigastric pain last week then 2 days of abd cramping and black stools.  The stools have returned to normal but she continues to have reflux, and epigastric pain and abd burning.  Her appt with Jaclyn Shaggy was not until Dec. She does not feel she can wait for that appt.  Anderson Malta has a cancellation for 11/10 at 330 so she was moved to that appt.  She does have a history of IDA, and upper GI bleed.  She will go to the ED if she develops obvious bleeding or worse pain.

## 2022-03-10 ENCOUNTER — Encounter: Payer: Self-pay | Admitting: Physician Assistant

## 2022-03-10 ENCOUNTER — Ambulatory Visit (INDEPENDENT_AMBULATORY_CARE_PROVIDER_SITE_OTHER): Payer: Medicare Other | Admitting: Physician Assistant

## 2022-03-10 ENCOUNTER — Other Ambulatory Visit (INDEPENDENT_AMBULATORY_CARE_PROVIDER_SITE_OTHER): Payer: Medicare Other

## 2022-03-10 VITALS — BP 118/74 | HR 61 | Ht 67.0 in | Wt 181.0 lb

## 2022-03-10 DIAGNOSIS — I25118 Atherosclerotic heart disease of native coronary artery with other forms of angina pectoris: Secondary | ICD-10-CM | POA: Diagnosis not present

## 2022-03-10 DIAGNOSIS — K259 Gastric ulcer, unspecified as acute or chronic, without hemorrhage or perforation: Secondary | ICD-10-CM | POA: Diagnosis not present

## 2022-03-10 DIAGNOSIS — R1013 Epigastric pain: Secondary | ICD-10-CM

## 2022-03-10 DIAGNOSIS — K921 Melena: Secondary | ICD-10-CM | POA: Diagnosis not present

## 2022-03-10 DIAGNOSIS — R5383 Other fatigue: Secondary | ICD-10-CM

## 2022-03-10 LAB — CBC WITH DIFFERENTIAL/PLATELET
Basophils Absolute: 0 10*3/uL (ref 0.0–0.1)
Basophils Relative: 0.8 % (ref 0.0–3.0)
Eosinophils Absolute: 0.1 10*3/uL (ref 0.0–0.7)
Eosinophils Relative: 1.7 % (ref 0.0–5.0)
HCT: 35 % — ABNORMAL LOW (ref 36.0–46.0)
Hemoglobin: 11.7 g/dL — ABNORMAL LOW (ref 12.0–15.0)
Lymphocytes Relative: 29.4 % (ref 12.0–46.0)
Lymphs Abs: 1.8 10*3/uL (ref 0.7–4.0)
MCHC: 33.4 g/dL (ref 30.0–36.0)
MCV: 85.1 fl (ref 78.0–100.0)
Monocytes Absolute: 0.5 10*3/uL (ref 0.1–1.0)
Monocytes Relative: 8.7 % (ref 3.0–12.0)
Neutro Abs: 3.6 10*3/uL (ref 1.4–7.7)
Neutrophils Relative %: 59.4 % (ref 43.0–77.0)
Platelets: 273 10*3/uL (ref 150.0–400.0)
RBC: 4.11 Mil/uL (ref 3.87–5.11)
RDW: 14.9 % (ref 11.5–15.5)
WBC: 6.1 10*3/uL (ref 4.0–10.5)

## 2022-03-10 LAB — IBC + FERRITIN
Ferritin: 6.3 ng/mL — ABNORMAL LOW (ref 10.0–291.0)
Iron: 28 ug/dL — ABNORMAL LOW (ref 42–145)
Saturation Ratios: 6.2 % — ABNORMAL LOW (ref 20.0–50.0)
TIBC: 455 ug/dL — ABNORMAL HIGH (ref 250.0–450.0)
Transferrin: 325 mg/dL (ref 212.0–360.0)

## 2022-03-10 LAB — COMPREHENSIVE METABOLIC PANEL
ALT: 18 U/L (ref 0–35)
AST: 21 U/L (ref 0–37)
Albumin: 4.1 g/dL (ref 3.5–5.2)
Alkaline Phosphatase: 85 U/L (ref 39–117)
BUN: 20 mg/dL (ref 6–23)
CO2: 31 mEq/L (ref 19–32)
Calcium: 9.3 mg/dL (ref 8.4–10.5)
Chloride: 107 mEq/L (ref 96–112)
Creatinine, Ser: 0.69 mg/dL (ref 0.40–1.20)
GFR: 82.39 mL/min (ref >60.00)
Glucose, Bld: 93 mg/dL (ref 70–99)
Potassium: 4.7 mEq/L (ref 3.5–5.1)
Sodium: 142 mEq/L (ref 135–145)
Total Bilirubin: 0.2 mg/dL (ref 0.2–1.2)
Total Protein: 6.9 g/dL (ref 6.0–8.3)

## 2022-03-10 MED ORDER — SUCRALFATE 1 G PO TABS: 1.0000 g | ORAL_TABLET | Freq: Four times a day (QID) | ORAL | 0 refills | Status: DC

## 2022-03-10 NOTE — Patient Instructions (Addendum)
_______________________________________________________  If you are age 79 or older, your body mass index should be between 23-30. Your Body mass index is 28.35 kg/m. If this is out of the aforementioned range listed, please consider follow up with your Primary Care Provider.  If you are age 3 or younger, your body mass index should be between 19-25. Your Body mass index is 28.35 kg/m. If this is out of the aformentioned range listed, please consider follow up with your Primary Care Provider.   ________________________________________________________  The [place] [facility] providers would like to encourage you to use [facility] to communicate with providers for non-urgent requests or questions.  Due to long hold times on the telephone, sending your provider a message by [facility] may be a faster and more efficient way to get a response.  Please allow 48 business hours for a response.  Please remember that this is for non-urgent requests.   Your provider has requested that you go to the basement level for lab work before leaving today. Press "B" on the elevator. The lab is located at the first door on the left as you exit the elevator.   We have sent the following medications to your pharmacy for you to pick up at your convenience:  Start Carafate 1 gm 4 times a day  Thank you for entrusting me with your care and choosing [facility].  Ellouise Newer PA-C

## 2022-03-10 NOTE — Progress Notes (Signed)
Chief Complaint: Abdominal pain and black stool  HPI:    Shelley Coleman is a 81 year old female with a past medical history as listed below including breast cancer, stroke on Plavix, GERD, history of GI bleed and multiple others, known to Dr. Ardis Hughs previously, who was referred to me by Dorothyann Peng, NP for a complaint of abdominal pain and black stool.      10/30/2020 EGD with Dr. Silverio Decamp at the hospital for GI bleed with large hiatal hernia, food in the cardia, gastric Cameron erosions with no bleeding and a single angiectasia in the duodenum treated with APC.  Patient told to stay on Pantoprazole 40 mg p.o. twice daily for 3 months and then once daily.    06/15/2021 iron studies with a low ferritin at 8.5% saturation 24%.  CBC with normal hemoglobin.    07/20/2021 colonoscopy with internal hemorrhoids and otherwise normal.  No further repeat needed due to age.    03/07/2022 patient called and describes she had 1 episode of epigastric pain last week and then 2 days of abdominal cramping and black stools.  The stools are back to normal but she continued with reflux, epigastric pain abdominal burning.    Today, the patient explains that she has constant trouble with indigestion even though she apparently had a hiatal hernia repair years and years ago.  She has never been off of antacids.  Currently on Pantoprazole 40 mg twice a day which she has been on since hospitalization for upper GI bleed last summer.  Tells me occasionally helps but she constantly has to watch things and often uses baking soda for breakthrough.  Sometimes she will get severe esophageal pain which is "quite disturbing", last week she had a severe episode of pain and then the next days had a stool that was black.  She had 5 more after that that were that were black and in total it took 3 days for this to stop.  Tells me she just in general does not feel good and feels like she does not have as much energy as normal even when she is just  standing at the sink brushing her hair.  Denies palpitations, chest pain or shortness of breath.    Denies fever, chills, weight loss, nausea or vomiting.  Past Medical History:  Diagnosis Date   Anemia    Has had iron infusions   Breast cancer ([place])    right   Cerebrovascular disease 11/12/2018   Depression    Depression    GERD (gastroesophageal reflux disease)    Hiatal hernia    History of GI bleed    Hx of mitral valve prolapse    Hyperlipidemia    Irregular heart beat    Neuropathy    numbness in both feet   Nocturnal leg cramps 09/02/2015   Optic neuritis    left   Osteopenia    Ovarian cyst    Parotid tumor    right   Pelvic fracture (HCC)    Restless legs syndrome    Shingles 2000   Left in V1   Stroke [facility])     Past Surgical History:  Procedure Laterality Date   ABDOMINAL HYSTERECTOMY     APPENDECTOMY     BREAST LUMPECTOMY Left    CATARACT EXTRACTION     ESOPHAGEAL MANOMETRY N/A 01/13/2013   Procedure: ESOPHAGEAL MANOMETRY (EM);  Surgeon: Sable Feil, MD;  Location: [facility];  Service: Endoscopy;  Laterality: N/A;   ESOPHAGOGASTRODUODENOSCOPY (EGD) WITH  PROPOFOL N/A 10/30/2020   Procedure: ESOPHAGOGASTRODUODENOSCOPY (EGD) WITH PROPOFOL;  Surgeon: Mauri Pole, MD;  Location: [place] [facility];  Service: Endoscopy;  Laterality: N/A;   FOREIGN BODY REMOVAL  10/30/2020   Procedure: FOREIGN BODY REMOVAL;  Surgeon: Mauri Pole, MD;  Location: [place];  Service: Endoscopy;;   HERNIA REPAIR  08/2009   HOT HEMOSTASIS N/A 10/30/2020   Procedure: HOT HEMOSTASIS (ARGON PLASMA COAGULATION/BICAP);  Surgeon: Mauri Pole, MD;  Location: [facility]COPY;  Service: Endoscopy;  Laterality: N/A;   MASTECTOMY Right    SPINE SURGERY     TOTAL HIP ARTHROPLASTY Left     Current Outpatient Medications  Medication Sig Dispense Refill   acetaminophen (TYLENOL) 500 MG tablet Take 1,000 mg by mouth every 6 (six) hours as needed for moderate pain or headache.      ALPRAZolam (XANAX) 1 MG tablet Take 0.5-1 tablets (0.5-1 mg total) by mouth 2 (two) times daily as needed for anxiety. 30 tablet 1   atenolol (TENORMIN) 50 MG tablet Take 1 tablet (50 mg total) by mouth daily. 90 tablet 3   Bempedoic Acid-Ezetimibe (NEXLIZET) 180-10 MG TABS Please take one tablet daily. 90 tablet 3   clopidogrel (PLAVIX) 75 MG tablet Take 1 tablet (75 mg total) by mouth daily. 90 tablet 3   Cyanocobalamin 3000 MCG CAPS Take 3,000 mcg by mouth in the morning and at bedtime.     ferrous sulfate 325 (65 FE) MG tablet Take 325 mg by mouth daily with breakfast.     Multiple Vitamins-Minerals (CENTRUM SILVER 50+WOMEN) TABS Take 1 tablet by mouth daily.     Multiple Vitamins-Minerals (PRESERVISION AREDS 2 PO) Take 1 tablet by mouth in the morning and at bedtime.     nitroGLYCERIN (NITROSTAT) 0.4 MG SL tablet PLACE ONE TABLET UNDER THE TONGUE EVERY 5 MINUTES AS NEEDED. 25 tablet 12   NONFORMULARY OR COMPOUNDED ITEM as needed. Formula 25: Doxepin 3% Amantadine 3% Dextromethorphan 2% Lidocaine 2%       pantoprazole (PROTONIX) 40 MG tablet Take 1 tablet (40 mg total) by mouth 2 (two) times daily. 60 tablet 11   Polyethyl Glycol-Propyl Glycol (SYSTANE OP) Place 1 drop into both eyes daily as needed (dry eyes).     rOPINIRole (REQUIP) 3 MG tablet Take 3 mg by mouth at bedtime as needed (rls).     No current facility-administered medications for this visit.    Allergies as of 03/10/2022 - Review Complete 02/27/2022  Allergen Reaction Noted   Diflunisal  03/07/2007   Lipitor [atorvastatin]  09/13/2016   Statins  03/13/2018   Tetanus toxoid  03/07/2007    Family History  Problem Relation Age of Onset   Cancer Mother        Angio sarcoma    Heart disease Father    Hypertension Father    Stroke Father        x2   Heart attack Father        x 11   Hernia Brother    Hypertension Brother    Neuropathy Neg Hx    Restless legs syndrome Neg Hx    Colon cancer Neg Hx     Esophageal cancer Neg Hx    Pancreatic cancer Neg Hx    Stomach cancer Neg Hx     Social History   Socioeconomic History   Marital status: Married    Spouse name: Not on file   Number of children: 2   Years of education: 91  Highest education level: Not on file  Occupational History   Occupation: Retired Therapist, sports  Tobacco Use   Smoking status: Never   Smokeless tobacco: Never  Vaping Use   Vaping Use: Never used  Substance and Sexual Activity   Alcohol use: Yes    Alcohol/week: 1.0 standard drink of alcohol    Types: 1 Glasses of wine per week    Comment: Rarely,socially   Drug use: No   Sexual activity: Not on file  Other Topics Concern   Not on file  Social History Narrative   Retired from nursing    Widowed    Left-handed      Social Determinants of Health   Financial Resource Strain: Low Risk  (01/19/2022)   Overall Financial Resource Strain (CARDIA)    Difficulty of Paying Living Expenses: Not hard at all  Food Insecurity: No Food Insecurity (01/19/2022)   Hunger Vital Sign    Worried About Running Out of Food in the Last Year: Never true    [place] in the Last Year: Never true  Transportation Needs: No Transportation Needs (01/19/2022)   PRAPARE - Hydrologist (Medical): No    Lack of Transportation (Non-Medical): No  Physical Activity: Inactive (01/19/2022)   Exercise Vital Sign    Days of Exercise per Week: 0 days    Minutes of Exercise per Session: 0 min  Stress: No Stress Concern Present (01/19/2022)   [place]    Feeling of Stress : Not at all  Social Connections: Moderately Integrated (01/19/2022)   Social Connection and Isolation Panel [NHANES]    Frequency of Communication with Friends and Family: More than three times a week    Frequency of Social Gatherings with Friends and Family: More than three times a week    Attends Religious Services: More than  4 times per year    Active Member of [employer] or Organizations: Yes    Attends Archivist Meetings: More than 4 times per year    Marital Status: Widowed  Intimate Partner Violence: Not At Risk (01/19/2022)   Humiliation, Afraid, Rape, and Kick questionnaire    Fear of Current or Ex-Partner: No    Emotionally Abused: No    Physically Abused: No    Sexually Abused: No    Review of Systems:    Constitutional: No weight loss, fever or chills Skin: No rash  Cardiovascular: +atypical chest pain Respiratory: No SOB  Gastrointestinal: See HPI and otherwise negative Genitourinary: No dysuria  Neurological: No headache, dizziness or syncope Musculoskeletal: No new muscle or joint pain Hematologic: No  bruising Psychiatric: No history of depression or anxiety   Physical Exam:  Vital signs: BP 118/74   Pulse 61   Ht '5\' 7"'$  (1.702 m)   Wt 181 lb (82.1 kg)   BMI 28.35 kg/m    Constitutional:   Pleasant Elderly Caucasian female appears to be in NAD, Well developed, Well nourished, alert and cooperative Respiratory: Respirations even and unlabored. Lungs clear to auscultation bilaterally.   No wheezes, crackles, or rhonchi.  Cardiovascular: Normal S1, S2. No MRG. Regular rate and rhythm. No peripheral edema, cyanosis or pallor.  Gastrointestinal:  Soft, nondistended, nontender. No rebound or guarding. Normal bowel sounds. No appreciable masses or hepatomegaly. Rectal:  Not performed.  Psychiatric: Demonstrates good judgement and reason without abnormal affect or behaviors.  RELEVANT LABS AND IMAGING: CBC    Component Value Date/Time  WBC 4.7 06/15/2021 1013   RBC 4.61 06/15/2021 1013   HGB 13.0 06/15/2021 1013   HGB 13.7 06/27/2016 1252   HCT 40.1 06/15/2021 1013   HCT 41.1 06/27/2016 1252   PLT 233.0 06/15/2021 1013   PLT 228 06/27/2016 1252   MCV 87.1 06/15/2021 1013   MCV 88 06/27/2016 1252   MCH 22.9 (L) 11/01/2020 0130   MCHC 32.4 06/15/2021 1013   RDW 14.4  06/15/2021 1013   RDW 14.0 06/27/2016 1252   LYMPHSABS 1.4 06/15/2021 1013   LYMPHSABS 1.6 06/27/2016 1252   MONOABS 0.6 06/15/2021 1013   EOSABS 0.1 06/15/2021 1013   EOSABS 0.2 06/27/2016 1252   BASOSABS 0.0 06/15/2021 1013   BASOSABS 0.0 06/27/2016 1252    CMP     Component Value Date/Time   NA 139 06/15/2021 1013   K 4.0 06/15/2021 1013   CL 104 06/15/2021 1013   CO2 28 06/15/2021 1013   GLUCOSE 78 06/15/2021 1013   BUN 25 (H) 06/15/2021 1013   CREATININE 0.70 06/15/2021 1013   CREATININE 0.77 04/28/2016 1552   CALCIUM 9.6 06/15/2021 1013   PROT 7.5 06/15/2021 1013   ALBUMIN 4.1 06/15/2021 1013   AST 16 06/15/2021 1013   ALT 12 06/15/2021 1013   ALKPHOS 76 06/15/2021 1013   BILITOT 0.5 06/15/2021 1013   GFRNONAA >60 10/31/2020 0205   GFRAA >60 11/02/2015 0524    Assessment: 1.  Melena: 3 days of melena last week preceded by epigastric pain, history of upper GI bleed in the past related to [facility] erosions and AVMs in the duodenum; consider Cameron erosions +/- AVMs +/- esophagitis 2.  Epigastric/esophageal pain: With above 3.  History of Cameron erosions and AVM in the duodenum 4.  Chronic anticoagulation on Plavix for stroke  Plan: 1.  Check CBC, CMP and iron studies with ferritin today. 2.  Continue Pantoprazole 40 mg twice daily, 30-60 minutes before breakfast and dinner.  #60 with 5 refills. 3.  Start Carafate 1 g 4 times daily, an hour before or 2 hours after other medications.  #120 with 2 refills. 4.  Discussed with patient that she will likely need an EGD for further evaluation.  Pending labs above will decide whether this is best done at the hospital emergently or if we can schedule in the [facility].  She is on Plavix which would need to be held and confirmed with her prescribing physician. 5.  Patient to follow in clinic with [place] per recommendations after labs above.  Note was sent to Dr. Havery Moros in lieu of Dr. Ardis Hughs absence.  Did discuss with patient that if  she has any increase in symptoms or worsening symptoms over the weekend she needs to proceed to the ER.  Ellouise Newer, PA-C [facility] 03/10/2022, 3:25 PM  Cc: Dorothyann Peng, NP

## 2022-03-10 NOTE — Progress Notes (Signed)
Agree with assessment and plan as outlined. Agree with checking labs for now and continuing protonix. She recently had a colonoscopy this year which was normal. History of Cameron lesions, her symptoms could have been due to that and large HH.  Let's see what her labs show, if no further bleeding continue Plavix for now. If any recurrence of bleeding over the weekend or significant anemia she should go to the hospital.  If she is otherwise stable and no significant anemia or recurrent bleeding, I think a diagnostic EGD at the [facility] may be reasonable on Plavix to further evaluate this, assess hiatal hernia for [facility] lesions, and recurrent AVMs. Hopefully we may have room to do an exam this week in the [place] if she is otherwise stable from cardiopulmonary standpoint. Again if any recurrent bleeding in the interim she should go to the hospital. Of note, carafate may make her stools dark and confound her presentation.  Jennifer let's chat about where / when we can add her case for an EGD.

## 2022-03-13 ENCOUNTER — Other Ambulatory Visit: Payer: Self-pay

## 2022-03-13 DIAGNOSIS — D500 Iron deficiency anemia secondary to blood loss (chronic): Secondary | ICD-10-CM

## 2022-03-13 DIAGNOSIS — K921 Melena: Secondary | ICD-10-CM

## 2022-03-13 DIAGNOSIS — R1013 Epigastric pain: Secondary | ICD-10-CM

## 2022-03-13 DIAGNOSIS — K259 Gastric ulcer, unspecified as acute or chronic, without hemorrhage or perforation: Secondary | ICD-10-CM

## 2022-03-13 DIAGNOSIS — R5383 Other fatigue: Secondary | ICD-10-CM

## 2022-03-16 ENCOUNTER — Encounter: Payer: Self-pay | Admitting: Certified Registered Nurse Anesthetist

## 2022-03-17 ENCOUNTER — Ambulatory Visit (AMBULATORY_SURGERY_CENTER): Payer: Medicare Other | Admitting: Gastroenterology

## 2022-03-17 ENCOUNTER — Other Ambulatory Visit: Payer: Medicare Other

## 2022-03-17 ENCOUNTER — Encounter: Payer: Self-pay | Admitting: Gastroenterology

## 2022-03-17 ENCOUNTER — Other Ambulatory Visit: Payer: Self-pay

## 2022-03-17 VITALS — BP 131/80 | HR 87 | Temp 98.0°F | Resp 13 | Ht 67.0 in | Wt 181.0 lb

## 2022-03-17 DIAGNOSIS — K269 Duodenal ulcer, unspecified as acute or chronic, without hemorrhage or perforation: Secondary | ICD-10-CM

## 2022-03-17 DIAGNOSIS — K921 Melena: Secondary | ICD-10-CM

## 2022-03-17 DIAGNOSIS — K31819 Angiodysplasia of stomach and duodenum without bleeding: Secondary | ICD-10-CM | POA: Diagnosis not present

## 2022-03-17 DIAGNOSIS — Z7902 Long term (current) use of antithrombotics/antiplatelets: Secondary | ICD-10-CM

## 2022-03-17 DIAGNOSIS — D649 Anemia, unspecified: Secondary | ICD-10-CM

## 2022-03-17 DIAGNOSIS — K209 Esophagitis, unspecified without bleeding: Secondary | ICD-10-CM | POA: Diagnosis not present

## 2022-03-17 DIAGNOSIS — R1013 Epigastric pain: Secondary | ICD-10-CM | POA: Diagnosis not present

## 2022-03-17 DIAGNOSIS — K319 Disease of stomach and duodenum, unspecified: Secondary | ICD-10-CM | POA: Diagnosis not present

## 2022-03-17 DIAGNOSIS — K449 Diaphragmatic hernia without obstruction or gangrene: Secondary | ICD-10-CM

## 2022-03-17 DIAGNOSIS — K219 Gastro-esophageal reflux disease without esophagitis: Secondary | ICD-10-CM | POA: Diagnosis not present

## 2022-03-17 MED ORDER — SODIUM CHLORIDE 0.9 % IV SOLN: 500.0000 mL | Freq: Once | INTRAVENOUS | Status: DC

## 2022-03-17 MED ORDER — PANTOPRAZOLE SODIUM 40 MG PO TBEC: 80.0000 mg | DELAYED_RELEASE_TABLET | Freq: Two times a day (BID) | ORAL | 0 refills | Status: DC

## 2022-03-17 NOTE — Progress Notes (Signed)
Pt's states no medical or surgical changes since previsit or office visit. 

## 2022-03-17 NOTE — Patient Instructions (Addendum)
Handout on hiatal hernia given to you  Await pathology results  Resume Pantoprazole '80mg'$  twice a day for one month Continue Carafate  NO ASPIRIN, ASPIRIN CONTAINING PRODUCTS (BC OR GOODY POWDERS) OR NSAIDS (IBUPROFEN, ADVIL, ALEVE, AND MOTRIN); TYLENOL IS OK TO TAKE Resume Plavix on Monday if no further bleeding   YOU HAD AN ENDOSCOPIC PROCEDURE TODAY AT [place]:   Refer to the procedure report that was given to you for any specific questions about what was found during the examination.  If the procedure report does not answer your questions, please call your gastroenterologist to clarify.  If you requested that your care partner not be given the details of your procedure findings, then the procedure report has been included in a sealed envelope for you to review at your convenience later.  YOU SHOULD EXPECT: Some feelings of bloating in the abdomen. Passage of more gas than usual.  Walking can help get rid of the air that was put into your GI tract during the procedure and reduce the bloating. If you had a lower endoscopy (such as a colonoscopy or flexible sigmoidoscopy) you may notice spotting of blood in your stool or on the toilet paper. If you underwent a bowel prep for your procedure, you may not have a normal bowel movement for a few days.  Please Note:  You might notice some irritation and congestion in your nose or some drainage.  This is from the oxygen used during your procedure.  There is no need for concern and it should clear up in a day or so.  SYMPTOMS TO REPORT IMMEDIATELY:  Following upper endoscopy (EGD)  Vomiting of blood or coffee ground material  New chest pain or pain under the shoulder blades  Painful or persistently difficult swallowing  New shortness of breath  Fever of 100F or higher  Black, tarry-looking stools  For urgent or emergent issues, a gastroenterologist can be reached at any hour by calling [phone]. Do not use MyChart  messaging for urgent concerns.    DIET:  We do recommend a small meal at first, but then you may proceed to your regular diet.  Drink plenty of fluids but you should avoid alcoholic beverages for 24 hours.  ACTIVITY:  You should plan to take it easy for the rest of today and you should NOT DRIVE or use heavy machinery until tomorrow (because of the sedation medicines used during the test).    FOLLOW UP: Our staff will call the number listed on your records the next business day following your procedure.  We will call around 7:15- 8:00 am to check on you and address any questions or concerns that you may have regarding the information given to you following your procedure. If we do not reach you, we will leave a message.     If any biopsies were taken you will be contacted by phone or by letter within the next 1-3 weeks.  Please call us at [phone] if you have not heard about the biopsies in 3 weeks.    SIGNATURES/CONFIDENTIALITY: You and/or your care partner have signed paperwork which will be entered into your electronic medical record.  These signatures attest to the fact that that the information above on your After Visit Summary has been reviewed and is understood.  Full responsibility of the confidentiality of this discharge information lies with you and/or your care-partner.

## 2022-03-17 NOTE — Progress Notes (Signed)
History and Physical Interval Note: Seen on 03/10/22 by Shelley Coleman - history of dark stools x 1 episode, prior history of GI bleeding, on Plavix. Hgb drifted to 11s but no further bleeding symptoms, she self discontinued Plavix. EGD to further evaluate. History of HH with Cameron lesions, AVMs. Of note, sounds like she got confused and stopped her protonix at clinic visit, was supposed to continue BID but has been taking carafate. No further bleeding symptoms. Is on Avastin injections into her eye for macular degeneration, last given over a month ago. Have discussed risks / benefits she wishes to proceed.  03/17/2022 7:28 AM  Shelley Coleman  has presented today for endoscopic procedure(s), with the diagnosis of  Encounter Diagnoses  Name Primary?   Melena Yes   Anemia, unspecified type    Antiplatelet or antithrombotic long-term use   .  The various methods of evaluation and treatment have been discussed with the patient and/or family. After consideration of risks, benefits and other options for treatment, the patient has consented to  the endoscopic procedure(s).   The patient's history has been reviewed, patient examined, no change in status, stable for surgery.  I have reviewed the patient's chart and labs.  Questions were answered to the patient's satisfaction.    Jolly Mango, MD [facility]

## 2022-03-17 NOTE — Progress Notes (Signed)
Report given to PACU, vss 

## 2022-03-17 NOTE — Progress Notes (Signed)
0743 HR > 100 with esmolol 25 mg given IV, MD updated, vss

## 2022-03-17 NOTE — Progress Notes (Signed)
Called to room to assist during endoscopic procedure.  Patient ID and intended procedure confirmed with present staff. Received instructions for my participation in the procedure from the performing physician.  

## 2022-03-17 NOTE — Progress Notes (Signed)
0731 Robinul 0.1 mg IV given due large amount of secretions upon assessment.  MD made aware, vss

## 2022-03-17 NOTE — Op Note (Addendum)
[place] Patient Name: Shelley Coleman Procedure Date: 03/17/2022 7:26 AM MRN: [id number] Endoscopist: Remo Lipps P. Havery Moros , MD, [id number] Age: 81 Referring MD:  Date of Birth: [birth year] Gender: Female Account #: [id number] Procedure:                Upper GI endoscopy Indications:              Melena / dark stools, in the setting of Plavix use,                            and ocular Avastin for macular degeneration - mild                            decrease in Hgb, bleeding symptoms have stopped                            after holding Plavix. On protonix at baseline,                            accidentally held this since 11/10 Medicines:                Monitored Anesthesia Care Procedure:                Pre-Anesthesia Assessment:                           - Prior to the procedure, a History and Physical                            was performed, and patient medications and                            allergies were reviewed. The patient's tolerance of                            previous anesthesia was also reviewed. The risks                            and benefits of the procedure and the sedation                            options and risks were discussed with the patient.                            All questions were answered, and informed consent                            was obtained. Prior Anticoagulants: The patient has                            taken no anticoagulant or antiplatelet agents. ASA                            Grade Assessment: III - A patient with severe  systemic disease. After reviewing the risks and                            benefits, the patient was deemed in satisfactory                            condition to undergo the procedure.                           After obtaining informed consent, the endoscope was                            passed under direct vision. Throughout the                            procedure, the  patient's blood pressure, pulse, and                            oxygen saturations were monitored continuously. The                            GIF [id number] #[id number] was introduced through the                            mouth, and advanced to the duodenal bulb. The upper                            GI endoscopy was accomplished without difficulty.                            The patient tolerated the procedure well. Scope In: Scope Out: Findings:                 Esophagogastric landmarks were identified: the                            Z-line was found at 31 cm, the gastroesophageal                            junction was found at 31 cm and the upper extent of                            the gastric folds was found at 37 cm from the                            incisors.                           A 6 cm hiatal hernia was present.                           LA Grade B esophagitis was found.                           The exam  of the esophagus was otherwise normal.                           The entire examined stomach was normal. Biopsies                            were taken with a cold forceps for Helicobacter                            pylori testing.                           Few non-bleeding superficial small duodenal ulcers                            / erosions with no stigmata of bleeding were found                            in the distal duodenal bulb and sweep                           A diverticulum was found in the second portion of                            the duodenum.                           A single diminutive angiodysplastic lesion without                            bleeding was found in the second portion of the                            duodenum.                           The exam of the duodenum was otherwise normal. Complications:            No immediate complications. Estimated blood loss:                            Minimal. Estimated Blood Loss:     Estimated blood loss  was minimal. Impression:               - Esophagogastric landmarks identified.                           - 6 cm hiatal hernia.                           - LA Grade B reflux esophagitis.                           - Normal stomach. Biopsied for H pylori given  duodenal bulb findings.                           - Non-bleeding small duodenal ulcers / erosions                            with no stigmata of bleeding.                           - Duodenal diverticulum.                           - A single non-bleeding angiodysplastic lesion in                            the duodenum.                           - Otherwise normal duodenum.                           No high risk stigmata for bleeding - unclear if                            symptoms came from esophagitis / duodenal                            ulcerations (seems most likely), less likely AVM. Recommendation:           - Patient has a contact number available for                            emergencies. The signs and symptoms of potential                            delayed complications were discussed with the                            patient. Return to normal activities tomorrow.                            Written discharge instructions were provided to the                            patient.                           - Resume previous diet.                           - Continue present medications.                           - Resume protonix at '80mg'$  BID (given this occured                            on '40mg'$  BID dosing) for 1  month                           - Continue Carafate                           - Resume Plavix on Monday if no further bleeding                            over the weekend (once back on protonix for a few                            days)                           - Go to the lab to check gastrin level                           - Await pathology results.                           - Talk  with your physician about further Avastin                            use (unclear if ocular use would have systemic                            risks of bleeding) - not due for another dose until                            January                           - Monitor for recurrent bleeding, if this occurs                            would need to go to the hospital [place]. Neha Waight, MD 03/17/2022 8:01:25 AM This report has been signed electronically.

## 2022-03-20 ENCOUNTER — Other Ambulatory Visit (HOSPITAL_COMMUNITY): Payer: Self-pay

## 2022-03-20 ENCOUNTER — Encounter: Payer: Self-pay | Admitting: Cardiology

## 2022-03-20 ENCOUNTER — Telehealth: Payer: Self-pay

## 2022-03-20 NOTE — Telephone Encounter (Signed)
  Follow up Call-     03/17/2022    7:04 AM 07/20/2021    2:17 PM  Call back number  Post procedure Call Back phone  # [phone]  Permission to leave phone message Yes Yes     Patient questions:  Do you have a fever, pain , or abdominal swelling? No. Pain Score  0 *  Have you tolerated food without any problems? Yes.    Have you been able to return to your normal activities? Yes.    Do you have any questions about your discharge instructions: Diet   No. Medications  No. Follow up visit  No.  Do you have questions or concerns about your Care? No.  Actions: * If pain score is 4 or above: No action needed, pain <4.

## 2022-03-22 LAB — GASTRIN: Gastrin: 45 pg/mL (ref <=100)

## 2022-03-24 ENCOUNTER — Other Ambulatory Visit (HOSPITAL_COMMUNITY): Payer: Self-pay

## 2022-03-24 ENCOUNTER — Telehealth: Payer: Self-pay | Admitting: Pharmacy Technician

## 2022-03-24 NOTE — Telephone Encounter (Signed)
Patient Advocate Encounter  Received notification from [facility] that prior authorization for PANTOPRAZOLE '40MG'$  is required.   PA NOT SUBMITTED Key [id number] Status is pending

## 2022-03-28 ENCOUNTER — Other Ambulatory Visit: Payer: Self-pay

## 2022-03-28 ENCOUNTER — Telehealth: Payer: Self-pay | Admitting: Gastroenterology

## 2022-03-28 DIAGNOSIS — K921 Melena: Secondary | ICD-10-CM

## 2022-03-28 DIAGNOSIS — D649 Anemia, unspecified: Secondary | ICD-10-CM

## 2022-03-28 DIAGNOSIS — K269 Duodenal ulcer, unspecified as acute or chronic, without hemorrhage or perforation: Secondary | ICD-10-CM

## 2022-03-28 DIAGNOSIS — R5383 Other fatigue: Secondary | ICD-10-CM

## 2022-03-28 DIAGNOSIS — Z7902 Long term (current) use of antithrombotics/antiplatelets: Secondary | ICD-10-CM

## 2022-03-28 NOTE — Telephone Encounter (Signed)
Called and spoke with patient. See 11/17 gastrin level result note for details.

## 2022-03-28 NOTE — Telephone Encounter (Signed)
Patient called to go over path results, Also stated she is experiencing rectal bleeding.

## 2022-03-29 ENCOUNTER — Other Ambulatory Visit: Payer: Self-pay | Admitting: Adult Health

## 2022-03-29 ENCOUNTER — Other Ambulatory Visit (INDEPENDENT_AMBULATORY_CARE_PROVIDER_SITE_OTHER): Payer: Medicare Other

## 2022-03-29 ENCOUNTER — Other Ambulatory Visit: Payer: Self-pay

## 2022-03-29 DIAGNOSIS — Z7902 Long term (current) use of antithrombotics/antiplatelets: Secondary | ICD-10-CM | POA: Diagnosis not present

## 2022-03-29 DIAGNOSIS — K269 Duodenal ulcer, unspecified as acute or chronic, without hemorrhage or perforation: Secondary | ICD-10-CM

## 2022-03-29 DIAGNOSIS — D649 Anemia, unspecified: Secondary | ICD-10-CM

## 2022-03-29 DIAGNOSIS — R5383 Other fatigue: Secondary | ICD-10-CM

## 2022-03-29 DIAGNOSIS — E559 Vitamin D deficiency, unspecified: Secondary | ICD-10-CM

## 2022-03-29 DIAGNOSIS — K921 Melena: Secondary | ICD-10-CM | POA: Diagnosis not present

## 2022-03-29 DIAGNOSIS — E782 Mixed hyperlipidemia: Secondary | ICD-10-CM

## 2022-03-29 DIAGNOSIS — D500 Iron deficiency anemia secondary to blood loss (chronic): Secondary | ICD-10-CM

## 2022-03-29 LAB — CBC WITH DIFFERENTIAL/PLATELET
Basophils Absolute: 0.1 10*3/uL (ref 0.0–0.1)
Basophils Relative: 1.2 % (ref 0.0–3.0)
Eosinophils Absolute: 0.1 10*3/uL (ref 0.0–0.7)
Eosinophils Relative: 1.6 % (ref 0.0–5.0)
HCT: 34.7 % — ABNORMAL LOW (ref 36.0–46.0)
Hemoglobin: 11.4 g/dL — ABNORMAL LOW (ref 12.0–15.0)
Lymphocytes Relative: 24.3 % (ref 12.0–46.0)
Lymphs Abs: 1.8 10*3/uL (ref 0.7–4.0)
MCHC: 32.9 g/dL (ref 30.0–36.0)
MCV: 84.4 fl (ref 78.0–100.0)
Monocytes Absolute: 0.6 10*3/uL (ref 0.1–1.0)
Monocytes Relative: 7.6 % (ref 3.0–12.0)
Neutro Abs: 4.9 10*3/uL (ref 1.4–7.7)
Neutrophils Relative %: 65.3 % (ref 43.0–77.0)
Platelets: 266 10*3/uL (ref 150.0–400.0)
RBC: 4.11 Mil/uL (ref 3.87–5.11)
RDW: 16.2 % — ABNORMAL HIGH (ref 11.5–15.5)
WBC: 7.4 10*3/uL (ref 4.0–10.5)

## 2022-03-29 LAB — LDL CHOLESTEROL, DIRECT: Direct LDL: 67 mg/dL

## 2022-03-29 LAB — LIPID PANEL
Cholesterol: 146 mg/dL (ref 0–200)
HDL: 58.2 mg/dL (ref >39.00)
NonHDL: 87.99
Total CHOL/HDL Ratio: 3
Triglycerides: 228 mg/dL — ABNORMAL HIGH (ref 0.0–149.0)
VLDL: 45.6 mg/dL — ABNORMAL HIGH (ref 0.0–40.0)

## 2022-03-29 LAB — HEPATIC FUNCTION PANEL
ALT: 15 U/L (ref 0–35)
AST: 18 U/L (ref 0–37)
Albumin: 4 g/dL (ref 3.5–5.2)
Alkaline Phosphatase: 84 U/L (ref 39–117)
Bilirubin, Direct: 0.1 mg/dL (ref 0.0–0.3)
Total Bilirubin: 0.3 mg/dL (ref 0.2–1.2)
Total Protein: 6.8 g/dL (ref 6.0–8.3)

## 2022-03-29 LAB — VITAMIN D 25 HYDROXY (VIT D DEFICIENCY, FRACTURES): VITD: 38.58 ng/mL (ref 30.00–100.00)

## 2022-04-11 ENCOUNTER — Ambulatory Visit: Payer: Medicare Other | Admitting: Nurse Practitioner

## 2022-04-12 ENCOUNTER — Telehealth: Payer: Self-pay

## 2022-04-12 NOTE — Telephone Encounter (Signed)
MyChart message sent to patient with lab reminder.  

## 2022-04-12 NOTE — Telephone Encounter (Signed)
-----   Message from Yevette Edwards, RN sent at 03/29/2022  3:54 PM EST ----- Regarding: Labs CBC due - order in epic

## 2022-04-17 ENCOUNTER — Other Ambulatory Visit (INDEPENDENT_AMBULATORY_CARE_PROVIDER_SITE_OTHER): Payer: Medicare Other

## 2022-04-17 DIAGNOSIS — D649 Anemia, unspecified: Secondary | ICD-10-CM | POA: Diagnosis not present

## 2022-04-17 DIAGNOSIS — R5383 Other fatigue: Secondary | ICD-10-CM | POA: Diagnosis not present

## 2022-04-17 DIAGNOSIS — Z7902 Long term (current) use of antithrombotics/antiplatelets: Secondary | ICD-10-CM

## 2022-04-17 LAB — CBC WITH DIFFERENTIAL/PLATELET
Basophils Absolute: 0.1 10*3/uL (ref 0.0–0.1)
Basophils Relative: 1.8 % (ref 0.0–3.0)
Eosinophils Absolute: 0.1 10*3/uL (ref 0.0–0.7)
Eosinophils Relative: 2.1 % (ref 0.0–5.0)
HCT: 35.8 % — ABNORMAL LOW (ref 36.0–46.0)
Hemoglobin: 11.7 g/dL — ABNORMAL LOW (ref 12.0–15.0)
Lymphocytes Relative: 31.7 % (ref 12.0–46.0)
Lymphs Abs: 1.7 10*3/uL (ref 0.7–4.0)
MCHC: 32.7 g/dL (ref 30.0–36.0)
MCV: 84.2 fl (ref 78.0–100.0)
Monocytes Absolute: 0.6 10*3/uL (ref 0.1–1.0)
Monocytes Relative: 10.4 % (ref 3.0–12.0)
Neutro Abs: 2.9 10*3/uL (ref 1.4–7.7)
Neutrophils Relative %: 54 % (ref 43.0–77.0)
Platelets: 277 10*3/uL (ref 150.0–400.0)
RBC: 4.25 Mil/uL (ref 3.87–5.11)
RDW: 16.2 % — ABNORMAL HIGH (ref 11.5–15.5)
WBC: 5.3 10*3/uL (ref 4.0–10.5)

## 2022-04-17 NOTE — Telephone Encounter (Signed)
Patient came by for labs today.

## 2022-04-17 NOTE — Telephone Encounter (Signed)
Left patient a detailed vm with lab reminder. I informed patient that I also sent this information to her MyChart as well. Pt advised to call or send MyChart message if she has any questions or concerns.

## 2022-04-19 ENCOUNTER — Other Ambulatory Visit: Payer: Self-pay

## 2022-04-19 DIAGNOSIS — D649 Anemia, unspecified: Secondary | ICD-10-CM

## 2022-04-19 DIAGNOSIS — Z7902 Long term (current) use of antithrombotics/antiplatelets: Secondary | ICD-10-CM

## 2022-04-20 ENCOUNTER — Ambulatory Visit: Payer: Medicare Other | Admitting: Neurology

## 2022-04-25 ENCOUNTER — Ambulatory Visit (INDEPENDENT_AMBULATORY_CARE_PROVIDER_SITE_OTHER): Payer: Medicare Other | Admitting: Neurology

## 2022-04-25 ENCOUNTER — Encounter: Payer: Self-pay | Admitting: Neurology

## 2022-04-25 VITALS — BP 114/63 | HR 82 | Ht 67.0 in | Wt 181.0 lb

## 2022-04-25 DIAGNOSIS — R5383 Other fatigue: Secondary | ICD-10-CM | POA: Diagnosis not present

## 2022-04-25 DIAGNOSIS — R202 Paresthesia of skin: Secondary | ICD-10-CM | POA: Diagnosis not present

## 2022-04-25 DIAGNOSIS — H532 Diplopia: Secondary | ICD-10-CM | POA: Diagnosis not present

## 2022-04-25 DIAGNOSIS — D500 Iron deficiency anemia secondary to blood loss (chronic): Secondary | ICD-10-CM

## 2022-04-25 DIAGNOSIS — I25118 Atherosclerotic heart disease of native coronary artery with other forms of angina pectoris: Secondary | ICD-10-CM

## 2022-04-25 MED ORDER — GABAPENTIN 300 MG PO CAPS: 600.0000 mg | ORAL_CAPSULE | Freq: Every day | ORAL | 11 refills | Status: DC

## 2022-04-25 NOTE — Progress Notes (Signed)
Chief Complaint  Patient presents with   Follow-up    Rm 12, alone Pt reports no improvements, c/o lack of strength and fatigue       ASSESSMENT AND PLAN  Shelley Coleman is a 81 y.o. female  Extreme fatigue, lack of stamina, Intermittent double vision Restless leg symptoms  Laboratory evaluation including inflammatory marker  Higher dose of gabapentin up to 300 mg 2 tablets at nighttime for better control of her restless leg symptoms  EMG nerve conduction study to rule out neuropathy, intrinsic muscle disease,  DIAGNOSTIC DATA (LABS, IMAGING, TESTING) - I reviewed patient records, labs, notes, testing and imaging myself where available.  This MRI of the brain without contrast in Dec 2018. 1.    Chronic lacunar infarction involving the head of the left caudate and adjacent internal capsule, unchanged in appearance compared to the 11/01/2015 MRI. 2.    Chronic ischemic changes in the hemispheres, pons and thalamus, essentially unchanged when compared to the previous MRI.  3.    There are no acute findings and there are no changes in the brain compared to the MRI dated 11/01/2015    MEDICAL HISTORY:  Shelley Coleman is a 81 year old female, previous patient of Dr. Jannifer Franklin, primary care is nurse practitioner Carlisle Cater, Tommi Rumps,    I reviewed and summarized the referring note. PMHX Chronic insomnia Macular degeneration HLD Right Breast Cancer, s/p lobectomy more than 20 years ago, chemo/radiation therapy GI bleeding,  Left optic neuritis at age 47s. Stroke  Lumbar decompression surgery in past.  She was seen by Dr. Jannifer Franklin before 2014, carried a diagnosis of restless leg syndrome, chronic severe insomnia, low ferritin level, previously has required IV iron dextran on, which does help her symptoms  Today, her main concern is overwhelming fatigue, lack of stamina, this has been ongoing since 2020, seen by different specialist, including GI, cardiologist  EGD on March 17, 2022 by  GI physician Dr.  Cellar, 6 cm hiatal hernia, grade B esophagitis, stomach exam was normal, nonbleeding superficial small duodenal ulcer erosion, diverticulum was found in the second portion of duodenum,   Cardiology work up in Sept 2023, chart reviewed, coronary artery disease, with borderline significant stenosis in the [facility], her fatigue with exertion may be stable angina, suggest sublingual nitroglycerin as needed, hypertension that was well-controlled, history of CVA, hyperlipidemia, could not tolerate statin, myalgia, whole family have similar issue, received Leqvi infusion February 16, 2022,  She also reported history of left optic neuritis, many years ago, recovering well, COVID the diagnosis of macular degeneration now, receiving avastin injection periodically  She complains of overwhelming generalized fatigue, has not been able to bring back her trash can, it is too much for her to go to her driveway, and bring it back, she lives alone, still driving, but her children check on her frequently  She complains of many years history of intermittent double vision,  Laboratory evaluations 2023, hemoglobin of 11.7, LDL 67, gastrin level was 45 within normal limit, ferritin level was only 6.3, TIBC was elevated at 455     PHYSICAL EXAM:   Vitals:   04/25/22 1359  BP: 114/63  Pulse: 82  Weight: 181 lb (82.1 kg)  Height: '5\' 7"'$  (1.702 m)   Body mass index is 28.35 kg/m.  PHYSICAL EXAMNIATION:  Gen: NAD, conversant, well nourised, well groomed                     Cardiovascular: Regular rate rhythm, no  peripheral edema, warm, nontender. Eyes: Conjunctivae clear without exudates or hemorrhage Neck: Supple, no carotid bruits. Pulmonary: Clear to auscultation bilaterally   NEUROLOGICAL EXAM:  MENTAL STATUS: Speech/cognition: Awake, alert, oriented to history taking and casual conversation CRANIAL NERVES: CN II: Visual fields are full to confrontation. Pupils are round equal  and briskly reactive to light. CN III, IV, VI: extraocular movement are normal. No ptosis. CN V: Facial sensation is intact to light touch CN VII: Face is symmetric with normal eye closure  CN VIII: Hearing is normal to causal conversation. CN IX, X: Phonation is normal. CN XI: Head turning and shoulder shrug are intact  MOTOR: Mild bilateral shoulder abduction, external rotation weakness,  REFLEXES: Reflexes are 1 and symmetric at the biceps, triceps, knees, and ankles. Plantar responses are flexor.  SENSORY: Intact to light touch, pinprick and vibratory sensation are intact in fingers and toes.  COORDINATION: There is no trunk or limb dysmetria noted.  GAIT/STANCE: Able to get up from seated position with effort, cautious,  REVIEW OF SYSTEMS:  Full 14 system review of systems performed and notable only for as above All other review of systems were negative.   ALLERGIES: Allergies  Allergen Reactions   Diflunisal     REACTION: rash , swelling  anaphlaxsis   Lipitor [Atorvastatin]     Pt had body joint pain   Statins     Myalgia     Tetanus Toxoid     REACTION: arm swelling, fever    HOME MEDICATIONS: Current Outpatient Medications  Medication Sig Dispense Refill   acetaminophen (TYLENOL) 500 MG tablet Take 1,000 mg by mouth every 6 (six) hours as needed for moderate pain or headache.     ALPRAZolam (XANAX) 1 MG tablet Take 0.5-1 tablets (0.5-1 mg total) by mouth 2 (two) times daily as needed for anxiety. 30 tablet 1   atenolol (TENORMIN) 50 MG tablet Take 1 tablet (50 mg total) by mouth daily. 90 tablet 3   bevacizumab (AVASTIN) 400 MG/16ML SOLN Inject 400 mg into the vein once. 16 ML     clopidogrel (PLAVIX) 75 MG tablet Take 1 tablet (75 mg total) by mouth daily. 90 tablet 3   Multiple Vitamins-Minerals (CENTRUM SILVER 50+WOMEN) TABS Take 1 tablet by mouth daily.     Multiple Vitamins-Minerals (PRESERVISION AREDS 2 PO) Take 1 tablet by mouth in the morning and at  bedtime.     nitroGLYCERIN (NITROSTAT) 0.4 MG SL tablet PLACE ONE TABLET UNDER THE TONGUE EVERY 5 MINUTES AS NEEDED. 25 tablet 12   NONFORMULARY OR COMPOUNDED ITEM as needed. Formula 25: Doxepin 3% Amantadine 3% Dextromethorphan 2% Lidocaine 2%       Polyethyl Glycol-Propyl Glycol (SYSTANE OP) Place 1 drop into both eyes daily as needed (dry eyes).     sucralfate (CARAFATE) 1 g tablet Take 1 tablet (1 g total) by mouth 4 (four) times daily. Please take medication 2 hours before or after other medication 120 tablet 0   pantoprazole (PROTONIX) 40 MG tablet Take 1 tablet (40 mg total) by mouth 2 (two) times daily. 60 tablet 11   pantoprazole (PROTONIX) 40 MG tablet Take 2 tablets (80 mg total) by mouth 2 (two) times daily. 120 tablet 0   No current facility-administered medications for this visit.    PAST MEDICAL HISTORY: Past Medical History:  Diagnosis Date   Anemia    Has had iron infusions   Breast cancer ([facility])    right   Cerebrovascular disease 11/12/2018  Depression    Depression    GERD (gastroesophageal reflux disease)    Hiatal hernia    History of GI bleed    Hx of mitral valve prolapse    Hyperlipidemia    Irregular heart beat    Neuropathy    numbness in both feet   Nocturnal leg cramps 09/02/2015   Optic neuritis    left   Osteopenia    Ovarian cyst    Parotid tumor    right   Pelvic fracture (HCC)    Restless legs syndrome    Shingles 2000   Left in V1   Stroke [facility])     PAST SURGICAL HISTORY: Past Surgical History:  Procedure Laterality Date   ABDOMINAL HYSTERECTOMY     APPENDECTOMY     BREAST LUMPECTOMY Left    CATARACT EXTRACTION     ESOPHAGEAL MANOMETRY N/A 01/13/2013   Procedure: ESOPHAGEAL MANOMETRY (EM);  Surgeon: Sable Feil, MD;  Location: [facility] ENDOSCOPY;  Service: Endoscopy;  Laterality: N/A;   ESOPHAGOGASTRODUODENOSCOPY (EGD) WITH PROPOFOL N/A 10/30/2020   Procedure: ESOPHAGOGASTRODUODENOSCOPY (EGD) WITH PROPOFOL;  Surgeon: Mauri Pole, MD;  Location: [facility];  Service: Endoscopy;  Laterality: N/A;   FOREIGN BODY REMOVAL  10/30/2020   Procedure: FOREIGN BODY REMOVAL;  Surgeon: Mauri Pole, MD;  Location: [facility];  Service: Endoscopy;;   HERNIA REPAIR  08/2009   HOT HEMOSTASIS N/A 10/30/2020   Procedure: HOT HEMOSTASIS (ARGON PLASMA COAGULATION/BICAP);  Surgeon: Mauri Pole, MD;  Location: [facility] ENDOSCOPY;  Service: Endoscopy;  Laterality: N/A;   MASTECTOMY Right    SPINE SURGERY     TOTAL HIP ARTHROPLASTY Left     FAMILY HISTORY: Family History  Problem Relation Age of Onset   Cancer Mother        Angio sarcoma    Heart disease Father    Hypertension Father    Stroke Father        x2   Heart attack Father        x 11   Hernia Brother    Hypertension Brother    Neuropathy Neg Hx    Restless legs syndrome Neg Hx    Colon cancer Neg Hx    Esophageal cancer Neg Hx    Pancreatic cancer Neg Hx    Stomach cancer Neg Hx     SOCIAL HISTORY: Social History   Socioeconomic History   Marital status: Married    Spouse name: Not on file   Number of children: 2   Years of education: 16   Highest education level: Not on file  Occupational History   Occupation: Retired Therapist, sports  Tobacco Use   Smoking status: Never   Smokeless tobacco: Never  Vaping Use   Vaping Use: Never used  Substance and Sexual Activity   Alcohol use: Yes    Alcohol/week: 1.0 standard drink of alcohol    Types: 1 Glasses of wine per week    Comment: Rarely,socially   Drug use: No   Sexual activity: Not on file  Other Topics Concern   Not on file  Social History Narrative   Retired from nursing    Widowed    Left-handed      Social Determinants of Health   Financial Resource Strain: Low Risk  (01/19/2022)   Overall Financial Resource Strain (CARDIA)    Difficulty of Paying Living Expenses: Not hard at all  Food Insecurity: No Food Insecurity (01/19/2022)   Hunger Vital Sign  Worried About Sales executive in the Last Year: Never true    [place] in the Last Year: Never true  Transportation Needs: No Transportation Needs (01/19/2022)   PRAPARE - Hydrologist (Medical): No    Lack of Transportation (Non-Medical): No  Physical Activity: Inactive (01/19/2022)   Exercise Vital Sign    Days of Exercise per Week: 0 days    Minutes of Exercise per Session: 0 min  Stress: No Stress Concern Present (01/19/2022)   [place]    Feeling of Stress : Not at all  Social Connections: Moderately Integrated (01/19/2022)   Social Connection and Isolation Panel [NHANES]    Frequency of Communication with Friends and Family: More than three times a week    Frequency of Social Gatherings with Friends and Family: More than three times a week    Attends Religious Services: More than 4 times per year    Active Member of [employer] or Organizations: Yes    Attends Archivist Meetings: More than 4 times per year    Marital Status: Widowed  Intimate Partner Violence: Not At Risk (01/19/2022)   Humiliation, Afraid, Rape, and Kick questionnaire    Fear of Current or Ex-Partner: No    Emotionally Abused: No    Physically Abused: No    Sexually Abused: No      Marcial Pacas, M.D. Ph.D.  [facility] [address], [place], [place] [address] Ph: [phone] Fax: [phone]  CC:  Dorothyann Peng, NP [place],  [place] [address]  Dorothyann Peng, NP

## 2022-04-26 ENCOUNTER — Telehealth: Payer: Self-pay | Admitting: Neurology

## 2022-04-26 NOTE — Telephone Encounter (Signed)
Referral for Hematology/Oncology fax to [facility]. [place]: [phone], Fax: [phone]

## 2022-04-27 LAB — AUTOIMMUNE NEUROLOGY AB
AGNA-1: NEGATIVE
AMPA-R1 Antibody: NEGATIVE
AMPA-R2 Antibody: NEGATIVE
Amphiphysin Antibody: NEGATIVE
Anti-Hu Ab: NEGATIVE
Anti-Ri Ab: NEGATIVE
Anti-Yo Ab: NEGATIVE
Antineruonal nuclear Ab Type 3: NEGATIVE
Aquaporin 4 Antibody: NEGATIVE
CASPR2 Antibody,Cell-based IFA: NEGATIVE
CRMP-5 IgG: NEGATIVE
DNER Antibody: NEGATIVE
DPPX Antibody: NEGATIVE
GABA-B-R Antibody: NEGATIVE
GAD65 Antibody: NEGATIVE
ITPR1 Antibody: NEGATIVE
IgLON5 Antibody: NEGATIVE
Interpretation: NEGATIVE
LGI1 Antibody, Cell-based IFA: NEGATIVE
Ma2/Ta Antibody: NEGATIVE
NMDA-R Antibody: NEGATIVE
Purkinje Cell Cyto Ab Type 2: NEGATIVE
Purkinje Cell Cyto Ab Type Tr: NEGATIVE
VGCC Antibody: 2.8 pmol/L (ref 0.0–30.0)
Zic4 Antibody: NEGATIVE
mGluR1 Antibody: NEGATIVE

## 2022-04-27 LAB — MULTIPLE MYELOMA PANEL, SERUM
Albumin SerPl Elph-Mcnc: 3.4 g/dL (ref 2.9–4.4)
Albumin/Glob SerPl: 1.2 (ref 0.7–1.7)
Alpha 1: 0.3 g/dL (ref 0.0–0.4)
Alpha2 Glob SerPl Elph-Mcnc: 0.8 g/dL (ref 0.4–1.0)
B-Globulin SerPl Elph-Mcnc: 1 g/dL (ref 0.7–1.3)
Gamma Glob SerPl Elph-Mcnc: 0.9 g/dL (ref 0.4–1.8)
Globulin, Total: 2.9 g/dL (ref 2.2–3.9)
IgA/Immunoglobulin A, Serum: 69 mg/dL (ref 64–422)
IgG (Immunoglobin G), Serum: 1035 mg/dL (ref 586–1602)
IgM (Immunoglobulin M), Srm: 46 mg/dL (ref 26–217)
Total Protein: 6.3 g/dL (ref 6.0–8.5)

## 2022-04-27 LAB — ANCA PROFILE
Anti-MPO Antibodies: <0.2 units (ref 0.0–0.9)
Anti-PR3 Antibodies: 6.3 units — ABNORMAL HIGH (ref 0.0–0.9)
Atypical pANCA: <1:20 {titer}
C-ANCA: <1:20 {titer}
P-ANCA: <1:20 {titer}

## 2022-04-27 LAB — SEDIMENTATION RATE: Sed Rate: 8 mm/hr (ref 0–40)

## 2022-04-27 LAB — CK: Total CK: 122 U/L (ref 32–182)

## 2022-04-27 LAB — RPR: RPR Ser Ql: NONREACTIVE

## 2022-04-27 LAB — HGB A1C W/O EAG: Hgb A1c MFr Bld: 5.2 % (ref 4.8–5.6)

## 2022-04-27 LAB — C-REACTIVE PROTEIN: CRP: 4 mg/L (ref 0–10)

## 2022-04-27 LAB — ANA W/REFLEX IF POSITIVE: Anti Nuclear Antibody (ANA): NEGATIVE

## 2022-04-27 LAB — FOLATE: Folate: >20 ng/mL (ref >3.0)

## 2022-04-27 LAB — VITAMIN B12: Vitamin B-12: 614 pg/mL (ref 232–1245)

## 2022-04-27 LAB — TSH: TSH: 1.37 u[IU]/mL (ref 0.450–4.500)

## 2022-05-04 ENCOUNTER — Telehealth (HOSPITAL_BASED_OUTPATIENT_CLINIC_OR_DEPARTMENT_OTHER): Payer: Self-pay | Admitting: *Deleted

## 2022-05-04 NOTE — Telephone Encounter (Signed)
Prior auth for Nexlizet started, received notification approved from 05/01/2022-05/01/2023

## 2022-05-10 ENCOUNTER — Telehealth: Payer: Self-pay | Admitting: Adult Health

## 2022-05-10 NOTE — Telephone Encounter (Signed)
FYI pt is calling to let cory know she is going to [facility] tomorrow due to abnormal blood work. Dr Krista Blue neurologist is refer her also she is seeing dr Luvenia Starch [facility] for chronic blood loss.

## 2022-05-11 DIAGNOSIS — R202 Paresthesia of skin: Secondary | ICD-10-CM | POA: Diagnosis not present

## 2022-05-11 DIAGNOSIS — Z8719 Personal history of other diseases of the digestive system: Secondary | ICD-10-CM | POA: Diagnosis not present

## 2022-05-11 DIAGNOSIS — D500 Iron deficiency anemia secondary to blood loss (chronic): Secondary | ICD-10-CM | POA: Diagnosis not present

## 2022-05-18 ENCOUNTER — Ambulatory Visit (INDEPENDENT_AMBULATORY_CARE_PROVIDER_SITE_OTHER): Payer: Medicare Other | Admitting: *Deleted

## 2022-05-18 VITALS — BP 136/79 | HR 75 | Temp 98.3°F | Resp 16 | Ht 67.0 in | Wt 184.6 lb

## 2022-05-18 DIAGNOSIS — E782 Mixed hyperlipidemia: Secondary | ICD-10-CM

## 2022-05-18 MED ORDER — INCLISIRAN SODIUM 284 MG/1.5ML ~~LOC~~ SOSY
284.0000 mg | PREFILLED_SYRINGE | Freq: Once | SUBCUTANEOUS | Status: AC
  Administered 2022-05-18: 284 mg via SUBCUTANEOUS
  Filled 2022-05-18: qty 1.5

## 2022-05-18 NOTE — Progress Notes (Signed)
Diagnosis: Hyperlipidemia  Provider:  Marshell Garfinkel MD  Procedure: Injection  Leqvio (inclisiran), Dose: 284 mg, Site: subcutaneous, Number of injections: 1  Post Care: Observation period completed  Discharge: Condition: Good, Destination: Home . AVS provided to patient.   Performed by:  Oren Beckmann, RN

## 2022-05-19 DIAGNOSIS — D500 Iron deficiency anemia secondary to blood loss (chronic): Secondary | ICD-10-CM | POA: Diagnosis not present

## 2022-05-21 DIAGNOSIS — D500 Iron deficiency anemia secondary to blood loss (chronic): Secondary | ICD-10-CM | POA: Diagnosis not present

## 2022-05-22 ENCOUNTER — Telehealth: Payer: Self-pay

## 2022-05-22 NOTE — Telephone Encounter (Signed)
Called and left patient a detailed vm letting her know that she was due for labs, but I saw that she recently had these completed with [facility]. I asked patient to call back and let us know if she will continue to follow with [facility] or if she plans to keep her upcoming follow up appt with Dr. Havery Moros. I asked that patient call back with that information.

## 2022-05-22 NOTE — Telephone Encounter (Signed)
-----  Message from Yetta Flock, MD sent at 05/22/2022 12:11 PM EST ----- Regarding: RE: Labs Okay they can check her labs. She is scheduled to see me in the office next month. Can you ask her if she is going to keep that appointment or not? Thanks  ----- Message ----- From: Yevette Edwards, RN Sent: 05/22/2022   9:06 AM EST To: Yetta Flock, MD Subject: FW: Labs                                       Good morning Dr. Havery Moros, I had a reminder that patient is due for labs at this time. It looks like patient is now being followed by [facility]. Thanks  ----- Message ----- From: Yevette Edwards, RN Sent: 05/22/2022  12:00 AM EST To: Yevette Edwards, RN Subject: Labs                                           CBC due - order is in epic

## 2022-05-24 NOTE — Telephone Encounter (Signed)
[place] thank you for taking the time to speak with her and the follow up, I appreciate it. I will see her in February.

## 2022-05-24 NOTE — Telephone Encounter (Signed)
Called and spoke with patient for about 15 minutes. She states that her neurologist referred her to [facility] for IV iron infusions. Pt's 1st infusion was on 05/21/22. Pt states that it made her feel better. Pt wishes to keep her follow up appt with you as scheduled. She wanted to know if you want to repeat labs prior to that office visit? Pt states that she had previously been feeling poorly and just wants to feel better. Pt states that they referred her to [facility] for her recurrent GI bleeds and anemia. Pt is scheduled with them in May. Pt wishes to follow up with you at this time, she does not want you to feel like she does not want  your care. I informed patient that you would not think that and you would just be happy that she is taken care of. I advised pt that I would keep follow up here in February and you all could discuss further about the referral to [facility] and come up with a plan for her. Pt verbalized understanding and had no concerns at the end of the call.

## 2022-05-25 ENCOUNTER — Telehealth: Payer: Self-pay | Admitting: Adult Health

## 2022-05-25 NOTE — Telephone Encounter (Signed)
Pt and her daughter are extremely concerned about her health (Gastro, Cardio, Neuro, Rheumo) and would really like a call back from NP or MD.   Pt would like to discuss those things, as well as an infusion as soon as possible.  [phone]     LOV:  12/06/21

## 2022-05-25 NOTE — Telephone Encounter (Signed)
FYI Pt has been scheduled for ov.

## 2022-05-26 ENCOUNTER — Ambulatory Visit (INDEPENDENT_AMBULATORY_CARE_PROVIDER_SITE_OTHER): Payer: Medicare Other | Admitting: Adult Health

## 2022-05-26 ENCOUNTER — Encounter: Payer: Self-pay | Admitting: Adult Health

## 2022-05-26 VITALS — BP 110/64 | HR 72 | Temp 97.8°F | Ht 67.0 in | Wt 180.0 lb

## 2022-05-26 DIAGNOSIS — R5383 Other fatigue: Secondary | ICD-10-CM

## 2022-05-26 DIAGNOSIS — D500 Iron deficiency anemia secondary to blood loss (chronic): Secondary | ICD-10-CM

## 2022-05-26 NOTE — Progress Notes (Signed)
Subjective:    Patient ID: Shelley Coleman, female    DOB: 1941/02/28, 82 y.o.   MRN: [id number]  HPI 82 year old female who  has a past medical history of Anemia, Breast cancer ([place]), Cerebrovascular disease (11/12/2018), Depression, Depression, GERD (gastroesophageal reflux disease), Hiatal hernia, History of GI bleed, mitral valve prolapse, Hyperlipidemia, Irregular heart beat, Neuropathy, Nocturnal leg cramps (09/02/2015), Optic neuritis, Osteopenia, Ovarian cyst, Parotid tumor, Pelvic fracture ([place]), Restless legs syndrome, Shingles (2000), and Stroke ([place]).  She presents to the office today for follow-up visit.  Quite some time she has been experiencing fatigue decreased stamina that has been present since roughly 2020.  She has been seen by multiple specialist including gastroenterology and cardiology.  She had an EGD done on March 17, 2022 by her GI doctor which showed a 6 cm hiatal hernia, grade B esophagitis, nonbleeding superficial small duodenal ulcer or erosion and diverticulum in the second portion of the duodenum.  Lab Results  Component Value Date   IRON 28 (L) 03/10/2022   TIBC 455.0 (H) 03/10/2022   FERRITIN 6.3 (L) 03/10/2022   He was referred to [facility] hematology and had an iron infusion done 5 days ago.  [facility] hematology has since referred her to [facility] gastroenterology and from the sounds of it there going to do a capsule study but that appointment is not until May.  She is on a cancellation list so.  He does report today that she will have some intermittent episodes of dark stools but that these resolve pretty quickly.  He also reports having some mild abdominal pain from time to time.  She had a iron infusion, which she has had in the past and responded well to she is not feeling much better as of yet.  She continues to have chronic fatigue and feels as though she has no stamina.  Even simple measures such as walking short and says causes fatigue.  Iron levels done at [facility] on  May 11, 2022 showed an iron level 73, TIBC 472,Ferritin 6, B12 358.  Globin at this time was 10.5     Review of Systems See HPI   Past Medical History:  Diagnosis Date   Anemia    Has had iron infusions   Breast cancer ([place])    right   Cerebrovascular disease 11/12/2018   Depression    Depression    GERD (gastroesophageal reflux disease)    Hiatal hernia    History of GI bleed    Hx of mitral valve prolapse    Hyperlipidemia    Irregular heart beat    Neuropathy    numbness in both feet   Nocturnal leg cramps 09/02/2015   Optic neuritis    left   Osteopenia    Ovarian cyst    Parotid tumor    right   Pelvic fracture ([facility])    Restless legs syndrome    Shingles 2000   Left in V1   Stroke [facility])     Social History   Socioeconomic History   Marital status: Married    Spouse name: Not on file   Number of children: 2   Years of education: 16   Highest education level: Not on file  Occupational History   Occupation: Retired Therapist, sports  Tobacco Use   Smoking status: Never   Smokeless tobacco: Never  Vaping Use   Vaping Use: Never used  Substance and Sexual Activity   Alcohol use: Yes    Alcohol/week: 1.0  standard drink of alcohol    Types: 1 Glasses of wine per week    Comment: Rarely,socially   Drug use: No   Sexual activity: Not on file  Other Topics Concern   Not on file  Social History Narrative   Retired from nursing    Widowed    Left-handed      Social Determinants of Health   Financial Resource Strain: Low Risk  (01/19/2022)   Overall Financial Resource Strain (CARDIA)    Difficulty of Paying Living Expenses: Not hard at all  Food Insecurity: No Food Insecurity (01/19/2022)   Hunger Vital Sign    Worried About Running Out of Food in the Last Year: Never true    [place] in the Last Year: Never true  Transportation Needs: No Transportation Needs (01/19/2022)   PRAPARE - Hydrologist (Medical): No    Lack of  Transportation (Non-Medical): No  Physical Activity: Inactive (01/19/2022)   Exercise Vital Sign    Days of Exercise per Week: 0 days    Minutes of Exercise per Session: 0 min  Stress: No Stress Concern Present (01/19/2022)   [place]    Feeling of Stress : Not at all  Social Connections: Moderately Integrated (01/19/2022)   Social Connection and Isolation Panel [NHANES]    Frequency of Communication with Friends and Family: More than three times a week    Frequency of Social Gatherings with Friends and Family: More than three times a week    Attends Religious Services: More than 4 times per year    Active Member of [employer] or Organizations: Yes    Attends Archivist Meetings: More than 4 times per year    Marital Status: Widowed  Intimate Partner Violence: Not At Risk (01/19/2022)   Humiliation, Afraid, Rape, and Kick questionnaire    Fear of Current or Ex-Partner: No    Emotionally Abused: No    Physically Abused: No    Sexually Abused: No    Past Surgical History:  Procedure Laterality Date   ABDOMINAL HYSTERECTOMY     APPENDECTOMY     BREAST LUMPECTOMY Left    CATARACT EXTRACTION     ESOPHAGEAL MANOMETRY N/A 01/13/2013   Procedure: ESOPHAGEAL MANOMETRY (EM);  Surgeon: Sable Feil, MD;  Location: [facility];  Service: Endoscopy;  Laterality: N/A;   ESOPHAGOGASTRODUODENOSCOPY (EGD) WITH PROPOFOL N/A 10/30/2020   Procedure: ESOPHAGOGASTRODUODENOSCOPY (EGD) WITH PROPOFOL;  Surgeon: Mauri Pole, MD;  Location: [facility];  Service: Endoscopy;  Laterality: N/A;   FOREIGN BODY REMOVAL  10/30/2020   Procedure: FOREIGN BODY REMOVAL;  Surgeon: Mauri Pole, MD;  Location: [place];  Service: Endoscopy;;   HERNIA REPAIR  08/2009   HOT HEMOSTASIS N/A 10/30/2020   Procedure: HOT HEMOSTASIS (ARGON PLASMA COAGULATION/BICAP);  Surgeon: Mauri Pole, MD;  Location: [facility] ENDOSCOPY;  Service:  Endoscopy;  Laterality: N/A;   MASTECTOMY Right    SPINE SURGERY     TOTAL HIP ARTHROPLASTY Left     Family History  Problem Relation Age of Onset   Cancer Mother        Angio sarcoma    Heart disease Father    Hypertension Father    Stroke Father        x2   Heart attack Father        x 29   Hernia Brother    Hypertension Brother  Neuropathy Neg Hx    Restless legs syndrome Neg Hx    Colon cancer Neg Hx    Esophageal cancer Neg Hx    Pancreatic cancer Neg Hx    Stomach cancer Neg Hx     Allergies  Allergen Reactions   Diflunisal     REACTION: rash , swelling  anaphlaxsis   Lipitor [Atorvastatin]     Pt had body joint pain   Statins     Myalgia     Tetanus Toxoid     REACTION: arm swelling, fever    Current Outpatient Medications on File Prior to Visit  Medication Sig Dispense Refill   acetaminophen (TYLENOL) 500 MG tablet Take 1,000 mg by mouth every 6 (six) hours as needed for moderate pain or headache.     ALPRAZolam (XANAX) 1 MG tablet Take 0.5-1 tablets (0.5-1 mg total) by mouth 2 (two) times daily as needed for anxiety. 30 tablet 1   atenolol (TENORMIN) 50 MG tablet Take 1 tablet (50 mg total) by mouth daily. 90 tablet 3   bevacizumab (AVASTIN) 400 MG/16ML SOLN Inject 400 mg into the vein once. 16 ML     clopidogrel (PLAVIX) 75 MG tablet Take 1 tablet (75 mg total) by mouth daily. 90 tablet 3   gabapentin (NEURONTIN) 300 MG capsule Take 2 capsules (600 mg total) by mouth at bedtime. 60 capsule 11   Multiple Vitamins-Minerals (CENTRUM SILVER 50+WOMEN) TABS Take 1 tablet by mouth daily.     Multiple Vitamins-Minerals (PRESERVISION AREDS 2 PO) Take 1 tablet by mouth in the morning and at bedtime.     nitroGLYCERIN (NITROSTAT) 0.4 MG SL tablet PLACE ONE TABLET UNDER THE TONGUE EVERY 5 MINUTES AS NEEDED. 25 tablet 12   NONFORMULARY OR COMPOUNDED ITEM as needed. Formula 25: Doxepin 3% Amantadine 3% Dextromethorphan 2% Lidocaine 2%       Polyethyl  Glycol-Propyl Glycol (SYSTANE OP) Place 1 drop into both eyes daily as needed (dry eyes).     sucralfate (CARAFATE) 1 g tablet Take 1 tablet (1 g total) by mouth 4 (four) times daily. Please take medication 2 hours before or after other medication 120 tablet 0   pantoprazole (PROTONIX) 40 MG tablet Take 1 tablet (40 mg total) by mouth 2 (two) times daily. 60 tablet 11   pantoprazole (PROTONIX) 40 MG tablet Take 2 tablets (80 mg total) by mouth 2 (two) times daily. 120 tablet 0   No current facility-administered medications on file prior to visit.    BP 110/64   Pulse 72   Temp 97.8 F (36.6 C) (Oral)   Ht '5\' 7"'$  (1.702 m)   Wt 180 lb (81.6 kg)   SpO2 93%   BMI 28.19 kg/m       Objective:   Physical Exam Vitals and nursing note reviewed.  Constitutional:      Appearance: Normal appearance.  Cardiovascular:     Rate and Rhythm: Normal rate and regular rhythm.     Pulses: Normal pulses.     Heart sounds: Normal heart sounds.  Pulmonary:     Effort: Pulmonary effort is normal.     Breath sounds: Normal breath sounds.  Musculoskeletal:        General: Normal range of motion.  Skin:    General: Skin is warm and dry.  Neurological:     General: No focal deficit present.     Mental Status: She is alert and oriented to person, place, and time.  Psychiatric:  Mood and Affect: Mood normal.        Behavior: Behavior normal.        Thought Content: Thought content normal.        Judgment: Judgment normal.       Assessment & Plan:  1. Iron deficiency anemia due to chronic blood loss -She is frustrated and rightfully so.  I tried to encourage her that iron infusions usually take 2 to 3 weeks before people start to feel better.  She may have to have multiple iron infusions.  No real source of bleeding has been found yet and it also sounds like she is going to be referred to [facility] rheumatology as there is some concern that this could be autoimmune?.  I am going to bring her back  in 2 weeks to see how she is feeling and if anything changes before then she knows that she can contact myself.  2. Other fatigue - due to anemia    Dorothyann Peng, NP  Time spent with patient today was 33 minutes which consisted of chart review, discussing fatigue, anemia, and iron infusions, work up, treatment answering questions and documentation.

## 2022-05-26 NOTE — Patient Instructions (Addendum)
I am sorry you are not feeling well. I think you will start to feel better in a couple of weeks as it usually takes 2-3 weeks after an iron infusion before people start to feel better. I am going to have you follow up in two weeks to see how you are feeling.

## 2022-06-05 ENCOUNTER — Other Ambulatory Visit: Payer: Self-pay | Admitting: Gastroenterology

## 2022-06-09 ENCOUNTER — Ambulatory Visit (INDEPENDENT_AMBULATORY_CARE_PROVIDER_SITE_OTHER): Payer: Medicare Other | Admitting: Adult Health

## 2022-06-09 VITALS — BP 100/70 | HR 75 | Temp 97.7°F | Ht 67.0 in | Wt 181.0 lb

## 2022-06-09 DIAGNOSIS — D500 Iron deficiency anemia secondary to blood loss (chronic): Secondary | ICD-10-CM

## 2022-06-09 LAB — CBC WITH DIFFERENTIAL/PLATELET
Basophils Absolute: 0.1 10*3/uL (ref 0.0–0.1)
Basophils Relative: 1.2 % (ref 0.0–3.0)
Eosinophils Absolute: 0.1 10*3/uL (ref 0.0–0.7)
Eosinophils Relative: 1.5 % (ref 0.0–5.0)
HCT: 36.8 % (ref 36.0–46.0)
Hemoglobin: 12 g/dL (ref 12.0–15.0)
Lymphocytes Relative: 23.8 % (ref 12.0–46.0)
Lymphs Abs: 1.6 10*3/uL (ref 0.7–4.0)
MCHC: 32.6 g/dL (ref 30.0–36.0)
MCV: 83.6 fl (ref 78.0–100.0)
Monocytes Absolute: 0.6 10*3/uL (ref 0.1–1.0)
Monocytes Relative: 8.7 % (ref 3.0–12.0)
Neutro Abs: 4.4 10*3/uL (ref 1.4–7.7)
Neutrophils Relative %: 64.8 % (ref 43.0–77.0)
Platelets: 272 10*3/uL (ref 150.0–400.0)
RBC: 4.4 Mil/uL (ref 3.87–5.11)
RDW: 19.8 % — ABNORMAL HIGH (ref 11.5–15.5)
WBC: 6.7 10*3/uL (ref 4.0–10.5)

## 2022-06-09 LAB — IBC + FERRITIN
Ferritin: 271 ng/mL (ref 10.0–291.0)
Iron: 64 ug/dL (ref 42–145)
Saturation Ratios: 21 % (ref 20.0–50.0)
TIBC: 305.2 ug/dL (ref 250.0–450.0)
Transferrin: 218 mg/dL (ref 212.0–360.0)

## 2022-06-09 NOTE — Progress Notes (Signed)
Subjective:    Patient ID: Shelley Coleman, female    DOB: 22-Apr-1941, 82 y.o.   MRN: [id number]  HPI  82 year old female who  has a past medical history of Anemia, Breast cancer ([place]), Cerebrovascular disease (11/12/2018), Depression, Depression, GERD (gastroesophageal reflux disease), Hiatal hernia, History of GI bleed, mitral valve prolapse, Hyperlipidemia, Irregular heart beat, Neuropathy, Nocturnal leg cramps (09/02/2015), Optic neuritis, Osteopenia, Ovarian cyst, Parotid tumor, Pelvic fracture ([place]), Restless legs syndrome, Shingles (2000), and Stroke ([place]).  He presents to the office today for follow-up regarding iron deficiency anemia.  When I saw her on April 26, 2023 she had an iron infusion through [facility] hematology 5 days prior.  She was frustrated due to not feeling better after the iron infusion, continued to have severe fatigue.  He was advised that it can take multiple weeks to start to get her energy back after an infusion and that she may have multiple infusions in the future.  I wanted to bring her back in 3 weeks to see how she was feeling.  Iron levels done at [facility] on May 11, 2022 showed an iron level 73, TIBC 472,Ferritin 6, B12 358.  Hemoglobin  at this time was 10.5  Today she reports that she feels like she is more alert and when she wakes up in the morning she feels the best. She continues to have dyspnea on exertion and fatigue.   She was placed on Gabapentin by Dr. Krista Blue for restless leg syndrome and reports that she is sleeping much better  Review of Systems See HPI   Past Medical History:  Diagnosis Date   Anemia    Has had iron infusions   Breast cancer ([place])    right   Cerebrovascular disease 11/12/2018   Depression    Depression    GERD (gastroesophageal reflux disease)    Hiatal hernia    History of GI bleed    Hx of mitral valve prolapse    Hyperlipidemia    Irregular heart beat    Neuropathy    numbness in both feet   Nocturnal leg cramps  09/02/2015   Optic neuritis    left   Osteopenia    Ovarian cyst    Parotid tumor    right   Pelvic fracture ([facility])    Restless legs syndrome    Shingles 2000   Left in V1   Stroke [facility])     Social History   Socioeconomic History   Marital status: Married    Spouse name: Not on file   Number of children: 2   Years of education: 16   Highest education level: Not on file  Occupational History   Occupation: Retired Therapist, sports  Tobacco Use   Smoking status: Never   Smokeless tobacco: Never  Vaping Use   Vaping Use: Never used  Substance and Sexual Activity   Alcohol use: Yes    Alcohol/week: 1.0 standard drink of alcohol    Types: 1 Glasses of wine per week    Comment: Rarely,socially   Drug use: No   Sexual activity: Not on file  Other Topics Concern   Not on file  Social History Narrative   Retired from nursing    Widowed    Left-handed      Social Determinants of Health   Financial Resource Strain: Low Risk  (01/19/2022)   Overall Financial Resource Strain (CARDIA)    Difficulty of Paying Living Expenses: Not hard at all  Food Insecurity: No Food Insecurity (01/19/2022)   Hunger Vital Sign    Worried About Running Out of Food in the Last Year: Never true    Ran Out of Food in the Last Year: Never true  Transportation Needs: No Transportation Needs (01/19/2022)   PRAPARE - Hydrologist (Medical): No    Lack of Transportation (Non-Medical): No  Physical Activity: Inactive (01/19/2022)   Exercise Vital Sign    Days of Exercise per Week: 0 days    Minutes of Exercise per Session: 0 min  Stress: No Stress Concern Present (01/19/2022)   [place]    Feeling of Stress : Not at all  Social Connections: Moderately Integrated (01/19/2022)   Social Connection and Isolation Panel [NHANES]    Frequency of Communication with Friends and Family: More than three times a week    Frequency of  Social Gatherings with Friends and Family: More than three times a week    Attends Religious Services: More than 4 times per year    Active Member of [employer] or Organizations: Yes    Attends Archivist Meetings: More than 4 times per year    Marital Status: Widowed  Intimate Partner Violence: Not At Risk (01/19/2022)   Humiliation, Afraid, Rape, and Kick questionnaire    Fear of Current or Ex-Partner: No    Emotionally Abused: No    Physically Abused: No    Sexually Abused: No    Past Surgical History:  Procedure Laterality Date   ABDOMINAL HYSTERECTOMY     APPENDECTOMY     BREAST LUMPECTOMY Left    CATARACT EXTRACTION     ESOPHAGEAL MANOMETRY N/A 01/13/2013   Procedure: ESOPHAGEAL MANOMETRY (EM);  Surgeon: Sable Feil, MD;  Location: [facility];  Service: Endoscopy;  Laterality: N/A;   ESOPHAGOGASTRODUODENOSCOPY (EGD) WITH PROPOFOL N/A 10/30/2020   Procedure: ESOPHAGOGASTRODUODENOSCOPY (EGD) WITH PROPOFOL;  Surgeon: Mauri Pole, MD;  Location: [facility] ENDOSCOPY;  Service: Endoscopy;  Laterality: N/A;   FOREIGN BODY REMOVAL  10/30/2020   Procedure: FOREIGN BODY REMOVAL;  Surgeon: Mauri Pole, MD;  Location: [place];  Service: Endoscopy;;   HERNIA REPAIR  08/2009   HOT HEMOSTASIS N/A 10/30/2020   Procedure: HOT HEMOSTASIS (ARGON PLASMA COAGULATION/BICAP);  Surgeon: Mauri Pole, MD;  Location: [facility] ENDOSCOPY;  Service: Endoscopy;  Laterality: N/A;   MASTECTOMY Right    SPINE SURGERY     TOTAL HIP ARTHROPLASTY Left     Family History  Problem Relation Age of Onset   Cancer Mother        Angio sarcoma    Heart disease Father    Hypertension Father    Stroke Father        x2   Heart attack Father        x 11   Hernia Brother    Hypertension Brother    Neuropathy Neg Hx    Restless legs syndrome Neg Hx    Colon cancer Neg Hx    Esophageal cancer Neg Hx    Pancreatic cancer Neg Hx    Stomach cancer Neg Hx     Allergies  Allergen Reactions    Diflunisal     REACTION: rash , swelling  anaphlaxsis   Lipitor [Atorvastatin]     Pt had body joint pain   Statins     Myalgia     Tetanus Toxoid     REACTION: arm swelling, fever  Current Outpatient Medications on File Prior to Visit  Medication Sig Dispense Refill   acetaminophen (TYLENOL) 500 MG tablet Take 1,000 mg by mouth every 6 (six) hours as needed for moderate pain or headache.     ALPRAZolam (XANAX) 1 MG tablet Take 0.5-1 tablets (0.5-1 mg total) by mouth 2 (two) times daily as needed for anxiety. 30 tablet 1   atenolol (TENORMIN) 50 MG tablet Take 1 tablet (50 mg total) by mouth daily. 90 tablet 3   bevacizumab (AVASTIN) 400 MG/16ML SOLN Inject 400 mg into the vein once. 16 ML     clopidogrel (PLAVIX) 75 MG tablet Take 1 tablet (75 mg total) by mouth daily. 90 tablet 3   ferrous sulfate 325 (65 FE) MG tablet Take by mouth.     gabapentin (NEURONTIN) 300 MG capsule Take 2 capsules (600 mg total) by mouth at bedtime. 60 capsule 11   Multiple Vitamins-Minerals (CENTRUM SILVER 50+WOMEN) TABS Take 1 tablet by mouth daily.     Multiple Vitamins-Minerals (PRESERVISION AREDS 2 PO) Take 1 tablet by mouth in the morning and at bedtime.     nitroGLYCERIN (NITROSTAT) 0.4 MG SL tablet PLACE ONE TABLET UNDER THE TONGUE EVERY 5 MINUTES AS NEEDED. 25 tablet 12   NONFORMULARY OR COMPOUNDED ITEM as needed. Formula 25: Doxepin 3% Amantadine 3% Dextromethorphan 2% Lidocaine 2%       pantoprazole (PROTONIX) 40 MG tablet Take 1 tablet (40 mg total) by mouth 2 (two) times daily. Please keep your upcoming February appointment for further refills. Thank you 60 tablet 0   Polyethyl Glycol-Propyl Glycol (SYSTANE OP) Place 1 drop into both eyes daily as needed (dry eyes).     sucralfate (CARAFATE) 1 g tablet Take 1 tablet (1 g total) by mouth 4 (four) times daily. Please take medication 2 hours before or after other medication 120 tablet 0   No current facility-administered medications on file  prior to visit.    BP 100/70   Pulse 75   Temp 97.7 F (36.5 C) (Oral)   Ht 5' 7"$  (1.702 m)   Wt 181 lb (82.1 kg)   SpO2 97%   BMI 28.35 kg/m       Objective:   Physical Exam Constitutional:      Appearance: Normal appearance. She is normal weight.  Cardiovascular:     Rate and Rhythm: Normal rate.     Heart sounds: Murmur heard.  Musculoskeletal:        General: Normal range of motion.  Skin:    General: Skin is warm and dry.  Neurological:     General: No focal deficit present.     Mental Status: She is alert and oriented to person, place, and time.  Psychiatric:        Mood and Affect: Mood normal.        Behavior: Behavior normal.        Thought Content: Thought content normal.        Judgment: Judgment normal.       Assessment & Plan:  1. Iron deficiency anemia due to chronic blood loss-  - I am glad she is starting to feel better. Will recheck levels for her today. Follow up with GI and hematology as directed - CBC with Differential/Platelet; Future - IBC + Ferritin; Future  Dorothyann Peng, NP

## 2022-06-15 DIAGNOSIS — H353221 Exudative age-related macular degeneration, left eye, with active choroidal neovascularization: Secondary | ICD-10-CM | POA: Diagnosis not present

## 2022-06-15 DIAGNOSIS — Z961 Presence of intraocular lens: Secondary | ICD-10-CM | POA: Diagnosis not present

## 2022-06-15 DIAGNOSIS — H43813 Vitreous degeneration, bilateral: Secondary | ICD-10-CM | POA: Diagnosis not present

## 2022-06-15 DIAGNOSIS — Z8669 Personal history of other diseases of the nervous system and sense organs: Secondary | ICD-10-CM | POA: Diagnosis not present

## 2022-06-15 DIAGNOSIS — H353112 Nonexudative age-related macular degeneration, right eye, intermediate dry stage: Secondary | ICD-10-CM | POA: Diagnosis not present

## 2022-06-15 DIAGNOSIS — S0510XD Contusion of eyeball and orbital tissues, unspecified eye, subsequent encounter: Secondary | ICD-10-CM | POA: Diagnosis not present

## 2022-06-15 DIAGNOSIS — Z853 Personal history of malignant neoplasm of breast: Secondary | ICD-10-CM | POA: Diagnosis not present

## 2022-06-20 ENCOUNTER — Other Ambulatory Visit: Payer: Self-pay

## 2022-06-20 ENCOUNTER — Encounter: Payer: Self-pay | Admitting: Gastroenterology

## 2022-06-20 ENCOUNTER — Ambulatory Visit (INDEPENDENT_AMBULATORY_CARE_PROVIDER_SITE_OTHER): Payer: Medicare Other | Admitting: Gastroenterology

## 2022-06-20 VITALS — BP 100/70 | HR 70 | Ht 67.0 in | Wt 180.0 lb

## 2022-06-20 DIAGNOSIS — D509 Iron deficiency anemia, unspecified: Secondary | ICD-10-CM | POA: Diagnosis not present

## 2022-06-20 DIAGNOSIS — R899 Unspecified abnormal finding in specimens from other organs, systems and tissues: Secondary | ICD-10-CM

## 2022-06-20 DIAGNOSIS — Z79899 Other long term (current) drug therapy: Secondary | ICD-10-CM | POA: Diagnosis not present

## 2022-06-20 DIAGNOSIS — R5383 Other fatigue: Secondary | ICD-10-CM

## 2022-06-20 DIAGNOSIS — K2100 Gastro-esophageal reflux disease with esophagitis, without bleeding: Secondary | ICD-10-CM

## 2022-06-20 DIAGNOSIS — R0600 Dyspnea, unspecified: Secondary | ICD-10-CM | POA: Diagnosis not present

## 2022-06-20 DIAGNOSIS — Z7902 Long term (current) use of antithrombotics/antiplatelets: Secondary | ICD-10-CM | POA: Diagnosis not present

## 2022-06-20 DIAGNOSIS — K269 Duodenal ulcer, unspecified as acute or chronic, without hemorrhage or perforation: Secondary | ICD-10-CM | POA: Diagnosis not present

## 2022-06-20 NOTE — Progress Notes (Signed)
HPI :  82 year old female here to follow-up for iron deficiency, esophagitis, duodenal ulcers.  She has a complicated history as outlined below.  Recall that she has had problems with iron deficiency anemia in recent years.  10/30/2020 EGD with Dr. Silverio Decamp at the hospital for GI bleed with large hiatal hernia, food in the cardia, gastric Cameron erosions with no bleeding and a single angiectasia in the duodenum treated with APC.  Patient told to stay on Pantoprazole 40 mg p.o. twice daily for 3 months and then once daily. 06/15/2021 iron studies with a low ferritin at 8.5% saturation 24%.  CBC with normal hemoglobin. 07/20/2021 colonoscopy with internal hemorrhoids and otherwise normal.  No further repeat needed due to age (Dr. Ardis Hughs)  Unfortunately over time she has had some recurrent iron deficiency anemia in recent months  She had been on pantoprazole 40 mg twice daily and endorses compliance with it.  She has not been using much of NSAIDs.  She has had some intermittent dark stools when she saw [place] this past fall.  We repeated an upper endoscopy which was done on November 17.  She had a 6 cm hiatal hernia noted, LA grade B esophagitis despite twice daily PPI, she also had multiple small duodenal ulcers despite twice daily PPI.  Biopsies were negative for H. pylori.  Her gastrin level was normal.  I increased her Protonix to 80 mg twice daily for 6 weeks, she was given Carafate to take a few times daily, she was continuing oral iron pills.  She had a slight drift in her hemoglobin to high 11's and then to 10.5 when checked at [facility] hematology clinic in January.  Her ferritin was only 6 despite taking oral iron, she was she was given IV iron.  Her hemoglobin responded to this and as of February 9 was 12.0 with normal iron studies.  The patient does report since her last endoscopy she had 3 episodes of having dark stools.  Her last was actually in recent days.  She passed a formed black stool.  Not  tarry or sticky.  She is concerned she is having internal bleeding causing this. Her baseline stools she thinks are typically brown.  Again her hemoglobin just 11 days ago had improved.  She denies any NSAID use.  She has had some occasional epigastric pain for which she takes Tums as needed which may help.  She is also tried using Carafate as needed.  This usually can bother her about 20 minutes after eating and then will ease off after about 10 minutes or so.  She still has her gallbladder in place.  She reports she continues to feel quite fatigued.  The recent IV iron infusion she thinks has helped this.  She feels when she stands up quickly she will feel dizzy, she states her blood pressure has been fluctuating.  She does endorse exertional dyspnea with even light exertion.  She does not have any chest pains.  She has seen Dr. Harrell Gave of cardiology in the past and had a cardiac CT within the past year or 2.  Her last echo was in 2017.  She attributes her dyspnea to anemia although this has not improved despite normalization of her hemoglobin.  She states her hemoglobin is "normally 14 to 15" and that hemoglobin of 12 remains abnormal for her.  Recall she is also been followed by ophthalmology for Avastin eye injections.  She asked her ophthalmologist if this could be related to her bleeding  symptoms and they did not think so given the concentration systemically was so low.  She does continue to take Plavix for her history of stroke.  Of note she has not been evaluated by her neurologist and she had rheumatic panel done which was positive for anti-PR-3 antibody.  She states she was referred to rheumatology at [facility] but they have no openings to see her anytime soon and she is rather frustrated by this.  She is currently scheduled to see Dr. Harrell Gave in March.  She remains quite frustrated that her symptoms of fatigue and dyspnea persist.  Most recent workup: EGD 03/17/22: Esophagogastric landmarks  were identified: the                            Z-line was found at 31 cm, the gastroesophageal                            junction was found at 31 cm and the upper extent of                            the gastric folds was found at 37 cm from the                            incisors.                           A 6 cm hiatal hernia was present.                           LA Grade B esophagitis was found.                           The exam of the esophagus was otherwise normal.                           The entire examined stomach was normal. Biopsies                            were taken with a cold forceps for Helicobacter                            pylori testing.                           Few non-bleeding superficial small duodenal ulcers                            / erosions with no stigmata of bleeding were found                            in the distal duodenal bulb and sweep                           A diverticulum was found in the second portion of  the duodenum.                           A single diminutive angiodysplastic lesion without                            bleeding was found in the second portion of the                            duodenum.                           The exam of the duodenum was otherwise normal.   Surgical [P], gastric bx - ANTRAL AND OXYNTIC MUCOSAE WITH MILD REACTIVE GASTROPATHY. - NEGATIVE FOR HELICOBACTER ORGANISMS ON H&E STAIN.   Lab Results  Component Value Date   WBC 6.7 06/09/2022   HGB 12.0 06/09/2022   HCT 36.8 06/09/2022   MCV 83.6 06/09/2022   PLT 272.0 06/09/2022    Lab Results  Component Value Date   IRON 64 06/09/2022   TIBC 305.2 06/09/2022   FERRITIN 271.0 06/09/2022       Past Medical History:  Diagnosis Date   Anemia    Has had iron infusions   Breast cancer ([place])    right   Cerebrovascular disease 11/12/2018   Depression    Depression    GERD (gastroesophageal reflux disease)    Hiatal  hernia    History of GI bleed    Hx of mitral valve prolapse    Hyperlipidemia    Irregular heart beat    Neuropathy    numbness in both feet   Nocturnal leg cramps 09/02/2015   Optic neuritis    left   Osteopenia    Ovarian cyst    Parotid tumor    right   Pelvic fracture (HCC)    Restless legs syndrome    Shingles 2000   Left in V1   Stroke [facility])      Past Surgical History:  Procedure Laterality Date   ABDOMINAL HYSTERECTOMY     APPENDECTOMY     BREAST LUMPECTOMY Left    CATARACT EXTRACTION     ESOPHAGEAL MANOMETRY N/A 01/13/2013   Procedure: ESOPHAGEAL MANOMETRY (EM);  Surgeon: Sable Feil, MD;  Location: [facility] ENDOSCOPY;  Service: Endoscopy;  Laterality: N/A;   ESOPHAGOGASTRODUODENOSCOPY (EGD) WITH PROPOFOL N/A 10/30/2020   Procedure: ESOPHAGOGASTRODUODENOSCOPY (EGD) WITH PROPOFOL;  Surgeon: Mauri Pole, MD;  Location: [place] ENDOSCOPY;  Service: Endoscopy;  Laterality: N/A;   FOREIGN BODY REMOVAL  10/30/2020   Procedure: FOREIGN BODY REMOVAL;  Surgeon: Mauri Pole, MD;  Location: [place];  Service: Endoscopy;;   HERNIA REPAIR  08/2009   HOT HEMOSTASIS N/A 10/30/2020   Procedure: HOT HEMOSTASIS (ARGON PLASMA COAGULATION/BICAP);  Surgeon: Mauri Pole, MD;  Location: [facility] ENDOSCOPY;  Service: Endoscopy;  Laterality: N/A;   MASTECTOMY Right    SPINE SURGERY     TOTAL HIP ARTHROPLASTY Left    Family History  Problem Relation Age of Onset   Cancer Mother        Angio sarcoma    Heart disease Father    Hypertension Father    Stroke Father        x2   Heart attack Father        x 59   Hernia Brother  Hypertension Brother    Neuropathy Neg Hx    Restless legs syndrome Neg Hx    Colon cancer Neg Hx    Esophageal cancer Neg Hx    Pancreatic cancer Neg Hx    Stomach cancer Neg Hx    Social History   Tobacco Use   Smoking status: Never   Smokeless tobacco: Never  Vaping Use   Vaping Use: Never used  Substance Use Topics   Alcohol use: Yes     Alcohol/week: 1.0 standard drink of alcohol    Types: 1 Glasses of wine per week    Comment: Rarely,socially   Drug use: No   Current Outpatient Medications  Medication Sig Dispense Refill   acetaminophen (TYLENOL) 500 MG tablet Take 1,000 mg by mouth every 6 (six) hours as needed for moderate pain or headache.     ALPRAZolam (XANAX) 1 MG tablet Take 0.5-1 tablets (0.5-1 mg total) by mouth 2 (two) times daily as needed for anxiety. 30 tablet 1   atenolol (TENORMIN) 50 MG tablet Take 1 tablet (50 mg total) by mouth daily. 90 tablet 3   bevacizumab (AVASTIN) 400 MG/16ML SOLN Inject 400 mg into the vein once. 16 ML     clopidogrel (PLAVIX) 75 MG tablet Take 1 tablet (75 mg total) by mouth daily. 90 tablet 3   ferrous sulfate 325 (65 FE) MG tablet Take by mouth.     gabapentin (NEURONTIN) 300 MG capsule Take 2 capsules (600 mg total) by mouth at bedtime. 60 capsule 11   Multiple Vitamins-Minerals (CENTRUM SILVER 50+WOMEN) TABS Take 1 tablet by mouth daily.     Multiple Vitamins-Minerals (PRESERVISION AREDS 2 PO) Take 1 tablet by mouth in the morning and at bedtime.     nitroGLYCERIN (NITROSTAT) 0.4 MG SL tablet PLACE ONE TABLET UNDER THE TONGUE EVERY 5 MINUTES AS NEEDED. 25 tablet 12   NONFORMULARY OR COMPOUNDED ITEM as needed. Formula 25: Doxepin 3% Amantadine 3% Dextromethorphan 2% Lidocaine 2%       pantoprazole (PROTONIX) 40 MG tablet Take 1 tablet (40 mg total) by mouth 2 (two) times daily. Please keep your upcoming February appointment for further refills. Thank you 60 tablet 0   Polyethyl Glycol-Propyl Glycol (SYSTANE OP) Place 1 drop into both eyes daily as needed (dry eyes).     sucralfate (CARAFATE) 1 g tablet Take 1 tablet (1 g total) by mouth 4 (four) times daily. Please take medication 2 hours before or after other medication 120 tablet 0   No current facility-administered medications for this visit.   Allergies  Allergen Reactions   Diflunisal     REACTION: rash ,  swelling  anaphlaxsis   Lipitor [Atorvastatin]     Pt had body joint pain   Statins     Myalgia     Tetanus Toxoid     REACTION: arm swelling, fever     Review of Systems: All systems reviewed and negative except where noted in HPI.       Latest Ref Rng & Units 06/09/2022   12:03 PM 04/17/2022   12:26 PM 03/29/2022   10:45 AM  CBC  WBC 4.0 - 10.5 K/uL 6.7  5.3  7.4   Hemoglobin 12.0 - 15.0 g/dL 12.0  11.7  11.4   Hematocrit 36.0 - 46.0 % 36.8  35.8  34.7   Platelets 150.0 - 400.0 K/uL 272.0  277.0  266.0     Lab Results  Component Value Date   CREATININE 0.69 03/10/2022  BUN 20 03/10/2022   NA 142 03/10/2022   K 4.7 03/10/2022   CL 107 03/10/2022   CO2 31 03/10/2022    . Lab Results  Component Value Date   ALT 15 03/29/2022   AST 18 03/29/2022   ALKPHOS 84 03/29/2022   BILITOT 0.3 03/29/2022     Physical Exam: BP 100/70   Pulse 70   Ht 5' 7"$  (1.702 m)   Wt 180 lb (81.6 kg)   SpO2 98%   BMI 28.19 kg/m  Constitutional: Pleasant,well-developed, female in no acute distress. Neurological: Alert and oriented to person place and time. Skin: Skin is warm and dry. No rashes noted. Psychiatric: Normal mood and affect. Behavior is normal.   ASSESSMENT: 82 y.o. female here for assessment of the following  1. Iron deficiency anemia, unspecified iron deficiency anemia type   2. Antiplatelet or antithrombotic long-term use   3. Gastroesophageal reflux disease with esophagitis, unspecified whether hemorrhage   4. Duodenal ulcer   5. Long-term current use of proton pump inhibitor therapy   6. Fatigue, unspecified type   7. Dyspnea, unspecified type   8. Abnormal laboratory test    Recurrent iron deficiency anemia.  Colonoscopy has been negative in the past year.  She has had a large hiatal hernia with Lysbeth Galas lesions in the past, most recently erosive esophagitis and some small duodenal ulcers.  Surprising that this occurred despite Protonix 40 mg twice daily.   She tested negative for H. pylori, she does not use NSAIDs.  She has been on higher dose PPI and Carafate, she has required some additional IV iron infusions which fortunately has recently normalized her hemoglobin.  We discussed her constellation of symptoms.  Her fatigue and dyspnea seem out of proportion to her level of anemia, and counseled her that her recent hemoglobin, while may be lower than her prior normal, it is within a normal range and should not really be causing the extent of fatigue and dyspnea she is having.  I do recommend that she see her cardiologist to further evaluate her dyspnea.  Her blood pressure is on the low side today and she has perhaps some orthostatic symptoms, she should have her blood pressure regimen addressed as well.  Moving forward, she does have some intermittent upper abdominal discomfort.  Given her recurrent iron deficiency, history of ulcers despite higher dose PPI, I am recommending a repeat EGD to reassess her stomach, ensure mucosal healing of the ulcers and esophagitis on higher dose PPI and Carafate.  We will plan to do so as soon as she has completed her pending cardiology evaluation.  I asked her to contact me once that has been completed and I can get her added to the schedule for an upper endoscopy.  Further, if her iron deficiency persists, and her upper endoscopy shows mucosal healing, we then may need to consider a capsule endoscopy to clear her small intestine and assess for ulcerations further distal in her bowel, rule out enteropathy/IBD etc.  She is agreeable to this.  I offered her a right upper quadrant ultrasound to screen her gallbladder for gallstones in light of her upper abdominal symptoms, she wants to hold off on that for now and await her EGD.  We discussed her dark stools at length.  She is not having typical symptoms of melena, and I counseled her that dark stools can occur while taking Carafate and iron supplementation.  It is really  hard to tell how much blood is in  her stool when taking those, however she is not having typical symptoms of melena and her recent hemoglobin was higher than previous.  She has discussed her Avastin use with ophthalmology and they do not think that her eye injections with Avastin would interfere with her bleeding risk systemically.  She has maintained on Plavix with stable hemoglobin recently and she should continue to do so given her history of stroke.  Finally she had several questions about her rheumatic panel that was sent recently and the positive antibody that raises the suspicion for Wegener's.  I am going to refer her to rheumatology to have that further evaluated, I think they can get her in here locally rather than waiting several months to be seen at [facility].  She asked my opinion on that particular lab, I do not routinely order that in my practice and would defer to rheumatology to determine if she needs further workup for that.  Patient agrees with the plan as outlined.  If she has any persistent dark stools, that are more concerning for melena as outlined with her, she needs to contact me and we will repeat labs in that situation to make sure she is not having true bleeding.  Otherwise we will continue her regimen and I will wait to hear back from her in regards to schedule her endoscopy in the near future.  PLAN: - continue protonix 52m BID - continue carafate PRN - continue iron - f/u labs and iron infusion with hematology - recommend she f/u cardiology for dyspnea and her BP regimen. Once cleared per cardiology will proceed with EGD and consider capsule endoscopy - she will continue Plavix as long as Hgb is stable - refer to Rheumatology as outlined above - Avastin okay to continue per optho  I spent 60 minutes of time, including in depth chart review, face-to-face time with the patient, and documentation.  SJolly Mango MD [facility]

## 2022-06-20 NOTE — Patient Instructions (Addendum)
If your blood pressure at your visit was 140/90 or greater, please contact your primary care physician to follow up on this.  _______________________________________________________  If you are age 80 or older, your body mass index should be between 23-30. Your Body mass index is 28.19 kg/m. If this is out of the aforementioned range listed, please consider follow up with your Primary Care Provider.  If you are age 85 or younger, your body mass index should be between 19-25. Your Body mass index is 28.19 kg/m. If this is out of the aformentioned range listed, please consider follow up with your Primary Care Provider.   ________________________________________________________  The [place] [facility] providers would like to encourage you to use [facility] to communicate with providers for non-urgent requests or questions.  Due to long hold times on the telephone, sending your provider a message by [facility] may be a faster and more efficient way to get a response.  Please allow 48 business hours for a response.  Please remember that this is for non-urgent requests.  _______________________________________________________  We are referring you to Rheumatology.  They will contact you directly to schedule an appointment.  It may take a week or more before you hear from them.  Please feel free to contact us if you have not heard from them within 2 weeks and we will follow up on the referral.    Continue Protonix, Carafate, iron  Please follow up with Cardiology for Dyspnea. Once cleared we will discuss scheduling endoscopic procedures. Please contact us once this is done.  You can cancel your appointment with [facility].  Thank you for entrusting me with your care and for choosing [facility], Dr. McCracken Cellar

## 2022-06-22 ENCOUNTER — Telehealth: Payer: Self-pay | Admitting: Cardiology

## 2022-06-22 NOTE — Telephone Encounter (Signed)
Spoke with patient and she was wanting to know if needs lipid checked prior to appointment next week with Dr Harrell Gave  Advised patient to just come to visit next week, had lipids in November. Dr Harrell Gave aware

## 2022-06-22 NOTE — Telephone Encounter (Signed)
  Pt c/o medication issue:  1. Name of Medication: Nexlizet   2. How are you currently taking this medication (dosage and times per day)?   3. Are you having a reaction (difficulty breathing--STAT)?   4. What is your medication issue? Pt made an appt with Dr. Harrell Gave on 02/294 and wanted to ask if she needs to take this medication prior her appt with her. She said to call her at [phone]

## 2022-06-26 ENCOUNTER — Telehealth: Payer: Self-pay | Admitting: Gastroenterology

## 2022-06-26 NOTE — Telephone Encounter (Signed)
PT called to check  status on referral to rheumatologist. Advised her that it was just recently submitted and she would be getting a call once they can accommodate her.

## 2022-06-27 NOTE — Telephone Encounter (Signed)
Requested info faxed to Dr. Melissa Noon office at [phone]

## 2022-06-27 NOTE — Telephone Encounter (Signed)
Inbound call from [facility] with [facility] rheumatology requesting a demographic sheet on patient and also requesting labs from rheumatology  faxed over on.[address] to [place]   Fax number [phone]  Please advise. Thank you

## 2022-06-28 DIAGNOSIS — R55 Syncope and collapse: Secondary | ICD-10-CM

## 2022-06-28 HISTORY — DX: Syncope and collapse: R55

## 2022-06-29 ENCOUNTER — Telehealth: Payer: Self-pay | Admitting: Gastroenterology

## 2022-06-29 ENCOUNTER — Encounter (HOSPITAL_BASED_OUTPATIENT_CLINIC_OR_DEPARTMENT_OTHER): Payer: Self-pay | Admitting: Cardiology

## 2022-06-29 ENCOUNTER — Ambulatory Visit (INDEPENDENT_AMBULATORY_CARE_PROVIDER_SITE_OTHER): Payer: Medicare Other | Admitting: Cardiology

## 2022-06-29 VITALS — BP 100/70 | HR 67 | Ht 67.0 in | Wt 183.1 lb

## 2022-06-29 DIAGNOSIS — I2510 Atherosclerotic heart disease of native coronary artery without angina pectoris: Secondary | ICD-10-CM | POA: Diagnosis not present

## 2022-06-29 DIAGNOSIS — M791 Myalgia, unspecified site: Secondary | ICD-10-CM | POA: Diagnosis not present

## 2022-06-29 DIAGNOSIS — I10 Essential (primary) hypertension: Secondary | ICD-10-CM

## 2022-06-29 DIAGNOSIS — Z01810 Encounter for preprocedural cardiovascular examination: Secondary | ICD-10-CM | POA: Diagnosis not present

## 2022-06-29 DIAGNOSIS — Z8673 Personal history of transient ischemic attack (TIA), and cerebral infarction without residual deficits: Secondary | ICD-10-CM

## 2022-06-29 DIAGNOSIS — R011 Cardiac murmur, unspecified: Secondary | ICD-10-CM | POA: Diagnosis not present

## 2022-06-29 DIAGNOSIS — K922 Gastrointestinal hemorrhage, unspecified: Secondary | ICD-10-CM | POA: Diagnosis not present

## 2022-06-29 DIAGNOSIS — T466X5A Adverse effect of antihyperlipidemic and antiarteriosclerotic drugs, initial encounter: Secondary | ICD-10-CM | POA: Diagnosis not present

## 2022-06-29 NOTE — Progress Notes (Signed)
Cardiology Office Note:    Date:  06/29/2022   ID:  Shelley Coleman, Shelley Coleman Jan 20, 1941, MRN [id number]  PCP:  Shelley Peng, NP  Cardiologist:  Shelley Dresser, MD  Referring MD: Shelley Peng, NP   CC: Follow-up  History of Present Illness:    Shelley Coleman is a 82 y.o. female with a hx of hypertension, hyperlipidemia, aortic atherosclerosis, prior CVA, history of GI bleed 10/2020 who is seen for follow-up. I initially met her 06/02/2021 as a new consult at the request of Shelley Peng, NP for the evaluation and management of dyspnea on exertion.  CV risk factors: Has a strong family history of CAD. Father had 21 MI's (first age 47) and 2 CVA. Both brothers have stents. Mother died of angiosarcoma at age 62.  Former [employer].  She had a cardiac/coronary CT 06/16/21, showing a coronary calcium score of 440. And an aortic valve calcium score of 153. There was concern for a significant stenosis in the mRCA, and this was borderline on FFR. Previously developed severe body aches and myalgias on statin therapy. Now tolerating Leqvio (PCSK9i)  Today, she reports that she has struggled with GI bleeding off and on for 2 years. A few weeks ago she received an iron infusion at [facility]. Recently she was seen by Dr. Havery Coleman and reported melena. He planned for an urgent repeat EGD pending cardiology evaluation.  She denies any recent chest pain. She has had shortness of breath, and mild, audible wheezing with walking short distances such as to the mailbox. She endorses presyncope that occurs mostly when standing up and feeling lightheaded. At times she has come "very close" to passing out; she would relate her episodes to her GI bleeding and anemia. Slowly, she would be able to hold on and climb stairs. She denies being able to run a short distance. The most strenuous housework would be buying groceries. By the time she is finished she has to put her head on the counter due to her fatigue despite hanging  on to the cart.  Since starting Leqvio she has noticed significant improvement with her cholesterol management.  She denies any palpitations, chest pain, or peripheral edema. No headaches, orthopnea, or PND.   Past Medical History:  Diagnosis Date   Anemia    Has had iron infusions   Breast cancer ([place])    right   Cerebrovascular disease 11/12/2018   Depression    Depression    GERD (gastroesophageal reflux disease)    Hiatal hernia    History of GI bleed    Hx of mitral valve prolapse    Hyperlipidemia    Irregular heart beat    Neuropathy    numbness in both feet   Nocturnal leg cramps 09/02/2015   Optic neuritis    left   Osteopenia    Ovarian cyst    Parotid tumor    right   Pelvic fracture (HCC)    Restless legs syndrome    Shingles 2000   Left in V1   Stroke [facility])     Past Surgical History:  Procedure Laterality Date   ABDOMINAL HYSTERECTOMY     APPENDECTOMY     BREAST LUMPECTOMY Left    CATARACT EXTRACTION     ESOPHAGEAL MANOMETRY N/A 01/13/2013   Procedure: ESOPHAGEAL MANOMETRY (EM);  Surgeon: Sable Feil, MD;  Location: [facility];  Service: Endoscopy;  Laterality: N/A;   ESOPHAGOGASTRODUODENOSCOPY (EGD) WITH PROPOFOL N/A 10/30/2020   Procedure: ESOPHAGOGASTRODUODENOSCOPY (EGD) WITH PROPOFOL;  Surgeon: Mauri Pole, MD;  Location: [facility] ENDOSCOPY;  Service: Endoscopy;  Laterality: N/A;   FOREIGN BODY REMOVAL  10/30/2020   Procedure: FOREIGN BODY REMOVAL;  Surgeon: Mauri Pole, MD;  Location: [place];  Service: Endoscopy;;   HERNIA REPAIR  08/2009   HOT HEMOSTASIS N/A 10/30/2020   Procedure: HOT HEMOSTASIS (ARGON PLASMA COAGULATION/BICAP);  Surgeon: Mauri Pole, MD;  Location: [facility] ENDOSCOPY;  Service: Endoscopy;  Laterality: N/A;   MASTECTOMY Right    SPINE SURGERY     TOTAL HIP ARTHROPLASTY Left     Current Medications: Current Outpatient Medications on File Prior to Visit  Medication Sig   acetaminophen (TYLENOL) 500 MG  tablet Take 1,000 mg by mouth every 6 (six) hours as needed for moderate pain or headache.   ALPRAZolam (XANAX) 1 MG tablet Take 0.5-1 tablets (0.5-1 mg total) by mouth 2 (two) times daily as needed for anxiety.   atenolol (TENORMIN) 50 MG tablet Take 1 tablet (50 mg total) by mouth daily.   clopidogrel (PLAVIX) 75 MG tablet Take 1 tablet (75 mg total) by mouth daily.   ferrous sulfate 325 (65 FE) MG tablet Take by mouth.   gabapentin (NEURONTIN) 300 MG capsule Take 2 capsules (600 mg total) by mouth at bedtime.   Multiple Vitamins-Minerals (CENTRUM SILVER 50+WOMEN) TABS Take 1 tablet by mouth daily.   Multiple Vitamins-Minerals (PRESERVISION AREDS 2 PO) Take 1 tablet by mouth in the morning and at bedtime.   nitroGLYCERIN (NITROSTAT) 0.4 MG SL tablet PLACE ONE TABLET UNDER THE TONGUE EVERY 5 MINUTES AS NEEDED.   NONFORMULARY OR COMPOUNDED ITEM as needed. Formula 25: Doxepin 3% Amantadine 3% Dextromethorphan 2% Lidocaine 2%     pantoprazole (PROTONIX) 40 MG tablet Take 1 tablet (40 mg total) by mouth 2 (two) times daily. Please keep your upcoming February appointment for further refills. Thank you   Polyethyl Glycol-Propyl Glycol (SYSTANE OP) Place 1 drop into both eyes daily as needed (dry eyes).   sucralfate (CARAFATE) 1 g tablet Take 1 tablet (1 g total) by mouth 4 (four) times daily. Please take medication 2 hours before or after other medication   bevacizumab (AVASTIN) 400 MG/16ML SOLN Inject 400 mg into the vein once. 70 ML (Patient not taking: Reported on 06/29/2022)   No current facility-administered medications on file prior to visit.     Allergies:   Diflunisal, Lipitor [atorvastatin], Statins, and Tetanus toxoid   Social History   Tobacco Use   Smoking status: Never   Smokeless tobacco: Never  Vaping Use   Vaping Use: Never used  Substance Use Topics   Alcohol use: Yes    Alcohol/week: 1.0 standard drink of alcohol    Types: 1 Glasses of wine per week    Comment:  Rarely,socially   Drug use: No    Family History: family history includes Cancer in her mother; Heart attack in her father; Heart disease in her father; Hernia in her brother; Hypertension in her brother and father; Stroke in her father. There is no history of Neuropathy, Restless legs syndrome, Colon cancer, Esophageal cancer, Pancreatic cancer, or Stomach cancer.  Father had 52 MI (first age 32) and 2 CVA. Both brothers have stents. Mother died of angiosarcoma at age 61.   ROS:   Please see the history of present illness. (+) Fatigue/Malaise (+) Shortness of breath (+) Mild wheezing (+) Presyncope (+) Lightheadedness All other systems are reviewed and negative.    EKGs/Labs/Other Studies Reviewed:    The following  studies were reviewed today:  Cardiac CTA 06/16/2021: FINDINGS: Scan was triggered in the descending thoracic aorta. Axial non-contrast 3 mm slices were carried out through the heart. The data set was analyzed on a dedicated work station and scored using the [place]. Gantry rotation speed was 250 msecs and collimation was .6 mm. 0.8 mg of sl NTG was given. The 3D data set was reconstructed in 5% intervals of the 67-82 % of the R-R cycle. Diastolic phases were analyzed on a dedicated work station using MPR, MIP and VRT modes. The patient received 95 cc of contrast.   Aorta:  Normal size.  Aortic atherosclerosis.  No dissection.   Main Pulmonary Artery: Normal size of the pulmonary artery.   Aortic Valve:  Tri-leaflet.  Aortic valve calcium score 153.   Coronary Arteries:  Normal coronary origin.  Right dominance.   Coronary Calcium Score:   Left main: 0   Left anterior descending artery: 266   Left circumflex artery: 0   Right coronary artery: 174   Total: 440   Percentile: 80th for age, sex, and race matched control.   RCA is a large dominant artery that gives rise to PDA and PLA. Mild calcified plaque in the proximal RCA. Moderate soft  stenosis in the mid RCA. Moderate mixed stenosis in the distal RCA. Mild mixed plaques in PDA and PLA branches.   Left main is a large artery that gives rise to LAD and LCX arteries. Mild soft plaque in the distal vessel.   LAD is a large vessel that gives rise to two diagonal vessels. Mild mixed plaque in the proximal vessel. Moderate mixed plaque in the mid vessel. Mild calcified D1 disease. Mild calcified D2 disease before vessel bifurcation.   LCX is a non-dominant artery. There is no significant plaque. Very small vessel.   Other findings:   Normal pulmonary vein drainage into the left atrium.   Normal left atrial appendage without a thrombus.   Cannot exclude small PFO.   Extra-cardiac findings: See attached radiology report for non-cardiac structures.   IMPRESSION: 1. Coronary calcium score of 440. This was 80th percentile for age, sex, and race matched control.   2. Normal coronary origin with right dominance.   3. Aortic valve calcium score 153.   4. Aortic atherosclerosis.   5. CAD-RADS 3. Moderate stenosis RCA and LAD. Consider symptom-guided anti-ischemic pharmacotherapy as well as risk factor modification per guideline directed care. Additional analysis with CT FFR will be submitted.  Echo 2017 Left ventricle: The cavity size was normal. There was mild focal    basal hypertrophy of the septum. Systolic function was normal.    The estimated ejection fraction was in the range of 55% to 60%.    Wall motion was normal; there were no regional wall motion    abnormalities. Doppler parameters are consistent with abnormal    left ventricular relaxation (grade 1 diastolic dysfunction).   Impressions:   - Normal LV systolic function; grade 1 diastolic dysfunction;    trace TR.   EKG:  EKG is personally reviewed.   06/29/2022:  NSR at 67 bpm 01/25/2022: EKG was not ordered. 07/25/2021: EKG was not ordered. 06/02/21: NSR at 69 bpm  Recent Labs: 03/10/2022: BUN  20; Creatinine, Ser 0.69; Potassium 4.7; Sodium 142 03/29/2022: ALT 15 04/25/2022: TSH 1.370 06/09/2022: Hemoglobin 12.0; Platelets 272.0   Recent Lipid Panel    Component Value Date/Time   CHOL 146 03/29/2022 1045   CHOL 155 01/30/2022 0948  TRIG 228.0 (H) 03/29/2022 1045   HDL 58.20 03/29/2022 1045   HDL 57 01/30/2022 0948   CHOLHDL 3 03/29/2022 1045   VLDL 45.6 (H) 03/29/2022 1045   LDLCALC 74 01/30/2022 0948   LDLDIRECT 67.0 03/29/2022 1045    Physical Exam:    VS:  BP 100/70 Comment: at dr. Doyne Keel office this week  Pulse 67   Ht '5\' 7"'$  (1.702 m)   Wt 183 lb 1.6 oz (83.1 kg)   BMI 28.68 kg/m     Wt Readings from Last 3 Encounters:  06/29/22 183 lb 1.6 oz (83.1 kg)  06/20/22 180 lb (81.6 kg)  06/09/22 181 lb (82.1 kg)    GEN: Well nourished, well developed in no acute distress HEENT: Normal, moist mucous membranes NECK: No JVD CARDIAC: regular rhythm, normal S1 and S2, no rubs or gallops. 2/6 early peaking harsh systolic murmur. VASCULAR: Radial and DP pulses 2+ bilaterally. No carotid bruits RESPIRATORY:  Clear to auscultation without rales, wheezing or rhonchi  ABDOMEN: Soft, non-tender, non-distended MUSCULOSKELETAL:  Ambulates independently SKIN: Warm and dry, no edema NEUROLOGIC:  Alert and oriented x 3. No focal neuro deficits noted. PSYCHIATRIC:  Normal affect    ASSESSMENT:    1. Preop cardiovascular exam   2. History of CVA (cerebrovascular accident)   3. Essential hypertension   4. Coronary artery disease involving native coronary artery of native heart without angina pectoris   5. Myalgia due to statin   6. Gastrointestinal hemorrhage, unspecified gastrointestinal hemorrhage type   7. Murmur, cardiac     PLAN:    Preoperative cardiovascular exam GI bleeding, planned for endoscopy According to the Revised Cardiac Risk Index (RCRI), her Perioperative Risk of Major Cardiac Event is (%): 0.9  Her Functional Capacity in METs is: 5.07  according to the [place] Activity Status Index (DASI).  The patient is not currently having active cardiac symptoms, and they can achieve >4 METs of activity.  According to ACC/AHA Guidelines, no further testing is needed.  Proceed with surgery at acceptable risk.  Our service is available as needed in the peri-operative period.     Coronary artery disease, with borderline significant stenosis in the mRCA -based on results of CT coronary. Discussed that her fatigue with exertion may be stable angina. Reviewed when/how to use SL NG and when to call 911 -discussed medical management, indications for cardiac cath/stent  Hypertension -continue atenolol 50 mg daily. -has history of arrhythmia/PVC, reason atenolol was initially started, but found to have thyroid issues around the same time  History of CVA Hyperlipidemia Statin myalgia -had myalgia on statin, entire family has issues with them -on clopidogrel -now on Leqvio (PCSK9i), tolerating well  Murmur -echo to evaluate further  Cardiac risk counseling and prevention recommendations: -recommend heart healthy/Mediterranean diet, with whole grains, fruits, vegetable, fish, lean meats, nuts, and olive oil. Limit salt. -recommend moderate walking, 3-5 times/week for 30-50 minutes each session. Aim for at least 150 minutes.week. Goal should be pace of 3 miles/hours, or walking 1.5 miles in 30 minutes -recommend avoidance of tobacco products. Avoid excess alcohol.  Plan for follow up: 6 months or sooner as needed.  Shelley Dresser, MD, PhD, [facility]    Medication Adjustments/Labs and Tests Ordered: Current medicines are reviewed at length with the patient today.  Concerns regarding medicines are outlined above.   Orders Placed This Encounter  Procedures   EKG 12-Lead   ECHOCARDIOGRAM COMPLETE   No orders of the defined types  were placed in this encounter.  Patient Instructions  Medication Instructions:   Your physician recommends that you continue on your current medications as directed. Please refer to the Current Medication list given to you today.  *If you need a refill on your cardiac medications before your next appointment, please call your pharmacy*  Lab Work: NONE  Testing/Procedures: Your physician has requested that you have an echocardiogram. Echocardiography is a painless test that uses sound waves to create images of your heart. It provides your doctor with information about the size and shape of your heart and how well your heart's chambers and valves are working. This procedure takes approximately one hour. There are no restrictions for this procedure. Please do NOT wear cologne, perfume, aftershave, or lotions (deodorant is allowed). Please arrive 15 minutes prior to your appointment time.  Follow-Up: At [facility], you and your health needs are our priority.  As part of our continuing mission to provide you with exceptional heart care, we have created designated Provider Care Teams.  These Care Teams include your primary Cardiologist (physician) and Advanced Practice Providers (APPs -  Physician Assistants and Nurse Practitioners) who all work together to provide you with the care you need, when you need it.  We recommend signing up for the patient portal called "MyChart".  Sign up information is provided on this After Visit Summary.  MyChart is used to connect with patients for Virtual Visits (Telemedicine).  Patients are able to view lab/test results, encounter notes, upcoming appointments, etc.  Non-urgent messages can be sent to your provider as well.   To learn more about what you can do with MyChart, go to [email or web address].    Your next appointment:   6 month(s)  The format for your next appointment:   In Person  Provider:   Buford Dresser, MD       [facility],acting as a scribe for Shelley Dresser, MD.,have documented all  relevant documentation on the behalf of Shelley Dresser, MD,as directed by  Shelley Dresser, MD while in the presence of Shelley Dresser, MD.  I, Shelley Dresser, MD, have reviewed all documentation for this visit. The documentation on 06/29/22 for the exam, diagnosis, procedures, and orders are all accurate and complete.   Signed, Shelley Dresser, MD PhD 06/29/2022     [place]

## 2022-06-29 NOTE — Patient Instructions (Signed)
Medication Instructions:  Your physician recommends that you continue on your current medications as directed. Please refer to the Current Medication list given to you today.  *If you need a refill on your cardiac medications before your next appointment, please call your pharmacy*  Lab Work: NONE  Testing/Procedures: Your physician has requested that you have an echocardiogram. Echocardiography is a painless test that uses sound waves to create images of your heart. It provides your doctor with information about the size and shape of your heart and how well your heart's chambers and valves are working. This procedure takes approximately one hour. There are no restrictions for this procedure. Please do NOT wear cologne, perfume, aftershave, or lotions (deodorant is allowed). Please arrive 15 minutes prior to your appointment time.  Follow-Up: At [facility], you and your health needs are our priority.  As part of our continuing mission to provide you with exceptional heart care, we have created designated Provider Care Teams.  These Care Teams include your primary Cardiologist (physician) and Advanced Practice Providers (APPs -  Physician Assistants and Nurse Practitioners) who all work together to provide you with the care you need, when you need it.  We recommend signing up for the patient portal called "MyChart".  Sign up information is provided on this After Visit Summary.  MyChart is used to connect with patients for Virtual Visits (Telemedicine).  Patients are able to view lab/test results, encounter notes, upcoming appointments, etc.  Non-urgent messages can be sent to your provider as well.   To learn more about what you can do with MyChart, go to [email or web address].    Your next appointment:   6 month(s)  The format for your next appointment:   In Person  Provider:   Buford Dresser, MD

## 2022-06-29 NOTE — Telephone Encounter (Signed)
Inbound call from patient, states she went to go see her cardiologist today. Stated she was given clearance. Patient would like to proceed with EGD. Stated her cardiologist said they would fax over documentation in regards to clearance. Please advise.

## 2022-06-30 ENCOUNTER — Telehealth: Payer: Self-pay

## 2022-06-30 ENCOUNTER — Encounter: Payer: Self-pay | Admitting: Family Medicine

## 2022-06-30 ENCOUNTER — Other Ambulatory Visit: Payer: Self-pay | Admitting: Family Medicine

## 2022-06-30 ENCOUNTER — Ambulatory Visit (HOSPITAL_BASED_OUTPATIENT_CLINIC_OR_DEPARTMENT_OTHER)
Admission: RE | Admit: 2022-06-30 | Discharge: 2022-06-30 | Disposition: A | Discharge status: Home / Self Care | Payer: Medicare Other | Source: Ambulatory Visit | Attending: Family Medicine | Admitting: Family Medicine

## 2022-06-30 ENCOUNTER — Ambulatory Visit (INDEPENDENT_AMBULATORY_CARE_PROVIDER_SITE_OTHER): Payer: Medicare Other | Admitting: Family Medicine

## 2022-06-30 VITALS — BP 128/83 | HR 68 | Temp 97.5°F | Ht 67.0 in | Wt 182.4 lb

## 2022-06-30 DIAGNOSIS — K269 Duodenal ulcer, unspecified as acute or chronic, without hemorrhage or perforation: Secondary | ICD-10-CM

## 2022-06-30 DIAGNOSIS — D500 Iron deficiency anemia secondary to blood loss (chronic): Secondary | ICD-10-CM

## 2022-06-30 DIAGNOSIS — S8002XA Contusion of left knee, initial encounter: Secondary | ICD-10-CM

## 2022-06-30 DIAGNOSIS — M25562 Pain in left knee: Secondary | ICD-10-CM

## 2022-06-30 DIAGNOSIS — I951 Orthostatic hypotension: Secondary | ICD-10-CM | POA: Diagnosis not present

## 2022-06-30 DIAGNOSIS — Z7902 Long term (current) use of antithrombotics/antiplatelets: Secondary | ICD-10-CM

## 2022-06-30 DIAGNOSIS — I2510 Atherosclerotic heart disease of native coronary artery without angina pectoris: Secondary | ICD-10-CM

## 2022-06-30 DIAGNOSIS — M79645 Pain in left finger(s): Secondary | ICD-10-CM

## 2022-06-30 DIAGNOSIS — D509 Iron deficiency anemia, unspecified: Secondary | ICD-10-CM

## 2022-06-30 LAB — BASIC METABOLIC PANEL
BUN: 15 mg/dL (ref 6–23)
CO2: 33 mEq/L — ABNORMAL HIGH (ref 19–32)
Calcium: 10.2 mg/dL (ref 8.4–10.5)
Chloride: 102 mEq/L (ref 96–112)
Creatinine, Ser: 0.69 mg/dL (ref 0.40–1.20)
GFR: 82.22 mL/min (ref >60.00)
Glucose, Bld: 91 mg/dL (ref 70–99)
Potassium: 4.3 mEq/L (ref 3.5–5.1)
Sodium: 141 mEq/L (ref 135–145)

## 2022-06-30 LAB — CBC
HCT: 38.3 % (ref 36.0–46.0)
Hemoglobin: 12.4 g/dL (ref 12.0–15.0)
MCHC: 32.5 g/dL (ref 30.0–36.0)
MCV: 84.3 fl (ref 78.0–100.0)
Platelets: 278 10*3/uL (ref 150.0–400.0)
RBC: 4.54 Mil/uL (ref 3.87–5.11)
RDW: 19.8 % — ABNORMAL HIGH (ref 11.5–15.5)
WBC: 6 10*3/uL (ref 4.0–10.5)

## 2022-06-30 NOTE — Telephone Encounter (Signed)
See additional phone note dated 06/30/22. Patient has been scheduled for endoscopy.

## 2022-06-30 NOTE — Progress Notes (Signed)
OFFICE VISIT  06/30/2022  CC:  Chief Complaint  Patient presents with   Fall    Fall occurred this morning; still feeling lightheaded; denies headache or blurry vision    Patient is a 82 y.o. female who presents for fall d/t recent syncope.  HPI: A couple of hours ago she was helping her dog out of her car at the vet and states the next thing she realized she was on her hands and knees on the ground.  Has a left thumb abrasion and left knee abrasion. Does not recall feeling particularly dizzy but admits she chronically feels some mild malaise and fatigue due to chronic anemia. Has not had any recent acute illness. She has chronic/recurrent upper GI bleeding of unknown etiology.  Has not received RBC transfusion but did get iron infusion at [facility] a couple months ago.  She has melena intermittently, most recently a couple days last week.  She feels like she hydrates well. Denies any prior history of syncope but does endorse some intermittent brief periods of lightheadedness when standing up. She had a mildly low blood pressure at her cardiology visit yesterday for preop clearance for her EGD. She self discontinued her atenolol relatively recently due to some low blood pressures.  Currently she states she feels completely at baseline, no dizziness.   ROS as above, plus--> no fevers, no CP, no SOB, no wheezing, no cough,  no HAs, no rashes, no melena/hematochezia.  No polyuria or polydipsia.  No myalgias or arthralgias.  No focal weakness, paresthesias, or tremors.  No acute vision or hearing abnormalities.  No dysuria or unusual/new urinary urgency or frequency.  No recent changes in lower legs. No n/v/d or abd pain.  No palpitations.      Past Medical History:  Diagnosis Date   Anemia    Has had iron infusions   Breast cancer ([place])    right   Cerebrovascular disease 11/12/2018   Depression    Depression    GERD (gastroesophageal reflux disease)    Hiatal hernia    History of GI  bleed    Hx of mitral valve prolapse    Hyperlipidemia    Irregular heart beat    Neuropathy    numbness in both feet   Nocturnal leg cramps 09/02/2015   Optic neuritis    left   Osteopenia    Ovarian cyst    Parotid tumor    right   Pelvic fracture (HCC)    Restless legs syndrome    Shingles 2000   Left in V1   Stroke [facility])     Past Surgical History:  Procedure Laterality Date   ABDOMINAL HYSTERECTOMY     APPENDECTOMY     BREAST LUMPECTOMY Left    CATARACT EXTRACTION     ESOPHAGEAL MANOMETRY N/A 01/13/2013   Procedure: ESOPHAGEAL MANOMETRY (EM);  Surgeon: Sable Feil, MD;  Location: [facility] ENDOSCOPY;  Service: Endoscopy;  Laterality: N/A;   ESOPHAGOGASTRODUODENOSCOPY (EGD) WITH PROPOFOL N/A 10/30/2020   Procedure: ESOPHAGOGASTRODUODENOSCOPY (EGD) WITH PROPOFOL;  Surgeon: Mauri Pole, MD;  Location: [facility];  Service: Endoscopy;  Laterality: N/A;   FOREIGN BODY REMOVAL  10/30/2020   Procedure: FOREIGN BODY REMOVAL;  Surgeon: Mauri Pole, MD;  Location: [place];  Service: Endoscopy;;   HERNIA REPAIR  08/2009   HOT HEMOSTASIS N/A 10/30/2020   Procedure: HOT HEMOSTASIS (ARGON PLASMA COAGULATION/BICAP);  Surgeon: Mauri Pole, MD;  Location: [facility] ENDOSCOPY;  Service: Endoscopy;  Laterality: N/A;  MASTECTOMY Right    SPINE SURGERY     TOTAL HIP ARTHROPLASTY Left     Outpatient Medications Prior to Visit  Medication Sig Dispense Refill   acetaminophen (TYLENOL) 500 MG tablet Take 1,000 mg by mouth every 6 (six) hours as needed for moderate pain or headache.     ALPRAZolam (XANAX) 1 MG tablet Take 0.5-1 tablets (0.5-1 mg total) by mouth 2 (two) times daily as needed for anxiety. 30 tablet 1   atenolol (TENORMIN) 50 MG tablet Take 1 tablet (50 mg total) by mouth daily. 90 tablet 3   bevacizumab (AVASTIN) 400 MG/16ML SOLN Inject 400 mg into the vein once. 16 ML     clopidogrel (PLAVIX) 75 MG tablet Take 1 tablet (75 mg total) by mouth daily. 90 tablet 3    ferrous sulfate 325 (65 FE) MG tablet Take by mouth.     gabapentin (NEURONTIN) 300 MG capsule Take 2 capsules (600 mg total) by mouth at bedtime. 60 capsule 11   Multiple Vitamins-Minerals (CENTRUM SILVER 50+WOMEN) TABS Take 1 tablet by mouth daily.     Multiple Vitamins-Minerals (PRESERVISION AREDS 2 PO) Take 1 tablet by mouth in the morning and at bedtime.     nitroGLYCERIN (NITROSTAT) 0.4 MG SL tablet PLACE ONE TABLET UNDER THE TONGUE EVERY 5 MINUTES AS NEEDED. 25 tablet 12   NONFORMULARY OR COMPOUNDED ITEM as needed. Formula 25: Doxepin 3% Amantadine 3% Dextromethorphan 2% Lidocaine 2%       pantoprazole (PROTONIX) 40 MG tablet Take 1 tablet (40 mg total) by mouth 2 (two) times daily. Please keep your upcoming February appointment for further refills. Thank you 60 tablet 0   Polyethyl Glycol-Propyl Glycol (SYSTANE OP) Place 1 drop into both eyes daily as needed (dry eyes).     sucralfate (CARAFATE) 1 g tablet Take 1 tablet (1 g total) by mouth 4 (four) times daily. Please take medication 2 hours before or after other medication 120 tablet 0   No facility-administered medications prior to visit.    Allergies  Allergen Reactions   Diflunisal     REACTION: rash , swelling  anaphlaxsis   Lipitor [Atorvastatin]     Pt had body joint pain   Statins     Myalgia     Tetanus Toxoid     REACTION: arm swelling, fever    Review of Systems  As per HPI  PE:    06/30/2022    9:34 AM 06/29/2022    1:49 PM 06/29/2022    1:33 PM  Vitals with BMI  Height '5\' 7"'$   '5\' 7"'$   Weight 182 lbs 6 oz  183 lbs 2 oz  BMI [id number]  [id number]  Systolic 0000000 [id number] 0000000  Diastolic 83 70 86  Pulse 68  67     Physical Exam  Gen: Alert, well appearing.  Patient is oriented to person, place, time, and situation. AFFECT: pleasant, lucid thought and speech. [id number]: no injection, icteris, swelling, or exudate.  EOMI, PERRLA. Mouth: lips without lesion/swelling.  Oral mucosa pink and moist. Oropharynx without  erythema, exudate, or swelling.  CV: RRR, soft syst murmur, no diastolic murmur, no m/r/g.   LUNGS: CTA bilat, nonlabored resps, good aeration in all lung fields. ABD: soft, NT/ND EXT: no clubbing or cyanosis.  no edema.  Small abrasion on left thumb as well as on the left knee cap.  Mild left thumb tenderness to palpation at the base as well as mild anterior knee tenderness to palpation. Normal range of  motion of the thumb without instability, normal range of motion of the knee without instability.  No generalized swelling, erythema, or warmth. Neuro: CN 2-12 intact bilaterally, strength 5/5 in proximal and distal upper extremities and lower extremities bilaterally.   No tremor.    No ataxia.   SKIN: no jaundice or pallor  LABS:  Last CBC Lab Results  Component Value Date   WBC 6.7 06/09/2022   HGB 12.0 06/09/2022   HCT 36.8 06/09/2022   MCV 83.6 06/09/2022   MCH 22.9 (L) 11/01/2020   RDW 19.8 (H) 06/09/2022   PLT 272.0 123456   Last metabolic panel Lab Results  Component Value Date   GLUCOSE 93 03/10/2022   NA 142 03/10/2022   K 4.7 03/10/2022   CL 107 03/10/2022   CO2 31 03/10/2022   BUN 20 03/10/2022   CREATININE 0.69 03/10/2022   GFRNONAA >60 10/31/2020   CALCIUM 9.3 03/10/2022   PROT 6.3 04/25/2022   ALBUMIN 4.0 03/29/2022   LABGLOB 2.9 04/25/2022   BILITOT 0.3 03/29/2022   ALKPHOS 84 03/29/2022   AST 18 03/29/2022   ALT 15 03/29/2022   ANIONGAP 7 10/31/2020   Last hemoglobin A1c Lab Results  Component Value Date   HGBA1C 5.2 04/25/2022   IMPRESSION AND PLAN:  #1 orthostatic syncope. Suspect this is largely associated to her anemia due to chronic GI blood loss.  Low suspicion of any acute cardiopulmonary, neurologic, infectious, or other ominous etiology. Continue to hold atenolol. Check CBC today.  She has a plan for upper endoscopy with her gastroenterologist soon. She will maintain good hydration.  She will be careful when she stands up, wait for any  sensation of dizziness to cease before walking.  #2 left knee contusion and abrasion and left thumb sprain/abrasion. Really not clear how hard she hit these.  Exam reassuring. Will check radiographs of left thumb and left knee.  An After Visit Summary was printed and given to the patient.  FOLLOW UP: Return if symptoms worsen or fail to improve.  Signed:  Crissie Sickles, MD           06/30/2022

## 2022-06-30 NOTE — Telephone Encounter (Signed)
-----   Message from Yetta Flock, MD sent at 06/29/2022  9:39 PM EST ----- Herbert Seta this patient needs an EGD at the [facility]. Any openings in the next 3 weeks or so? If not let me know and I'll try to do a 730 somewhere.  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 Thanks   ----- Message ----- From: Buford Dresser, MD Sent: 06/29/2022   3:33 PM EST To: Dorothyann Peng, NP; Yetta Flock, MD

## 2022-06-30 NOTE — Telephone Encounter (Signed)
Pts son has called and stated pt has fallen this morning 06/30/2022. Pt did state she blacked out and does not know what caused her fall.  Pt is a pt at [facility] and was directed to call the [facility] location to get an apptmnt. We do not have any available apptmnts today. However, I was able to get pt in to see Dr. Anitra Lauth over at the [facility] location @ 9:40 am.  **Pt has no obvious head trauma at this time that the family can see, pt is awake and alert. Pt does not know if she has hit her head. Pt was talking and knew her name a/w her DOB at the time of this call. Pt did complain of sore shoulder, arm and side at this time.  **I transferred pt and son over to team health to be triaged before apptmnt today with Dr. Anitra Lauth @ 9:40 am.

## 2022-06-30 NOTE — Telephone Encounter (Signed)
Confirmed ok for change to DG left knee x-ray with provider. Pt was c/o left knee pain not right. Verbal order provided.  Carney Bern 11:07 AM IMPORTANT Celeste calling radiology on [address]

## 2022-06-30 NOTE — Telephone Encounter (Signed)
Called and spoke with patient. Patient has been scheduled for an EGD in the [facility] on 07/19/22 at 7:30 am (per Dr. Havery Moros). Pt knows to arrive in the [facility] by 7 am with a care partner. Pt knows to continue Plavix. Pt confirmed that she has access to MyChart and knows to expect her instructions there soon. Pt verbalized understanding and had no concerns at the end of the call.   Ambulatory referral to GI in epic.  EGD instructions sent to patient via [place].

## 2022-07-04 NOTE — Telephone Encounter (Signed)
Rec'd fax from [facility]. Patient has an appointment with Dr. Amil Amen on 08-14-22 at 1:30pm.  [phone]

## 2022-07-07 ENCOUNTER — Other Ambulatory Visit: Payer: Self-pay | Admitting: Physician Assistant

## 2022-07-07 ENCOUNTER — Other Ambulatory Visit: Payer: Self-pay | Admitting: Adult Health

## 2022-07-07 ENCOUNTER — Other Ambulatory Visit: Payer: Self-pay | Admitting: Gastroenterology

## 2022-07-07 DIAGNOSIS — F5101 Primary insomnia: Secondary | ICD-10-CM

## 2022-07-07 NOTE — Telephone Encounter (Signed)
Okay for refill?  

## 2022-07-11 ENCOUNTER — Ambulatory Visit (HOSPITAL_BASED_OUTPATIENT_CLINIC_OR_DEPARTMENT_OTHER): Payer: Medicare Other | Admitting: Cardiology

## 2022-07-12 DIAGNOSIS — R202 Paresthesia of skin: Secondary | ICD-10-CM | POA: Diagnosis not present

## 2022-07-12 DIAGNOSIS — E538 Deficiency of other specified B group vitamins: Secondary | ICD-10-CM | POA: Diagnosis not present

## 2022-07-12 DIAGNOSIS — D500 Iron deficiency anemia secondary to blood loss (chronic): Secondary | ICD-10-CM | POA: Diagnosis not present

## 2022-07-17 ENCOUNTER — Encounter: Payer: Self-pay | Admitting: Neurology

## 2022-07-17 ENCOUNTER — Encounter: Payer: Self-pay | Admitting: Cardiology

## 2022-07-17 ENCOUNTER — Ambulatory Visit (INDEPENDENT_AMBULATORY_CARE_PROVIDER_SITE_OTHER): Payer: Medicare Other | Admitting: Neurology

## 2022-07-17 VITALS — BP 124/78 | HR 80 | Ht 67.0 in | Wt 178.0 lb

## 2022-07-17 DIAGNOSIS — G2581 Restless legs syndrome: Secondary | ICD-10-CM | POA: Diagnosis not present

## 2022-07-17 DIAGNOSIS — D500 Iron deficiency anemia secondary to blood loss (chronic): Secondary | ICD-10-CM | POA: Diagnosis not present

## 2022-07-17 DIAGNOSIS — R5383 Other fatigue: Secondary | ICD-10-CM | POA: Diagnosis not present

## 2022-07-17 MED ORDER — GABAPENTIN 300 MG PO CAPS: 900.0000 mg | ORAL_CAPSULE | Freq: Every day | ORAL | 3 refills | Status: DC

## 2022-07-17 NOTE — Progress Notes (Signed)
Chief Complaint  Patient presents with   Follow-up    Rm 15, alone Reports no changes, states she is stable       ASSESSMENT AND PLAN  Shelley Coleman is a 82 y.o. female  Extreme fatigue, lack of stamina, Restless leg symptoms  History of GI bleeding in July 2022, require blood transfusion, recent occult blood positive x 3, with dropping of hemoglobin, had a blood transfusion again, pending GI workup including EGD  Chronic iron deficiency anemia due to chronic GI loss, could certainly worsening her restless leg symptoms, and her complaints of excessive fatigue, lack of stamina,  Higher dose of gabapentin up to 300 mg 3 tablets at nighttime for better control of her restless leg symptoms   At risk for obstructive sleep apnea    She does have narrow oropharyngeal space, nonrefreshing sleep every morning  Referred to sleep study,  Return To Clinic With NP In 6 Months   ,  DIAGNOSTIC DATA (LABS, IMAGING, TESTING) - I reviewed patient records, labs, notes, testing and imaging myself where available.  This MRI of the brain without contrast in Dec 2018. 1.    Chronic lacunar infarction involving the head of the left caudate and adjacent internal capsule, unchanged in appearance compared to the 11/01/2015 MRI. 2.    Chronic ischemic changes in the hemispheres, pons and thalamus, essentially unchanged when compared to the previous MRI.  3.    There are no acute findings and there are no changes in the brain compared to the MRI dated 11/01/2015    MEDICAL HISTORY:  Shelley Coleman is a 82 year old female, previous patient of Dr. Jannifer Franklin, primary care is nurse practitioner Carlisle Cater, Tommi Rumps,    I reviewed and summarized the referring note. PMHX Chronic insomnia Macular degeneration HLD Right Breast Cancer, s/p lobectomy more than 20 years ago, chemo/radiation therapy GI bleeding,  Left optic neuritis at age 59s. Stroke  Lumbar decompression surgery in past.  She was seen by Dr.  Jannifer Franklin before 2014, carried a diagnosis of restless leg syndrome, chronic severe insomnia, low ferritin level, previously has required IV iron dextran on, which does help her symptoms  Today, her main concern is overwhelming fatigue, lack of stamina, this has been ongoing since 2020, seen by different specialist, including GI, cardiologist  EGD on March 17, 2022 by GI physician Dr.  Cellar, 6 cm hiatal hernia, grade B esophagitis, stomach exam was normal, nonbleeding superficial small duodenal ulcer erosion, diverticulum was found in the second portion of duodenum,   Cardiology work up in Sept 2023, chart reviewed, coronary artery disease, with borderline significant stenosis in the [facility], her fatigue with exertion may be stable angina, suggest sublingual nitroglycerin as needed, hypertension that was well-controlled, history of CVA, hyperlipidemia, could not tolerate statin, myalgia, whole family have similar issue, received Leqvi infusion February 16, 2022,  She also reported history of left optic neuritis, many years ago, recovering well, COVID the diagnosis of macular degeneration now, receiving avastin injection periodically  She complains of overwhelming generalized fatigue, has not been able to bring back her trash can, it is too much for her to go to her driveway, and bring it back, she lives alone, still driving, but her children check on her frequently  She complains of many years history of intermittent double vision,  Laboratory evaluations 2023, hemoglobin of 11.7, LDL 67, gastrin level was 45 within normal limit, ferritin level was only 6.3, TIBC was elevated at 455  UPDATE July 17 2022: Continue complains of excessive fatigue, not feeling well, burning, bloating sensation, uncomfortable from throat to pelvic region, black stool, stool occult blood was positive x 3, pending GI EGD evaluation, taking Plavix with reported history of stroke, Personally reviewed MRI of the  brain from December 2018, chronic lacunar infarction in the head of left caudate, and internal capsule, moderate small vessel disease involving pons, thalamus, subcortical white matter   She was seen by [facility] on July 12 2022, with diagnosis of iron deficiency anemia, B12 was also low,  She had infusion early end of January 2024, not improvement of her stamina, reported 1 episode of syncope few weeks ago, stepping out of the car, taking her little dog for groomer, then passed out besides her car,  I reviewed hospital discharge in July 2022, she presented to the hospital complains of shortness of breath, weakness, found to have upper GI bleeding, with acute blood loss anemia, Hg 7.8,  incidental COVID-19 infection, required blood transfusion, EGD on July 2 showed Cameron lesion, AVMs-s/p APC, GI recommended continue PPI for 3 months, orthostatic dizziness within due to orthostatic hypotension, was managed with IV fluid, which has helped her symptoms,  Laboratory evaluation on July 12, 2022 from [facility], hemoglobin of 12.1, ferritin was 0000000, folic acid 0000000, [id number] 0000000, stool test was positive for occult bloodx3,   Extensive laboratory evaluation December 2023, hemoglobin of 11.7, normal TSH, B12 was within normal limit, 614, TSH, C-reactive protein, CPK, ESR, ANA, protein electrophoresis, A1c, autoimmune neurology laboratory evaluation, ER, acetylcholine receptor antibody, ANCA profile showed positive anti-PR-3 antibody,  Blood count dropped to 10.5 in January, ferritin was only 6, B12 dropped to 358, blood transfusion on May 19, 2022,  Her restless leg symptoms is under much better control with higher dose of gabapentin, tolerating it well, but sometimes to take as needed Xanax for her to sleep, complains of poor sleep quality, often wake up in the morning unrefreshed, she lives alone, was not sure if she is snoring, complains of excessive daytime fatigue, sleepiness too  PHYSICAL  EXAM:   Vitals:   07/17/22 1135  BP: 124/78  Pulse: 80  Weight: 178 lb (80.7 kg)  Height: 5\' 7"  (1.702 m)   Body mass index is 27.88 kg/m.  PHYSICAL EXAMNIATION:  Gen: NAD, conversant, well nourised, well groomed                     Cardiovascular: Regular rate rhythm, no peripheral edema, warm, nontender. Eyes: Conjunctivae clear without exudates or hemorrhage Neck: Supple, no carotid bruits. Pulmonary: Clear to auscultation bilaterally   NEUROLOGICAL EXAM:  MENTAL STATUS: Speech/cognition: Awake, alert, oriented to history taking and casual conversation CRANIAL NERVES: CN II: Visual fields are full to confrontation. Pupils are round equal and briskly reactive to light. CN III, IV, VI: extraocular movement are normal. No ptosis. CN V: Facial sensation is intact to light touch CN VII: Face is symmetric with normal eye closure  CN VIII: Hearing is normal to causal conversation. CN IX, X: Phonation is normal. CN XI: Head turning and shoulder shrug are intact CN XII: Narrow oropharyngeal space  MOTOR: Mild bilateral shoulder abduction, external rotation weakness,  REFLEXES: Reflexes are 1 and symmetric at the biceps, triceps, knees, and ankles. Plantar responses are flexor.  SENSORY: Intact to light touch, pinprick and vibratory sensation are intact in fingers and toes.  COORDINATION: There is no trunk or limb dysmetria noted.  GAIT/STANCE: Able to get up from seated  position with effort, cautious,  REVIEW OF SYSTEMS:  Full 14 system review of systems performed and notable only for as above All other review of systems were negative.   ALLERGIES: Allergies  Allergen Reactions   Diflunisal     REACTION: rash , swelling  anaphlaxsis   Lipitor [Atorvastatin]     Pt had body joint pain   Statins     Myalgia     Tetanus Toxoid     REACTION: arm swelling, fever    HOME MEDICATIONS: Current Outpatient Medications  Medication Sig Dispense Refill    acetaminophen (TYLENOL) 500 MG tablet Take 1,000 mg by mouth every 6 (six) hours as needed for moderate pain or headache.     ALPRAZolam (XANAX) 1 MG tablet TAKE 1/2 TO 1 (ONE-HALF TO ONE) TABLET BY MOUTH TWICE DAILY AS NEEDED FOR ANXIETY 30 tablet 0   atenolol (TENORMIN) 50 MG tablet Take 1 tablet (50 mg total) by mouth daily. 90 tablet 3   bevacizumab (AVASTIN) 400 MG/16ML SOLN Inject 400 mg into the vein once. 16 ML     clopidogrel (PLAVIX) 75 MG tablet Take 1 tablet (75 mg total) by mouth daily. 90 tablet 3   ferrous sulfate 325 (65 FE) MG tablet Take by mouth.     gabapentin (NEURONTIN) 300 MG capsule Take 2 capsules (600 mg total) by mouth at bedtime. 60 capsule 11   Multiple Vitamins-Minerals (CENTRUM SILVER 50+WOMEN) TABS Take 1 tablet by mouth daily.     Multiple Vitamins-Minerals (PRESERVISION AREDS 2 PO) Take 1 tablet by mouth in the morning and at bedtime.     nitroGLYCERIN (NITROSTAT) 0.4 MG SL tablet PLACE ONE TABLET UNDER THE TONGUE EVERY 5 MINUTES AS NEEDED. 25 tablet 12   NONFORMULARY OR COMPOUNDED ITEM as needed. Formula 25: Doxepin 3% Amantadine 3% Dextromethorphan 2% Lidocaine 2%       pantoprazole (PROTONIX) 40 MG tablet TAKE 1 TABLET BY MOUTH TWICE DAILY.  PLEASE  KEEP  UPCOMING  FEBRUARY  APPOINTMENT  FOR  FURTHER  REFILLS. 60 tablet 0   Polyethyl Glycol-Propyl Glycol (SYSTANE OP) Place 1 drop into both eyes daily as needed (dry eyes).     sucralfate (CARAFATE) 1 g tablet TAKE 1 TABLET BY MOUTH 4 TIMES DAILY PLEASE  TAKE  MEDICATION  2  HOURS  BEFORE  OR  AFTER  OTHER  MEDICATION 120 tablet 0   No current facility-administered medications for this visit.    PAST MEDICAL HISTORY: Past Medical History:  Diagnosis Date   Anemia    Has had iron infusions   Breast cancer ([place])    right   Cerebrovascular disease 11/12/2018   Depression    Depression    GERD (gastroesophageal reflux disease)    Hiatal hernia    History of GI bleed    Hx of mitral valve prolapse     Hyperlipidemia    Irregular heart beat    Neuropathy    numbness in both feet   Nocturnal leg cramps 09/02/2015   Optic neuritis    left   Osteopenia    Ovarian cyst    Parotid tumor    right   Pelvic fracture ([facility])    Restless legs syndrome    Shingles 2000   Left in V1   Stroke ([place])     PAST SURGICAL HISTORY: Past Surgical History:  Procedure Laterality Date   ABDOMINAL HYSTERECTOMY     APPENDECTOMY     BREAST LUMPECTOMY Left  CATARACT EXTRACTION     ESOPHAGEAL MANOMETRY N/A 01/13/2013   Procedure: ESOPHAGEAL MANOMETRY (EM);  Surgeon: Sable Feil, MD;  Location: [facility];  Service: Endoscopy;  Laterality: N/A;   ESOPHAGOGASTRODUODENOSCOPY (EGD) WITH PROPOFOL N/A 10/30/2020   Procedure: ESOPHAGOGASTRODUODENOSCOPY (EGD) WITH PROPOFOL;  Surgeon: Mauri Pole, MD;  Location: [facility];  Service: Endoscopy;  Laterality: N/A;   FOREIGN BODY REMOVAL  10/30/2020   Procedure: FOREIGN BODY REMOVAL;  Surgeon: Mauri Pole, MD;  Location: [facility];  Service: Endoscopy;;   HERNIA REPAIR  08/2009   HOT HEMOSTASIS N/A 10/30/2020   Procedure: HOT HEMOSTASIS (ARGON PLASMA COAGULATION/BICAP);  Surgeon: Mauri Pole, MD;  Location[facility] END[facility]COPY;  Service: Endoscopy;  Laterality: N/A;   MASTECTOMY Right    SPINE SURGERY     TOTAL HIP ARTHROPLASTY Left     FAMILY HISTORY: Family History  Problem Relation Age of Onset   Cancer Mother        Angio sarcoma    Heart disease Father    Hypertension Father    Stroke Father        x2   Heart attack Father        x 11   Hernia Brother    Hypertension Brother    Neuropathy Neg Hx    Restless legs syndrome Neg Hx    Colon cancer Neg Hx    Esophageal cancer Neg Hx    Pancreatic cancer Neg Hx    Stomach cancer Neg Hx     SOCIAL HISTORY: Social History   Socioeconomic History   Marital status: Married    Spouse name: Not on file   Number of children: 2   Years of education: 16   Highest education  level: Not on file  Occupational History   Occupation: Retired Therapist, sports  Tobacco Use   Smoking status: Never   Smokeless tobacco: Never  Vaping Use   Vaping Use: Never used  Substance and Sexual Activity   Alcohol use: Yes    Alcohol/week: 1.0 standard drink of alcohol    Types: 1 Glasses of wine per week    Comment: Rarely,socially   Drug use: No   Sexual activity: Not on file  Other Topics Concern   Not on file  Social History Narrative   Retired from nursing    Widowed    Left-handed      Social Determinants of Health   Financial Resource Strain: Low Risk  (01/19/2022)   Overall Financial Resource Strain (CARDIA)    Difficulty of Paying Living Expenses: Not hard at all  Food Insecurity: No Food Insecurity (01/19/2022)   Hunger Vital Sign    Worried About Running Out of Food in the Last Year: Never true    [place] in the Last Year: Never true  Transportation Needs: No Transportation Needs (01/19/2022)   PRAPARE - Hydrologist (Medical): No    Lack of Transportation (Non-Medical): No  Physical Activity: Inactive (01/19/2022)   Exercise Vital Sign    Days of Exercise per Week: 0 days    Minutes of Exercise per Session: 0 min  Stress: No Stress Concern Present (01/19/2022)   [place]    Feeling of Stress : Not at all  Social Connections: Moderately Integrated (01/19/2022)   Social Connection and Isolation Panel [NHANES]    Frequency of Communication with Friends and Family: More than three times a week  Frequency of Social Gatherings with Friends and Family: More than three times a week    Attends Religious Services: More than 4 times per year    Active Member of Clubs or Organizations: Yes    Attends Archivist Meetings: More than 4 times per year    Marital Status: Widowed  Intimate Partner Violence: Not At Risk (01/19/2022)   Humiliation, Afraid, Rape, and Kick  questionnaire    Fear of Current or Ex-Partner: No    Emotionally Abused: No    Physically Abused: No    Sexually Abused: No      Marcial Pacas, M.D. Ph.D.  [facility] [address], [place], [place] [address] Ph: [phone] Fax: [phone]  CC:  Dorothyann Peng, NP [place],  [place] [address]  Dorothyann Peng, NP

## 2022-07-18 ENCOUNTER — Telehealth: Payer: Self-pay | Admitting: Gastroenterology

## 2022-07-18 ENCOUNTER — Encounter: Payer: Medicare Other | Admitting: Neurology

## 2022-07-18 NOTE — Telephone Encounter (Signed)
Patient is requesting a call back asap she is schedule for a endo tomorrow at 7:30 and she said she never received instructions.Informed patient instruction were on Mychart  and she said is nothing in there .Please advise

## 2022-07-18 NOTE — Telephone Encounter (Signed)
Patient notified that instructions are sent again.  I reviewed the instructions as well

## 2022-07-19 ENCOUNTER — Ambulatory Visit (AMBULATORY_SURGERY_CENTER): Payer: Medicare Other | Admitting: Gastroenterology

## 2022-07-19 ENCOUNTER — Encounter: Payer: Medicare Other | Admitting: Neurology

## 2022-07-19 ENCOUNTER — Encounter: Payer: Self-pay | Admitting: Gastroenterology

## 2022-07-19 ENCOUNTER — Telehealth: Payer: Self-pay

## 2022-07-19 VITALS — BP 145/72 | HR 69 | Temp 98.0°F | Resp 10 | Ht 67.0 in | Wt 182.0 lb

## 2022-07-19 DIAGNOSIS — K209 Esophagitis, unspecified without bleeding: Secondary | ICD-10-CM

## 2022-07-19 DIAGNOSIS — K2950 Unspecified chronic gastritis without bleeding: Secondary | ICD-10-CM | POA: Diagnosis not present

## 2022-07-19 DIAGNOSIS — R195 Other fecal abnormalities: Secondary | ICD-10-CM | POA: Diagnosis not present

## 2022-07-19 DIAGNOSIS — K269 Duodenal ulcer, unspecified as acute or chronic, without hemorrhage or perforation: Secondary | ICD-10-CM

## 2022-07-19 DIAGNOSIS — D509 Iron deficiency anemia, unspecified: Secondary | ICD-10-CM | POA: Diagnosis not present

## 2022-07-19 DIAGNOSIS — E785 Hyperlipidemia, unspecified: Secondary | ICD-10-CM | POA: Diagnosis not present

## 2022-07-19 DIAGNOSIS — K2980 Duodenitis without bleeding: Secondary | ICD-10-CM | POA: Diagnosis not present

## 2022-07-19 DIAGNOSIS — F32A Depression, unspecified: Secondary | ICD-10-CM | POA: Diagnosis not present

## 2022-07-19 DIAGNOSIS — K2100 Gastro-esophageal reflux disease with esophagitis, without bleeding: Secondary | ICD-10-CM

## 2022-07-19 DIAGNOSIS — G2581 Restless legs syndrome: Secondary | ICD-10-CM | POA: Diagnosis not present

## 2022-07-19 MED ORDER — PANTOPRAZOLE SODIUM 40 MG PO TBEC: DELAYED_RELEASE_TABLET | ORAL | 0 refills | Status: DC

## 2022-07-19 MED ORDER — SODIUM CHLORIDE 0.9 % IV SOLN: 500.0000 mL | Freq: Once | INTRAVENOUS | Status: DC

## 2022-07-19 MED ORDER — VOQUEZNA 20 MG PO TABS: 20.0000 mg | ORAL_TABLET | Freq: Every day | ORAL | 1 refills | Status: AC

## 2022-07-19 NOTE — Progress Notes (Signed)
Vss nad trans to pacu 

## 2022-07-19 NOTE — Patient Instructions (Addendum)
   Increase Protonix to 80 mg twice daily   Voquenza 20 mg daily ordered -this will have to be approved -STOP PROTONIX IF WE CAN GET THIS   Carafate 3 times daily   Avoid all Nsaids  Restart Plavix today    YOU HAD AN ENDOSCOPIC PROCEDURE TODAY AT [facility]:   Refer to the procedure report that was given to you for any specific questions about what was found during the examination.  If the procedure report does not answer your questions, please call your gastroenterologist to clarify.  If you requested that your care partner not be given the details of your procedure findings, then the procedure report has been included in a sealed envelope for you to review at your convenience later.  YOU SHOULD EXPECT: Some feelings of bloating in the abdomen. Passage of more gas than usual.  Walking can help get rid of the air that was put into your GI tract during the procedure and reduce the bloating. If you had a lower endoscopy (such as a colonoscopy or flexible sigmoidoscopy) you may notice spotting of blood in your stool or on the toilet paper. If you underwent a bowel prep for your procedure, you may not have a normal bowel movement for a few days.  Please Note:  You might notice some irritation and congestion in your nose or some drainage.  This is from the oxygen used during your procedure.  There is no need for concern and it should clear up in a day or so.  SYMPTOMS TO REPORT IMMEDIATELY:    Following upper endoscopy (EGD)  Vomiting of blood or coffee ground material  New chest pain or pain under the shoulder blades  Painful or persistently difficult swallowing  New shortness of breath  Fever of 100F or higher  Black, tarry-looking stools  For urgent or emergent issues, a gastroenterologist can be reached at any hour by calling [phone]. Do not use MyChart messaging for urgent concerns.    DIET:  We do recommend a small meal at first, but then you may proceed  to your regular diet.  Drink plenty of fluids but you should avoid alcoholic beverages for 24 hours.  ACTIVITY:  You should plan to take it easy for the rest of today and you should NOT DRIVE or use heavy machinery until tomorrow (because of the sedation medicines used during the test).    FOLLOW UP: Our staff will call the number listed on your records the next business day following your procedure.  We will call around 7:15- 8:00 am to check on you and address any questions or concerns that you may have regarding the information given to you following your procedure. If we do not reach you, we will leave a message.     If any biopsies were taken you will be contacted by phone or by letter within the next 1-3 weeks.  Please call us at [phone] if you have not heard about the biopsies in 3 weeks.    SIGNATURES/CONFIDENTIALITY: You and/or your care partner have signed paperwork which will be entered into your electronic medical record.  These signatures attest to the fact that that the information above on your After Visit Summary has been reviewed and is understood.  Full responsibility of the confidentiality of this discharge information lies with you and/or your care-partner.

## 2022-07-19 NOTE — Progress Notes (Signed)
[facility] History and Physical   Primary Care Physician:  Dorothyann Peng, NP   Reason for Procedure:   Iron deficiency, dark stools  Plan:    EGD     HPI: Shelley Coleman is a 82 y.o. female  here for EGD to evaluate dark stools / iron deficiency. Seen note from 06/20/22 for details. She has seen cardiology and cleared for these procedures. On IV iron, her Hgb and iron studies have improved on recent labs. History of large hiatal hernia and duodenal ulcers on last EGD, here to see if she has mucosal healing of ulcers and assess for [facility] lesions, etc. She is ON PLAVIX for history of CVA (she states she stopped it 2 days ago). Has had intermittent dark stools since I have seen her but takes carafate / iron.  I have discussed risks / benefits of anesthesia and endoscopic procedure with Dennison Bulla and they wish to proceed with the exams as outlined today.    Past Medical History:  Diagnosis Date   Anemia    Has had iron infusions   Breast cancer ([place])    right   Cerebrovascular disease 11/12/2018   Depression    Depression    GERD (gastroesophageal reflux disease)    Hiatal hernia    History of GI bleed    Hx of mitral valve prolapse    Hyperlipidemia    Irregular heart beat    Neuropathy    numbness in both feet   Nocturnal leg cramps 09/02/2015   Optic neuritis    left   Osteopenia    Ovarian cyst    Parotid tumor    right   Pelvic fracture (HCC)    Restless legs syndrome    Shingles 2000   Left in V1   Stroke ([place])    Syncopal episodes 06/28/2022    Past Surgical History:  Procedure Laterality Date   ABDOMINAL HYSTERECTOMY     APPENDECTOMY     BREAST LUMPECTOMY Left    CATARACT EXTRACTION     ESOPHAGEAL MANOMETRY N/A 01/13/2013   Procedure: ESOPHAGEAL MANOMETRY (EM);  Surgeon: Sable Feil, MD;  Location: [facility];  Service: Endoscopy;  Laterality: N/A;   ESOPHAGOGASTRODUODENOSCOPY (EGD) WITH PROPOFOL N/A 10/30/2020   Procedure:  ESOPHAGOGASTRODUODENOSCOPY (EGD) WITH PROPOFOL;  Surgeon: Mauri Pole, MD;  Location: [facility];  Service: Endoscopy;  Laterality: N/A;   FOREIGN BODY REMOVAL  10/30/2020   Procedure: FOREIGN BODY REMOVAL;  Surgeon: Mauri Pole, MD;  Location: [facility];  Service: Endoscopy;;   HERNIA REPAIR  08/2009   HOT HEMOSTASIS N/A 10/30/2020   Procedure: HOT HEMOSTASIS (ARGON PLASMA COAGULATION/BICAP);  Surgeon: Mauri Pole, MD;  Location: [facility] ENDOSCOPY;  Service: Endoscopy;  Laterality: N/A;   MASTECTOMY Right    SPINE SURGERY     TOTAL HIP ARTHROPLASTY Left     Prior to Admission medications   Medication Sig Start Date End Date Taking? Authorizing Provider  acetaminophen (TYLENOL) 500 MG tablet Take 1,000 mg by mouth every 6 (six) hours as needed for moderate pain or headache.   Yes [provider]  ALPRAZolam (XANAX) 1 MG tablet TAKE 1/2 TO 1 (ONE-HALF TO ONE) TABLET BY MOUTH TWICE DAILY AS NEEDED FOR ANXIETY 07/07/22  Yes Nafziger, Tommi Rumps, NP  atenolol (TENORMIN) 50 MG tablet Take 1 tablet (50 mg total) by mouth daily. 05/11/21  Yes Nafziger, Tommi Rumps, NP  clopidogrel (PLAVIX) 75 MG tablet Take 1 tablet (75 mg total) by mouth  daily. 06/17/21  Yes Nafziger, Tommi Rumps, NP  ferrous sulfate 325 (65 FE) MG tablet Take by mouth.   Yes [provider]  gabapentin (NEURONTIN) 300 MG capsule Take 3 capsules (900 mg total) by mouth at bedtime. 07/17/22  Yes Marcial Pacas, MD  Multiple Vitamins-Minerals (CENTRUM SILVER 50+WOMEN) TABS Take 1 tablet by mouth daily.   Yes [provider]  Multiple Vitamins-Minerals (PRESERVISION AREDS 2 PO) Take 1 tablet by mouth in the morning and at bedtime.   Yes [provider]  pantoprazole (PROTONIX) 40 MG tablet TAKE 1 TABLET BY MOUTH TWICE DAILY.  PLEASE  KEEP  UPCOMING  FEBRUARY  APPOINTMENT  FOR  FURTHER  REFILLS. 07/07/22  Yes Sharlet Notaro, Carlota Raspberry, MD  Polyethyl Glycol-Propyl Glycol (SYSTANE OP) Place 1 drop into both eyes daily  as needed (dry eyes).   Yes [provider]  sucralfate (CARAFATE) 1 g tablet TAKE 1 TABLET BY MOUTH 4 TIMES DAILY PLEASE  TAKE  MEDICATION  2  HOURS  BEFORE  OR  AFTER  OTHER  MEDICATION 07/07/22  Yes Levin Erp, PA  bevacizumab (AVASTIN) 400 MG/16ML SOLN Inject 400 mg into the vein once. 16 ML 02/02/22   [provider]  nitroGLYCERIN (NITROSTAT) 0.4 MG SL tablet PLACE ONE TABLET UNDER THE TONGUE EVERY 5 MINUTES AS NEEDED. 02/07/22   Buford Dresser, MD  NONFORMULARY OR COMPOUNDED ITEM as needed. Formula 25: Doxepin 3% Amantadine 3% Dextromethorphan 2% Lidocaine 2%   Patient not taking: Reported on 07/19/2022    Ward Givens, NP    Current Outpatient Medications  Medication Sig Dispense Refill   acetaminophen (TYLENOL) 500 MG tablet Take 1,000 mg by mouth every 6 (six) hours as needed for moderate pain or headache.     ALPRAZolam (XANAX) 1 MG tablet TAKE 1/2 TO 1 (ONE-HALF TO ONE) TABLET BY MOUTH TWICE DAILY AS NEEDED FOR ANXIETY 30 tablet 0   atenolol (TENORMIN) 50 MG tablet Take 1 tablet (50 mg total) by mouth daily. 90 tablet 3   clopidogrel (PLAVIX) 75 MG tablet Take 1 tablet (75 mg total) by mouth daily. 90 tablet 3   ferrous sulfate 325 (65 FE) MG tablet Take by mouth.     gabapentin (NEURONTIN) 300 MG capsule Take 3 capsules (900 mg total) by mouth at bedtime. 270 capsule 3   Multiple Vitamins-Minerals (CENTRUM SILVER 50+WOMEN) TABS Take 1 tablet by mouth daily.     Multiple Vitamins-Minerals (PRESERVISION AREDS 2 PO) Take 1 tablet by mouth in the morning and at bedtime.     pantoprazole (PROTONIX) 40 MG tablet TAKE 1 TABLET BY MOUTH TWICE DAILY.  PLEASE  KEEP  UPCOMING  FEBRUARY  APPOINTMENT  FOR  FURTHER  REFILLS. 60 tablet 0   Polyethyl Glycol-Propyl Glycol (SYSTANE OP) Place 1 drop into both eyes daily as needed (dry eyes).     sucralfate (CARAFATE) 1 g tablet TAKE 1 TABLET BY MOUTH 4 TIMES DAILY PLEASE  TAKE  MEDICATION  2  HOURS  BEFORE  OR   AFTER  OTHER  MEDICATION 120 tablet 0   bevacizumab (AVASTIN) 400 MG/16ML SOLN Inject 400 mg into the vein once. 16 ML     nitroGLYCERIN (NITROSTAT) 0.4 MG SL tablet PLACE ONE TABLET UNDER THE TONGUE EVERY 5 MINUTES AS NEEDED. 25 tablet 12   NONFORMULARY OR COMPOUNDED ITEM as needed. Formula 25: Doxepin 3% Amantadine 3% Dextromethorphan 2% Lidocaine 2%   (Patient not taking: Reported on 07/19/2022)     Current Facility-Administered Medications  Medication Dose Route Frequency Provider Last Rate Last Admin   0.9 %  sodium chloride infusion  500 mL Intravenous Once Jenavi Beedle, Carlota Raspberry, MD        Allergies as of 07/19/2022 - Review Complete 07/19/2022  Allergen Reaction Noted   Diflunisal  03/07/2007   Lipitor [atorvastatin]  09/13/2016   Statins  03/13/2018   Tetanus toxoid  03/07/2007    Family History  Problem Relation Age of Onset   Cancer Mother        Angio sarcoma    Heart disease Father    Hypertension Father    Stroke Father        x2   Heart attack Father        x 11   Hernia Brother    Hypertension Brother    Neuropathy Neg Hx    Restless legs syndrome Neg Hx    Colon cancer Neg Hx    Esophageal cancer Neg Hx    Pancreatic cancer Neg Hx    Stomach cancer Neg Hx    Rectal cancer Neg Hx     Social History   Socioeconomic History   Marital status: Widowed    Spouse name: Not on file   Number of children: 2   Years of education: 56   Highest education level: Not on file  Occupational History   Occupation: Retired Therapist, sports  Tobacco Use   Smoking status: Never   Smokeless tobacco: Never  Vaping Use   Vaping Use: Never used  Substance and Sexual Activity   Alcohol use: Yes    Alcohol/week: 1.0 standard drink of alcohol    Types: 1 Glasses of wine per week    Comment: Rarely,socially   Drug use: No   Sexual activity: Not on file  Other Topics Concern   Not on file  Social History Narrative   Retired from nursing    Widowed    Left-handed      Social  Determinants of Health   Financial Resource Strain: Low Risk  (01/19/2022)   Overall Financial Resource Strain (CARDIA)    Difficulty of Paying Living Expenses: Not hard at all  Food Insecurity: No Food Insecurity (01/19/2022)   Hunger Vital Sign    Worried About Running Out of Food in the Last Year: Never true    [place] in the Last Year: Never true  Transportation Needs: No Transportation Needs (01/19/2022)   PRAPARE - Hydrologist (Medical): No    Lack of Transportation (Non-Medical): No  Physical Activity: Inactive (01/19/2022)   Exercise Vital Sign    Days of Exercise per Week: 0 days    Minutes of Exercise per Session: 0 min  Stress: No Stress Concern Present (01/19/2022)   [place]    Feeling of Stress : Not at all  Social Connections: Moderately Integrated (01/19/2022)   Social Connection and Isolation Panel [NHANES]    Frequency of Communication with Friends and Family: More than three times a week    Frequency of Social Gatherings with Friends and Family: More than three times a week    Attends Religious Services: More than 4 times per year    Active Member of [employer] or Organizations: Yes    Attends Archivist Meetings: More than 4 times per year    Marital Status: Widowed  Intimate Partner Violence: Not At Risk (01/19/2022)   Humiliation, Afraid, Rape, and Kick  questionnaire    Fear of Current or Ex-Partner: No    Emotionally Abused: No    Physically Abused: No    Sexually Abused: No    Review of Systems: All other review of systems negative except as mentioned in the HPI.  Physical Exam: Vital signs BP (!) 146/72   Pulse 76   Temp 98 F (36.7 C)   Ht 5\' 7"  (1.702 m)   Wt 182 lb (82.6 kg)   SpO2 96%   BMI 28.51 kg/m   General:   Alert,  Well-developed, pleasant and cooperative in NAD Lungs:  Clear throughout to auscultation.   Heart:  Regular rate  and rhythm Abdomen:  Soft, nontender and nondistended.   Neuro/Psych:  Alert and cooperative. Normal mood and affect. A and O x 3  Jolly Mango, MD [facility]

## 2022-07-19 NOTE — Progress Notes (Signed)
Called to room to assist during endoscopic procedure.  Patient ID and intended procedure confirmed with present staff. Received instructions for my participation in the procedure from the performing physician.  

## 2022-07-19 NOTE — Op Note (Signed)
[place] Patient Name: Shelley Coleman Procedure Date: 07/19/2022 7:33 AM MRN: [id number] Endoscopist: Remo Lipps P. Havery Moros , MD, [id number] Age: 82 Referring MD:  Date of Birth: 1940/10/18 Gender: Female Account #: [id number] Procedure:                Upper GI endoscopy Indications:              Iron deficiency anemia, dark stools on carafate /                            iron - unclear if truly melena or not, on IV iron -                            was treated with higher dose protonix for a period                            of time and then reduced back to 40mg  BID. On                            Plavix for history of CVA. Hgb is 12.1 on 3/13.                            Gastrin level has been normal Medicines:                Monitored Anesthesia Care Procedure:                Pre-Anesthesia Assessment:                           - Prior to the procedure, a History and Physical                            was performed, and patient medications and                            allergies were reviewed. The patient's tolerance of                            previous anesthesia was also reviewed. The risks                            and benefits of the procedure and the sedation                            options and risks were discussed with the patient.                            All questions were answered, and informed consent                            was obtained. Prior Anticoagulants: The patient has                            taken Plavix (clopidogrel), last dose  was 2 days                            prior to procedure. ASA Grade Assessment: II - A                            patient with mild systemic disease. After reviewing                            the risks and benefits, the patient was deemed in                            satisfactory condition to undergo the procedure.                           After obtaining informed consent, the endoscope was                             passed under direct vision. Throughout the                            procedure, the patient's blood pressure, pulse, and                            oxygen saturations were monitored continuously. The                            Olympus Scope [id number] was introduced through the                            mouth, and advanced to the second part of duodenum.                            The upper GI endoscopy was accomplished without                            difficulty. The patient tolerated the procedure                            well. Scope In: Scope Out: Findings:                 A 6 cm hiatal hernia was present.                           LA Grade A esophagitis was found at the GEJ                           The exam of the esophagus was otherwise normal.                           Localized erythematous mucosa was found in the                            gastric fundus  with old heme at the entrance of the                            hernia sac, possible Lysbeth Galas lesions / related to                            hernia. Biopsies were taken with a cold forceps for                            Helicobacter pylori testing.                           The exam of the stomach was otherwise normal.                           Multiple non-bleeding superficial duodenal ulcers                            with pigmented material were found in the duodenal                            bulb. The largest lesion was 6 mm in largest                            dimension (linear), the smallest was a few mm.                            Interval healing improvement NOT seen compared to                            the last exam.                           The exam of the duodenum was otherwise normal.                           Biopsies were taken with a cold forceps in the                            duodenal bulb for histology. Complications:            No immediate complications. Estimated blood loss:                             Minimal. Estimated Blood Loss:     Estimated blood loss was minimal. Impression:               - 6 cm hiatal hernia.                           - LA Grade A reflux esophagitis.                           - Erythematous mucosa in the gastric fundus  concerning related to large hiatal hernia. Biopsied                            to rule out H pylori                           - Normal stomach otherwise                           - Non-bleeding duodenal ulcers with pigmented                            material - interval healing has not occured despite                            higher dose PPI                           - Biopsies were taken with a cold forceps for                            histology in the duodenal bulb.                           Unfortunately she has persistence of duodenal                            ulcers despite PPI and carafate, and large hiatal                            hernia with some erosive changes - both of which                            could be related to her symptoms, I suspect ulcers                            more likely cause of IDA and possible bleeding                            symptoms in the setting of Plavix. Given history of                            CVA, as long as Hgb is stable on IV iron I would                            continue Plavix. Will discuss options as below -                            higher dose PPI for now and will try to get                            Voquezna if possible given failure of high dose PPI.  Would like to avoid hiatal hernia repair if                            possible given age and comorbidities, depends on                            how much these symptoms bother her and course over                            time with level of anemia, etc. Recommendation:           - Patient has a contact number available for                            emergencies. The signs  and symptoms of potential                            delayed complications were discussed with the                            patient. Return to normal activities tomorrow.                            Written discharge instructions were provided to the                            patient.                           - Resume previous diet.                           - Continue present medications.                           - Increase protonix to 80mg  twice daily for now                           - Will try to see if we can get Voquezna approved -                            20mg  / day - stop protonix if we can get this                           - Carafate three times daily                           - Can resume Plavix if no active bleeding and Hgb                            stable (history of CVA)                           - Avoid all NSAIDS                           -  Await pathology results and I will discuss                            options with the patient Carlota Raspberry. Havery Moros, MD 07/19/2022 7:56:02 AM This report has been signed electronically.

## 2022-07-19 NOTE — Telephone Encounter (Signed)
PA request received via CMM for Voquezna 20MG  tablets  PA submitted to [employer] Part D and is pending determination.  Key: [id number]

## 2022-07-20 ENCOUNTER — Ambulatory Visit (INDEPENDENT_AMBULATORY_CARE_PROVIDER_SITE_OTHER): Payer: Medicare Other

## 2022-07-20 ENCOUNTER — Telehealth: Payer: Self-pay

## 2022-07-20 DIAGNOSIS — R011 Cardiac murmur, unspecified: Secondary | ICD-10-CM | POA: Diagnosis not present

## 2022-07-20 LAB — ECHOCARDIOGRAM COMPLETE
Area-P 1/2: 2.91 cm2
MV M vel: 1.93 m/s
MV Peak grad: 14.9 mmHg

## 2022-07-20 NOTE — Telephone Encounter (Signed)
Follow up call placed, no answer. 

## 2022-07-20 NOTE — Progress Notes (Signed)
Patient complained of feeling weak and tired during echo appt. Offered to take patient to the emergency room. Patient declined emergency room. Patient has multiple medical issues including unknown internal bleeding that could be contributing to weakness. I walked patient to her car without any incidence.

## 2022-07-21 ENCOUNTER — Telehealth: Payer: Self-pay | Admitting: *Deleted

## 2022-07-21 ENCOUNTER — Telehealth: Payer: Self-pay

## 2022-07-21 NOTE — Telephone Encounter (Signed)
Spoke with Dr. Lyndel Safe as Dr. Havery Moros is off today. He suggested the GI cocktail for the patient to take over the weekend, Called the patient back and relayed the information to her. She is not having pain unless she swallows, when she does swallow she rates her pain4/5. Instructed her to use this as directed but to let us know if she has any fever, bleeding or pain behind her shoulder blades. She verbalized understanding.

## 2022-07-21 NOTE — Telephone Encounter (Signed)
Thanks Levada Dy. I am off today but just seeing this. No high risk interventions performed during her exam, looks like Judson Roch spoke with her later this afternoon. GI cocktail was recommended. Agree that if she feels worse over the weekend she should call the on-call MD. Thanks

## 2022-07-21 NOTE — Telephone Encounter (Signed)
Verbal orders received from Dr. Lyndel Safe to send prescription in for GI cocktail. Prescription was called into [facility]. Pt made aware.  Phone number and Address provided. Pt verbalized understanding with all questions answered.

## 2022-07-21 NOTE — Telephone Encounter (Signed)
Inbound call from patient stating she is experiencing sore please provide a f/u call. Thank you

## 2022-07-21 NOTE — Telephone Encounter (Signed)
Thanks Sarah. Just seeing this now as I am off today. Agree with Dr. Steve Rattler recommendations. No high risk interventions done during endoscopy, I think risk for anything bad related to her procedure is quite low. However agree if she has fever, worsening symptoms, etc, over the weekend she needs to seek evaluation or call after hours line.

## 2022-07-21 NOTE — Telephone Encounter (Signed)
Called the patient back. She is complaining of a sore throat when she swallows. "Feels like my saliva gets stuck." She did increase her Protonix to 80 mg as recommended and started the Carafate. Encouraged her to continue these as instructed per Dr. Havery Moros. Will notify him and see if he has any further advise.

## 2022-07-21 NOTE — Telephone Encounter (Signed)
Returned patient call.  She wanted [place] to be aware she's having more discomfort than she felt she had after previous egd's in the past.  Encouraged drinking warm liquids, taking tylenol/ibuprofen, sucking on numbing throat lozenges.  Encouraged patient to call on call number over the weekend if her throat persists or worsens.

## 2022-07-24 NOTE — Telephone Encounter (Signed)
Results called to patient who verbalized understanding of results as provided. Georgana Curio MHA RN CCM

## 2022-07-24 NOTE — Telephone Encounter (Signed)
PA has now been APPROVED from 07/19/22-05/01/2023

## 2022-07-31 ENCOUNTER — Other Ambulatory Visit: Payer: Self-pay

## 2022-07-31 DIAGNOSIS — K269 Duodenal ulcer, unspecified as acute or chronic, without hemorrhage or perforation: Secondary | ICD-10-CM

## 2022-07-31 DIAGNOSIS — D509 Iron deficiency anemia, unspecified: Secondary | ICD-10-CM

## 2022-08-14 DIAGNOSIS — R768 Other specified abnormal immunological findings in serum: Secondary | ICD-10-CM | POA: Diagnosis not present

## 2022-08-14 DIAGNOSIS — E663 Overweight: Secondary | ICD-10-CM | POA: Diagnosis not present

## 2022-08-14 DIAGNOSIS — Z6828 Body mass index (BMI) 28.0-28.9, adult: Secondary | ICD-10-CM | POA: Diagnosis not present

## 2022-08-14 DIAGNOSIS — R5383 Other fatigue: Secondary | ICD-10-CM | POA: Diagnosis not present

## 2022-08-14 DIAGNOSIS — D508 Other iron deficiency anemias: Secondary | ICD-10-CM | POA: Diagnosis not present

## 2022-08-23 ENCOUNTER — Ambulatory Visit (INDEPENDENT_AMBULATORY_CARE_PROVIDER_SITE_OTHER): Payer: Medicare Other | Admitting: Neurology

## 2022-08-23 ENCOUNTER — Encounter: Payer: Self-pay | Admitting: Neurology

## 2022-08-23 VITALS — BP 144/80 | HR 62 | Ht 67.0 in | Wt 185.2 lb

## 2022-08-23 DIAGNOSIS — G4700 Insomnia, unspecified: Secondary | ICD-10-CM | POA: Diagnosis not present

## 2022-08-23 DIAGNOSIS — R351 Nocturia: Secondary | ICD-10-CM | POA: Diagnosis not present

## 2022-08-23 DIAGNOSIS — G2581 Restless legs syndrome: Secondary | ICD-10-CM

## 2022-08-23 DIAGNOSIS — G4719 Other hypersomnia: Secondary | ICD-10-CM | POA: Diagnosis not present

## 2022-08-23 DIAGNOSIS — G478 Other sleep disorders: Secondary | ICD-10-CM | POA: Diagnosis not present

## 2022-08-23 DIAGNOSIS — Z9189 Other specified personal risk factors, not elsewhere classified: Secondary | ICD-10-CM | POA: Diagnosis not present

## 2022-08-23 DIAGNOSIS — R5383 Other fatigue: Secondary | ICD-10-CM

## 2022-08-23 NOTE — Progress Notes (Signed)
Subjective:    Patient ID: Shelley Coleman is a 82 y.o. female.  HPI    Huston Foley, MD, PhD [facility] [address], [address] [address] [name], [place] [address]  Dear Shelley Coleman,  I saw your patient, Shelley Coleman, upon your kind request in my sleep clinic today for initial consultation of her sleep disorder, in particular, concern for underlying obstructive sleep apnea.  The patient is unaccompanied today.  As you know, Shelley Coleman is an 82 year old female with an underlying medical history of anemia, iron deficiency, restless leg syndrome, lacunar stroke, history of macular degeneration, hyperlipidemia, history of breast cancer, status post surgery and chemoradiation, history of GI bleed, history of optic neuritis, status post lumbar decompression surgery, and mildly overweight state, who reports chronic difficulty sleeping, including difficulty falling asleep and staying asleep.  She is not sure if she snores.  Her Epworth sleepiness score is 8 out of 24, fatigue severity score is 42 out of 63.  I reviewed your office note from 07/17/2022.  She is widowed, she lives alone.  She has a small dog that sleeps on the bed with her.  She does not have a set schedule for her sleep, she may go to bed somewhere between midnight and 3 AM.  She tries to sleep as much as possible, averages about 4 to 5 hours on any given night with interruptions.  She has nocturia at least once per average night, denies morning headaches but has had the occasional nocturnal headache for which she has taken Tylenol.  She is followed by hematology and GI for her GI bleed, she was found to have duodenal ulcers and gastritis as well as esophagitis.  She has a hiatal hernia.  She has 1 grown son and 1 grown daughter.  She has restless leg symptoms and her gabapentin was increased to 900 mg at bedtime.  She is a side sleeper.  She has had iron infusions and even blood transfusion.  She has seen GI at [facility].  She has  had fatigue for about a year.  She is a non-smoker and drinks alcohol less than once a week.  She drinks caffeine in the form of coffee, about 2 cups in the mornings.  She does not have a TV in her bedroom.  Her Past Medical History Is Significant For: Past Medical History:  Diagnosis Date   Anemia    Has had iron infusions   Breast cancer    right   Cerebrovascular disease 11/12/2018   Depression    Depression    GERD (gastroesophageal reflux disease)    Hiatal hernia    History of GI bleed    Hx of mitral valve prolapse    Hyperlipidemia    Irregular heart beat    Neuropathy    numbness in both feet   Nocturnal leg cramps 09/02/2015   Optic neuritis    left   Osteopenia    Ovarian cyst    Parotid tumor    right   Pelvic fracture    Restless legs syndrome    Shingles 2000   Left in V1   Stroke    Syncopal episodes 06/28/2022    Her Past Surgical History Is Significant For: Past Surgical History:  Procedure Laterality Date   ABDOMINAL HYSTERECTOMY     APPENDECTOMY     BREAST LUMPECTOMY Left    CATARACT EXTRACTION     ESOPHAGEAL MANOMETRY N/A 01/13/2013   Procedure: ESOPHAGEAL MANOMETRY (EM);  Surgeon: Mardella Layman, MD;  Location: [facility];  Service: Endoscopy;  Laterality: N/A;   ESOPHAGOGASTRODUODENOSCOPY (EGD) WITH PROPOFOL N/A 10/30/2020   Procedure: ESOPHAGOGASTRODUODENOSCOPY (EGD) WITH PROPOFOL;  Surgeon: Napoleon Form, MD;  Location: [facility];  Service: Endoscopy;  Laterality: N/A;   FOREIGN BODY REMOVAL  10/30/2020   Procedure: FOREIGN BODY REMOVAL;  Surgeon: Napoleon Form, MD;  Location: [facility];  Service: Endoscopy;;   HERNIA REPAIR  08/2009   HOT HEMOSTASIS N/A 10/30/2020   Procedure: HOT HEMOSTASIS (ARGON PLASMA COAGULATION/BICAP);  Surgeon: Napoleon Form, MD;  Location[facility] END[facility]COPY;  Service: Endoscopy;  Laterality: N/A;   MASTECTOMY Right    SPINE SURGERY     TOTAL HIP ARTHROPLASTY Left     Her Family History Is  Significant For: Family History  Problem Relation Age of Onset   Cancer Mother        Angio sarcoma    Heart disease Father    Hypertension Father    Stroke Father        x2   Heart attack Father        x 11   Hernia Brother    Hypertension Brother    Neuropathy Neg Hx    Restless legs syndrome Neg Hx    Colon cancer Neg Hx    Esophageal cancer Neg Hx    Pancreatic cancer Neg Hx    Stomach cancer Neg Hx    Rectal cancer Neg Hx    Sleep apnea Neg Hx     Her Social History Is Significant For: Social History   Socioeconomic History   Marital status: Widowed    Spouse name: Not on file   Number of children: 2   Years of education: 76   Highest education level: Not on file  Occupational History   Occupation: Retired Charity fundraiser  Tobacco Use   Smoking status: Never   Smokeless tobacco: Never  Vaping Use   Vaping Use: Never used  Substance and Sexual Activity   Alcohol use: Yes    Alcohol/week: 1.0 standard drink of alcohol    Types: 1 Glasses of wine per week    Comment: Rarely,socially   Drug use: No   Sexual activity: Not on file  Other Topics Concern   Not on file  Social History Narrative   Retired from nursing    Widowed    Left-handed      Social Determinants of Health   Financial Resource Strain: Low Risk  (01/19/2022)   Overall Financial Resource Strain (CARDIA)    Difficulty of Paying Living Expenses: Not hard at all  Food Insecurity: No Food Insecurity (01/19/2022)   Hunger Vital Sign    Worried About Running Out of Food in the Last Year: Never true    Ran Out of Food in the Last Year: Never true  Transportation Needs: No Transportation Needs (01/19/2022)   PRAPARE - Administrator, Civil Service (Medical): No    Lack of Transportation (Non-Medical): No  Physical Activity: Inactive (01/19/2022)   Exercise Vital Sign    Days of Exercise per Week: 0 days    Minutes of Exercise per Session: 0 min  Stress: No Stress Concern Present (01/19/2022)    [employer] of Occupational Health - Occupational Stress Questionnaire    Feeling of Stress : Not at all  Social Connections: Moderately Integrated (01/19/2022)   Social Connection and Isolation Panel [NHANES]    Frequency of Communication with Friends and Family: More  than three times a week    Frequency of Social Gatherings with Friends and Family: More than three times a week    Attends Religious Services: More than 4 times per year    Active Member of Clubs or Organizations: Yes    Attends Banker Meetings: More than 4 times per year    Marital Status: Widowed    Her Allergies Are:  Allergies  Allergen Reactions   Diflunisal     REACTION: rash , swelling  anaphlaxsis   Lipitor [Atorvastatin]     Pt had body joint pain   Statins     Myalgia     Tetanus Toxoid     REACTION: arm swelling, fever  :   Her Current Medications Are:  Outpatient Encounter Medications as of 08/23/2022  Medication Sig   acetaminophen (TYLENOL) 500 MG tablet Take 1,000 mg by mouth every 6 (six) hours as needed for moderate pain or headache.   ALPRAZolam (XANAX) 1 MG tablet TAKE 1/2 TO 1 (ONE-HALF TO ONE) TABLET BY MOUTH TWICE DAILY AS NEEDED FOR ANXIETY   atenolol (TENORMIN) 50 MG tablet Take 1 tablet (50 mg total) by mouth daily.   bevacizumab (AVASTIN) 400 MG/16ML SOLN Inject 400 mg into the vein once. 16 ML   clopidogrel (PLAVIX) 75 MG tablet Take 1 tablet (75 mg total) by mouth daily.   ferrous sulfate 325 (65 FE) MG tablet Take by mouth.   gabapentin (NEURONTIN) 300 MG capsule Take 3 capsules (900 mg total) by mouth at bedtime.   Multiple Vitamins-Minerals (CENTRUM SILVER 50+WOMEN) TABS Take 1 tablet by mouth daily.   Multiple Vitamins-Minerals (PRESERVISION AREDS 2 PO) Take 1 tablet by mouth in the morning and at bedtime.   nitroGLYCERIN (NITROSTAT) 0.4 MG SL tablet PLACE ONE TABLET UNDER THE TONGUE EVERY 5 MINUTES AS NEEDED.   NONFORMULARY OR COMPOUNDED ITEM as needed. Formula  25: Doxepin 3% Amantadine 3% Dextromethorphan 2% Lidocaine 2%     pantoprazole (PROTONIX) 40 MG tablet Protonix 80 mg twice daily for now   Polyethyl Glycol-Propyl Glycol (SYSTANE OP) Place 1 drop into both eyes daily as needed (dry eyes).   sucralfate (CARAFATE) 1 g tablet TAKE 1 TABLET BY MOUTH 4 TIMES DAILY PLEASE  TAKE  MEDICATION  2  HOURS  BEFORE  OR  AFTER  OTHER  MEDICATION   Vonoprazan Fumarate (VOQUEZNA) 20 MG TABS Take 20 mg by mouth daily.   No facility-administered encounter medications on file as of 08/23/2022.  :   Review of Systems:  Out of a complete 14 point review of systems, all are reviewed and negative with the exception of these symptoms as listed below:   Review of Systems  Neurological:        Pt here for sleep consult Pt fatigue,some headaches, controlled hypertension Pt denies snore,sleep study,CPAP machine   ESS:8 FSS:42     Objective:  Neurological Exam  Physical Exam Physical Examination:   Vitals:   08/23/22 1431  BP: (!) 144/80  Pulse: 62    General Examination: The patient is a very pleasant 82 y.o. female in no acute distress. She appears well-developed and well-nourished and well groomed.   HEENT: Normocephalic, atraumatic, pupils are equal, round and reactive to light, extraocular tracking is good without limitation to gaze excursion or nystagmus noted. Hearing is grossly intact. Face is symmetric with normal facial animation. Speech is clear with no dysarthria noted. There is no hypophonia. There is no lip, neck/head, jaw or voice  tremor. Neck is supple with full range of passive and active motion. There are no carotid bruits on auscultation. Oropharynx exam reveals: mild mouth dryness, adequate dental hygiene and mild airway crowding, due to small airway entry and redundant soft palate, tonsils about 1+ bilaterally.  Mallampati class II.  Neck circumference 16-3/8 inches.  Tongue protrudes centrally and palate elevates  symmetrically.  Chest: Clear to auscultation without wheezing, rhonchi or crackles noted.  Heart: S1+S2+0, regular with prominent 4/6 systolic murmur noted (not new per patient).     Abdomen: Soft, non-tender and non-distended.  Extremities: There is no pitting edema in the distal lower extremities bilaterally.   Skin: Warm and dry without trophic changes noted.   Musculoskeletal: exam reveals no obvious joint deformities.   Neurologically:  Mental status: The patient is awake, alert and oriented in all 4 spheres. Her immediate and remote memory, attention, language skills and fund of knowledge are appropriate. There is no evidence of aphasia, agnosia, apraxia or anomia. Speech is clear with normal prosody and enunciation. Thought process is linear. Mood is normal and affect is normal.  Cranial nerves II - XII are as described above under HEENT exam.  Motor exam: Normal bulk, strength and tone is noted. There is no obvious action or resting tremor.  Fine motor skills and coordination: grossly intact.  Cerebellar testing: No dysmetria or intention tremor. There is no truncal or gait ataxia.  Sensory exam: intact to light touch in the upper and lower extremities.  Gait, station and balance: She stands easily. No veering to one side is noted. No leaning to one side is noted. Posture is age-appropriate and stance is narrow based. Gait shows normal stride length and normal pace. No problems turning are noted.   Assessment and Plan:  In summary, MAHIRA PETRAS is a very pleasant 82 y.o.-year old female with an underlying medical history of anemia, iron deficiency, restless leg syndrome, lacunar stroke, history of macular degeneration, hyperlipidemia, history of breast cancer, status post surgery and chemoradiation, history of GI bleed, history of optic neuritis, status post lumbar decompression surgery, and mildly overweight state, who presents for evaluation of her chronic sleep disturbance.  Due  to her difficulty sleeping at home and erratic sleep schedule, we mutually agreed to proceed with a home sleep test.  I talked to the patient about sleep apnea and treatment options.  She reports that her brother has sleep apnea.  She is advised that if she has obstructive sleep apnea, I would likely recommend AutoPap therapy for her.  She would be willing to try it.  She is advised to continue with her current medication regimen.  She is advised that we will call her to schedule her home sleep test once we have insurance approval and we will call her with her results and pick up our discussion about treatment options and the next steps for her. We talked about the importance of maintaining a schedule and healthy lifestyle as well as good sleep hygiene.  I answered all her questions today and she was in agreement with our plan. Thank you very much for allowing me to participate in the care of this nice patient. If I can be of any further assistance to you please do not hesitate to call me at [phone].  Sincerely,   Huston Foley, MD, PhD

## 2022-08-23 NOTE — Patient Instructions (Signed)

## 2022-08-28 ENCOUNTER — Other Ambulatory Visit: Payer: Self-pay | Admitting: Adult Health

## 2022-08-28 DIAGNOSIS — I10 Essential (primary) hypertension: Secondary | ICD-10-CM

## 2022-09-13 ENCOUNTER — Ambulatory Visit (INDEPENDENT_AMBULATORY_CARE_PROVIDER_SITE_OTHER): Payer: Medicare Other | Admitting: Neurology

## 2022-09-13 ENCOUNTER — Telehealth: Payer: Self-pay | Admitting: Neurology

## 2022-09-13 VITALS — BP 148/85 | HR 64 | Ht 67.0 in | Wt 185.0 lb

## 2022-09-13 DIAGNOSIS — D500 Iron deficiency anemia secondary to blood loss (chronic): Secondary | ICD-10-CM | POA: Diagnosis not present

## 2022-09-13 DIAGNOSIS — R5383 Other fatigue: Secondary | ICD-10-CM | POA: Diagnosis not present

## 2022-09-13 DIAGNOSIS — R269 Unspecified abnormalities of gait and mobility: Secondary | ICD-10-CM

## 2022-09-13 DIAGNOSIS — H532 Diplopia: Secondary | ICD-10-CM | POA: Diagnosis not present

## 2022-09-13 DIAGNOSIS — R202 Paresthesia of skin: Secondary | ICD-10-CM | POA: Diagnosis not present

## 2022-09-13 NOTE — Telephone Encounter (Signed)
medicare/[employer] NPR sent to GI [phone] 

## 2022-09-13 NOTE — Progress Notes (Signed)
Chief Complaint  Patient presents with   Procedure    Rm EMG/NCV 4.      ASSESSMENT AND PLAN  Shelley Coleman is a 82 y.o. female    Restless leg symptoms Excessive fatigue, lack of stamina  History of GI bleeding in July 2022, require blood transfusion, recent occult blood positive x 3, with dropping of hemoglobin, required blood transfusion again January 2024,, continue intermittent tarry stool,  Chronic iron deficiency anemia due to chronic GI loss, could certainly worsening her restless leg symptoms, and her complaints of excessive fatigue, lack of stamina,  Higher dose of gabapentin up to 300 mg 3 tablets has been helpful  Sleep study pending Worsening gait abnormality   Mild residual right distal leg weakness from previous right lumbar radiculopathy, hyperreflexia of bilateral upper extremity, left patella with bilateral Babinski signs,  MRI of cervical spine to rule out spondylitic cervical myelopathy  Return To Clinic With NP In 6 Months   ,  DIAGNOSTIC DATA (LABS, IMAGING, TESTING) - I reviewed patient records, labs, notes, testing and imaging myself where available.  This MRI of the brain without contrast in Dec 2018. 1.    Chronic lacunar infarction involving the head of the left caudate and adjacent internal capsule, unchanged in appearance compared to the 11/01/2015 MRI. 2.    Chronic ischemic changes in the hemispheres, pons and thalamus, essentially unchanged when compared to the previous MRI.  3.    There are no acute findings and there are no changes in the brain compared to the MRI dated 11/01/2015    MEDICAL HISTORY:  Shelley Coleman is a 82 year old female, previous patient of Dr. Anne Hahn, primary care is nurse practitioner Evelene Croon, Kandee Keen,    I reviewed and summarized the referring note. PMHX Chronic insomnia Macular degeneration HLD Right Breast Cancer, s/p lobectomy more than 20 years ago, chemo/radiation therapy GI bleeding,  Left optic neuritis at  age 5s. Stroke  Lumbar decompression surgery in past.  She was seen by Dr. Anne Hahn before 2014, carried a diagnosis of restless leg syndrome, chronic severe insomnia, low ferritin level, previously has required IV iron dextran on, which does help her symptoms  Today, her main concern is overwhelming fatigue, lack of stamina, this has been ongoing since 2020, seen by different specialist, including GI, cardiologist  EGD on March 17, 2022 by GI physician Dr. Ileene Patrick, 6 cm hiatal hernia, grade B esophagitis, stomach exam was normal, nonbleeding superficial small duodenal ulcer erosion, diverticulum was found in the second portion of duodenum,   Cardiology work up in Sept 2023, chart reviewed, coronary artery disease, with borderline significant stenosis in the [facility], her fatigue with exertion may be stable angina, suggest sublingual nitroglycerin as needed, hypertension that was well-controlled, history of CVA, hyperlipidemia, could not tolerate statin, myalgia, whole family have similar issue, received Leqvi infusion February 16, 2022,  She also reported history of left optic neuritis, many years ago, recovering well, COVID the diagnosis of macular degeneration now, receiving avastin injection periodically  She complains of overwhelming generalized fatigue, has not been able to bring back her trash can, it is too much for her to go to her driveway, and bring it back, she lives alone, still driving, but her children check on her frequently  She complains of many years history of intermittent double vision,  Laboratory evaluations 2023, hemoglobin of 11.7, LDL 67, gastrin level was 45 within normal limit, ferritin level was only 6.3, TIBC was elevated at 455  UPDATE July 17 2022: Continue complains of excessive fatigue, not feeling well, burning, bloating sensation, uncomfortable from throat to pelvic region, black stool, stool occult blood was positive x 3, pending GI EGD evaluation,  taking Plavix with reported history of stroke, Personally reviewed MRI of the brain from December 2018, chronic lacunar infarction in the head of left caudate, and internal capsule, moderate small vessel disease involving pons, thalamus, subcortical white matter   She was seen by [facility] on July 12 2022, with diagnosis of iron deficiency anemia, B12 was also low,  She had infusion early end of January 2024, not improvement of her stamina, reported 1 episode of syncope few weeks ago, stepping out of the car, taking her little dog for groomer, then passed out besides her car,  I reviewed hospital discharge in July 2022, she presented to the hospital complains of shortness of breath, weakness, found to have upper GI bleeding, with acute blood loss anemia, Hg 7.8,  incidental COVID-19 infection, required blood transfusion, EGD on July 2 showed Cameron lesion, AVMs-s/p APC, GI recommended continue PPI for 3 months, orthostatic dizziness within due to orthostatic hypotension, was managed with IV fluid, which has helped her symptoms,  Laboratory evaluation on July 12, 2022 from [facility], hemoglobin of 12.1, ferritin was 125, folic acid 21.2, B12 293, stool test was positive for occult bloodx3,   Extensive laboratory evaluation December 2023, hemoglobin of 11.7, normal TSH, B12 was within normal limit, 614, TSH, C-reactive protein, CPK, ESR, ANA, protein electrophoresis, A1c, autoimmune neurology laboratory evaluation, ER, acetylcholine receptor antibody, ANCA profile showed positive anti-PR-3 antibody,  Blood count dropped to 10.5 in January, ferritin was only 6, B12 dropped to 358, blood transfusion on May 19, 2022,  Her restless leg symptoms is under much better control with higher dose of gabapentin, tolerating it well, but sometimes to take as needed Xanax for her to sleep, complains of poor sleep quality, often wake up in the morning unrefreshed, she lives alone, was not sure if she  is snoring, complains of excessive daytime fatigue, sleepiness too  UPDATE May 15th 2024: She return for electrodiagnostic study today, showed no evidence of large fiber peripheral neuropathy, evidence of chronic right lumbosacral radiculopathy  She continue have intermittent tarry stool, last follow-up with GI physician was in February,  Her restless leg symptoms overall has much improved with higher dose of gabapentin 900 mg daily, was seen by Dr. Frances Furbish on schedule for sleep study  She complains of worsening gait abnormality, occasionally bowel and bladder incontinence, stiff gait, today's examination showed brisk bilateral upper extremity, left lower extremity reflexes bilateral Babinski signs, concern for cervical spondylitic myelopathy, history of right lumbar radiculopathy required decompression surgery in the past  Personally reviewed MRI of lumbar spine January 2023, posterior decompression fusion L4-5 with metal hardware, multilevel degenerative disease    PHYSICAL EXAM:   Vitals:   09/13/22 0800  BP: (!) 148/85  Pulse: 64  Weight: 185 lb (83.9 kg)  Height: 5\' 7"  (1.702 m)   Body mass index is 28.98 kg/m.  PHYSICAL EXAMNIATION:  Gen: NAD, conversant, well nourised, well groomed                     Cardiovascular: Regular rate rhythm, no peripheral edema, warm, nontender. Eyes: Conjunctivae clear without exudates or hemorrhage Neck: Supple, no carotid bruits. Pulmonary: Clear to auscultation bilaterally   NEUROLOGICAL EXAM:  MENTAL STATUS: Speech/cognition: Awake, alert, oriented to history taking and casual conversation CRANIAL  NERVES: CN II: Visual fields are full to confrontation. Pupils are round equal and briskly reactive to light. CN III, IV, VI: extraocular movement are normal. No ptosis. CN V: Facial sensation is intact to light touch CN VII: Face is symmetric with normal eye closure  CN VIII: Hearing is normal to causal conversation. CN IX, X: Phonation  is normal. CN XI: Head turning and shoulder shrug are intact CN XII: Narrow oropharyngeal space  MOTOR: Mild bilateral shoulder abduction, external rotation weakness, mild right ankle plantarflexion, inversion, slight ankle dorsiflexion weakness  REFLEXES: Reflexes are 2 and symmetric at the biceps, triceps, knees (1/2), and ankles. Plantar responses are extensor bilaterally  SENSORY: Length-dependent decreased vibratory sensation to mid shin level, decreased to pinprick worsening on the right side,  COORDINATION: There is no trunk or limb dysmetria noted.  GAIT/STANCE: Able to get up from seated position with effort, cautious, stiff  REVIEW OF SYSTEMS:  Full 14 system review of systems performed and notable only for as above All other review of systems were negative.   ALLERGIES: Allergies  Allergen Reactions   Diflunisal     REACTION: rash , swelling  anaphlaxsis   Lipitor [Atorvastatin]     Pt had body joint pain   Statins     Myalgia     Tetanus Toxoid     REACTION: arm swelling, fever    HOME MEDICATIONS: Current Outpatient Medications  Medication Sig Dispense Refill   acetaminophen (TYLENOL) 500 MG tablet Take 1,000 mg by mouth every 6 (six) hours as needed for moderate pain or headache.     ALPRAZolam (XANAX) 1 MG tablet TAKE 1/2 TO 1 (ONE-HALF TO ONE) TABLET BY MOUTH TWICE DAILY AS NEEDED FOR ANXIETY 30 tablet 0   atenolol (TENORMIN) 50 MG tablet Take 1 tablet by mouth once daily 90 tablet 0   bevacizumab (AVASTIN) 400 MG/16ML SOLN Inject 400 mg into the vein once. 16 ML     clopidogrel (PLAVIX) 75 MG tablet Take 1 tablet by mouth once daily 90 tablet 0   ferrous sulfate 325 (65 FE) MG tablet Take by mouth.     gabapentin (NEURONTIN) 300 MG capsule Take 3 capsules (900 mg total) by mouth at bedtime. 270 capsule 3   Multiple Vitamins-Minerals (CENTRUM SILVER 50+WOMEN) TABS Take 1 tablet by mouth daily.     Multiple Vitamins-Minerals (PRESERVISION AREDS 2 PO) Take  1 tablet by mouth in the morning and at bedtime.     nitroGLYCERIN (NITROSTAT) 0.4 MG SL tablet PLACE ONE TABLET UNDER THE TONGUE EVERY 5 MINUTES AS NEEDED. 25 tablet 12   NONFORMULARY OR COMPOUNDED ITEM as needed. Formula 25: Doxepin 3% Amantadine 3% Dextromethorphan 2% Lidocaine 2%       pantoprazole (PROTONIX) 40 MG tablet Protonix 80 mg twice daily for now 60 tablet 0   Polyethyl Glycol-Propyl Glycol (SYSTANE OP) Place 1 drop into both eyes daily as needed (dry eyes).     sucralfate (CARAFATE) 1 g tablet TAKE 1 TABLET BY MOUTH 4 TIMES DAILY PLEASE  TAKE  MEDICATION  2  HOURS  BEFORE  OR  AFTER  OTHER  MEDICATION 120 tablet 0   Vonoprazan Fumarate (VOQUEZNA) 20 MG TABS Take 20 mg by mouth daily. 30 tablet 1   No current facility-administered medications for this visit.    PAST MEDICAL HISTORY: Past Medical History:  Diagnosis Date   Anemia    Has had iron infusions   Breast cancer (HCC)    right  Cerebrovascular disease 11/12/2018   Depression    Depression    GERD (gastroesophageal reflux disease)    Hiatal hernia    History of GI bleed    Hx of mitral valve prolapse    Hyperlipidemia    Irregular heart beat    Neuropathy    numbness in both feet   Nocturnal leg cramps 09/02/2015   Optic neuritis    left   Osteopenia    Ovarian cyst    Parotid tumor    right   Pelvic fracture (HCC)    Restless legs syndrome    Shingles 2000   Left in V1   Stroke (HCC)    Syncopal episodes 06/28/2022    PAST SURGICAL HISTORY: Past Surgical History:  Procedure Laterality Date   ABDOMINAL HYSTERECTOMY     APPENDECTOMY     BREAST LUMPECTOMY Left    CATARACT EXTRACTION     ESOPHAGEAL MANOMETRY N/A 01/13/2013   Procedure: ESOPHAGEAL MANOMETRY (EM);  Surgeon: Mardella Layman, MD;  Location: [facility];  Service: Endoscopy;  Laterality: N/A;   ESOPHAGOGASTRODUODENOSCOPY (EGD) WITH PROPOFOL N/A 10/30/2020   Procedure: ESOPHAGOGASTRODUODENOSCOPY (EGD) WITH PROPOFOL;  Surgeon:  Napoleon Form, MD;  Location: [facility] ENDOSCOPY;  Service: Endoscopy;  Laterality: N/A;   FOREIGN BODY REMOVAL  10/30/2020   Procedure: FOREIGN BODY REMOVAL;  Surgeon: Napoleon Form, MD;  Location: [facility] ENDOSCOPY;  Service: Endoscopy;;   HERNIA REPAIR  08/2009   HOT HEMOSTASIS N/A 10/30/2020   Procedure: HOT HEMOSTASIS (ARGON PLASMA COAGULATION/BICAP);  Surgeon: Napoleon Form, MD;  Location: [facility] ENDOSCOPY;  Service: Endoscopy;  Laterality: N/A;   MASTECTOMY Right    SPINE SURGERY     TOTAL HIP ARTHROPLASTY Left     FAMILY HISTORY: Family History  Problem Relation Age of Onset   Cancer Mother        Angio sarcoma    Heart disease Father    Hypertension Father    Stroke Father        x2   Heart attack Father        x 11   Hernia Brother    Hypertension Brother    Neuropathy Neg Hx    Restless legs syndrome Neg Hx    Colon cancer Neg Hx    Esophageal cancer Neg Hx    Pancreatic cancer Neg Hx    Stomach cancer Neg Hx    Rectal cancer Neg Hx    Sleep apnea Neg Hx     SOCIAL HISTORY: Social History   Socioeconomic History   Marital status: Widowed    Spouse name: Not on file   Number of children: 2   Years of education: 66   Highest education level: Not on file  Occupational History   Occupation: Retired Charity fundraiser  Tobacco Use   Smoking status: Never   Smokeless tobacco: Never  Vaping Use   Vaping Use: Never used  Substance and Sexual Activity   Alcohol use: Yes    Alcohol/week: 1.0 standard drink of alcohol    Types: 1 Glasses of wine per week    Comment: Rarely,socially   Drug use: No   Sexual activity: Not on file  Other Topics Concern   Not on file  Social History Narrative   Retired from nursing    Widowed    Left-handed      Social Determinants of Health   Financial Resource Strain: Low Risk  (01/19/2022)   Overall Financial Resource Strain (CARDIA)  Difficulty of Paying Living Expenses: Not hard at all  Food Insecurity: No Food Insecurity  (01/19/2022)   Hunger Vital Sign    Worried About Running Out of Food in the Last Year: Never true    Ran Out of Food in the Last Year: Never true  Transportation Needs: No Transportation Needs (01/19/2022)   PRAPARE - Administrator, Civil Service (Medical): No    Lack of Transportation (Non-Medical): No  Physical Activity: Inactive (01/19/2022)   Exercise Vital Sign    Days of Exercise per Week: 0 days    Minutes of Exercise per Session: 0 min  Stress: No Stress Concern Present (01/19/2022)   [employer] of Occupational Health - Occupational Stress Questionnaire    Feeling of Stress : Not at all  Social Connections: Moderately Integrated (01/19/2022)   Social Connection and Isolation Panel [NHANES]    Frequency of Communication with Friends and Family: More than three times a week    Frequency of Social Gatherings with Friends and Family: More than three times a week    Attends Religious Services: More than 4 times per year    Active Member of [employer] or Organizations: Yes    Attends Banker Meetings: More than 4 times per year    Marital Status: Widowed  Intimate Partner Violence: Not At Risk (01/19/2022)   Humiliation, Afraid, Rape, and Kick questionnaire    Fear of Current or Ex-Partner: No    Emotionally Abused: No    Physically Abused: No    Sexually Abused: No      Levert Feinstein, M.D. Ph.D.  [facility] [address] [place], [place] [address] Ph: [phone] Fax: [phone]  CC:  Shirline Frees, NP [address] [place],  [place] [address]  Shirline Frees, NP

## 2022-09-13 NOTE — Telephone Encounter (Signed)
HST- Medicare/AARP no auth req   Patient is scheduled at [facility] for 10/11/22 at 1 pm.  Mailed packet to the patient.

## 2022-09-23 ENCOUNTER — Ambulatory Visit
Admission: RE | Admit: 2022-09-23 | Discharge: 2022-09-23 | Disposition: A | Discharge status: Home / Self Care | Payer: Medicare Other | Source: Ambulatory Visit | Attending: Neurology | Admitting: Neurology

## 2022-09-23 DIAGNOSIS — D500 Iron deficiency anemia secondary to blood loss (chronic): Secondary | ICD-10-CM

## 2022-09-23 DIAGNOSIS — H532 Diplopia: Secondary | ICD-10-CM | POA: Diagnosis not present

## 2022-09-23 DIAGNOSIS — R202 Paresthesia of skin: Secondary | ICD-10-CM | POA: Diagnosis not present

## 2022-09-23 DIAGNOSIS — R269 Unspecified abnormalities of gait and mobility: Secondary | ICD-10-CM

## 2022-09-23 DIAGNOSIS — R5383 Other fatigue: Secondary | ICD-10-CM

## 2022-09-27 NOTE — Progress Notes (Signed)
Please call and inform patient that MRI Cervical spine showed mild degenerative changes but there were no high grade stenosis to cause spinal compression or nerve root compression

## 2022-10-11 DIAGNOSIS — D500 Iron deficiency anemia secondary to blood loss (chronic): Secondary | ICD-10-CM | POA: Diagnosis not present

## 2022-10-11 DIAGNOSIS — R202 Paresthesia of skin: Secondary | ICD-10-CM | POA: Diagnosis not present

## 2022-10-11 DIAGNOSIS — E538 Deficiency of other specified B group vitamins: Secondary | ICD-10-CM | POA: Diagnosis not present

## 2022-10-12 DIAGNOSIS — H353112 Nonexudative age-related macular degeneration, right eye, intermediate dry stage: Secondary | ICD-10-CM | POA: Diagnosis not present

## 2022-10-12 DIAGNOSIS — H43813 Vitreous degeneration, bilateral: Secondary | ICD-10-CM | POA: Diagnosis not present

## 2022-10-12 DIAGNOSIS — Z961 Presence of intraocular lens: Secondary | ICD-10-CM | POA: Diagnosis not present

## 2022-10-12 DIAGNOSIS — H353221 Exudative age-related macular degeneration, left eye, with active choroidal neovascularization: Secondary | ICD-10-CM | POA: Diagnosis not present

## 2022-10-23 ENCOUNTER — Other Ambulatory Visit: Payer: Self-pay | Admitting: Adult Health

## 2022-10-23 DIAGNOSIS — F5101 Primary insomnia: Secondary | ICD-10-CM

## 2022-10-24 ENCOUNTER — Ambulatory Visit (INDEPENDENT_AMBULATORY_CARE_PROVIDER_SITE_OTHER): Payer: Medicare Other | Admitting: Neurology

## 2022-10-24 DIAGNOSIS — G4733 Obstructive sleep apnea (adult) (pediatric): Secondary | ICD-10-CM

## 2022-10-24 DIAGNOSIS — G478 Other sleep disorders: Secondary | ICD-10-CM

## 2022-10-24 DIAGNOSIS — G2581 Restless legs syndrome: Secondary | ICD-10-CM

## 2022-10-24 DIAGNOSIS — Z9189 Other specified personal risk factors, not elsewhere classified: Secondary | ICD-10-CM

## 2022-10-24 DIAGNOSIS — G4700 Insomnia, unspecified: Secondary | ICD-10-CM

## 2022-10-24 DIAGNOSIS — G4719 Other hypersomnia: Secondary | ICD-10-CM

## 2022-10-24 DIAGNOSIS — R5383 Other fatigue: Secondary | ICD-10-CM

## 2022-10-24 DIAGNOSIS — R351 Nocturia: Secondary | ICD-10-CM

## 2022-10-24 NOTE — Telephone Encounter (Signed)
Okay for refill?  

## 2022-10-26 ENCOUNTER — Telehealth: Payer: Self-pay | Admitting: Gastroenterology

## 2022-10-26 ENCOUNTER — Telehealth: Payer: Self-pay | Admitting: *Deleted

## 2022-10-26 DIAGNOSIS — K269 Duodenal ulcer, unspecified as acute or chronic, without hemorrhage or perforation: Secondary | ICD-10-CM

## 2022-10-26 NOTE — Telephone Encounter (Signed)
Inbound call from patient requesting to speak with someone in regards to her medications. States she would also like to discuss her Blood Count. Please advise.

## 2022-10-26 NOTE — Telephone Encounter (Signed)
-----   Message from Huston Foley, MD sent at 10/26/2022 10:43 AM EDT ----- Urgent set up requested on PAP therapy, due to severe OSA. Patient referred by Dr. Terrace Arabia, seen by me on 08/23/22, patient had a HST on 10/25/22.    Please call and notify the patient that the recent home sleep test showed obstructive sleep apnea in the severe range. I recommend treatment for this in the form of autoPAP, which means, that we don't have to bring her in for a sleep study with CPAP, but will let her start using a so called autoPAP machine at home, through a DME company (of her choice, or as per insurance requirement). The DME representative will fit the patient with a mask of choice, educate her on how to use the machine, how to put the mask on, etc. I have placed an order in the chart. Please send the order to a local DME, talk to patient, send report to referring MD. Please also reinforce the need for compliance with treatment. We will need a FU in sleep clinic for 10 weeks post-PAP set up, please arrange that with me or one of our NPs. Thanks,   Huston Foley, MD, PhD [facility]

## 2022-10-26 NOTE — Telephone Encounter (Signed)
Spoke to Patient gave sleep study results .  Made patient aware she is a urgent set up  do to severe OSA Pt chose [facility] for DME Gave pt [facility] #  Pt aware of insurance compliance . Made appointment  initial f/u 12/2022 . Sent referring MD sleep study results . Faxed over orders to [facility] this evening.

## 2022-10-26 NOTE — Addendum Note (Signed)
Addended by: Huston Foley on: 10/26/2022 10:43 AM   Modules accepted: Orders

## 2022-10-26 NOTE — Procedures (Signed)
[facility]  HOME SLEEP TEST (Watch PAT) REPORT  STUDY DATE: 10/25/22  DOB: 1940/05/06  MRN: [id number]  ORDERING CLINICIAN: Huston Foley, MD, PhD   REFERRING CLINICIAN: Dr. Levert Feinstein  CLINICAL INFORMATION/HISTORY: 82 year old female with an underlying medical history of anemia, iron deficiency, restless leg syndrome, lacunar stroke, history of macular degeneration, hyperlipidemia, history of breast cancer, status post surgery and chemoradiation, history of GI bleed, history of optic neuritis, status post lumbar decompression surgery, and mildly overweight state, who reports chronic difficulty sleeping, including difficulty falling asleep and staying asleep.    Epworth sleepiness score: 8/24.  BMI: 29.1 kg/m  FINDINGS:   Sleep Summary:   Total Recording Time (hours, min): 7 hours, 28 min  Total Sleep Time (hours, min):  6 hours, 25 min  Percent REM (%):    9.5%   Respiratory Indices:   Calculated pAHI (per hour):  45.7/hour         REM pAHI:    44/hour       NREM pAHI: 45.9/hour  Central pAHI: 2.2/hour  Oxygen Saturation Statistics:    Oxygen Saturation (%) Mean: 93%   Minimum oxygen saturation (%):                 78%   O2 Saturation Range (%): 78 - 98%    O2 Saturation (minutes) <=88%: 1.2 min  Pulse Rate Statistics:   Pulse Mean (bpm):    64/min    Pulse Range (54 - 90/min)   IMPRESSION: OSA (obstructive sleep apnea), severe  RECOMMENDATION:  This home sleep test demonstrates severe obstructive sleep apnea with a total AHI of 45.7/hour and O2 nadir of 78%. AHI by 4% desaturation criteria for hypopneas was 14.9/hour. Snoring was detected, in the mild to moderate range. Treatment with positive airway pressure is highly recommended. The patient will be advised to proceed with an autoPAP titration/trial at home. A laboratory attended titration study can be considered in the future for optimization of treatment settings and to improve  tolerance and compliance. Alternative treatment options are limited secondary to the severity of the patient's sleep disordered breathing, but may include surgical treatment with an implantable hypoglossal nerve stimulator (in carefully selected candidates, meeting criteria).  Concomitant weight loss is recommended (where clinically appropriate). Please note, that untreated obstructive sleep apnea may carry additional perioperative morbidity. Patients with significant obstructive sleep apnea should receive perioperative PAP therapy and the surgeons and particularly the anesthesiologist should be informed of the diagnosis and the severity of the sleep disordered breathing. The patient should be cautioned not to drive, work at heights, or operate dangerous or heavy equipment when tired or sleepy. Review and reiteration of good sleep hygiene measures should be pursued with any patient. Other causes of the patient's symptoms, including circadian rhythm disturbances, an underlying mood disorder, medication effect and/or an underlying medical problem cannot be ruled out based on this test. Clinical correlation is recommended.  The patient and her referring provider will be notified of the test results. The patient will be seen in follow up in sleep clinic at [facility].  I certify that I have reviewed the raw data recording prior to the issuance of this report in accordance with the standards of the American Academy of Sleep Medicine (AASM).  INTERPRETING PHYSICIAN:   Huston Foley, MD, PhD Medical Director, [facility]) Diplomat, ABPN (Neurology and Sleep)   [facility] [address] [place], [place] [address] [phone]

## 2022-10-27 MED ORDER — PANTOPRAZOLE SODIUM 40 MG PO TBEC: DELAYED_RELEASE_TABLET | ORAL | 0 refills | Status: DC

## 2022-10-27 NOTE — Telephone Encounter (Signed)
Spoke with patient at length this morning. Pt wanted to review all of her medications because she states that she is on several medications. Pt wanted to determine if there were some meds that she can discontinue or change. I explained to patient that she should reconcile medications with PCP. The only medications that I could check on are Protonix, Carafate, and Voquezna. Pt states that she has been seeing [facility], received iron infusions - anemia has resolved, although pt stated that her levels were not normal (results in care everywhere). Pt states that she still feels bad. I explained to patient that this could be from her low B12 level. They started her on weekly B12 injections, then monthly for 3 months and plan to check labs at that time. Pt was also recently diagnosed with severe sleep apnea. Pt wanted to know if Gabapentin could make her sleep apnea worse. I told pt that this is something that she should discuss with neurologist when discussing treatment plan for severe OSA. Pt wanted to know how we knew if the ulcers were healed. Pt states that the Voquezna works and makes her feel better, but the pain returns if she misses a dose. Pt has been off of medication for about 2 days now, I told her that we could refill this for her since it is working. Pt states that her co-pay is $300 for a 30 day supply. I told pt that I can reach out to the pharmacy team to see if she qualifies for any patient assistance or savings card. I told pt that she likely won't since she has Medicare, but we will check. Patient also reports that she is still taking Protonix 80 mg BID and Carafate. Patient wanted to know how we would know if her ulcer had healed. Pt does not want to have a repeat EGD if it is not necessary. Pt denies any signs of active bleeding. She reports having a dark stool every now and then. Stomach pain is controlled with Voquezna. Please advise on what GI medications pt is required to continue at this  time. Please advise, thanks.

## 2022-10-27 NOTE — Telephone Encounter (Signed)
Spoke with pt and she will stay on the protonix, she did not want an EGD or an OV, states she will ride it out.

## 2022-10-27 NOTE — Telephone Encounter (Signed)
Thanks for taking her colon going through all of this with her.  To clarify, I had only increased her Protonix to 80 mg twice daily until she got the Voquezna.  Once she got the Voquezna, she was to stop the Protonix.  This is outlined in her procedure note.  She should not be taking both of these, only 1 or the other.  If she feels that the Voquezna has helped her more than she should continue that if she can get it approved by her insurance/pharmacy.  If she cannot get the Voquezna then she should continue the Protonix.  The only way to know if the ulcers have healed would be to do a repeat endoscopy.  If she is feeling well and her blood counts are stable, CBC is normal, we do not have to repeat the exam if she wants to monitor this.  If she is anxious about it we could repeat the endoscopy.  It might be best to see her in the office to discuss this further, if you can book her for a follow-up visit with me.  Thank you

## 2022-11-24 DIAGNOSIS — M25552 Pain in left hip: Secondary | ICD-10-CM | POA: Diagnosis not present

## 2022-11-26 DIAGNOSIS — M25552 Pain in left hip: Secondary | ICD-10-CM | POA: Diagnosis not present

## 2022-11-28 ENCOUNTER — Other Ambulatory Visit: Payer: Self-pay | Admitting: Medical

## 2022-11-28 DIAGNOSIS — M25552 Pain in left hip: Secondary | ICD-10-CM

## 2022-12-04 ENCOUNTER — Ambulatory Visit
Admission: RE | Admit: 2022-12-04 | Discharge: 2022-12-04 | Disposition: A | Discharge status: Home / Self Care | Payer: Medicare Other | Source: Ambulatory Visit | Attending: Medical | Admitting: Medical

## 2022-12-04 DIAGNOSIS — M25552 Pain in left hip: Secondary | ICD-10-CM

## 2022-12-04 DIAGNOSIS — Z96642 Presence of left artificial hip joint: Secondary | ICD-10-CM | POA: Diagnosis not present

## 2022-12-07 DIAGNOSIS — M7062 Trochanteric bursitis, left hip: Secondary | ICD-10-CM | POA: Diagnosis not present

## 2022-12-07 DIAGNOSIS — Z96642 Presence of left artificial hip joint: Secondary | ICD-10-CM | POA: Diagnosis not present

## 2022-12-14 ENCOUNTER — Encounter (INDEPENDENT_AMBULATORY_CARE_PROVIDER_SITE_OTHER): Payer: Self-pay

## 2022-12-27 ENCOUNTER — Other Ambulatory Visit (HOSPITAL_COMMUNITY): Payer: Self-pay | Admitting: Orthopedic Surgery

## 2022-12-27 DIAGNOSIS — Z96642 Presence of left artificial hip joint: Secondary | ICD-10-CM

## 2023-01-02 DIAGNOSIS — C44719 Basal cell carcinoma of skin of left lower limb, including hip: Secondary | ICD-10-CM | POA: Diagnosis not present

## 2023-01-02 DIAGNOSIS — L905 Scar conditions and fibrosis of skin: Secondary | ICD-10-CM | POA: Diagnosis not present

## 2023-01-02 DIAGNOSIS — D485 Neoplasm of uncertain behavior of skin: Secondary | ICD-10-CM | POA: Diagnosis not present

## 2023-01-02 DIAGNOSIS — C44612 Basal cell carcinoma of skin of right upper limb, including shoulder: Secondary | ICD-10-CM | POA: Diagnosis not present

## 2023-01-02 DIAGNOSIS — Z85828 Personal history of other malignant neoplasm of skin: Secondary | ICD-10-CM | POA: Diagnosis not present

## 2023-01-05 ENCOUNTER — Ambulatory Visit (HOSPITAL_COMMUNITY)
Admission: RE | Admit: 2023-01-05 | Discharge: 2023-01-05 | Disposition: A | Discharge status: Home / Self Care | Payer: Medicare Other | Source: Ambulatory Visit | Attending: Orthopedic Surgery | Admitting: Orthopedic Surgery

## 2023-01-05 ENCOUNTER — Encounter (HOSPITAL_COMMUNITY)
Admission: RE | Admit: 2023-01-05 | Discharge: 2023-01-05 | Disposition: A | Discharge status: Home / Self Care | Payer: Medicare Other | Source: Ambulatory Visit | Attending: Orthopedic Surgery | Admitting: Orthopedic Surgery

## 2023-01-05 DIAGNOSIS — Z96642 Presence of left artificial hip joint: Secondary | ICD-10-CM | POA: Diagnosis not present

## 2023-01-05 MED ORDER — TECHNETIUM TC 99M MEDRONATE IV KIT
20.0000 | PACK | Freq: Once | INTRAVENOUS | Status: AC | PRN
  Administered 2023-01-05: 20 via INTRAVENOUS

## 2023-01-08 ENCOUNTER — Encounter (HOSPITAL_BASED_OUTPATIENT_CLINIC_OR_DEPARTMENT_OTHER): Payer: Self-pay | Admitting: Cardiology

## 2023-01-08 ENCOUNTER — Ambulatory Visit (HOSPITAL_BASED_OUTPATIENT_CLINIC_OR_DEPARTMENT_OTHER): Payer: Medicare Other | Admitting: Cardiology

## 2023-01-08 VITALS — BP 132/84 | HR 62 | Ht 67.0 in | Wt 187.5 lb

## 2023-01-08 DIAGNOSIS — R011 Cardiac murmur, unspecified: Secondary | ICD-10-CM

## 2023-01-08 DIAGNOSIS — R55 Syncope and collapse: Secondary | ICD-10-CM | POA: Diagnosis not present

## 2023-01-08 DIAGNOSIS — Z8673 Personal history of transient ischemic attack (TIA), and cerebral infarction without residual deficits: Secondary | ICD-10-CM

## 2023-01-08 DIAGNOSIS — Z96642 Presence of left artificial hip joint: Secondary | ICD-10-CM | POA: Diagnosis not present

## 2023-01-08 DIAGNOSIS — M25559 Pain in unspecified hip: Secondary | ICD-10-CM | POA: Diagnosis not present

## 2023-01-08 DIAGNOSIS — M791 Myalgia, unspecified site: Secondary | ICD-10-CM

## 2023-01-08 DIAGNOSIS — I2510 Atherosclerotic heart disease of native coronary artery without angina pectoris: Secondary | ICD-10-CM | POA: Diagnosis not present

## 2023-01-08 DIAGNOSIS — T466X5D Adverse effect of antihyperlipidemic and antiarteriosclerotic drugs, subsequent encounter: Secondary | ICD-10-CM

## 2023-01-08 DIAGNOSIS — T466X5A Adverse effect of antihyperlipidemic and antiarteriosclerotic drugs, initial encounter: Secondary | ICD-10-CM

## 2023-01-08 NOTE — Progress Notes (Signed)
Cardiology Office Note:  .    Date:  01/08/2023  ID:  Shelley Coleman, DOB 01/14/1941, MRN [id number] PCP: Shirline Frees, NP  [facility] Providers Cardiologist:  Jodelle Red, MD     History of Present Illness: .    Shelley Coleman is a 82 y.o. female with a hx of hypertension, hyperlipidemia, aortic atherosclerosis, prior CVA, history of GI bleed 10/2020, who is seen for follow-up. I initially met her 06/02/2021 as a new consult at the request of Shirline Frees, NP for the evaluation and management of dyspnea on exertion.   CV risk factors: Has a strong family history of CAD. Father had 7 MI's (first age 9) and 2 CVA. Both brothers have stents. Mother died of angiosarcoma at age 6.   Former Administrator, arts.  She had a cardiac/coronary CT 06/16/21, showing a coronary calcium score of 440. And an aortic valve calcium score of 153. There was concern for a significant stenosis in the mRCA, and this was borderline on FFR. Previously developed severe body aches and myalgias on statin therapy. Now tolerating Leqvio (PCSK9i)   At her visit 06/2022, she complained of shortness of breath and audible wheezing with walking short distances. Felt presyncopal at times when standing up with lightheadedness; she associated these episodes with her GI bleeding and anemia. She had received an iron infusion a few weeks prior at [facility]. She was seen by Dr. Adela Lank and reported melena. Subsequent EGD showed persistence of duodenal ulcers, and large hiatal hernia, both of which could be related to her symptoms. Continued Plavix given history of CVA and recommended for Voquezna. She had an echo 06/2022 revealing LVEF 55-60%, trivial mitral regurgitation, and aortic valve sclerosis/calcification without evidence of aortic stenosis.  Today, she reports feeling okay from a cardiovascular perspective. Her main complaint is left hip pain that is limiting her activity, recently treated with cortisone and followed by Dr.  Lequita Halt. She has been able to walk farther with the assistance of a walker. Recently had removal of a lesion on her left thigh and is awaiting pathology report. Lately she has noticed some increased peripheral edema as the rings on her finger feel more tight.  She had a syncopal episode without prodrome about 2 months ago in the setting of taking her dog to the vet. She opened the car door, and the next thing she remembers is waking up on the pavement. She notes that she was evaluated in the ER with unrevealing workup. This was an isolated incident, with no recurring episodes.   In the office her blood pressure is 145/75 initially, and 132/84 on manual recheck.  Continues to have intermittent periods of more severe indigestion and acid reflux. Not associated with her syncopal episode.   She continues to follow with hematology at [facility]. Now receives B12 injections every few weeks.   She denies any palpitations, chest pain, shortness of breath, lightheadedness, headaches, orthopnea, or PND.  ROS:  Please see the history of present illness. ROS otherwise negative except as noted.  (+) Syncope  (+) Left hip pain (+) Peripheral edema (+) Acid reflux  Studies Reviewed: .         Echo  07/20/2022:  1. Left ventricular ejection fraction, by estimation, is 55 to 60%. The  left ventricle has normal function. The left ventricle has no regional  wall motion abnormalities. Left ventricular diastolic parameters are  indeterminate.   2. Right ventricular systolic function is normal. The right ventricular  size is  normal. Tricuspid regurgitation signal is inadequate for assessing  PA pressure.   3. The mitral valve is grossly normal. Trivial mitral valve  regurgitation. No evidence of mitral stenosis.   4. The aortic valve is abnormal. There is moderate calcification of the  aortic valve. Aortic valve regurgitation is not visualized. Aortic valve  sclerosis/calcification is present, without any  evidence of aortic  stenosis.   5. The inferior vena cava is normal in size with greater than 50%  respiratory variability, suggesting right atrial pressure of 3 mmHg.   Physical Exam:    VS:  BP 132/84 (BP Location: Left Arm, Patient Position: Sitting, Cuff Size: Large)   Pulse 62   Ht 5\' 7"  (1.702 m)   Wt 187 lb 8 oz (85 kg)   SpO2 98%   BMI 29.37 kg/m    Wt Readings from Last 3 Encounters:  01/08/23 187 lb 8 oz (85 kg)  09/13/22 185 lb (83.9 kg)  08/23/22 185 lb 3.2 oz (84 kg)    GEN: Well nourished, well developed in no acute distress HEENT: Normal, moist mucous membranes NECK: No JVD CARDIAC: regular rhythm, normal S1 and S2, no rubs or gallops. 2/6 early peaking harsh systolic murmur. VASCULAR: Radial and DP pulses 2+ bilaterally. No carotid bruits RESPIRATORY:  Clear to auscultation without rales, wheezing or rhonchi  ABDOMEN: Soft, non-tender, non-distended MUSCULOSKELETAL:  Ambulates independently SKIN: Warm and dry, no edema NEUROLOGIC:  Alert and oriented x 3. No focal neuro deficits noted. PSYCHIATRIC:  Normal affect   ASSESSMENT AND PLAN: .    Syncope -no prodrome, no post ictal symptoms.  -reviewed recent echo, unremarkable -if recurs, would get monitor to exclude arrhythmias/heart block. She will contact me if this recurs. Reviewed red flag warning signs that need immediate medical attention -I did discuss the [place] DMV medical guidelines for driving: "it is prudent to recommend that all persons should be free of syncopal episodes for at least six months to be granted the driving privilege." (THE [place] PHYSICIAN'S GUIDE TO DRIVER MEDICAL EVALUATION, Second Edition, Medical Review Branch, Associate Professor, Division of [employer] of Transportation, July 2004)   Coronary artery disease, with borderline significant stenosis in the [facility] -based on results of CT coronary.  Reviewed when/how to use SL NG and when to call  911 -discussed medical management, indications for cardiac cath/stent   Hypertension -continue atenolol 50 mg daily -has history of arrhythmia/PVC, reason atenolol was initially started, but found to have thyroid issues around the same time   History of CVA Hyperlipidemia Statin myalgia -had myalgia on statin, entire family has issues with them -on clopidogrel -now on Leqvio (PCSK9i), tolerating well. Has not received word re: next dose. Reached out to pharmacy team, they will work with infusion clinic to schedule.   Murmur -consistent with aortic sclerosis without stenosis   Cardiac risk counseling and prevention recommendations: -recommend heart healthy/Mediterranean diet, with whole grains, fruits, vegetable, fish, lean meats, nuts, and olive oil. Limit salt. -recommend moderate walking, 3-5 times/week for 30-50 minutes each session. Aim for at least 150 minutes.week. Goal should be pace of 3 miles/hours, or walking 1.5 miles in 30 minutes -recommend avoidance of tobacco products. Avoid excess alcohol.  Dispo: Follow-up in 6 months, or sooner as needed.  I,Mathew Stumpf,acting as a Neurosurgeon for [employer], MD.,have documented all relevant documentation on the behalf of Jodelle Red, MD,as directed by  Jodelle Red, MD while in the presence of Jodelle Red, MD.  I, Jodelle Red, MD, have reviewed all documentation for this visit. The documentation on 01/08/23 for the exam, diagnosis, procedures, and orders are all accurate and complete.   Signed, Jodelle Red, MD

## 2023-01-08 NOTE — Patient Instructions (Signed)
Medication Instructions:  Your physician recommends that you continue on your current medications as directed. Please refer to the Current Medication list given to you today.   Follow-Up: At [facility], you and your health needs are our priority.  As part of our continuing mission to provide you with exceptional heart care, we have created designated Provider Care Teams.  These Care Teams include your primary Cardiologist (physician) and Advanced Practice Providers (APPs -  Physician Assistants and Nurse Practitioners) who all work together to provide you with the care you need, when you need it.  We recommend signing up for the patient portal called "MyChart".  Sign up information is provided on this After Visit Summary.  MyChart is used to connect with patients for Virtual Visits (Telemedicine).  Patients are able to view lab/test results, encounter notes, upcoming appointments, etc.  Non-urgent messages can be sent to your provider as well.   To learn more about what you can do with MyChart, go to [email or web address].    Your next appointment:   6 months with Dr. Cristal Deer

## 2023-01-09 ENCOUNTER — Telehealth: Payer: Self-pay | Admitting: Pharmacy Technician

## 2023-01-09 NOTE — Telephone Encounter (Signed)
Auth Submission: NO AUTH NEEDED Site of care: Site of care: [facility] Payer: Medicare A/B & AARP Medication & CPT/J Code(s) submitted: Leqvio (Inclisiran) [phone] Route of submission (phone, fax, portal):  Phone # Fax # Auth type: Buy/Bill HB Units/visits requested: X1 Reference number:  Approval from: 01/09/23 to 05/01/23

## 2023-01-10 ENCOUNTER — Telehealth: Payer: Self-pay

## 2023-01-10 NOTE — Telephone Encounter (Signed)
Auth Submission: NO AUTH NEEDED Site of care: Site of care: [facility] Payer: Medicare A/B and supplement Medication & CPT/J Code(s) submitted: Leqvio (Inclisiran) J1306 Route of submission (phone, fax, portal):  Phone # Fax # Auth type: Buy/Bill PB Units/visits requested: 284mg  x 1 dose Reference number:  Approval from: 01/10/23 to 06/01/23   Medicare A/B will cover 80%, supplement will cover the remaining 20%.

## 2023-01-11 DIAGNOSIS — Z96641 Presence of right artificial hip joint: Secondary | ICD-10-CM | POA: Diagnosis not present

## 2023-01-11 DIAGNOSIS — M7062 Trochanteric bursitis, left hip: Secondary | ICD-10-CM | POA: Diagnosis not present

## 2023-01-16 ENCOUNTER — Ambulatory Visit (INDEPENDENT_AMBULATORY_CARE_PROVIDER_SITE_OTHER): Payer: Medicare Other

## 2023-01-16 VITALS — BP 116/77 | HR 70 | Temp 97.5°F | Resp 18 | Ht 67.0 in | Wt 186.4 lb

## 2023-01-16 DIAGNOSIS — E782 Mixed hyperlipidemia: Secondary | ICD-10-CM | POA: Diagnosis not present

## 2023-01-16 MED ORDER — INCLISIRAN SODIUM 284 MG/1.5ML ~~LOC~~ SOSY: 284.0000 mg | PREFILLED_SYRINGE | Freq: Once | SUBCUTANEOUS | Status: DC

## 2023-01-16 MED ORDER — INCLISIRAN SODIUM 284 MG/1.5ML ~~LOC~~ SOSY
284.0000 mg | PREFILLED_SYRINGE | Freq: Once | SUBCUTANEOUS | Status: AC
  Administered 2023-01-16: 284 mg via SUBCUTANEOUS
  Filled 2023-01-16: qty 1.5

## 2023-01-16 NOTE — Progress Notes (Signed)
Diagnosis: Hyperlipidemia  Provider:  Chilton Greathouse MD  Procedure: Injection  Leqvio (inclisiran), Dose: 284 mg, Site: subcutaneous, Number of injections: 1  Post Care: Patient declined observation  Discharge: Condition: Good, Destination: Home . AVS Provided  Performed by:  Adriana Mccallum, RN

## 2023-01-17 ENCOUNTER — Other Ambulatory Visit: Payer: Self-pay | Admitting: Adult Health

## 2023-01-17 DIAGNOSIS — D500 Iron deficiency anemia secondary to blood loss (chronic): Secondary | ICD-10-CM | POA: Diagnosis not present

## 2023-01-17 DIAGNOSIS — R202 Paresthesia of skin: Secondary | ICD-10-CM | POA: Diagnosis not present

## 2023-01-17 DIAGNOSIS — E538 Deficiency of other specified B group vitamins: Secondary | ICD-10-CM | POA: Diagnosis not present

## 2023-01-17 DIAGNOSIS — I10 Essential (primary) hypertension: Secondary | ICD-10-CM

## 2023-01-23 ENCOUNTER — Ambulatory Visit (INDEPENDENT_AMBULATORY_CARE_PROVIDER_SITE_OTHER): Payer: Medicare Other | Admitting: Family Medicine

## 2023-01-23 VITALS — BP 116/66 | HR 65 | Ht 67.5 in | Wt 148.0 lb

## 2023-01-23 DIAGNOSIS — Z0000 Encounter for general adult medical examination without abnormal findings: Secondary | ICD-10-CM | POA: Diagnosis not present

## 2023-01-23 NOTE — Patient Instructions (Addendum)
I really enjoyed getting to talk with you today! I am available on Tuesdays and Thursdays for virtual visits if you have any questions or concerns, or if I can be of any further assistance.   CHECKLIST FROM ANNUAL WELLNESS VISIT:  -Follow up (please call to schedule if not scheduled after visit):   -yearly for annual wellness visit with primary care office  Here is a list of your preventive care/health maintenance measures and the plan for each if any are due:  PLAN For any measures below that may be due:  -can get the covid, flu and shingles vaccines at the pharmacy  Health Maintenance  Topic Date Due   Zoster Vaccines- Shingrix (1 of 2) 07/04/1959   COVID-19 Vaccine (5 - 2023-24 season) 12/31/2022   INFLUENZA VACCINE  07/30/2023 (Originally 11/30/2022)   Medicare Annual Wellness (AWV)  01/23/2024   Pneumonia Vaccine 19+ Years old  Completed   DEXA SCAN  Completed   HPV VACCINES  Aged Out   DTaP/Tdap/Td  Discontinued   Hepatitis C Screening  Discontinued    -See a dentist at least yearly  -Get your eyes checked and then per your eye specialist's recommendations  -Other issues addressed today:   -I have included below further information regarding a healthy whole foods based diet, physical activity guidelines for adults, stress management and opportunities for social connections. I hope you find this information useful.   -----------------------------------------------------------------------------------------------------------------------------------------------------------------------------------------------------------------------------------------------------------  NUTRITION: -eat real food: lots of colorful vegetables (half the plate) and fruits -5-7 servings of vegetables and fruits per day (fresh or steamed is best), exp. 2 servings of vegetables with lunch and dinner and 2 servings of fruit per day. Berries and greens such as kale and collards are great choices.   -consume on a regular basis: whole grains (make sure first ingredient on label contains the word "whole"), fresh fruits, fish, nuts, seeds, healthy oils (such as olive oil, avocado oil, grape seed oil) -may eat small amounts of dairy and lean meat on occasion, but avoid processed meats such as ham, bacon, lunch meat, etc. -drink water -try to avoid fast food and pre-packaged foods, processed meat -most experts advise limiting sodium to < 2300mg  per day, should limit further is any chronic conditions such as high blood pressure, heart disease, diabetes, etc. The American Heart Association advised that < 1500mg  is is ideal -try to avoid foods that contain any ingredients with names you do not recognize  -try to avoid sugar/sweets (except for the natural sugar that occurs in fresh fruit) -try to avoid sweet drinks -try to avoid white rice, white bread, pasta (unless whole grain), white or yellow potatoes  EXERCISE GUIDELINES FOR ADULTS: -if you wish to increase your physical activity, do so gradually and with the approval of your doctor -STOP and seek medical care immediately if you have any chest pain, chest discomfort or trouble breathing when starting or increasing exercise  -move and stretch your body, legs, feet and arms when sitting for long periods -Physical activity guidelines for optimal health in adults: -least 150 minutes per week of aerobic exercise (can talk, but not sing) once approved by your doctor, 20-30 minutes of sustained activity or two 10 minute episodes of sustained activity every day.  -resistance training at least 2 days per week if approved by your doctor -balance exercises 3+ days per week:   Stand somewhere where you have something sturdy to hold onto if you lose balance.    1) lift up on toes, start  with 5x per day and work up to 20x   2) stand and lift on leg straight out to the side so that foot is a few inches of the floor, start with 5x each side and work up to 20x  each side   3) stand on one foot, start with 5 seconds each side and work up to 20 seconds on each side  If you need ideas or help with getting more active:  -Silver sneakers https://tools.silversneakers.com  -Walk with a Doc: http://www.duncan-williams.com/  -try to include resistance (weight lifting/strength building) and balance exercises twice per week: or the following link for ideas: http://castillo-powell.com/  BuyDucts.dk  STRESS MANAGEMENT: -can try meditating, or just sitting quietly with deep breathing while intentionally relaxing all parts of your body for 5 minutes daily -if you need further help with stress, anxiety or depression please follow up with your primary doctor or contact the wonderful folks at [facility]: [phone]  SOCIAL CONNECTIONS: -options in [place] if you wish to engage in more social and exercise related activities:  -Silver sneakers https://tools.silversneakers.com  -Walk with a Doc: http://www.duncan-williams.com/  -Check out the [facility] Active Adults 50+ section on the [place] of [employer] (hiking clubs, book clubs, cards and games, chess, exercise classes, aquatic classes and much more) - see the website for details: https://www.[place].gov/departments/parks-recreation/active-adults50  -YouTube has lots of exercise videos for different ages and abilities as well  -[facility] (a variety of indoor and outdoor inperson activities for adults). ([phone]. [address]  -Virtual Online Classes (a variety of topics): see seniorplanet.org or call [phone]  -consider volunteering at a school, hospice center, church, senior center or elsewhere

## 2023-01-23 NOTE — Progress Notes (Signed)
 Per patient no change in vitals since last visit, unable to obtain new vitals due to telehealth visit

## 2023-01-23 NOTE — Progress Notes (Signed)
nPATIENT CHECK-IN and HEALTH RISK ASSESSMENT QUESTIONNAIRE:  -completed by phone/video for upcoming Medicare Preventive Visit  Pre-Visit Check-in: 1)Vitals (height, wt, BP, etc) - record in vitals section for visit on day of visit Request home vitals (wt, BP, etc.) and enter into vitals, THEN update Vital Signs SmartPhrase below at the top of the HPI. See below.  2)Review and Update Medications, Allergies PMH, Surgeries, Social history in Epic 3)Hospitalizations in the last year with date/reason? no 4)Review and Update Care Team (patient's specialists) in Epic 5) Complete PHQ9 in Epic  6) Complete Fall Screening in Epic 7)Review all Health Maintenance Due and order under PCP if not done.  8)Medicare Wellness Questionnaire: Answer theses question about your habits: Do you drink alcohol? Rarely  If yes, how many drinks do you have a day? 1 glass once per month Have you ever smoked?years ago Quit date if applicable? NA "didn't really smoke" How many packs a day do/did you smoke? na Do you use smokeless tobacco? no Do you use an illicit drugs?no Do you exercises? Used to - now only her housework and taking care of her pets - she is going to start some physical therapy Are you sexually active? No Number of partners?na Typical breakfast: egg and bacon, or cereal, or toast Typical lunch: leftovers or sandwich Typical dinner:meatloaf, green beans Typical snacks: brownies, pound cake, fruit  Beverages:  water, tea Her children are trying to get her to do more  Answer theses question about you: Can you perform most household chores? Yes. Can't do yard work. Do you find it hard to follow a conversation in a noisy room?  patient wears hearing aids Do you often ask people to speak up or repeat themselves? no Do you feel that you have a problem with memory? no Do you balance your checkbook and or bank acounts? yes Do you feel safe at home?yes. Worry about people coming to the door I may not  know. Last dentist visit? 1 year Do you need assistance with any of the following: Please note if so - No   Driving?  Feeding yourself?  Getting from bed to chair?  Getting to the toilet?  Bathing or showering?  Dressing yourself?  Managing money?  Climbing a flight of stairs  Preparing meals?  Do you have Advanced Directives in place (Living Will, Healthcare Power or Attorney)?  yes   Last eye Exam and location? 2 1/2 month Dr Valentina Lucks [facility] - reports vision is great but is being treated for macular degeneration   Do you currently use prescribed or non-prescribed narcotic or opioid pain medications? yes  Do you have a history or close family history of breast, ovarian, tubal or peritoneal cancer or a family member with BRCA (breast cancer susceptibility 1 and 2) gene mutations? Patient had breast cancer  Request home vitals (wt, BP, etc.) and enter into vitals, THEN update Vital Signs SmartPhrase below at the top of the HPI. See below.   Nurse/Assistant Credentials/time stamp:   ---------------------[name] CMA -------------------------------------------------------------------------------------------------------------------------------------------------------------------------------------------------  Vital Signs: Vital signs are patient reported.   MEDICARE ANNUAL PREVENTIVE VISIT WITH PROVIDER: (Welcome to Medicare, initial annual wellness or annual wellness exam)  Virtual Visit via Phone Note  I connected with Paris Lore on 01/23/23 by phone and verified that I am speaking with the correct person using two identifiers.  Location patient: home Location provider:work or home office Persons participating in the virtual visit: patient, provider  Concerns and/or follow up today: Unfortunately has  had a rough year due to GI bleeding, has been seeing GI specialist and hematologist. Had blood work last week with her hematologist.    See HM section in Epic for  other details of completed HM.    ROS: negative for report of fevers, unintentional weight loss, vision changes, vision loss, hearing loss or change, chest pain, sob, hemoptysis, no blood in stools or melena recently, hematuria, falls, bleeding or bruising, thoughts of suicide or self harm, memory loss  Patient-completed extensive health risk assessment - reviewed and discussed with the patient: See Health Risk Assessment completed with patient prior to the visit either above or in recent phone note. This was reviewed in detailed with the patient today and appropriate recommendations, orders and referrals were placed as needed per Summary below and patient instructions.   Review of Medical History: -PMH, PSH, Family History and current specialty and care providers reviewed and updated and listed below   Patient Care Team: Shirline Frees, NP as PCP - General (Family Medicine) Jodelle Red, MD as PCP - Cardiology (Cardiology) Ihor Gully, MD (Inactive) as Consulting Physician (Urology) Verner Chol, [facility] (Inactive) as Pharmacist (Pharmacist)   Past Medical History:  Diagnosis Date   Anemia    Has had iron infusions   Breast cancer [facility])    right   Cerebrovascular disease 11/12/2018   Depression    Depression    GERD (gastroesophageal reflux disease)    Hiatal hernia    History of GI bleed    Hx of mitral valve prolapse    Hyperlipidemia    Irregular heart beat    Neuropathy    numbness in both feet   Nocturnal leg cramps 09/02/2015   Optic neuritis    left   Osteopenia    Ovarian cyst    Parotid tumor    right   Pelvic fracture (HCC)    Restless legs syndrome    Shingles 2000   Left in V1   Stroke (HCC)    Syncopal episodes 06/28/2022    Past Surgical History:  Procedure Laterality Date   ABDOMINAL HYSTERECTOMY     APPENDECTOMY     BREAST LUMPECTOMY Left    CATARACT EXTRACTION     ESOPHAGEAL MANOMETRY N/A 01/13/2013   Procedure: ESOPHAGEAL  MANOMETRY (EM);  Surgeon: Mardella Layman, MD;  Location: [facility] ENDOSCOPY;  Service: Endoscopy;  Laterality: N/A;   ESOPHAGOGASTRODUODENOSCOPY (EGD) WITH PROPOFOL N/A 10/30/2020   Procedure: ESOPHAGOGASTRODUODENOSCOPY (EGD) WITH PROPOFOL;  Surgeon: Napoleon Form, MD;  Location: [facility] ENDOSCOPY;  Service: Endoscopy;  Laterality: N/A;   FOREIGN BODY REMOVAL  10/30/2020   Procedure: FOREIGN BODY REMOVAL;  Surgeon: Napoleon Form, MD;  Location: [facility] ENDOSCOPY;  Service: Endoscopy;;   HERNIA REPAIR  08/2009   HOT HEMOSTASIS N/A 10/30/2020   Procedure: HOT HEMOSTASIS (ARGON PLASMA COAGULATION/BICAP);  Surgeon: Napoleon Form, MD;  Location: [facility] ENDOSCOPY;  Service: Endoscopy;  Laterality: N/A;   MASTECTOMY Right    SPINE SURGERY     TOTAL HIP ARTHROPLASTY Left     Social History   Socioeconomic History   Marital status: Widowed    Spouse name: Not on file   Number of children: 2   Years of education: 33   Highest education level: Not on file  Occupational History   Occupation: Retired Charity fundraiser  Tobacco Use   Smoking status: Never   Smokeless tobacco: Never  Vaping Use   Vaping status: Never Used  Substance and Sexual Activity   Alcohol  use: Yes    Alcohol/week: 1.0 standard drink of alcohol    Types: 1 Glasses of wine per week    Comment: Rarely,socially   Drug use: No   Sexual activity: Not on file  Other Topics Concern   Not on file  Social History Narrative   Retired from nursing    Widowed    Left-handed      Social Determinants of Health   Financial Resource Strain: Low Risk  (01/19/2022)   Overall Financial Resource Strain (CARDIA)    Difficulty of Paying Living Expenses: Not hard at all  Food Insecurity: No Food Insecurity (01/19/2022)   Hunger Vital Sign    Worried About Running Out of Food in the Last Year: Never true    Ran Out of Food in the Last Year: Never true  Transportation Needs: No Transportation Needs (01/19/2022)   PRAPARE - Scientist, research (physical sciences) (Medical): No    Lack of Transportation (Non-Medical): No  Physical Activity: Inactive (01/19/2022)   Exercise Vital Sign    Days of Exercise per Week: 0 days    Minutes of Exercise per Session: 0 min  Stress: No Stress Concern Present (01/19/2022)   [employer] of Occupational Health - Occupational Stress Questionnaire    Feeling of Stress : Not at all  Social Connections: Moderately Integrated (01/19/2022)   Social Connection and Isolation Panel [NHANES]    Frequency of Communication with Friends and Family: More than three times a week    Frequency of Social Gatherings with Friends and Family: More than three times a week    Attends Religious Services: More than 4 times per year    Active Member of [employer] or Organizations: Yes    Attends Banker Meetings: More than 4 times per year    Marital Status: Widowed  Intimate Partner Violence: Not At Risk (01/19/2022)   Humiliation, Afraid, Rape, and Kick questionnaire    Fear of Current or Ex-Partner: No    Emotionally Abused: No    Physically Abused: No    Sexually Abused: No    Family History  Problem Relation Age of Onset   Cancer Mother        Angio sarcoma    Heart disease Father    Hypertension Father    Stroke Father        x2   Heart attack Father        x 11   Hernia Brother    Hypertension Brother    Neuropathy Neg Hx    Restless legs syndrome Neg Hx    Colon cancer Neg Hx    Esophageal cancer Neg Hx    Pancreatic cancer Neg Hx    Stomach cancer Neg Hx    Rectal cancer Neg Hx    Sleep apnea Neg Hx     Current Outpatient Medications on File Prior to Visit  Medication Sig Dispense Refill   acetaminophen (TYLENOL) 500 MG tablet Take 1,000 mg by mouth every 6 (six) hours as needed for moderate pain or headache.     ALPRAZolam (XANAX) 1 MG tablet TAKE 1/2 TO 1 (ONE-HALF TO ONE) TABLET BY MOUTH TWICE DAILY AS NEEDED FOR ANXIETY 30 tablet 1   atenolol (TENORMIN) 50 MG tablet Take 1 tablet  by mouth once daily 90 tablet 0   bevacizumab (AVASTIN) 400 MG/16ML SOLN Inject 400 mg into the vein once. 16 ML     clopidogrel (PLAVIX) 75 MG tablet Take 1  tablet by mouth once daily 90 tablet 0   ferrous sulfate 325 (65 FE) MG tablet Take by mouth.     gabapentin (NEURONTIN) 300 MG capsule Take 3 capsules (900 mg total) by mouth at bedtime. 270 capsule 3   Multiple Vitamins-Minerals (CENTRUM SILVER 50+WOMEN) TABS Take 1 tablet by mouth daily.     Multiple Vitamins-Minerals (PRESERVISION AREDS 2 PO) Take 1 tablet by mouth in the morning and at bedtime.     nitroGLYCERIN (NITROSTAT) 0.4 MG SL tablet PLACE ONE TABLET UNDER THE TONGUE EVERY 5 MINUTES AS NEEDED. 25 tablet 12   pantoprazole (PROTONIX) 40 MG tablet Protonix 80 mg twice daily for now 120 tablet 0   Polyethyl Glycol-Propyl Glycol (SYSTANE OP) Place 1 drop into both eyes daily as needed (dry eyes).     sucralfate (CARAFATE) 1 g tablet TAKE 1 TABLET BY MOUTH 4 TIMES DAILY PLEASE  TAKE  MEDICATION  2  HOURS  BEFORE  OR  AFTER  OTHER  MEDICATION 120 tablet 0   No current facility-administered medications on file prior to visit.    Allergies  Allergen Reactions   Diflunisal     REACTION: rash , swelling  anaphlaxsis   Lipitor [Atorvastatin]     Pt had body joint pain   Statins     Myalgia     Tetanus Toxoid     REACTION: arm swelling, fever       Physical Exam Vitals requested from patient and listed below if patient had equipment and was able to obtain at home for this virtual visit: Vitals:   01/23/23 1204  BP: 116/66  Pulse: 65   Estimated body mass index is 22.84 kg/m as calculated from the following:   Height as of this encounter: 5' 7.5" (1.715 m).   Weight as of this encounter: 148 lb (67.1 kg).  EKG (optional): deferred due to virtual visit  GENERAL: alert, oriented, no acute distress detected, full vision exam deferred due to pandemic and/or virtual encounter   PSYCH/NEURO: pleasant and cooperative, no  obvious depression or anxiety, speech and thought processing grossly intact, Cognitive function grossly intact  Flowsheet Row Office Visit from [facility]/23/2023 [facility]  PHQ-9 Total Score 7           01/23/2023   12:08 PM 01/19/2022   12:43 PM 06/15/2021    9:34 AM 01/17/2021    1:28 PM 01/17/2021    1:20 PM  Depression screen PHQ 2/9  Decreased Interest 0 0 1 0 0  Down, Depressed, Hopeless 2 0 0 0 0  PHQ - 2 Score 2 0 1 0 0  Altered sleeping 2  3    Tired, decreased energy 3  3    Change in appetite 0  1    Feeling bad or failure about yourself  0  1    Trouble concentrating 0  0    Moving slowly or fidgety/restless 0  0    Suicidal thoughts 0  0    PHQ-9 Score 7  9    Difficult doing work/chores   Not difficult at all    Has tried therapy with counselor and with hospice group counseling - didn't help. Has been since husband passed in 2015 - still an adjustment living alone. She feels is coping ok. She has a dog. Has tried socializing and dating... but she just misses her husband. He was her best friend. She has 5 grandchildren and a great grandchild.  She talks to them often.      11/01/2020    9:30 AM 01/17/2021    1:43 PM 01/19/2022   12:45 PM 01/19/2023    3:34 PM 01/23/2023   12:07 PM  Fall Risk  Falls in the past year?  0 1 1 1   Was there an injury with Fall?  0 0 1 0  Fall Risk Category Calculator  0 1 3 2   Fall Risk Category (Retired)  Low Low    (RETIRED) Patient Fall Risk Level Moderate fall risk Low fall risk Low fall risk    Patient at Risk for Falls Due to  No Fall Risks No Fall Risks    Fall risk Follow up  Falls evaluation completed Falls prevention discussed  Falls evaluation completed  She is going to do PT.    SUMMARY AND PLAN:  Encounter for Medicare annual wellness exam   Discussed applicable health maintenance/preventive health measures and advised and referred or ordered per patient preferences: -discussed the shingles,  flu, covid and tetanus vaccines - she plans to get them at the pharmacy Health Maintenance  Topic Date Due   Zoster Vaccines- Shingrix (1 of 2) 07/04/1959   COVID-19 Vaccine (5 - 2023-24 season) 12/31/2022   INFLUENZA VACCINE  07/30/2023 (Originally 11/30/2022)   Medicare Annual Wellness (AWV)  01/23/2024   Pneumonia Vaccine 44+ Years old  Completed   DEXA SCAN  Completed   HPV VACCINES  Aged Out   DTaP/Tdap/Td  Discontinued   Hepatitis C Screening  Discontinued    Education and counseling on the following was provided based on the above review of health and a plan/checklist for the patient, along with additional information discussed, was provided for the patient in the patient instructions :  -Provided counseling and plan for increased risk of falling if applicable per above screening. She plans to start PT in October. Provided exercises in pt instructions.  -Advised and counseled on a healthy lifestyle - including the importance of a healthy diet, regular physical activity, social connections and stress management. Declines counseling. Discussed brief meaningful social interactions and community ideas.  -Reviewed patient's current diet. Advised and counseled on a whole foods based healthy diet. A summary of a healthy diet was provided in the Patient Instructions.  -reviewed patient's current physical activity level and discussed exercise guidelines for adults. She is starting PT and agrees will work with them to get more active.  -Advise yearly dental visits at minimum and regular eye exams -Advised and counseled on alcohol safe limits Follow up: see patient instructions     Patient Instructions  I really enjoyed getting to talk with you today! I am available on Tuesdays and Thursdays for virtual visits if you have any questions or concerns, or if I can be of any further assistance.   CHECKLIST FROM ANNUAL WELLNESS VISIT:  -Follow up (please call to schedule if not scheduled after  visit):   -yearly for annual wellness visit with primary care office  Here is a list of your preventive care/health maintenance measures and the plan for each if any are due:  PLAN For any measures below that may be due:  -can get the covid, flu and shingles vaccines at the pharmacy  Health Maintenance  Topic Date Due   Zoster Vaccines- Shingrix (1 of 2) 07/04/1959   COVID-19 Vaccine (5 - 2023-24 season) 12/31/2022   INFLUENZA VACCINE  07/30/2023 (Originally 11/30/2022)   Medicare Annual Wellness (AWV)  01/23/2024   Pneumonia Vaccine  88+ Years old  Completed   DEXA SCAN  Completed   HPV VACCINES  Aged Out   DTaP/Tdap/Td  Discontinued   Hepatitis C Screening  Discontinued    -See a dentist at least yearly  -Get your eyes checked and then per your eye specialist's recommendations  -Other issues addressed today:   -I have included below further information regarding a healthy whole foods based diet, physical activity guidelines for adults, stress management and opportunities for social connections. I hope you find this information useful.   -----------------------------------------------------------------------------------------------------------------------------------------------------------------------------------------------------------------------------------------------------------  NUTRITION: -eat real food: lots of colorful vegetables (half the plate) and fruits -5-7 servings of vegetables and fruits per day (fresh or steamed is best), exp. 2 servings of vegetables with lunch and dinner and 2 servings of fruit per day. Berries and greens such as kale and collards are great choices.  -consume on a regular basis: whole grains (make sure first ingredient on label contains the word "whole"), fresh fruits, fish, nuts, seeds, healthy oils (such as olive oil, avocado oil, grape seed oil) -may eat small amounts of dairy and lean meat on occasion, but avoid processed meats such as ham,  bacon, lunch meat, etc. -drink water -try to avoid fast food and pre-packaged foods, processed meat -most experts advise limiting sodium to < 2300mg  per day, should limit further is any chronic conditions such as high blood pressure, heart disease, diabetes, etc. The American Heart Association advised that < 1500mg  is is ideal -try to avoid foods that contain any ingredients with names you do not recognize  -try to avoid sugar/sweets (except for the natural sugar that occurs in fresh fruit) -try to avoid sweet drinks -try to avoid white rice, white bread, pasta (unless whole grain), white or yellow potatoes  EXERCISE GUIDELINES FOR ADULTS: -if you wish to increase your physical activity, do so gradually and with the approval of your doctor -STOP and seek medical care immediately if you have any chest pain, chest discomfort or trouble breathing when starting or increasing exercise  -move and stretch your body, legs, feet and arms when sitting for long periods -Physical activity guidelines for optimal health in adults: -least 150 minutes per week of aerobic exercise (can talk, but not sing) once approved by your doctor, 20-30 minutes of sustained activity or two 10 minute episodes of sustained activity every day.  -resistance training at least 2 days per week if approved by your doctor -balance exercises 3+ days per week:   Stand somewhere where you have something sturdy to hold onto if you lose balance.    1) lift up on toes, start with 5x per day and work up to 20x   2) stand and lift on leg straight out to the side so that foot is a few inches of the floor, start with 5x each side and work up to 20x each side   3) stand on one foot, start with 5 seconds each side and work up to 20 seconds on each side  If you need ideas or help with getting more active:  -Silver sneakers https://tools.silversneakers.com  -Walk with a Doc: http://www.duncan-williams.com/  -try to include resistance (weight  lifting/strength building) and balance exercises twice per week: or the following link for ideas: http://castillo-powell.com/  BuyDucts.dk  STRESS MANAGEMENT: -can try meditating, or just sitting quietly with deep breathing while intentionally relaxing all parts of your body for 5 minutes daily -if you need further help with stress, anxiety or depression please follow up with your primary  doctor or contact the wonderful folks at [facility]: [phone]  SOCIAL CONNECTIONS: -options in [place] if you wish to engage in more social and exercise related activities:  -Silver sneakers https://tools.silversneakers.com  -Walk with a Doc: http://www.duncan-williams.com/  -Check out the [facility] Active Adults 50+ section on the [employer] (hiking clubs, book clubs, cards and games, chess, exercise classes, aquatic classes and much more) - see the website for details: https://www.[place].gov/departments/parks-recreation/active-adults50  -YouTube has lots of exercise videos for different ages and abilities as well  -[facility] (a variety of indoor and outdoor inperson activities for adults). [phone]. [address]  -Virtual Online Classes (a variety of topics): see seniorplanet.org or call [phone]  -consider volunteering at a school, hospice center, church, senior center or elsewhere           Terressa Koyanagi, DO

## 2023-01-24 ENCOUNTER — Encounter: Payer: Self-pay | Admitting: Neurology

## 2023-01-24 ENCOUNTER — Ambulatory Visit (INDEPENDENT_AMBULATORY_CARE_PROVIDER_SITE_OTHER): Payer: Medicare Other | Admitting: Neurology

## 2023-01-24 VITALS — BP 104/67 | HR 65 | Ht 67.0 in | Wt 187.0 lb

## 2023-01-24 DIAGNOSIS — D500 Iron deficiency anemia secondary to blood loss (chronic): Secondary | ICD-10-CM

## 2023-01-24 DIAGNOSIS — R5383 Other fatigue: Secondary | ICD-10-CM | POA: Diagnosis not present

## 2023-01-24 DIAGNOSIS — G4733 Obstructive sleep apnea (adult) (pediatric): Secondary | ICD-10-CM | POA: Diagnosis not present

## 2023-01-24 NOTE — Patient Instructions (Signed)
It was nice to see you again today. I am glad to hear, things are going well with your autoPAP therapy. You have adjusted well to treatment with your new machine, and you are compliant with it. You have also fulfilled the insurance-mandated compliance percentage, which is reassuring, so you can get ongoing supplies through your insurance. Please talk to your DME provider about getting replacement supplies on a regular basis. Please be sure to change your filter every month, your mask about every 3 months, hose about every 6 months, humidifier chamber about yearly. Some restrictions are imposed by your insurance carrier with regard to how frequently you can get certain supplies.  Your DME company can provide further details if necessary.   Please continue using your autoPAP regularly. While your insurance requires that you use PAP at least 4 hours each night on 70% of the nights, I recommend, that you not skip any nights and use it throughout the night if you can. Getting used to PAP and staying with the treatment long term does take time and patience and discipline. Untreated obstructive sleep apnea when it is moderate to severe can have an adverse impact on cardiovascular health and raise her risk for heart disease, arrhythmias, hypertension, congestive heart failure, stroke and diabetes. Untreated obstructive sleep apnea causes sleep disruption, nonrestorative sleep, and sleep deprivation. This can have an impact on your day to day functioning and cause daytime sleepiness and impairment of cognitive function, memory loss, mood disturbance, and problems focussing. Using PAP regularly can improve these symptoms.  We can see you in 1 year, you can see one of our nurse practitioners as you are stable; you have seen Butch Penny before.

## 2023-01-24 NOTE — Progress Notes (Signed)
Subjective:    Patient ID: Shelley Coleman is a 82 y.o. female.  HPI    Interim history:   Shelley Coleman is an 82 year old female with an underlying medical history of anemia, iron deficiency, restless leg syndrome, lacunar stroke, history of macular degeneration, hyperlipidemia, history of breast cancer, status post surgery and chemoradiation, history of GI bleed, history of optic neuritis, status post lumbar decompression surgery, and mildly overweight state, who presents for follow-up consultation of her obstructive sleep apnea after interim testing and starting home AutoPap therapy.  The patient is unaccompanied today.  I first met her at the request of Dr. Terrace Arabia on 08/23/2022, at which time she reported chronic difficulty falling asleep and staying asleep.  She had daytime tiredness and fatigue.  She was advised to proceed with a sleep study.  She had a home sleep test through our office on 10/25/2022 which showed severe obstructive sleep apnea with an AHI of 45.7/h, O2 nadir 78%.  Snoring was in the mild to moderate range.  She was advised to proceed with home AutoPap therapy.  Her set up date was 11/06/2022.  She has a ResMed air sense 11 AutoSet machine.  Her DME company is [employer].  Today, 01/24/2023: I reviewed her AutoPap compliance data from 12/24/2022 through 01/22/2023, which is a total of 30 days, during which time she used her machine every night with percent use days greater than 4 hours at 87%, indicating very good compliance with an average usage of 6 hours and 0 minutes, residual AHI at goal at 0.4/h, 95th percentile of pressure at 7.5 cm with a range of 5 to 11 cm with EPR of 3.  Leak acceptable with the 95th percentile at 13.4 L/min.  She reports that she is adjusting to her autoPAP quite well. She feels it is helping, but still has fatigue and feels drained. She has low iron and has undergone upper and lower GI endoscopies, had iron infusion in January.  She is followed by [facility] hematology.   Her latest ferritin level this month was 20.  She may need another iron infusion.  As far as her AutoPap, she is using a fullface mask and has been able to tolerate it.  She purchased a CPAP pillow which helped her sleep on her left side which is her preferred side.  She has a history of vertigo and sometimes it gets worse when she sleeps on her right side, she cannot sleep on her back.  She is very motivated to continue with treatment, she does believe it has helped her especially fall asleep a little faster.  Epworth sleepiness score is 5 out of 24, previously was 8 out of 24.  The patient's allergies, current medications, family history, past medical history, past social history, past surgical history and problem list were reviewed and updated as appropriate.   Previously:   08/23/2022: (She) reports chronic difficulty sleeping, including difficulty falling asleep and staying asleep.  She is not sure if she snores.  Her Epworth sleepiness score is 8 out of 24, fatigue severity score is 42 out of 63.  I reviewed your office note from 07/17/2022.  She is widowed, she lives alone.  She has a small dog that sleeps on the bed with her.  She does not have a set schedule for her sleep, she may go to bed somewhere between midnight and 3 AM.  She tries to sleep as much as possible, averages about 4 to 5 hours on any given  night with interruptions.  She has nocturia at least once per average night, denies morning headaches but has had the occasional nocturnal headache for which she has taken Tylenol.  She is followed by hematology and GI for her GI bleed, she was found to have duodenal ulcers and gastritis as well as esophagitis.  She has a hiatal hernia.  She has 1 grown son and 1 grown daughter.  She has restless leg symptoms and her gabapentin was increased to 900 mg at bedtime.  She is a side sleeper.  She has had iron infusions and even blood transfusion.  She has seen GI at [facility].  She has had fatigue for about a  year.  She is a non-smoker and drinks alcohol less than once a week.  She drinks caffeine in the form of coffee, about 2 cups in the mornings.  She does not have a TV in her bedroom.  Her Past Medical History Is Significant For: Past Medical History:  Diagnosis Date   Anemia    Has had iron infusions   Breast cancer (HCC)    right   Cerebrovascular disease 11/12/2018   Depression    Depression    GERD (gastroesophageal reflux disease)    Hiatal hernia    History of GI bleed    Hx of mitral valve prolapse    Hyperlipidemia    Irregular heart beat    Neuropathy    numbness in both feet   Nocturnal leg cramps 09/02/2015   Optic neuritis    left   Osteopenia    Ovarian cyst    Parotid tumor    right   Pelvic fracture (HCC)    Restless legs syndrome    Shingles 2000   Left in V1   Stroke (HCC)    Syncopal episodes 06/28/2022    Her Past Surgical History Is Significant For: Past Surgical History:  Procedure Laterality Date   ABDOMINAL HYSTERECTOMY     APPENDECTOMY     BREAST LUMPECTOMY Left    CATARACT EXTRACTION     ESOPHAGEAL MANOMETRY N/A 01/13/2013   Procedure: ESOPHAGEAL MANOMETRY (EM);  Surgeon: Mardella Layman, MD;  Location: [facility] ENDOSCOPY;  Service: Endoscopy;  Laterality: N/A;   ESOPHAGOGASTRODUODENOSCOPY (EGD) WITH PROPOFOL N/A 10/30/2020   Procedure: ESOPHAGOGASTRODUODENOSCOPY (EGD) WITH PROPOFOL;  Surgeon: Napoleon Form, MD;  Location: [facility] ENDOSCOPY;  Service: Endoscopy;  Laterality: N/A;   FOREIGN BODY REMOVAL  10/30/2020   Procedure: FOREIGN BODY REMOVAL;  Surgeon: Napoleon Form, MD;  Location: [facility] ENDOSCOPY;  Service: Endoscopy;;   HERNIA REPAIR  08/2009   HOT HEMOSTASIS N/A 10/30/2020   Procedure: HOT HEMOSTASIS (ARGON PLASMA COAGULATION/BICAP);  Surgeon: Napoleon Form, MD;  Location: [facility] ENDOSCOPY;  Service: Endoscopy;  Laterality: N/A;   MASTECTOMY Right    SPINE SURGERY     TOTAL HIP ARTHROPLASTY Left     Her Family History Is Significant  For: Family History  Problem Relation Age of Onset   Cancer Mother        Angio sarcoma    Heart disease Father    Hypertension Father    Stroke Father        x2   Heart attack Father        x 11   Hernia Brother    Hypertension Brother    Neuropathy Neg Hx    Restless legs syndrome Neg Hx    Colon cancer Neg Hx    Esophageal cancer Neg Hx  Pancreatic cancer Neg Hx    Stomach cancer Neg Hx    Rectal cancer Neg Hx    Sleep apnea Neg Hx     Her Social History Is Significant For: Social History   Socioeconomic History   Marital status: Widowed    Spouse name: Not on file   Number of children: 2   Years of education: 22   Highest education level: Not on file  Occupational History   Occupation: Retired Charity fundraiser  Tobacco Use   Smoking status: Never   Smokeless tobacco: Never  Vaping Use   Vaping status: Never Used  Substance and Sexual Activity   Alcohol use: Yes    Alcohol/week: 1.0 standard drink of alcohol    Types: 1 Glasses of wine per week    Comment: Rarely,socially   Drug use: No   Sexual activity: Not on file  Other Topics Concern   Not on file  Social History Narrative   Retired from nursing    Widowed    Left-handed      Social Determinants of Health   Financial Resource Strain: Low Risk  (01/19/2022)   Overall Financial Resource Strain (CARDIA)    Difficulty of Paying Living Expenses: Not hard at all  Food Insecurity: No Food Insecurity (01/19/2022)   Hunger Vital Sign    Worried About Running Out of Food in the Last Year: Never true    Ran Out of Food in the Last Year: Never true  Transportation Needs: No Transportation Needs (01/19/2022)   PRAPARE - Administrator, Civil Service (Medical): No    Lack of Transportation (Non-Medical): No  Physical Activity: Inactive (01/19/2022)   Exercise Vital Sign    Days of Exercise per Week: 0 days    Minutes of Exercise per Session: 0 min  Stress: No Stress Concern Present (01/19/2022)   [employer] of Occupational Health - Occupational Stress Questionnaire    Feeling of Stress : Not at all  Social Connections: Moderately Integrated (01/19/2022)   Social Connection and Isolation Panel [NHANES]    Frequency of Communication with Friends and Family: More than three times a week    Frequency of Social Gatherings with Friends and Family: More than three times a week    Attends Religious Services: More than 4 times per year    Active Member of [employer] or Organizations: Yes    Attends Banker Meetings: More than 4 times per year    Marital Status: Widowed    Her Allergies Are:  Allergies  Allergen Reactions   Diflunisal     REACTION: rash , swelling  anaphlaxsis   Lipitor [Atorvastatin]     Pt had body joint pain   Statins     Myalgia     Tetanus Toxoid     REACTION: arm swelling, fever  :   Her Current Medications Are:  Outpatient Encounter Medications as of 01/24/2023  Medication Sig   acetaminophen (TYLENOL) 500 MG tablet Take 1,000 mg by mouth every 6 (six) hours as needed for moderate pain or headache.   ALPRAZolam (XANAX) 1 MG tablet TAKE 1/2 TO 1 (ONE-HALF TO ONE) TABLET BY MOUTH TWICE DAILY AS NEEDED FOR ANXIETY   atenolol (TENORMIN) 50 MG tablet Take 1 tablet by mouth once daily   clopidogrel (PLAVIX) 75 MG tablet Take 1 tablet by mouth once daily   ferrous sulfate 325 (65 FE) MG tablet Take by mouth.   gabapentin (NEURONTIN) 300 MG capsule Take  3 capsules (900 mg total) by mouth at bedtime.   Multiple Vitamins-Minerals (CENTRUM SILVER 50+WOMEN) TABS Take 1 tablet by mouth daily.   Multiple Vitamins-Minerals (PRESERVISION AREDS 2 PO) Take 1 tablet by mouth in the morning and at bedtime.   nitroGLYCERIN (NITROSTAT) 0.4 MG SL tablet PLACE ONE TABLET UNDER THE TONGUE EVERY 5 MINUTES AS NEEDED.   pantoprazole (PROTONIX) 40 MG tablet Protonix 80 mg twice daily for now   Polyethyl Glycol-Propyl Glycol (SYSTANE OP) Place 1 drop into both eyes daily as needed  (dry eyes).   sucralfate (CARAFATE) 1 g tablet TAKE 1 TABLET BY MOUTH 4 TIMES DAILY PLEASE  TAKE  MEDICATION  2  HOURS  BEFORE  OR  AFTER  OTHER  MEDICATION   UNABLE TO FIND Med Name: Avastin  left eye injection   bevacizumab (AVASTIN) 400 MG/16ML SOLN Inject 400 mg into the vein once.   No facility-administered encounter medications on file as of 01/24/2023.  :  Review of Systems:  Out of a complete 14 point review of systems, all are reviewed and negative with the exception of these symptoms as listed below:  Review of Systems  Neurological:        Pt here for CPAP f/u Pt states no questions or concerns for todays visit      Objective:  Neurological Exam  Physical Exam Physical Examination:   Vitals:   01/24/23 0953  BP: 104/67  Pulse: 65    General Examination: The patient is a very pleasant 82 y.o. female in no acute distress. She appears well-developed and well-nourished and well groomed.   HEENT: Normocephalic, atraumatic, pupils are equal, round and reactive to light, extraocular tracking is good without limitation to gaze excursion or nystagmus noted. Hearing is grossly intact. Face is symmetric with normal facial animation. Speech is clear with no dysarthria noted. There is no hypophonia. There is no lip, neck/head, jaw or voice tremor. Neck is supple with full range of passive and active motion. There are no carotid bruits on auscultation. Oropharynx exam reveals: mild mouth dryness, adequate dental hygiene and mild airway crowding.  Tongue protrudes centrally and palate elevates symmetrically.   Chest: Clear to auscultation without wheezing, rhonchi or crackles noted.   Heart: S1+S2+0, regular with a systolic murmur.      Abdomen: Soft, non-tender and non-distended.   Extremities: There is no pitting edema in the distal lower extremities bilaterally.    Skin: Warm and dry without trophic changes noted.    Musculoskeletal: exam reveals no obvious joint deformities.     Neurologically:  Mental status: The patient is awake, alert and oriented in all 4 spheres. Her immediate and remote memory, attention, language skills and fund of knowledge are appropriate. There is no evidence of aphasia, agnosia, apraxia or anomia. Speech is clear with normal prosody and enunciation. Thought process is linear. Mood is normal and affect is normal.  Cranial nerves II - XII are as described above under HEENT exam.  Motor exam: Normal bulk, strength and tone is noted. There is no obvious action or resting tremor.  Fine motor skills and coordination: grossly intact.  Cerebellar testing: No dysmetria or intention tremor. There is no truncal or gait ataxia.  Sensory exam: intact to light touch in the upper and lower extremities.  Gait, station and balance: She stands up slowly, slightly stooped posture, walks a little slowly, no shuffling, preserved arm swing.     Assessment and Plan:  In summary, ISABELE FAM is  an 82 year old female with an underlying medical history of anemia, iron deficiency, restless leg syndrome, lacunar stroke, history of macular degeneration, hyperlipidemia, history of breast cancer, status post surgery and chemoradiation, history of GI bleed, history of optic neuritis, status post lumbar decompression surgery, and mildly overweight state, who presents for follow-up consultation of her obstructive sleep apnea after interim testing and starting home AutoPap therapy.  She had a home sleep test through our office on 10/25/2022 which showed severe obstructive sleep apnea with an AHI of 45.7/h, O2 nadir 78%.  Snoring was in the mild to moderate range.  She has been on home AutoPap therapy since 11/06/2022.  She has a ResMed air sense 11 AutoSet machine.  Her DME company is [employer].  She is using a fullface mask.  She is compliant with treatment and has benefited from it, apnea control is excellent.  Average usage is right at 6 hours, could be a little bit more with  time but she is adjusting still to treatment, has not even completed the first 3 months of therapy.  She is very motivated to continue with treatment, we talked about her home sleep test results and reviewed her compliance data in detail.  She is struggling with fatigue, likely owing to her iron deficiency anemia and chronic blood loss, she is in follow-up with GI and with hematology.  She is advised to continue with her AutoPap at the current settings and highly commended for her treatment adherence.  At this juncture, she is advised to follow-up in sleep clinic to see one of our nurse practitioners routinely in 1 year.  I answered all her questions today and she was in agreement. I spent 30 minutes in total face-to-face time and in reviewing records during pre-charting, more than 50% of which was spent in counseling and coordination of care, reviewing test results, reviewing medications and treatment regimen and/or in discussing or reviewing the diagnosis of OSA, fatigue, anemia, the prognosis and treatment options. Pertinent laboratory and imaging test results that were available during this visit with the patient were reviewed by me and considered in my medical decision making (see chart for details).

## 2023-01-25 DIAGNOSIS — D500 Iron deficiency anemia secondary to blood loss (chronic): Secondary | ICD-10-CM | POA: Diagnosis not present

## 2023-01-25 DIAGNOSIS — E538 Deficiency of other specified B group vitamins: Secondary | ICD-10-CM | POA: Diagnosis not present

## 2023-01-30 NOTE — Progress Notes (Unsigned)
Patient: Shelley Coleman Date of Birth: [birth year]  Reason for Visit: Follow up History from: Patient Primary Neurologist: Terrace Arabia  ASSESSMENT AND PLAN 82 y.o. year old female   1.  Restless leg syndrome 2.  Excessive fatigue, lack of stamina 3.  Gait abnormality 4.  History of GI bleeding, chronic iron deficiency anemia -EMG showed no evidence of large fiber peripheral neuropathy.  There was evidence of chronic right lumbosacral radiculopathy. -HST June 2024 showed severe OSA, has been doing well on CPAP -MRI cervical spine showed degenerative changes, worst at C5-C6, C6/C7 mild spinal stenosis, moderately severe left foraminal narrowing with potential for C6 and C7 nerve root compression -Having iron infusion coming up, improves energy, also on B12. Keep follow up with hematology  -Continue the gabapentin up to 300 mg 3 capsules at bedtime for RLS -Continue CPAP nights use -Can keep appointment with Aundra Millet in September for CPAP, can consolidate visits    Meds ordered this encounter  Medications   gabapentin (NEURONTIN) 300 MG capsule    Sig: Take 3 capsules (900 mg total) by mouth at bedtime.    Dispense:  270 capsule    Refill:  3     HISTORY  Shelley Coleman is a 82 year old female, previous patient of Dr. Anne Hahn, primary care is nurse practitioner Evelene Croon, Kandee Keen,   I reviewed and summarized the referring note. PMHX Chronic insomnia Macular degeneration HLD Right Breast Cancer, s/p lobectomy more than 20 years ago, chemo/radiation therapy GI bleeding,  Left optic neuritis at age 43s. Stroke  Lumbar decompression surgery in past.   She was seen by Dr. Anne Hahn before 2014, carried a diagnosis of restless leg syndrome, chronic severe insomnia, low ferritin level, previously has required IV iron dextran on, which does help her symptoms   Today, her main concern is overwhelming fatigue, lack of stamina, this has been ongoing since 2020, seen by different specialist, including  GI, cardiologist   EGD on March 17, 2022 by GI physician Dr. Ileene Patrick, 6 cm hiatal hernia, grade B esophagitis, stomach exam was normal, nonbleeding superficial small duodenal ulcer erosion, diverticulum was found in the second portion of duodenum,   Cardiology work up in Sept 2023, chart reviewed, coronary artery disease, with borderline significant stenosis in the [facility], her fatigue with exertion may be stable angina, suggest sublingual nitroglycerin as needed, hypertension that was well-controlled, history of CVA, hyperlipidemia, could not tolerate statin, myalgia, whole family have similar issue, received Leqvi infusion February 16, 2022,   She also reported history of left optic neuritis, many years ago, recovering well, COVID the diagnosis of macular degeneration now, receiving avastin injection periodically   She complains of overwhelming generalized fatigue, has not been able to bring back her trash can, it is too much for her to go to her driveway, and bring it back, she lives alone, still driving, but her children check on her frequently   She complains of many years history of intermittent double vision,   Laboratory evaluations 2023, hemoglobin of 11.7, LDL 67, gastrin level was 45 within normal limit, ferritin level was only 6.3, TIBC was elevated at 455   UPDATE July 17 2022: Continue complains of excessive fatigue, not feeling well, burning, bloating sensation, uncomfortable from throat to pelvic region, black stool, stool occult blood was positive x 3, pending GI EGD evaluation, taking Plavix with reported history of stroke, Personally reviewed MRI of the brain from December 2018, chronic lacunar infarction in the head of left caudate, and  internal capsule, moderate small vessel disease involving pons, thalamus, subcortical white matter   She was seen by [facility] on July 12 2022, with diagnosis of iron deficiency anemia, B12 was also low,   She had infusion  early end of January 2024, not improvement of her stamina, reported 1 episode of syncope few weeks ago, stepping out of the car, taking her little dog for groomer, then passed out besides her car,   I reviewed hospital discharge in July 2022, she presented to the hospital complains of shortness of breath, weakness, found to have upper GI bleeding, with acute blood loss anemia, Hg 7.8,  incidental COVID-19 infection, required blood transfusion, EGD on July 2 showed Cameron lesion, AVMs-s/p APC, GI recommended continue PPI for 3 months, orthostatic dizziness within due to orthostatic hypotension, was managed with IV fluid, which has helped her symptoms,   Laboratory evaluation on July 12, 2022 from [facility], hemoglobin of 12.1, ferritin was 125, folic acid 21.2, B12 293, stool test was positive for occult bloodx3,    Extensive laboratory evaluation December 2023, hemoglobin of 11.7, normal TSH, B12 was within normal limit, 614, TSH, C-reactive protein, CPK, ESR, ANA, protein electrophoresis, A1c, autoimmune neurology laboratory evaluation, ER, acetylcholine receptor antibody, ANCA profile showed positive anti-PR-3 antibody,   Blood count dropped to 10.5 in January, ferritin was only 6, B12 dropped to 358, blood transfusion on May 19, 2022,   Her restless leg symptoms is under much better control with higher dose of gabapentin, tolerating it well, but sometimes to take as needed Xanax for her to sleep, complains of poor sleep quality, often wake up in the morning unrefreshed, she lives alone, was not sure if she is snoring, complains of excessive daytime fatigue, sleepiness too   UPDATE May 15th 2024: She return for electrodiagnostic study today, showed no evidence of large fiber peripheral neuropathy, evidence of chronic right lumbosacral radiculopathy   She continue have intermittent tarry stool, last follow-up with GI physician was in February,   Her restless leg symptoms overall has much  improved with higher dose of gabapentin 900 mg daily, was seen by Dr. Frances Furbish on schedule for sleep study   She complains of worsening gait abnormality, occasionally bowel and bladder incontinence, stiff gait, today's examination showed brisk bilateral upper extremity, left lower extremity reflexes bilateral Babinski signs, concern for cervical spondylitic myelopathy, history of right lumbar radiculopathy required decompression surgery in the past   Personally reviewed MRI of lumbar spine January 2023, posterior decompression fusion L4-5 with metal hardware, multilevel degenerative disease  Update January 31, 2023 SS: has iron infusion scheduled, continues with fatigue. Walking to her mailbox, bringing her trash cans back in feels short of breath.  Follows with hematology, with infusion provides a burst of energy. Is on B12 injections. Takes gabapentin 900 mg at bedtime, RLS under great control, 1st time in years. Is faithful to use her CPAP, has not been a dramatic change, started in July 2024. No falls, but can be unsteady, more 1st thing in the AM. Lives alone, her kids are close by. Left hip pain the last month, starting PT.   REVIEW OF SYSTEMS: Out of a complete 14 system review of symptoms, the patient complains only of the following symptoms, and all other reviewed systems are negative.  See HPI  ALLERGIES: Allergies  Allergen Reactions   Diflunisal     REACTION: rash , swelling  anaphlaxsis   Lipitor [Atorvastatin]     Pt had body joint pain  Statins     Myalgia     Tetanus Antitoxin     Other Reaction(s): Not available   Tetanus Toxoid     REACTION: arm swelling, fever    HOME MEDICATIONS: Outpatient Medications Prior to Visit  Medication Sig Dispense Refill   acetaminophen (TYLENOL) 500 MG tablet Take 1,000 mg by mouth every 6 (six) hours as needed for moderate pain or headache.     ALPRAZolam (XANAX) 1 MG tablet TAKE 1/2 TO 1 (ONE-HALF TO ONE) TABLET BY MOUTH TWICE DAILY AS  NEEDED FOR ANXIETY 30 tablet 1   atenolol (TENORMIN) 50 MG tablet Take 1 tablet by mouth once daily 90 tablet 0   clopidogrel (PLAVIX) 75 MG tablet Take 1 tablet by mouth once daily 90 tablet 0   ferrous sulfate 325 (65 FE) MG tablet Take by mouth.     gabapentin (NEURONTIN) 300 MG capsule Take 3 capsules (900 mg total) by mouth at bedtime. 270 capsule 3   Inclisiran Sodium (LEQVIO Sells) Inject 1 mL into the skin every 6 (six) months.     Multiple Vitamins-Minerals (CENTRUM SILVER 50+WOMEN) TABS Take 1 tablet by mouth daily.     Multiple Vitamins-Minerals (PRESERVISION AREDS 2 PO) Take 1 tablet by mouth in the morning and at bedtime.     nitroGLYCERIN (NITROSTAT) 0.4 MG SL tablet PLACE ONE TABLET UNDER THE TONGUE EVERY 5 MINUTES AS NEEDED. 25 tablet 12   pantoprazole (PROTONIX) 40 MG tablet Protonix 80 mg twice daily for now 120 tablet 0   Polyethyl Glycol-Propyl Glycol (SYSTANE OP) Place 1 drop into both eyes daily as needed (dry eyes).     sucralfate (CARAFATE) 1 g tablet TAKE 1 TABLET BY MOUTH 4 TIMES DAILY PLEASE  TAKE  MEDICATION  2  HOURS  BEFORE  OR  AFTER  OTHER  MEDICATION 120 tablet 0   UNABLE TO FIND Med Name: Avastin  left eye injection     bevacizumab (AVASTIN) 400 MG/16ML SOLN Inject 400 mg into the vein once.     No facility-administered medications prior to visit.    PAST MEDICAL HISTORY: Past Medical History:  Diagnosis Date   Anemia    Has had iron infusions   Breast cancer (HCC)    right   Cerebrovascular disease 11/12/2018   Depression    Depression    GERD (gastroesophageal reflux disease)    Hiatal hernia    History of GI bleed    Hx of mitral valve prolapse    Hyperlipidemia    Irregular heart beat    Neuropathy    numbness in both feet   Nocturnal leg cramps 09/02/2015   Optic neuritis    left   Osteopenia    Ovarian cyst    Parotid tumor    right   Pelvic fracture (HCC)    Restless legs syndrome    Shingles 2000   Left in V1   Stroke (HCC)     Syncopal episodes 06/28/2022    PAST SURGICAL HISTORY: Past Surgical History:  Procedure Laterality Date   ABDOMINAL HYSTERECTOMY     APPENDECTOMY     BREAST LUMPECTOMY Left    CATARACT EXTRACTION     ESOPHAGEAL MANOMETRY N/A 01/13/2013   Procedure: ESOPHAGEAL MANOMETRY (EM);  Surgeon: Mardella Layman, MD;  Location: [facility] ENDOSCOPY;  Service: Endoscopy;  Laterality: N/A;   ESOPHAGOGASTRODUODENOSCOPY (EGD) WITH PROPOFOL N/A 10/30/2020   Procedure: ESOPHAGOGASTRODUODENOSCOPY (EGD) WITH PROPOFOL;  Surgeon: Napoleon Form, MD;  Location: [facility] ENDOSCOPY;  Service: Endoscopy;  Laterality: N/A;   FOREIGN BODY REMOVAL  10/30/2020   Procedure: FOREIGN BODY REMOVAL;  Surgeon: Napoleon Form, MD;  Location: [facility] ENDOSCOPY;  Service: Endoscopy;;   HERNIA REPAIR  08/2009   HOT HEMOSTASIS N/A 10/30/2020   Procedure: HOT HEMOSTASIS (ARGON PLASMA COAGULATION/BICAP);  Surgeon: Napoleon Form, MD;  Location: [facility] ENDOSCOPY;  Service: Endoscopy;  Laterality: N/A;   MASTECTOMY Right    SPINE SURGERY     TOTAL HIP ARTHROPLASTY Left     FAMILY HISTORY: Family History  Problem Relation Age of Onset   Cancer Mother        Angio sarcoma    Heart disease Father    Hypertension Father    Stroke Father        x2   Heart attack Father        x 11   Hernia Brother    Hypertension Brother    Neuropathy Neg Hx    Restless legs syndrome Neg Hx    Colon cancer Neg Hx    Esophageal cancer Neg Hx    Pancreatic cancer Neg Hx    Stomach cancer Neg Hx    Rectal cancer Neg Hx    Sleep apnea Neg Hx     SOCIAL HISTORY: Social History   Socioeconomic History   Marital status: Widowed    Spouse name: Not on file   Number of children: 2   Years of education: 77   Highest education level: Not on file  Occupational History   Occupation: Retired Charity fundraiser  Tobacco Use   Smoking status: Never   Smokeless tobacco: Never  Vaping Use   Vaping status: Never Used  Substance and Sexual Activity   Alcohol use:  Yes    Alcohol/week: 1.0 standard drink of alcohol    Types: 1 Glasses of wine per week    Comment: Rarely,socially   Drug use: No   Sexual activity: Not on file  Other Topics Concern   Not on file  Social History Narrative   Retired from nursing    Widowed    Left-handed      Social Determinants of Health   Financial Resource Strain: Low Risk  (01/19/2022)   Overall Financial Resource Strain (CARDIA)    Difficulty of Paying Living Expenses: Not hard at all  Food Insecurity: No Food Insecurity (01/19/2022)   Hunger Vital Sign    Worried About Running Out of Food in the Last Year: Never true    Ran Out of Food in the Last Year: Never true  Transportation Needs: No Transportation Needs (01/19/2022)   PRAPARE - Administrator, Civil Service (Medical): No    Lack of Transportation (Non-Medical): No  Physical Activity: Inactive (01/19/2022)   Exercise Vital Sign    Days of Exercise per Week: 0 days    Minutes of Exercise per Session: 0 min  Stress: No Stress Concern Present (01/19/2022)   [employer] of Occupational Health - Occupational Stress Questionnaire    Feeling of Stress : Not at all  Social Connections: Moderately Integrated (01/19/2022)   Social Connection and Isolation Panel [NHANES]    Frequency of Communication with Friends and Family: More than three times a week    Frequency of Social Gatherings with Friends and Family: More than three times a week    Attends Religious Services: More than 4 times per year    Active Member of [employer] or Organizations: Yes    Attends Club or  Organization Meetings: More than 4 times per year    Marital Status: Widowed  Intimate Partner Violence: Not At Risk (01/19/2022)   Humiliation, Afraid, Rape, and Kick questionnaire    Fear of Current or Ex-Partner: No    Emotionally Abused: No    Physically Abused: No    Sexually Abused: No    PHYSICAL EXAM  Vitals:   01/31/23 1252  BP: 105/61  Pulse: 75  Weight: 188 lb 6.4  oz (85.5 kg)  Height: 5' 7.5" (1.715 m)   Body mass index is 29.07 kg/m.  Generalized: Well developed, in no acute distress, tired appearing Neurological examination  Mentation: Alert oriented to time, place, history taking. Follows all commands speech and language fluent Cranial nerve II-XII: Pupils were equal round reactive to light. Extraocular movements were full, visual field were full on confrontational test. Facial sensation and strength were normal.  Head turning and shoulder shrug  were normal and symmetric. Motor: Good strength overall, left hip flexion 4/5 Sensory: Length dependent sensory deficit to mid shin bilaterally  Coordination: Cerebellar testing reveals good finger-nose-finger and heel-to-shin bilaterally.  Gait and station: Gait is cautious, steps tend to over cross, somewhat unsteady, antalgic on the left due to hip  Reflexes: Deep tendon reflexes are symmetric and normal   DIAGNOSTIC DATA (LABS, IMAGING, TESTING) - I reviewed patient records, labs, notes, testing and imaging myself where available.  Lab Results  Component Value Date   WBC 6.0 06/30/2022   HGB 12.4 06/30/2022   HCT 38.3 06/30/2022   MCV 84.3 06/30/2022   PLT 278.0 06/30/2022      Component Value Date/Time   NA 141 06/30/2022 1007   K 4.3 06/30/2022 1007   CL 102 06/30/2022 1007   CO2 33 (H) 06/30/2022 1007   GLUCOSE 91 06/30/2022 1007   BUN 15 06/30/2022 1007   CREATININE 0.69 06/30/2022 1007   CREATININE 0.77 04/28/2016 1552   CALCIUM 10.2 06/30/2022 1007   PROT 6.3 04/25/2022 1451   ALBUMIN 4.0 03/29/2022 1045   AST 18 03/29/2022 1045   ALT 15 03/29/2022 1045   ALKPHOS 84 03/29/2022 1045   BILITOT 0.3 03/29/2022 1045   GFRNONAA >60 10/31/2020 0205   GFRAA >60 11/02/2015 0524   Lab Results  Component Value Date   CHOL 146 03/29/2022   HDL 58.20 03/29/2022   LDLCALC 74 01/30/2022   LDLDIRECT 67.0 03/29/2022   TRIG 228.0 (H) 03/29/2022   CHOLHDL 3 03/29/2022   Lab Results   Component Value Date   HGBA1C 5.2 04/25/2022   Lab Results  Component Value Date   VITAMINB12 614 04/25/2022   Lab Results  Component Value Date   TSH 1.370 04/25/2022    Margie Ege, AGNP-C, DNP 01/31/2023, 1:34 PM [facility] [address], [address] [place], [place] [address] [phone]

## 2023-01-31 ENCOUNTER — Ambulatory Visit (INDEPENDENT_AMBULATORY_CARE_PROVIDER_SITE_OTHER): Payer: Medicare Other | Admitting: Neurology

## 2023-01-31 ENCOUNTER — Encounter: Payer: Self-pay | Admitting: Neurology

## 2023-01-31 VITALS — BP 105/61 | HR 75 | Ht 67.5 in | Wt 188.4 lb

## 2023-01-31 DIAGNOSIS — I679 Cerebrovascular disease, unspecified: Secondary | ICD-10-CM | POA: Diagnosis not present

## 2023-01-31 DIAGNOSIS — G609 Hereditary and idiopathic neuropathy, unspecified: Secondary | ICD-10-CM

## 2023-01-31 DIAGNOSIS — D500 Iron deficiency anemia secondary to blood loss (chronic): Secondary | ICD-10-CM

## 2023-01-31 DIAGNOSIS — G4762 Sleep related leg cramps: Secondary | ICD-10-CM | POA: Diagnosis not present

## 2023-01-31 DIAGNOSIS — G2581 Restless legs syndrome: Secondary | ICD-10-CM

## 2023-01-31 DIAGNOSIS — R269 Unspecified abnormalities of gait and mobility: Secondary | ICD-10-CM | POA: Diagnosis not present

## 2023-01-31 MED ORDER — GABAPENTIN 300 MG PO CAPS: 900.0000 mg | ORAL_CAPSULE | Freq: Every day | ORAL | 3 refills | Status: DC

## 2023-01-31 NOTE — Patient Instructions (Signed)
Great to meet you today.  I will refill your gabapentin.  Please continue close follow-up with hematology.  I hope your iron infusion goes well.  Continue nightly CPAP usage.  We will follow-up in September with Megan.  Please let us know if you need [place] sooner.  Thanks!!

## 2023-02-01 DIAGNOSIS — R269 Unspecified abnormalities of gait and mobility: Secondary | ICD-10-CM | POA: Diagnosis not present

## 2023-02-01 DIAGNOSIS — M25552 Pain in left hip: Secondary | ICD-10-CM | POA: Diagnosis not present

## 2023-02-05 DIAGNOSIS — R269 Unspecified abnormalities of gait and mobility: Secondary | ICD-10-CM | POA: Diagnosis not present

## 2023-02-05 DIAGNOSIS — M25552 Pain in left hip: Secondary | ICD-10-CM | POA: Diagnosis not present

## 2023-02-06 ENCOUNTER — Other Ambulatory Visit: Payer: Self-pay | Admitting: Gastroenterology

## 2023-02-06 DIAGNOSIS — K269 Duodenal ulcer, unspecified as acute or chronic, without hemorrhage or perforation: Secondary | ICD-10-CM

## 2023-02-08 ENCOUNTER — Other Ambulatory Visit: Payer: Self-pay | Admitting: Gastroenterology

## 2023-02-08 ENCOUNTER — Encounter: Payer: Self-pay | Admitting: Neurology

## 2023-02-08 ENCOUNTER — Telehealth: Payer: Self-pay | Admitting: Gastroenterology

## 2023-02-08 DIAGNOSIS — K269 Duodenal ulcer, unspecified as acute or chronic, without hemorrhage or perforation: Secondary | ICD-10-CM

## 2023-02-08 MED ORDER — VOQUEZNA 20 MG PO TABS: 1.0000 | ORAL_TABLET | Freq: Every day | ORAL | 1 refills | Status: DC

## 2023-02-08 NOTE — Telephone Encounter (Signed)
Inbound call from patient, requesting refill for pantoprazole. Please advise.

## 2023-02-08 NOTE — Telephone Encounter (Signed)
Patient states she has been out of pantoprazole for 3 days. Patient reports she needs a refill. Informed patient she was prescribed Voquezna several months ago by Dr. Adela Lank to replace pantoprazole. Patient states she does not remember ever picking up that prescription. Informed patient I will send the medication again to [facility] and if she has any issues picking it up to let us know. Also, do not take pantoprazole and Voquezna together. Patient verbalized understanding.

## 2023-02-14 DIAGNOSIS — R269 Unspecified abnormalities of gait and mobility: Secondary | ICD-10-CM | POA: Diagnosis not present

## 2023-02-14 DIAGNOSIS — M25552 Pain in left hip: Secondary | ICD-10-CM | POA: Diagnosis not present

## 2023-02-15 DIAGNOSIS — M7062 Trochanteric bursitis, left hip: Secondary | ICD-10-CM | POA: Diagnosis not present

## 2023-02-16 DIAGNOSIS — D500 Iron deficiency anemia secondary to blood loss (chronic): Secondary | ICD-10-CM | POA: Diagnosis not present

## 2023-03-01 DIAGNOSIS — Z96642 Presence of left artificial hip joint: Secondary | ICD-10-CM | POA: Diagnosis not present

## 2023-03-01 DIAGNOSIS — M7062 Trochanteric bursitis, left hip: Secondary | ICD-10-CM | POA: Diagnosis not present

## 2023-03-15 DIAGNOSIS — H353112 Nonexudative age-related macular degeneration, right eye, intermediate dry stage: Secondary | ICD-10-CM | POA: Diagnosis not present

## 2023-03-15 DIAGNOSIS — H43813 Vitreous degeneration, bilateral: Secondary | ICD-10-CM | POA: Diagnosis not present

## 2023-03-15 DIAGNOSIS — H353221 Exudative age-related macular degeneration, left eye, with active choroidal neovascularization: Secondary | ICD-10-CM | POA: Diagnosis not present

## 2023-03-15 DIAGNOSIS — Z961 Presence of intraocular lens: Secondary | ICD-10-CM | POA: Diagnosis not present

## 2023-03-19 DIAGNOSIS — M7062 Trochanteric bursitis, left hip: Secondary | ICD-10-CM | POA: Diagnosis not present

## 2023-03-22 ENCOUNTER — Telehealth: Payer: Self-pay | Admitting: Neurology

## 2023-03-22 NOTE — Telephone Encounter (Signed)
Spoke to DMD office and relayed notes from Maralyn Sago and Kandee Keen, gave his office phone number and Fax to fill out paper clearance form. Will forward to both providers so they are aware.  She states that the patient called and had self medicated by stopping her plavix 2 days ago so she could try to have it out today. Receptionist was going to reach out to Cory's practice to relay needed info and fax clearance form.

## 2023-03-22 NOTE — Telephone Encounter (Addendum)
Looks like PCP has been refilling Plavix for the last few years.  However, Dr. Anne Hahn placed her on it in 2020 due to aspirin GI side effect.    She has history of cerebrovascular disease and prior stroke.    I recently saw her in October for RLS, OSA, excessive fatigue.  As long as she has not had any stroke in the last 6 months reasonable to hold Plavix 3 to 5 days (or as requested) for procedure with small but acceptable risk of recurrent stroke while off therapy.  Recommend restarting immediately after or once hemodynamically stable.  Will route to PCP as well since has been coming from them. Thanks

## 2023-03-22 NOTE — Telephone Encounter (Signed)
Noted  

## 2023-03-22 NOTE — Telephone Encounter (Signed)
The office of Dr. Gloris Manchester, DMD is asking for a call to discuss pt being in need of an extraction, they want to discuss pt coming off of her blood thinner for a couple of days.  They are saying pt has already come off of the blood thinner for 2 days due to her wanting the extraction done so bad, please call and ask for [facility].  Pt is wanting to be seen there today at 2.  Phone rep explained this would be sent as high priority

## 2023-03-22 NOTE — Telephone Encounter (Signed)
Form found and faxed by front office staff.

## 2023-04-03 DIAGNOSIS — L821 Other seborrheic keratosis: Secondary | ICD-10-CM | POA: Diagnosis not present

## 2023-04-03 DIAGNOSIS — Z85828 Personal history of other malignant neoplasm of skin: Secondary | ICD-10-CM | POA: Diagnosis not present

## 2023-04-10 DIAGNOSIS — M7062 Trochanteric bursitis, left hip: Secondary | ICD-10-CM | POA: Diagnosis not present

## 2023-04-11 DIAGNOSIS — E538 Deficiency of other specified B group vitamins: Secondary | ICD-10-CM | POA: Diagnosis not present

## 2023-04-11 DIAGNOSIS — D500 Iron deficiency anemia secondary to blood loss (chronic): Secondary | ICD-10-CM | POA: Diagnosis not present

## 2023-04-12 DIAGNOSIS — M7062 Trochanteric bursitis, left hip: Secondary | ICD-10-CM | POA: Diagnosis not present

## 2023-04-17 DIAGNOSIS — M25552 Pain in left hip: Secondary | ICD-10-CM | POA: Diagnosis not present

## 2023-04-19 DIAGNOSIS — M7062 Trochanteric bursitis, left hip: Secondary | ICD-10-CM | POA: Diagnosis not present

## 2023-04-27 DIAGNOSIS — Z96642 Presence of left artificial hip joint: Secondary | ICD-10-CM | POA: Diagnosis not present

## 2023-04-27 DIAGNOSIS — M7062 Trochanteric bursitis, left hip: Secondary | ICD-10-CM | POA: Diagnosis not present

## 2023-04-27 DIAGNOSIS — Z471 Aftercare following joint replacement surgery: Secondary | ICD-10-CM | POA: Diagnosis not present

## 2023-05-09 ENCOUNTER — Other Ambulatory Visit: Payer: Self-pay | Admitting: Cardiology

## 2023-05-09 ENCOUNTER — Other Ambulatory Visit: Payer: Self-pay | Admitting: Adult Health

## 2023-05-09 DIAGNOSIS — F5101 Primary insomnia: Secondary | ICD-10-CM

## 2023-05-09 DIAGNOSIS — I25118 Atherosclerotic heart disease of native coronary artery with other forms of angina pectoris: Secondary | ICD-10-CM

## 2023-05-09 DIAGNOSIS — I10 Essential (primary) hypertension: Secondary | ICD-10-CM

## 2023-05-09 NOTE — Telephone Encounter (Signed)
 Okay for refill?

## 2023-05-15 ENCOUNTER — Telehealth: Payer: Self-pay

## 2023-05-15 NOTE — Telephone Encounter (Signed)
 Auth Submission: NO AUTH NEEDED Site of care: Site of care: [facility] Payer: Medicare A/B and AARP supplement Medication & CPT/J Code(s) submitted: Leqvio  (Inclisiran) J1306 Route of submission (phone, fax, portal):  Phone # Fax # Auth type: Buy/Bill PB Units/visits requested: 284mg  x 2 doses Reference number:  Approval from: 01/10/23 to 05/31/24   Medicare A/B will cover 80%, supplement will cover the remaining 20%

## 2023-05-16 DIAGNOSIS — M7062 Trochanteric bursitis, left hip: Secondary | ICD-10-CM | POA: Diagnosis not present

## 2023-05-21 DIAGNOSIS — M7062 Trochanteric bursitis, left hip: Secondary | ICD-10-CM | POA: Diagnosis not present

## 2023-05-28 DIAGNOSIS — M25552 Pain in left hip: Secondary | ICD-10-CM | POA: Diagnosis not present

## 2023-05-30 ENCOUNTER — Telehealth: Payer: Self-pay | Admitting: Adult Health

## 2023-05-30 DIAGNOSIS — M7062 Trochanteric bursitis, left hip: Secondary | ICD-10-CM | POA: Diagnosis not present

## 2023-05-30 DIAGNOSIS — Z0279 Encounter for issue of other medical certificate: Secondary | ICD-10-CM

## 2023-05-30 NOTE — Telephone Encounter (Signed)
Patient dropped off document  [employer] Application , to be filled out by provider. Patient requested to send it back via Mail within 7-days. Document is located in providers tray at front office.Please advise at  please mail to: Shelley Coleman [address] [place], [place] [address] [phone]

## 2023-05-31 ENCOUNTER — Encounter: Payer: Self-pay | Admitting: Adult Health

## 2023-05-31 DIAGNOSIS — Z0279 Encounter for issue of other medical certificate: Secondary | ICD-10-CM

## 2023-05-31 NOTE — Telephone Encounter (Signed)
Form placed on providers desk

## 2023-07-10 ENCOUNTER — Ambulatory Visit (HOSPITAL_BASED_OUTPATIENT_CLINIC_OR_DEPARTMENT_OTHER): Payer: Medicare Other | Admitting: Cardiology

## 2023-07-12 DIAGNOSIS — H353221 Exudative age-related macular degeneration, left eye, with active choroidal neovascularization: Secondary | ICD-10-CM | POA: Diagnosis not present

## 2023-07-12 DIAGNOSIS — Z961 Presence of intraocular lens: Secondary | ICD-10-CM | POA: Diagnosis not present

## 2023-07-12 DIAGNOSIS — H353112 Nonexudative age-related macular degeneration, right eye, intermediate dry stage: Secondary | ICD-10-CM | POA: Diagnosis not present

## 2023-07-12 DIAGNOSIS — H43813 Vitreous degeneration, bilateral: Secondary | ICD-10-CM | POA: Diagnosis not present

## 2023-07-16 ENCOUNTER — Telehealth: Payer: Self-pay | Admitting: Cardiology

## 2023-07-16 NOTE — Telephone Encounter (Signed)
 Returned call to patient, no answer, left detailed message, ok per dpr

## 2023-07-16 NOTE — Telephone Encounter (Signed)
 Pt is requesting a callback regarding her wanting to know if she's supposed to have labs done before her upcoming appt. Please advise.

## 2023-07-17 ENCOUNTER — Ambulatory Visit: Payer: Medicare Other | Admitting: *Deleted

## 2023-07-17 VITALS — BP 127/73 | HR 71 | Temp 97.7°F | Resp 20 | Ht 67.5 in | Wt 186.6 lb

## 2023-07-17 DIAGNOSIS — E782 Mixed hyperlipidemia: Secondary | ICD-10-CM

## 2023-07-17 MED ORDER — INCLISIRAN SODIUM 284 MG/1.5ML ~~LOC~~ SOSY
284.0000 mg | PREFILLED_SYRINGE | Freq: Once | SUBCUTANEOUS | Status: AC
  Administered 2023-07-17: 284 mg via SUBCUTANEOUS
  Filled 2023-07-17: qty 1.5

## 2023-07-17 NOTE — Progress Notes (Signed)
 Diagnosis: Hyperlipidemia  Provider:  Chilton Greathouse MD  Procedure: Injection  Leqvio (inclisiran), Dose: 284 mg, Site: subcutaneous, Number of injections: 1  Injection Site(s): Left arm  Post Care: Observation period completed  Discharge: Condition: Good, Destination: Home . AVS Provided  Performed by:  Forrest Moron, RN

## 2023-07-27 ENCOUNTER — Other Ambulatory Visit: Payer: Self-pay | Admitting: Adult Health

## 2023-08-09 ENCOUNTER — Encounter (HOSPITAL_BASED_OUTPATIENT_CLINIC_OR_DEPARTMENT_OTHER): Payer: Self-pay | Admitting: Nurse Practitioner

## 2023-08-09 ENCOUNTER — Ambulatory Visit (INDEPENDENT_AMBULATORY_CARE_PROVIDER_SITE_OTHER): Payer: Medicare Other | Admitting: Nurse Practitioner

## 2023-08-09 VITALS — BP 102/68 | HR 67 | Ht 67.5 in | Wt 185.6 lb

## 2023-08-09 DIAGNOSIS — E785 Hyperlipidemia, unspecified: Secondary | ICD-10-CM

## 2023-08-09 DIAGNOSIS — R55 Syncope and collapse: Secondary | ICD-10-CM | POA: Diagnosis not present

## 2023-08-09 DIAGNOSIS — D500 Iron deficiency anemia secondary to blood loss (chronic): Secondary | ICD-10-CM | POA: Diagnosis not present

## 2023-08-09 DIAGNOSIS — R29898 Other symptoms and signs involving the musculoskeletal system: Secondary | ICD-10-CM

## 2023-08-09 DIAGNOSIS — Z8673 Personal history of transient ischemic attack (TIA), and cerebral infarction without residual deficits: Secondary | ICD-10-CM

## 2023-08-09 DIAGNOSIS — R011 Cardiac murmur, unspecified: Secondary | ICD-10-CM

## 2023-08-09 DIAGNOSIS — I10 Essential (primary) hypertension: Secondary | ICD-10-CM

## 2023-08-09 DIAGNOSIS — I2510 Atherosclerotic heart disease of native coronary artery without angina pectoris: Secondary | ICD-10-CM

## 2023-08-09 NOTE — Patient Instructions (Signed)
 Medication Instructions:  Your physician recommends that you continue on your current medications as directed. Please refer to the Current Medication list given to you today.   *If you need a refill on your cardiac medications before your next appointment, please call your pharmacy*  Lab Work: Your physician recommends that you return for lab work: LIPIDS If you have labs (blood work) drawn today and your tests are completely normal, you will receive your results only by: MyChart Message (if you have MyChart) OR A paper copy in the mail If you have any lab test that is abnormal or we need to change your treatment, we will call you to review the results.  Testing/Procedures: Your physician has requested that you have an echocardiogram. Echocardiography is a painless test that uses sound waves to create images of your heart. It provides your doctor with information about the size and shape of your heart and how well your heart's chambers and valves are working. This procedure takes approximately one hour. There are no restrictions for this procedure. Please do NOT wear cologne, perfume, aftershave, or lotions (deodorant is allowed). Please arrive 15 minutes prior to your appointment time.  Please note: We ask at that you not bring children with you during ultrasound (echo/ vascular) testing. Due to room size and safety concerns, children are not allowed in the ultrasound rooms during exams. Our front office staff cannot provide observation of children in our lobby area while testing is being conducted. An adult accompanying a patient to their appointment will only be allowed in the ultrasound room at the discretion of the ultrasound technician under special circumstances. We apologize for any inconvenience.   Follow-Up: At [facility], you and your health needs are our priority.  As part of our continuing mission to provide you with exceptional heart care, our providers are all part of  one team.  This team includes your primary Cardiologist (physician) and Advanced Practice Providers or APPs (Physician Assistants and Nurse Practitioners) who all work together to provide you with the care you need, when you need it.  Your next appointment:   6 month(s)  Provider:   Jodelle Red, MD, Eligha Bridegroom, NP, or Gillian Shields, NP    We recommend signing up for the patient portal called "MyChart".  Sign up information is provided on this After Visit Summary.  MyChart is used to connect with patients for Virtual Visits (Telemedicine).  Patients are able to view lab/test results, encounter notes, upcoming appointments, etc.  Non-urgent messages can be sent to your provider as well.   To learn more about what you can do with MyChart, go to [email or web address].

## 2023-08-09 NOTE — Progress Notes (Signed)
 Cardiology Office Note:  .   Date:  08/09/2023  ID:  Shelley Coleman, DOB [birth year], MRN [id number] PCP: Chilton Greathouse, MD  [facility] Providers Cardiologist:  Jodelle Red, MD    Patient Profile: .      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 Cristal Deer 06/02/2021.  Coronary CTA 06/16/2021 with coronary calcium score of 440 and aortic valve calcium score of 153.  There was concern for significant stenosis in mid RCA which was borderline on FFR.  She developed severe body aches and myalgias on statin therapy now tolerating Leqvio.   At office visit 06/29/2022 she complained of shortness of breath and audible wheezing when walking short distances.  She felt presyncopal at times when standing up with lightheadedness which she associated with GI bleeding and anemia.  She had received an iron infusion a few weeks prior at [facility].  She was seen by Dr. Adela Lank and reported melena.  Subsequent EGD showed persistence of duodenal ulcers, and large hiatal hernia, both of which could be related to her symptoms.  She continued Plavix given history of CVA and recommended for Voquenza.  Echo 06/2022 revealed LVEF 55 to 60%, trivial MR, and aortic valve sclerosis/Without evidence of aortic stenosis.  Last cardiology clinic visit was 01/08/2023.  Her main complaint is left hip pain limiting her activity recently treated with cortisone and followed by Dr. Despina Hick.  She was able to walk further with the assistance of a walker.  She reported syncopal episode without prodrome about 2 months prior in the setting of taking her  dog to the vet.  She opened the car door and the next thing she remembers is waking up on the pavement.  She was taken to the ER but workup was unremarkable.  She had no further episodes.  Initial BP 145/75 and 132/84 on manual recheck.  She continued to receive B12 injections every few weeks with hematology.       History of Present Illness: .    History of Present Illness Shelley Coleman is a very pleasant 83 y.o. female here today for follow-up of CAD. She reports left hip pain which has been very frustrating. Despite rigorous work with PT she continues to have mobility issues. She walks with a cane. She reports feeling tired and sluggish, often needing to rest or nap during the day. She wonders if her leg was affected by previous strokes. She reports a fall earlier in the day due to her leg giving way.  Thankfully, she did not strike her head and the fall was cushioned by the carpet. She also experiences vertigo, though not every day. She continues to receive B12 injections once a month and takes ferrous sulfate. Despite her health issues, she continues to live alone and manages her daily activities independently. She reports occasional shortness of breath. No edema, orthopnea, PND. She denies chest pain, palpitations, presyncope or syncope.   Discussed the use of AI scribe software for clinical note transcription with the patient, who gave verbal consent to proceed.   ROS: See HPI       Studies Reviewed: Marland Kitchen   EKG  Interpretation Date/Time:  Thursday August 09 2023 14:34:03 EDT Ventricular Rate:  67 PR Interval:  166 QRS Duration:  84 QT Interval:  418 QTC Calculation: 441 R Axis:   62  Text Interpretation: Normal sinus rhythm Normal ECG When compared with ECG of 28-Oct-2020 16:40, No significant change was found Confirmed by Eligha Bridegroom [phone]) on 08/09/2023 2:41:05 PM     No results found for: "LIPOA"   Risk Assessment/Calculations:             Physical Exam:   VS:  BP 102/68    Pulse 67   Ht 5' 7.5" (1.715 m)   Wt 185 lb 9.6 oz (84.2 kg)   SpO2 98%   BMI 28.64 kg/m    Wt Readings from Last 3 Encounters:  08/09/23 185 lb 9.6 oz (84.2 kg)  07/17/23 186 lb 9.6 oz (84.6 kg)  01/31/23 188 lb 6.4 oz (85.5 kg)    GEN: Well nourished, well developed in no acute distress NECK: No JVD; No carotid bruits CARDIAC: RRR, 3/6 systolic murmur. No rubs, gallops RESPIRATORY:  Clear to auscultation without rales, wheezing or rhonchi  ABDOMEN: Soft, non-tender, non-distended EXTREMITIES:  No edema; No deformity     ASSESSMENT AND PLAN: .    Assessment & Plan Coronary artery disease History of moderate stenosis mRCA on CCTA 06/16/2021. She denies chest pain, dyspnea, or other symptoms concerning for angina.  No indication for further ischemic evaluation at this time.  She is on Plavix due to history of stroke.  Continue atenolol, Leqvio.  Aortic valve calcification She reports shortness of breath on occasion and exertional wheezing. Systolic murmur present. History of moderate calcification of the aortic valve with no evidence of stenosis on echo 07/20/2022.  We will repeat echocardiogram for surveillance.  Right leg weakness and instability/History of stroke Right leg weakness and instability possibly related to stroke history and previous hip replacement are contributing to falls. She uses caution with movement and uses a cane for assistance. She plans to follow-up soon with neurology. No acute concerns today. Continue Plavix.   Hyperlipidemia LDL goal < 70 Lipid panel completed 12/14/2022 with total cholesterol 142, LDL 84, triglycerides 63, HDL 44. She is now on Leqvio with most recent injection 07/17/23. No concerns with side effects. Will have her return for fasting lipid panel at her earliest convenience.   Anemia   Low hemoglobin is managed with B12 injections and ferrous sulfate. Fatigue may be related to anemia. Reports she is due to see hematologist soon.    Syncope She continues to have gait instability but no evidence or presyncope or syncope. No prodrome or post-ictal symptoms prior to syncopal event 10/2022. Unremarkable echo 06/2022.  No indication for further testing at this time.         Disposition:6 months with Dr. Cristal Deer or APP  Signed, Eligha Bridegroom, NP-C

## 2023-08-14 DIAGNOSIS — I2510 Atherosclerotic heart disease of native coronary artery without angina pectoris: Secondary | ICD-10-CM | POA: Diagnosis not present

## 2023-08-14 LAB — LIPID PANEL
Chol/HDL Ratio: 2.4 ratio (ref 0.0–4.4)
Cholesterol, Total: 147 mg/dL (ref 100–199)
HDL: 61 mg/dL (ref >39)
LDL Chol Calc (NIH): 62 mg/dL (ref 0–99)
Triglycerides: 140 mg/dL (ref 0–149)
VLDL Cholesterol Cal: 24 mg/dL (ref 5–40)

## 2023-08-15 ENCOUNTER — Telehealth (HOSPITAL_BASED_OUTPATIENT_CLINIC_OR_DEPARTMENT_OTHER): Payer: Self-pay | Admitting: Cardiology

## 2023-08-15 ENCOUNTER — Encounter (HOSPITAL_BASED_OUTPATIENT_CLINIC_OR_DEPARTMENT_OTHER): Payer: Self-pay

## 2023-08-15 NOTE — Telephone Encounter (Signed)
 Patient called to follow up on lab test results.

## 2023-08-15 NOTE — Telephone Encounter (Signed)
 Lab results have been seen by pt. Seen by patient Shelley Coleman on 08/15/2023  1:16 PM

## 2023-08-23 ENCOUNTER — Telehealth: Payer: Self-pay | Admitting: Gastroenterology

## 2023-08-23 MED ORDER — VOQUEZNA 20 MG PO TABS: 1.0000 | ORAL_TABLET | Freq: Every day | ORAL | 1 refills | Status: DC

## 2023-08-23 NOTE — Telephone Encounter (Signed)
 PT is calling to get a refill on voquenza. It really helped her a lot and now that she's completely out she notices that symptoms are coming back. Please advise.

## 2023-08-23 NOTE — Telephone Encounter (Signed)
 Patient states she has ran out Voquezna  and has had some chest pain and burning in her esophagus. Informed patient I can send a refill to her pharmacy. Patient states the medications is $300 but it is the only thing that helps her symptoms. Offered patient some samples until she can financially pick up the prescription. Patient states she would like the samples. Samples left at front desk for patient to pick. Also, prescription sent to patient's pharmacy.

## 2023-08-24 ENCOUNTER — Other Ambulatory Visit (HOSPITAL_COMMUNITY): Payer: Self-pay

## 2023-08-24 ENCOUNTER — Telehealth: Payer: Self-pay

## 2023-08-24 NOTE — Telephone Encounter (Signed)
 Pharmacy Patient Advocate Encounter  Received notification from [employer] Part D that Prior Authorization for Voquezna  20MG  tablets has been APPROVED from 08-24-2023 to 04-30-2024   PA #/Case ID/Reference #: [id number]

## 2023-08-24 NOTE — Telephone Encounter (Signed)
 Voquezna approved.

## 2023-08-24 NOTE — Telephone Encounter (Signed)
 Pharmacy Patient Advocate Encounter   Received notification from CoverMyMeds that prior authorization for Voquezna  20MG  tablets is required/requested.   Insurance verification completed.   The patient is insured through [facility] Medicare Part D .   Per test claim: PA required; PA submitted to above mentioned insurance via CoverMyMeds Key/confirmation #/EOC [facility] Status is pending

## 2023-08-24 NOTE — Telephone Encounter (Signed)
 Contacted patient and let her know Voquezna  is approved.

## 2023-09-10 ENCOUNTER — Ambulatory Visit (HOSPITAL_BASED_OUTPATIENT_CLINIC_OR_DEPARTMENT_OTHER)

## 2023-09-10 DIAGNOSIS — I2510 Atherosclerotic heart disease of native coronary artery without angina pectoris: Secondary | ICD-10-CM

## 2023-09-10 LAB — ECHOCARDIOGRAM COMPLETE
AR max vel: 1.5 cm2
AV Area VTI: 1.55 cm2
AV Area mean vel: 1.49 cm2
AV Mean grad: 11 mmHg
AV Peak grad: 20.4 mmHg
Ao pk vel: 2.26 m/s
Area-P 1/2: 3.06 cm2
S' Lateral: 3.11 cm

## 2023-09-11 ENCOUNTER — Ambulatory Visit (HOSPITAL_BASED_OUTPATIENT_CLINIC_OR_DEPARTMENT_OTHER): Payer: Self-pay | Admitting: Nurse Practitioner

## 2023-10-10 DIAGNOSIS — E538 Deficiency of other specified B group vitamins: Secondary | ICD-10-CM | POA: Diagnosis not present

## 2023-10-10 DIAGNOSIS — D500 Iron deficiency anemia secondary to blood loss (chronic): Secondary | ICD-10-CM | POA: Diagnosis not present

## 2023-11-05 ENCOUNTER — Telehealth: Payer: Self-pay | Admitting: Gastroenterology

## 2023-11-05 NOTE — Telephone Encounter (Signed)
 PT had a GI bleed over the weekend and wants to speak with a nurse to find out what to do from here. Please advise.

## 2023-11-05 NOTE — Telephone Encounter (Signed)
 Patient calls stating that Friday night, she started having intense epigastric abdominal burning. She recently ran out of Voquezna  and said that she plans to request pharmacy refill today. States that when she woke up Saturday morning, she had abdominal bloating but burning had subsided. This was followed by 3 massive explosions of black stool. States she waited over the weekend to see if things got better since she did not want to go to the ER or urgent care with someone who didn't know her history. Has since had a soft, formed, brown bowel movement yesterday. No further pain. Upon questioning, patient states that she did take about 1 tablespoon of pepto bismol Friday PM. She is advised that the black stool with subsequent resolution is likely due to Pepto Bismol. She is reminded to restart voquezna  and if she develops additional black stool along with severe pain, SOB, dizziness, she should be seen in the ER. She verbalizes understanding.

## 2023-11-06 NOTE — Telephone Encounter (Signed)
 Agree with your recommendations. Thanks [employer]

## 2023-11-07 DIAGNOSIS — E538 Deficiency of other specified B group vitamins: Secondary | ICD-10-CM | POA: Diagnosis not present

## 2023-11-15 DIAGNOSIS — H43813 Vitreous degeneration, bilateral: Secondary | ICD-10-CM | POA: Diagnosis not present

## 2023-11-15 DIAGNOSIS — H353221 Exudative age-related macular degeneration, left eye, with active choroidal neovascularization: Secondary | ICD-10-CM | POA: Diagnosis not present

## 2023-11-15 DIAGNOSIS — Z961 Presence of intraocular lens: Secondary | ICD-10-CM | POA: Diagnosis not present

## 2023-11-15 DIAGNOSIS — H353112 Nonexudative age-related macular degeneration, right eye, intermediate dry stage: Secondary | ICD-10-CM | POA: Diagnosis not present

## 2023-12-03 ENCOUNTER — Other Ambulatory Visit: Payer: Self-pay | Admitting: Adult Health

## 2023-12-03 DIAGNOSIS — F5101 Primary insomnia: Secondary | ICD-10-CM

## 2023-12-07 ENCOUNTER — Other Ambulatory Visit: Payer: Self-pay | Admitting: Gastroenterology

## 2023-12-18 ENCOUNTER — Encounter: Payer: Self-pay | Admitting: Adult Health

## 2023-12-18 ENCOUNTER — Ambulatory Visit: Admitting: Adult Health

## 2023-12-18 VITALS — BP 140/90 | HR 89 | Temp 98.0°F

## 2023-12-18 DIAGNOSIS — S90851A Superficial foreign body, right foot, initial encounter: Secondary | ICD-10-CM | POA: Diagnosis not present

## 2023-12-18 MED ORDER — DOXYCYCLINE HYCLATE 100 MG PO CAPS
100.0000 mg | ORAL_CAPSULE | Freq: Two times a day (BID) | ORAL | 0 refills | Status: DC
Start: 2023-12-18 — End: 2024-01-28

## 2023-12-18 NOTE — Progress Notes (Signed)
 Subjective:    Patient ID: Shelley Coleman, female    DOB: [birth year], 83 y.o.   MRN: [id number]  HPI 83 year old female who  has a past medical history of Anemia, Breast cancer (HCC), Cerebrovascular disease (11/12/2018), Depression, Depression, GERD (gastroesophageal reflux disease), Hiatal hernia, History of GI bleed, mitral valve prolapse, Hyperlipidemia, Irregular heart beat, Neuropathy, Nocturnal leg cramps (09/02/2015), Optic neuritis, Osteopenia, Ovarian cyst, Parotid tumor, Pelvic fracture (HCC), Restless legs syndrome, Shingles (2000), Stroke (HCC), and Syncopal episodes (06/28/2022).  She presents to the office today for an acute visit. She reports that she was at the beach and was walking around barefoot. Yesterday she noticed that she had pain in the heel of her right foot.  She was able to remove the the splinter but she still feels like something is in her foot. She is unsure of what the splinter was made of    Review of Systems See HPI   Past Medical History:  Diagnosis Date   Anemia    Has had iron  infusions   Breast cancer (HCC)    right   Cerebrovascular disease 11/12/2018   Depression    Depression    GERD (gastroesophageal reflux disease)    Hiatal hernia    History of GI bleed    Hx of mitral valve prolapse    Hyperlipidemia    Irregular heart beat    Neuropathy    numbness in both feet   Nocturnal leg cramps 09/02/2015   Optic neuritis    left   Osteopenia    Ovarian cyst    Parotid tumor    right   Pelvic fracture (HCC)    Restless legs syndrome    Shingles 2000   Left in V1   Stroke (HCC)    Syncopal episodes 06/28/2022    Social History   Socioeconomic History   Marital status: Widowed    Spouse name: Not on file   Number of children: 2   Years of education: 64   Highest education level: Not on file  Occupational History   Occupation: Retired Charity fundraiser  Tobacco Use   Smoking status: Never   Smokeless tobacco: Never  Vaping Use   Vaping  status: Never Used  Substance and Sexual Activity   Alcohol use: Yes    Alcohol/week: 1.0 standard drink of alcohol    Types: 1 Glasses of wine per week    Comment: Rarely,socially   Drug use: No   Sexual activity: Not on file  Other Topics Concern   Not on file  Social History Narrative   Retired from nursing    Widowed    Left-handed      Social Drivers of Corporate investment banker Strain: Low Risk  (01/19/2022)   Overall Financial Resource Strain (CARDIA)    Difficulty of Paying Living Expenses: Not hard at all  Food Insecurity: No Food Insecurity (01/19/2022)   Hunger Vital Sign    Worried About Running Out of Food in the Last Year: Never true    Ran Out of Food in the Last Year: Never true  Transportation Needs: No Transportation Needs (01/19/2022)   PRAPARE - Administrator, Civil Service (Medical): No    Lack of Transportation (Non-Medical): No  Physical Activity: Inactive (01/19/2022)   Exercise Vital Sign    Days of Exercise per Week: 0 days    Minutes of Exercise per Session: 0 min  Stress: No Stress Concern Present (01/19/2022)  [employer] of Occupational Health - Occupational Stress Questionnaire    Feeling of Stress : Not at all  Social Connections: Moderately Integrated (01/19/2022)   Social Connection and Isolation Panel    Frequency of Communication with Friends and Family: More than three times a week    Frequency of Social Gatherings with Friends and Family: More than three times a week    Attends Religious Services: More than 4 times per year    Active Member of [employer] or Organizations: Yes    Attends Banker Meetings: More than 4 times per year    Marital Status: Widowed  Intimate Partner Violence: Not At Risk (01/19/2022)   Humiliation, Afraid, Rape, and Kick questionnaire    Fear of Current or Ex-Partner: No    Emotionally Abused: No    Physically Abused: No    Sexually Abused: No    Past Surgical History:  Procedure  Laterality Date   ABDOMINAL HYSTERECTOMY     APPENDECTOMY     BREAST LUMPECTOMY Left    CATARACT EXTRACTION     ESOPHAGEAL MANOMETRY N/A 01/13/2013   Procedure: ESOPHAGEAL MANOMETRY (EM);  Surgeon: Alm JONELLE Gander, MD;  Location: [facility];  Service: Endoscopy;  Laterality: N/A;   ESOPHAGOGASTRODUODENOSCOPY (EGD) WITH PROPOFOL  N/A 10/30/2020   Procedure: ESOPHAGOGASTRODUODENOSCOPY (EGD) WITH PROPOFOL ;  Surgeon: Shila Gustav GAILS, MD;  Location: [facility];  Service: Endoscopy;  Laterality: N/A;   FOREIGN BODY REMOVAL  10/30/2020   Procedure: FOREIGN BODY REMOVAL;  Surgeon: Shila Gustav GAILS, MD;  Location: [facility];  Service: Endoscopy;;   HERNIA REPAIR  08/2009   HOT HEMOSTASIS N/A 10/30/2020   Procedure: HOT HEMOSTASIS (ARGON PLASMA COAGULATION/BICAP);  Surgeon: Nandigam, Kavitha V, MD;  Location: [facility] ENDOSCOPY;  Service: Endoscopy;  Laterality: N/A;   MASTECTOMY Right    SPINE SURGERY     TOTAL HIP ARTHROPLASTY Left     Family History  Problem Relation Age of Onset   Cancer Mother        Angio sarcoma    Heart disease Father    Hypertension Father    Stroke Father        x2   Heart attack Father        x 11   Hernia Brother    Hypertension Brother    Neuropathy Neg Hx    Restless legs syndrome Neg Hx    Colon cancer Neg Hx    Esophageal cancer Neg Hx    Pancreatic cancer Neg Hx    Stomach cancer Neg Hx    Rectal cancer Neg Hx    Sleep apnea Neg Hx     Allergies  Allergen Reactions   Diflunisal     REACTION: rash , swelling  anaphlaxsis   Lipitor [Atorvastatin ]     Pt had body joint pain   Statins     Myalgia     Tetanus Antitoxin     Other Reaction(s): Not available   Tetanus Toxoid     REACTION: arm swelling, fever    Current Outpatient Medications on File Prior to Visit  Medication Sig Dispense Refill   acetaminophen  (TYLENOL ) 500 MG tablet Take 1,000 mg by mouth every 6 (six) hours as needed for moderate pain or headache.     ALPRAZolam  (XANAX )  1 MG tablet TAKE 1/2 TO 1 TABLET BY MOUTH TWICE DAILY AS NEEDED FOR  ANXIETY 30 tablet 0   atenolol  (TENORMIN ) 50 MG tablet Take 1 tablet by mouth once daily 90 tablet  0   clopidogrel  (PLAVIX ) 75 MG tablet Take 1 tablet by mouth once daily 90 tablet 0   cyanocobalamin  (VITAMIN B12) 1000 MCG/ML injection 1,000 mcg every 30 (thirty) days.     gabapentin  (NEURONTIN ) 300 MG capsule Take 3 capsules (900 mg total) by mouth at bedtime. 270 capsule 3   Inclisiran Sodium  (LEQVIO  Elsa) Inject 1 mL into the skin every 6 (six) months.     Multiple Vitamins-Minerals (CENTRUM SILVER 50+WOMEN) TABS Take 1 tablet by mouth daily.     Multiple Vitamins-Minerals (PRESERVISION AREDS 2 PO) Take 1 tablet by mouth in the morning and at bedtime.     nitroGLYCERIN  (NITROSTAT ) 0.4 MG SL tablet DISSOLVE ONE TABLET UNDER THE TONGUE EVERY 5 MINUTES AS NEEDED 25 tablet 0   Polyethyl Glycol-Propyl Glycol (SYSTANE OP) Place 1 drop into both eyes daily as needed (dry eyes).     sucralfate  (CARAFATE ) 1 g tablet TAKE 1 TABLET BY MOUTH 4 TIMES DAILY PLEASE  TAKE  MEDICATION  2  HOURS  BEFORE  OR  AFTER  OTHER  MEDICATION 120 tablet 0   UNABLE TO FIND Med Name: Avastin   left eye injection     Vonoprazan Fumarate  (VOQUEZNA ) 20 MG TABS Take 1 tablet by mouth once daily 30 tablet 3   No current facility-administered medications on file prior to visit.    BP (!) 140/90   Pulse 89   Temp 98 F (36.7 C) (Oral)   SpO2 98%       Objective:   Physical Exam Vitals and nursing note reviewed.  Constitutional:      Appearance: Normal appearance.  Musculoskeletal:        General: Normal range of motion.       Feet:  Skin:    General: Skin is warm and dry.  Neurological:     General: No focal deficit present.     Mental Status: She is alert and oriented to person, place, and time.  Psychiatric:        Mood and Affect: Mood normal.        Behavior: Behavior normal.        Thought Content: Thought content normal.         Judgment: Judgment normal.           Assessment & Plan:  1. Foreign body in right foot, initial encounter (Primary) - I did not see any additional foreign body in her heel. Will send in Doxycycline  to cover for infection. She does have a Tetanus allergy so we will forgo tdap today  - doxycycline  (VIBRAMYCIN ) 100 MG capsule; Take 1 capsule (100 mg total) by mouth 2 (two) times daily.  Dispense: 14 capsule; Refill: 0 - Follow up as needed  Darleene Shape, NP  I personally spent a total of 32 minutes in the care of the patient today including preparing to see the patient, performing a medically appropriate exam/evaluation, counseling and educating, placing orders, and documenting clinical information in the EHR.

## 2024-01-09 DIAGNOSIS — D500 Iron deficiency anemia secondary to blood loss (chronic): Secondary | ICD-10-CM | POA: Diagnosis not present

## 2024-01-09 DIAGNOSIS — E538 Deficiency of other specified B group vitamins: Secondary | ICD-10-CM | POA: Diagnosis not present

## 2024-01-15 DIAGNOSIS — E538 Deficiency of other specified B group vitamins: Secondary | ICD-10-CM | POA: Diagnosis not present

## 2024-01-15 DIAGNOSIS — D500 Iron deficiency anemia secondary to blood loss (chronic): Secondary | ICD-10-CM | POA: Diagnosis not present

## 2024-01-18 ENCOUNTER — Ambulatory Visit (INDEPENDENT_AMBULATORY_CARE_PROVIDER_SITE_OTHER): Admitting: *Deleted

## 2024-01-18 VITALS — BP 130/78 | HR 70 | Temp 98.0°F | Resp 17 | Ht 67.0 in | Wt 186.4 lb

## 2024-01-18 DIAGNOSIS — E782 Mixed hyperlipidemia: Secondary | ICD-10-CM

## 2024-01-18 MED ORDER — INCLISIRAN SODIUM 284 MG/1.5ML ~~LOC~~ SOSY
284.0000 mg | PREFILLED_SYRINGE | Freq: Once | SUBCUTANEOUS | Status: AC
  Administered 2024-01-18: 284 mg via SUBCUTANEOUS
  Filled 2024-01-18: qty 1.5

## 2024-01-18 NOTE — Progress Notes (Signed)
 Diagnosis: Hyperlipidemia  Provider:  Mannam, Praveen MD  Procedure: Injection  Leqvio  (inclisiran), Dose: 284 mg, Site: subcutaneous, Number of injections: 1  Injection Site(s): Left arm  Post Care: Patient declined observation  Discharge: Condition: Good, Destination: Home . AVS Declined  Performed by:  Mathew Therisa NOVAK, RN

## 2024-01-24 NOTE — Progress Notes (Signed)
 Shelley Coleman

## 2024-01-28 ENCOUNTER — Encounter: Payer: Self-pay | Admitting: Adult Health

## 2024-01-28 ENCOUNTER — Ambulatory Visit: Payer: Medicare Other | Admitting: Adult Health

## 2024-01-28 VITALS — BP 132/84 | HR 69 | Ht 67.5 in | Wt 185.0 lb

## 2024-01-28 DIAGNOSIS — R269 Unspecified abnormalities of gait and mobility: Secondary | ICD-10-CM | POA: Diagnosis not present

## 2024-01-28 DIAGNOSIS — G4733 Obstructive sleep apnea (adult) (pediatric): Secondary | ICD-10-CM | POA: Diagnosis not present

## 2024-01-28 DIAGNOSIS — G609 Hereditary and idiopathic neuropathy, unspecified: Secondary | ICD-10-CM | POA: Diagnosis not present

## 2024-01-28 DIAGNOSIS — G2581 Restless legs syndrome: Secondary | ICD-10-CM

## 2024-01-28 MED ORDER — GABAPENTIN 300 MG PO CAPS: 600.0000 mg | ORAL_CAPSULE | Freq: Every day | ORAL | 3 refills | Status: AC

## 2024-01-28 NOTE — Patient Instructions (Signed)
 Continue gabapentin  600 mg daily Continue CPAP therapy encouraged you to try to use it all night long Will discuss imaging with Dr. Onita

## 2024-01-28 NOTE — Progress Notes (Signed)
 PATIENT: Shelley Coleman DOB: October 19, 1940  REASON FOR VISIT: follow up HISTORY FROM: patient PRIMARY NEUROLOGIST: Dr. Onita  Chief Complaint  Patient presents with   Rm 4    Patient is here alone for yearly follow-up for cpap, neuropathy, and RLS. Pt states her main concern is finding out what is wrong with her legs. Saw orthopedics last year. ESS 8.     HISTORY OF PRESENT ILLNESS: Today 01/28/24:  Shelley Coleman is a 83 y.o. female with a history of peripheral neuropathy, restless leg syndrome, back pain and obstructive sleep apnea on CPAP. Returns today for follow-up.  In regards to the CPAP she states that there are some nights she wakes up and is too cumbersome to put back on.  She currently has a fullface mask.  She is willing to try different style mask.  She states that she was able to back off of gabapentin  to 600 mg at bedtime and that seems to be working well.  She states that she still has trouble with ambulation.  Was seeing Dr. Beckie but he has released her.  She states that she gets a weird feeling that runs down the right buttocks down the leg to the foot.  She states that it does feel like her sciatic nerve is pulling.  She states that if she tries to walk without her cane it feels like her right leg is turning inward.  With the cane she has more stability.  She has been told by her orthopedist that it is due to weak muscles.  She did have a lumbar MRI in 2023.  She returns today for an evaluation.      10/17/21:  Shelley Coleman is a 83 year old female with a history of peripheral neuropathy, restless leg syndrome and back pain.  She returns today for follow-up. Continues to having pins and needle sensation in the bottom of feet. Disrupts her sleep at night. Tried multiple medications in the past. Has had two falls in the last year.  She states both falls occurred on her carpet in the den.  Fortunately no injuries.  She states that she also struggles with restless legs at night.   She is unsure if she is got the 3 mg tablet of Requip  or the 1 mg tablet of Requip .  She returns today for an evaluation.  04/18/21: Shelley Coleman is a 83 year old female with a history of peripheral neuropathy, restless leg syndrome and back pain.  She returns today for follow-up.  She feels that her symptoms have gotten worse.  She had back surgery in 2014 and states over time she feels that her back pain has gotten worse.  States that it is in the lower back and bilaterally.  Occasionally she will have pain that goes down the right leg.  She feels that the numbness burning and tingling in both feet have gotten worse.  She states that she used a hair dryer to dry her toenails but could not feel the heat.  She notices that her balance is off but has not had any falls.  She has a hard time sleeping at night due to restless legs.  Sometimes she will lay awake 1 to 2 hours before she is able to go to sleep.  She states that taking Requip  nightly makes her drowsy the next morning.  According to the patient she has tried taking Requip  several different ways such as earlier in the afternoon.  She states that she had spinal  injections prior to 2014-she noticed that she had limited movement in her right toes after injections.  States that she is unable to turn or curl her toes.  HISTORY Shelley Coleman is a 83 year old right-handed white female with a history of a peripheral neuropathy and some low back pain.  The patient also suffers from restless leg syndrome.  She has had a sensitive stomach, she has gone off many of her medications because of this.  She takes 1 to 2 mg of Requip  in the evening, if she takes more than this she has a hangover feeling the next morning.  She is not sleeping well, she has fatigue during the day.  She has started vitamin B12.  She has burning in the feet at night.  In the past, she got no benefit from gabapentin , Lyrica, or Cymbalta .  She takes melatonin at night.  She tries to walk 1 to 1/2 miles  daily, she does have some shortness of breath and she goes up hills.  She returns to this office for an evaluation   REVIEW OF SYSTEMS: Out of a complete 14 system review of symptoms, the patient complains only of the following symptoms, and all other reviewed systems are negative.  ALLERGIES: Allergies  Allergen Reactions   Diflunisal     REACTION: rash , swelling  anaphlaxsis   Lipitor [Atorvastatin ]     Pt had body joint pain   Statins     Myalgia     Tetanus Antitoxin     Other Reaction(s): Not available   Tetanus Toxoid     REACTION: arm swelling, fever    HOME MEDICATIONS: Outpatient Medications Prior to Visit  Medication Sig Dispense Refill   acetaminophen  (TYLENOL ) 500 MG tablet Take 1,000 mg by mouth every 6 (six) hours as needed for moderate pain or headache.     ALPRAZolam  (XANAX ) 1 MG tablet TAKE 1/2 TO 1 TABLET BY MOUTH TWICE DAILY AS NEEDED FOR  ANXIETY 30 tablet 0   atenolol  (TENORMIN ) 50 MG tablet Take 1 tablet by mouth once daily 90 tablet 0   clopidogrel  (PLAVIX ) 75 MG tablet Take 1 tablet by mouth once daily 90 tablet 0   cyanocobalamin  (VITAMIN B12) 1000 MCG/ML injection 1,000 mcg every 30 (thirty) days.     Inclisiran Sodium  (LEQVIO  [place]) Inject 1 mL into the skin every 6 (six) months.     Multiple Vitamins-Minerals (CENTRUM SILVER 50+WOMEN) TABS Take 1 tablet by mouth daily.     Multiple Vitamins-Minerals (PRESERVISION AREDS 2 PO) Take 1 tablet by mouth in the morning and at bedtime.     nitroGLYCERIN  (NITROSTAT ) 0.4 MG SL tablet DISSOLVE ONE TABLET UNDER THE TONGUE EVERY 5 MINUTES AS NEEDED 25 tablet 0   Polyethyl Glycol-Propyl Glycol (SYSTANE OP) Place 1 drop into both eyes daily as needed (dry eyes).     sucralfate  (CARAFATE ) 1 g tablet TAKE 1 TABLET BY MOUTH 4 TIMES DAILY PLEASE  TAKE  MEDICATION  2  HOURS  BEFORE  OR  AFTER  OTHER  MEDICATION 120 tablet 0   UNABLE TO FIND Med Name: iron  infusions at [facility], last August 2025     Vonoprazan Fumarate  (VOQUEZNA )  20 MG TABS Take 1 tablet by mouth once daily 30 tablet 3   gabapentin  (NEURONTIN ) 300 MG capsule Take 3 capsules (900 mg total) by mouth at bedtime. 270 capsule 3   UNABLE TO FIND Med Name: Avastin   left eye injection     doxycycline  (VIBRAMYCIN ) 100 MG capsule  Take 1 capsule (100 mg total) by mouth 2 (two) times daily. 14 capsule 0   No facility-administered medications prior to visit.    PAST MEDICAL HISTORY: Past Medical History:  Diagnosis Date   Anemia    Has had iron  infusions   Breast cancer (HCC)    right   Cerebrovascular disease 11/12/2018   Depression    Depression    GERD (gastroesophageal reflux disease)    Hiatal hernia    History of GI bleed    sees [facility] hematology   Hx of mitral valve prolapse    Hyperlipidemia    Irregular heart beat    Neuropathy    numbness in both feet   Nocturnal leg cramps 09/02/2015   Optic neuritis    left   Osteopenia    Ovarian cyst    Parotid tumor    right   Pelvic fracture (HCC)    Restless legs syndrome    Shingles 2000   Left in V1   Stroke (HCC)    Syncopal episodes 06/28/2022    PAST SURGICAL HISTORY: Past Surgical History:  Procedure Laterality Date   ABDOMINAL HYSTERECTOMY     APPENDECTOMY     BREAST LUMPECTOMY Left    CATARACT EXTRACTION     ESOPHAGEAL MANOMETRY N/A 01/13/2013   Procedure: ESOPHAGEAL MANOMETRY (EM);  Surgeon: Alm JONELLE Gander, MD;  Location: [facility] END[facility];  Service: Endoscopy;  Laterality: N/A;   ESOPHAGOGASTRODUODENOSCOPY (EGD) WITH PROPOFOL  N/A 10/30/2020   Procedure: ESOPHAGOGASTRODUODENOSCOPY (EGD) WITH PROPOFOL ;  Surgeon: Shila Gustav GAILS, MD;  Location: [facility] ENDOSCOPY;  Service: Endoscopy;  Laterality: N/A;   FOREIGN BODY REMOVAL  10/30/2020   Procedure: FOREIGN BODY REMOVAL;  Surgeon: Shila Gustav GAILS, MD;  Location: [facility] ENDOSCOPY;  Service: Endoscopy;;   HERNIA REPAIR  08/2009   HOT HEMOSTASIS N/A 10/30/2020   Procedure: HOT HEMOSTASIS (ARGON PLASMA COAGULATION/BICAP);  Surgeon:  Nandigam, Kavitha V, MD;  Location: [facility] ENDOSCOPY;  Service: Endoscopy;  Laterality: N/A;   MASTECTOMY Right    SPINE SURGERY     TOTAL HIP ARTHROPLASTY Left     FAMILY HISTORY: Family History  Problem Relation Age of Onset   Cancer Mother        Angio sarcoma    Heart disease Father    Hypertension Father    Stroke Father        x2   Heart attack Father        x 11   Hernia Brother    Hypertension Brother    Neuropathy Neg Hx    Restless legs syndrome Neg Hx    Colon cancer Neg Hx    Esophageal cancer Neg Hx    Pancreatic cancer Neg Hx    Stomach cancer Neg Hx    Rectal cancer Neg Hx    Sleep apnea Neg Hx     SOCIAL HISTORY: Social History   Socioeconomic History   Marital status: Widowed    Spouse name: Not on file   Number of children: 2   Years of education: 63   Highest education level: Not on file  Occupational History   Occupation: Retired Charity fundraiser  Tobacco Use   Smoking status: Never   Smokeless tobacco: Never  Vaping Use   Vaping status: Never Used  Substance and Sexual Activity   Alcohol use: Yes    Alcohol/week: 1.0 standard drink of alcohol    Types: 1 Glasses of wine per week    Comment: Rarely,socially   Drug use: No  Sexual activity: Not on file  Other Topics Concern   Not on file  Social History Narrative   Retired from nursing    Widowed    Left-handed   Lives alone    Social Drivers of Health   Financial Resource Strain: Low Risk  (01/19/2022)   Overall Financial Resource Strain (CARDIA)    Difficulty of Paying Living Expenses: Not hard at all  Food Insecurity: No Food Insecurity (01/19/2022)   Hunger Vital Sign    Worried About Running Out of Food in the Last Year: Never true    Ran Out of Food in the Last Year: Never true  Transportation Needs: No Transportation Needs (01/19/2022)   PRAPARE - Administrator, Civil Service (Medical): No    Lack of Transportation (Non-Medical): No  Physical Activity: Inactive (01/19/2022)    Exercise Vital Sign    Days of Exercise per Week: 0 days    Minutes of Exercise per Session: 0 min  Stress: No Stress Concern Present (01/19/2022)   [employer] of Occupational Health - Occupational Stress Questionnaire    Feeling of Stress : Not at all  Social Connections: Moderately Integrated (01/19/2022)   Social Connection and Isolation Panel    Frequency of Communication with Friends and Family: More than three times a week    Frequency of Social Gatherings with Friends and Family: More than three times a week    Attends Religious Services: More than 4 times per year    Active Member of [employer] or Organizations: Yes    Attends Banker Meetings: More than 4 times per year    Marital Status: Widowed  Intimate Partner Violence: Not At Risk (01/19/2022)   Humiliation, Afraid, Rape, and Kick questionnaire    Fear of Current or Ex-Partner: No    Emotionally Abused: No    Physically Abused: No    Sexually Abused: No      PHYSICAL EXAM  Vitals:   01/28/24 1304  BP: 132/84  Pulse: 69  SpO2: 95%  Weight: 185 lb (83.9 kg)  Height: 5' 7.5 (1.715 m)    Body mass index is 28.55 kg/m.  Generalized: Well developed, in no acute distress   Neurological examination  Mentation: Alert oriented to time, place, history taking. Follows all commands speech and language fluent Cranial nerve II-XII: Pupils were equal round reactive to light. Extraocular movements were full, visual field were full on confrontational test. Facial sensation and strength were normal. Uvula tongue midline. Head turning and shoulder shrug  were normal and symmetric. Motor: The motor testing reveals 5 over 5 strength of all 4 extremities with exception slightly weaker in the right lower extremity 4/5 strength.  Proximal weakness noted. Sensory: Sensory testing is intact to soft touch on all 4 extremities. No evidence of extinction is noted.  Coordination: Cerebellar testing reveals good  finger-nose-finger and heel-to-shin bilaterally.  Gait and station: Patient is able to stand without assistance.  Uses a cane when ambulating.  Without the cane in her right leg turns inward and was making her lose her balance.  tandem gait not attempted.     DIAGNOSTIC DATA (LABS, IMAGING, TESTING) - I reviewed patient records, labs, notes, testing and imaging myself where available.  Lab Results  Component Value Date   WBC 6.0 06/30/2022   HGB 12.4 06/30/2022   HCT 38.3 06/30/2022   MCV 84.3 06/30/2022   PLT 278.0 06/30/2022      Component Value Date/Time   NA  141 06/30/2022 1007   K 4.3 06/30/2022 1007   CL 102 06/30/2022 1007   CO2 33 (H) 06/30/2022 1007   GLUCOSE 91 06/30/2022 1007   BUN 15 06/30/2022 1007   CREATININE 0.69 06/30/2022 1007   CREATININE 0.77 04/28/2016 1552   CALCIUM  10.2 06/30/2022 1007   PROT 6.3 04/25/2022 1451   ALBUMIN 4.0 03/29/2022 1045   AST 18 03/29/2022 1045   ALT 15 03/29/2022 1045   ALKPHOS 84 03/29/2022 1045   BILITOT 0.3 03/29/2022 1045   GFRNONAA >60 10/31/2020 0205   GFRAA >60 11/02/2015 0524   Lab Results  Component Value Date   CHOL 147 08/14/2023   HDL 61 08/14/2023   LDLCALC 62 08/14/2023   LDLDIRECT 67.0 03/29/2022   TRIG 140 08/14/2023   CHOLHDL 2.4 08/14/2023   Lab Results  Component Value Date   HGBA1C 5.2 04/25/2022   Lab Results  Component Value Date   VITAMINB12 614 04/25/2022   Lab Results  Component Value Date   TSH 1.370 04/25/2022      ASSESSMENT AND PLAN 83 y.o. year old female  has a past medical history of Anemia, Breast cancer (HCC), Cerebrovascular disease (11/12/2018), Depression, Depression, GERD (gastroesophageal reflux disease), Hiatal hernia, History of GI bleed, mitral valve prolapse, Hyperlipidemia, Irregular heart beat, Neuropathy, Nocturnal leg cramps (09/02/2015), Optic neuritis, Osteopenia, Ovarian cyst, Parotid tumor, Pelvic fracture (HCC), Restless legs syndrome, Shingles (2000), Stroke  (HCC), and Syncopal episodes (06/28/2022). here with:  1.  Restless leg syndrome 2. .Peripheral neuropathy 3.  Back pain with sciatica 4. OSA on CPAP 5. Gait abnormality  - Continue on gabapentin  600 mg at bedtime - Patient continues to have trouble with her right leg and the lower back.  She had imaging in 2023 that I reviewed in epic. - CPAP compliance is suboptimal - Encouraged her to use the machine nightly and greater than 4 hours each night - Residual AHI is in normal range - Follow-up in 8 to 10 months or sooner if needed   Duwaine Russell, MSN, NP-C 01/28/2024, 4:38 PM [facility] [address], [address] [place], [place] [address] [phone]

## 2024-01-29 NOTE — Progress Notes (Signed)
 RE: mask refitting Received: Today New, Adine Neysa Nena GORMAN, RN; Joylene Carlean Sheree Leveda Jackson Avelina; Tucker, Dolanda; 1 other Received, thank you!     Previous Messages    ----- Message ----- From: Neysa Nena GORMAN, RN Sent: 01/29/2024   9:09 AM EDT To: Adine Joylene; Avelina Jackson; Ephraim Dollar* Subject: mask refitting                                New order in EPIC for mask refitting  [name]. Shelley Coleman Female, 83 y.o., 1940/06/21 MRN: [id number]   Thanks

## 2024-01-30 ENCOUNTER — Telehealth: Payer: Self-pay | Admitting: *Deleted

## 2024-01-30 NOTE — Telephone Encounter (Signed)
-----   Message from [facility] sent at 01/29/2024  9:45 AM EDT ----- Please see message below and let patient know. Dr. Onita did not feel that imaging would change treatment. See can certainly seek the opinion of her orthopedist as well.   Megan ----- Message ----- From: Onita Duos, MD Sent: 01/29/2024   9:20 AM EDT To: Duwaine Russell, NP  Repeat MRI lumbar would not change the treatment plan,  If there is no drastic changes, no need to repeat ----- Message ----- From: Russell Duwaine, NP Sent: 01/28/2024   4:42 PM EDT To: Duos Onita, MD  See my note about her back pain.  She had a MRI of the lumbar spine in 2023.  Did feel that there is any need to repeat imaging or any additional testing

## 2024-01-30 NOTE — Telephone Encounter (Signed)
 I called pt and relayed that per Dr. Onita that she would nt o  any imaging at this time as would not change treatment.  She can see orthopedist to for opinion.  Pt said she cannot walk , R side even with continued PT exercises and wants to know why.  I did reiterate to call orthopedist about appt.  She verbalized understanding.

## 2024-02-04 ENCOUNTER — Other Ambulatory Visit: Payer: Self-pay | Admitting: Adult Health

## 2024-02-04 DIAGNOSIS — I10 Essential (primary) hypertension: Secondary | ICD-10-CM

## 2024-02-04 DIAGNOSIS — F5101 Primary insomnia: Secondary | ICD-10-CM

## 2024-02-05 DIAGNOSIS — Z23 Encounter for immunization: Secondary | ICD-10-CM | POA: Diagnosis not present

## 2024-02-05 NOTE — Telephone Encounter (Signed)
 Okay for refill?

## 2024-03-05 DIAGNOSIS — D485 Neoplasm of uncertain behavior of skin: Secondary | ICD-10-CM | POA: Diagnosis not present

## 2024-03-05 DIAGNOSIS — L821 Other seborrheic keratosis: Secondary | ICD-10-CM | POA: Diagnosis not present

## 2024-03-05 DIAGNOSIS — L853 Xerosis cutis: Secondary | ICD-10-CM | POA: Diagnosis not present

## 2024-03-05 DIAGNOSIS — D1801 Hemangioma of skin and subcutaneous tissue: Secondary | ICD-10-CM | POA: Diagnosis not present

## 2024-03-05 DIAGNOSIS — Z85828 Personal history of other malignant neoplasm of skin: Secondary | ICD-10-CM | POA: Diagnosis not present

## 2024-03-05 DIAGNOSIS — D692 Other nonthrombocytopenic purpura: Secondary | ICD-10-CM | POA: Diagnosis not present

## 2024-03-05 DIAGNOSIS — L812 Freckles: Secondary | ICD-10-CM | POA: Diagnosis not present

## 2024-03-05 DIAGNOSIS — L570 Actinic keratosis: Secondary | ICD-10-CM | POA: Diagnosis not present

## 2024-03-05 DIAGNOSIS — C44712 Basal cell carcinoma of skin of right lower limb, including hip: Secondary | ICD-10-CM | POA: Diagnosis not present

## 2024-04-10 DIAGNOSIS — H43813 Vitreous degeneration, bilateral: Secondary | ICD-10-CM | POA: Diagnosis not present

## 2024-04-10 DIAGNOSIS — H353221 Exudative age-related macular degeneration, left eye, with active choroidal neovascularization: Secondary | ICD-10-CM | POA: Diagnosis not present

## 2024-04-10 DIAGNOSIS — Z961 Presence of intraocular lens: Secondary | ICD-10-CM | POA: Diagnosis not present

## 2024-04-10 DIAGNOSIS — H353112 Nonexudative age-related macular degeneration, right eye, intermediate dry stage: Secondary | ICD-10-CM | POA: Diagnosis not present

## 2024-04-30 ENCOUNTER — Encounter (HOSPITAL_BASED_OUTPATIENT_CLINIC_OR_DEPARTMENT_OTHER): Payer: Self-pay

## 2024-05-01 ENCOUNTER — Other Ambulatory Visit: Payer: Self-pay | Admitting: Adult Health

## 2024-05-01 DIAGNOSIS — I10 Essential (primary) hypertension: Secondary | ICD-10-CM

## 2024-05-02 ENCOUNTER — Encounter (HOSPITAL_BASED_OUTPATIENT_CLINIC_OR_DEPARTMENT_OTHER): Payer: Self-pay

## 2024-05-02 ENCOUNTER — Ambulatory Visit (HOSPITAL_BASED_OUTPATIENT_CLINIC_OR_DEPARTMENT_OTHER): Admitting: Cardiology

## 2024-05-02 ENCOUNTER — Encounter (HOSPITAL_BASED_OUTPATIENT_CLINIC_OR_DEPARTMENT_OTHER): Payer: Self-pay | Admitting: Cardiology

## 2024-05-02 VITALS — BP 122/76 | HR 59 | Ht 67.5 in | Wt 190.8 lb

## 2024-05-02 DIAGNOSIS — T466X5D Adverse effect of antihyperlipidemic and antiarteriosclerotic drugs, subsequent encounter: Secondary | ICD-10-CM | POA: Diagnosis not present

## 2024-05-02 DIAGNOSIS — Z8673 Personal history of transient ischemic attack (TIA), and cerebral infarction without residual deficits: Secondary | ICD-10-CM

## 2024-05-02 DIAGNOSIS — R600 Localized edema: Secondary | ICD-10-CM | POA: Diagnosis not present

## 2024-05-02 DIAGNOSIS — M791 Myalgia, unspecified site: Secondary | ICD-10-CM | POA: Diagnosis not present

## 2024-05-02 DIAGNOSIS — I10 Essential (primary) hypertension: Secondary | ICD-10-CM

## 2024-05-02 DIAGNOSIS — I25118 Atherosclerotic heart disease of native coronary artery with other forms of angina pectoris: Secondary | ICD-10-CM

## 2024-05-02 DIAGNOSIS — R011 Cardiac murmur, unspecified: Secondary | ICD-10-CM | POA: Diagnosis not present

## 2024-05-02 NOTE — Progress Notes (Signed)
 " Cardiology Office Note:  .    Date:  05/02/2024  ID:  Shelley Coleman, DOB 05/30/1940, MRN [id number] PCP: Merna Huxley, NP  [facility] Providers Cardiologist:  Shelda Bruckner, MD     History of Present Illness: .    Shelley Coleman is a 84 y.o. female with a hx of hypertension, hyperlipidemia, aortic atherosclerosis, prior CVA, history of GI bleed 10/2020, who is seen for follow-up. I initially met her 06/02/2021 as a new consult at the request of Merna Huxley, NP for the evaluation and management of dyspnea on exertion.   CV risk factors: Has a strong family history of CAD. Father had 74 MI's (first age 82) and 2 CVA. Both brothers have stents. Mother died of angiosarcoma at age 43.   Former Administrator, arts.  She had a cardiac/coronary CT 06/16/21, showing a coronary calcium  score of 440. And an aortic valve calcium  score of 153. There was concern for a significant stenosis in the mRCA, and this was borderline on FFR. Previously developed severe body aches and myalgias on statin therapy. Now tolerating Leqvio  (PCSK9i).  Today: Last seen by Rosaline Bane 07/2023, no further syncope reported. Today she feels overall fatigued. She feels tired just walking to the mailbox. Also notices that she is wheezing after walking, nearly constant, even walking in her house. She feels like her muscles are weak and like she might collapse when she is walking. Walks with a cane.   Reviewed her recent echo, aortic valve is stable, no clear cardiac etiology for her symptoms.  Feels like her legs are tight, intermittently swollen, usually right worse than the left. Tried compression stockings, which helped where the socks were but was swollen above the socks. Notes that swelling is better in the morning and worse in the evenings.  She is not sure when her chest pain is her hiatal hernia/ulcers and what is her heart. Happens rarely, sometimes with activity. Has taken nitroglycerin  only rarely but it  helps. Reviewed signs/symptoms, she knows when to seek emergency care.  ROS:  Denies shortness of breath at rest. No PND, orthopnea. No syncope or palpitations. ROS otherwise negative except as noted.   Studies Reviewed: SABRA         Physical Exam:    VS:  BP 122/76 (BP Location: Left Arm, Patient Position: Sitting, Cuff Size: Normal)   Pulse (!) 59   Ht 5' 7.5 (1.715 m)   Wt 190 lb 12.8 oz (86.5 kg)   SpO2 95%   BMI 29.44 kg/m    Wt Readings from Last 3 Encounters:  05/02/24 190 lb 12.8 oz (86.5 kg)  01/28/24 185 lb (83.9 kg)  01/18/24 186 lb 6.4 oz (84.6 kg)    GEN: Well nourished, well developed in no acute distress HEENT: Normal, moist mucous membranes NECK: No JVD CARDIAC: regular rhythm, normal S1 and S2, no rubs or gallops. 2/6 early peaking harsh systolic murmur. VASCULAR: Radial and DP pulses 2+ bilaterally. No carotid bruits RESPIRATORY:  Clear to auscultation without rales, wheezing or rhonchi. Some upper airway noise ABDOMEN: Soft, non-tender, non-distended MUSCULOSKELETAL:  Ambulates independently with a cane SKIN: Warm and dry, no edema currently NEUROLOGIC:  Alert and oriented x 3. No focal neuro deficits noted. PSYCHIATRIC:  Normal affect   ASSESSMENT AND PLAN: .    Fatigue, exercise intolerance, dyspnea on exertion -echo reassuring -has rare chest pain, but not at the same time as her symptoms above, which are nearly every time she walks -  we discussed possible etiologies. Anemia has been much improved. Does note wheezing on exertion, no known pulm history. Has difficulty with weakness/unsteadiness from her stroke history -reviewed signs/symptoms to watch for, will call with changes.  Coronary artery disease, with borderline significant stenosis in the mRCA History of CVA Hyperlipidemia Statin myalgia -had myalgia on statin, entire family has issues with them -on clopidogrel  -now on Leqvio  (PCSK9i) -rare chest pain, sometimes exertional, responds to  nitroglycerin . Discussed angina vs. GI, what to watch for -reviewed red flag warning signs that need immediate medical attention   Hypertension -continue atenolol  50 mg daily -has history of arrhythmia/PVC, reason atenolol  was initially started, but found to have thyroid  issues around the same time    Murmur -consistent with aortic sclerosis, only very mild stenosis. Most recent echo 09/10/23 with mean gradient 11.   Intermittent LE edema -RAP normal on echo, no clear cardiac etiology (only grade 1 diastolic dysfunction) -does improve somewhat with compression stockings. Discussed compression, elevation, salt avoidance. Suspect component of venous insufficiency  Dispo: Follow-up in 6 months, or sooner as needed.  Signed, Shelda Bruckner, MD   "

## 2024-05-02 NOTE — Patient Instructions (Signed)
 Medication Instructions:   Your physician recommends that you continue on your current medications as directed. Please refer to the Current Medication list given to you today.  *If you need a refill on your cardiac medications before your next appointment, please call your pharmacy*    Follow-Up: At [facility], you and your health needs are our priority.  As part of our continuing mission to provide you with exceptional heart care, our providers are all part of one team.  This team includes your primary Cardiologist (physician) and Advanced Practice Providers or APPs (Physician Assistants and Nurse Practitioners) who all work together to provide you with the care you need, when you need it.  Your next appointment:   6 month(s)  Provider:   Shelda Bruckner, MD, Rosaline Bane, NP, or Reche Finder, NP

## 2024-05-06 NOTE — Telephone Encounter (Signed)
 Patient need to schedule for more refills.

## 2024-05-09 ENCOUNTER — Telehealth: Payer: Self-pay

## 2024-05-09 NOTE — Telephone Encounter (Signed)
 Auth Submission: NO AUTH NEEDED Site of care: Site of care: [facility] Payer: Medicare A/B with AARP supplement Medication & CPT/J Code(s) submitted: Leqvio  (Inclisiran) J1306 Diagnosis Code:  Route of submission (phone, fax, portal):  Phone # Fax # Auth type: Buy/Bill PB Units/visits requested: 284mg  x 2 doses Reference number:  Approval from: 05/09/24 to 05/31/25

## 2024-05-22 ENCOUNTER — Telehealth: Payer: Self-pay | Admitting: *Deleted

## 2024-05-22 NOTE — Telephone Encounter (Signed)
 Copied from CRM #[id number]. Topic: General - Other >> May 22, 2024  2:48 PM Viola F wrote: Reason for CRM: Lonell from Dr. Suan Barthel office called regarding patient tooth extraction scheduled for Tuesday 05/27/24 and a clearance form was faxed 05/15/24 - they need to know the instructions for stopping and starting patients blood thinner prior to extraction. Please call her at [phone] or fax form back asap.

## 2024-05-23 NOTE — Telephone Encounter (Signed)
 Ppw received and faxed

## 2024-05-23 NOTE — Telephone Encounter (Signed)
PPW faxed with confirmation.

## 2024-05-27 ENCOUNTER — Other Ambulatory Visit: Payer: Self-pay | Admitting: Gastroenterology

## 2024-07-18 ENCOUNTER — Ambulatory Visit

## 2024-10-20 ENCOUNTER — Ambulatory Visit: Admitting: Adult Health
